# Patient Record
Sex: Female | Born: 1935 | Race: White | Hispanic: No | Smoking: Former smoker
Health system: Southern US, Community
[De-identification: ages and names within clinical notes are randomized; demographics above are authoritative.]

## PROBLEM LIST (undated history)

## (undated) DIAGNOSIS — F32A Depression, unspecified: Secondary | ICD-10-CM

## (undated) DIAGNOSIS — G43909 Migraine, unspecified, not intractable, without status migrainosus: Secondary | ICD-10-CM

## (undated) DIAGNOSIS — K5730 Diverticulosis of large intestine without perforation or abscess without bleeding: Secondary | ICD-10-CM

## (undated) DIAGNOSIS — R110 Nausea: Secondary | ICD-10-CM

## (undated) DIAGNOSIS — B880 Other acariasis: Secondary | ICD-10-CM

## (undated) DIAGNOSIS — F329 Major depressive disorder, single episode, unspecified: Secondary | ICD-10-CM

## (undated) DIAGNOSIS — I493 Ventricular premature depolarization: Secondary | ICD-10-CM

## (undated) DIAGNOSIS — H269 Unspecified cataract: Secondary | ICD-10-CM

## (undated) DIAGNOSIS — G609 Hereditary and idiopathic neuropathy, unspecified: Secondary | ICD-10-CM

## (undated) DIAGNOSIS — IMO0002 Reserved for concepts with insufficient information to code with codable children: Secondary | ICD-10-CM

## (undated) DIAGNOSIS — E669 Obesity, unspecified: Secondary | ICD-10-CM

## (undated) DIAGNOSIS — I4891 Unspecified atrial fibrillation: Secondary | ICD-10-CM

## (undated) DIAGNOSIS — M1710 Unilateral primary osteoarthritis, unspecified knee: Secondary | ICD-10-CM

## (undated) DIAGNOSIS — Z8711 Personal history of peptic ulcer disease: Secondary | ICD-10-CM

## (undated) DIAGNOSIS — R002 Palpitations: Secondary | ICD-10-CM

## (undated) DIAGNOSIS — I10 Essential (primary) hypertension: Secondary | ICD-10-CM

## (undated) DIAGNOSIS — G4700 Insomnia, unspecified: Secondary | ICD-10-CM

## (undated) DIAGNOSIS — R109 Unspecified abdominal pain: Secondary | ICD-10-CM

## (undated) DIAGNOSIS — N390 Urinary tract infection, site not specified: Secondary | ICD-10-CM

## (undated) DIAGNOSIS — K922 Gastrointestinal hemorrhage, unspecified: Secondary | ICD-10-CM

## (undated) DIAGNOSIS — M1990 Unspecified osteoarthritis, unspecified site: Secondary | ICD-10-CM

## (undated) DIAGNOSIS — R269 Unspecified abnormalities of gait and mobility: Secondary | ICD-10-CM

## (undated) DIAGNOSIS — R609 Edema, unspecified: Secondary | ICD-10-CM

## (undated) DIAGNOSIS — Z8719 Personal history of other diseases of the digestive system: Secondary | ICD-10-CM

## (undated) DIAGNOSIS — F1020 Alcohol dependence, uncomplicated: Secondary | ICD-10-CM

## (undated) HISTORY — DX: Insomnia, unspecified: G47.00

## (undated) HISTORY — DX: Edema, unspecified: R60.9

## (undated) HISTORY — DX: Gastrointestinal hemorrhage, unspecified: K92.2

## (undated) HISTORY — PX: OTHER SURGICAL HISTORY: SHX169

## (undated) HISTORY — DX: Unspecified abnormalities of gait and mobility: R26.9

## (undated) HISTORY — DX: Hereditary and idiopathic neuropathy, unspecified: G60.9

## (undated) HISTORY — DX: Palpitations: R00.2

## (undated) HISTORY — DX: Unilateral primary osteoarthritis, unspecified knee: M17.10

## (undated) HISTORY — DX: Obesity, unspecified: E66.9

## (undated) HISTORY — DX: Reserved for concepts with insufficient information to code with codable children: IMO0002

## (undated) HISTORY — DX: Depression, unspecified: F32.A

## (undated) HISTORY — DX: Major depressive disorder, single episode, unspecified: F32.9

## (undated) HISTORY — DX: Nausea: R11.0

## (undated) HISTORY — DX: Diverticulosis of large intestine without perforation or abscess without bleeding: K57.30

## (undated) HISTORY — PX: ARTHROSCOPIC REPAIR ACL: SUR80

## (undated) HISTORY — DX: Alcohol dependence, uncomplicated: F10.20

## (undated) HISTORY — DX: Unspecified abdominal pain: R10.9

---
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
## 1971-09-24 HISTORY — PX: BREAST BIOPSY: SHX20

## 2001-09-09 ENCOUNTER — Encounter: Admission: RE | Admit: 2001-09-09 | Discharge: 2001-09-09 | Payer: Self-pay | Admitting: Internal Medicine

## 2001-09-09 ENCOUNTER — Encounter: Payer: Self-pay | Admitting: Internal Medicine

## 2002-06-23 DIAGNOSIS — K922 Gastrointestinal hemorrhage, unspecified: Secondary | ICD-10-CM

## 2002-06-23 HISTORY — DX: Gastrointestinal hemorrhage, unspecified: K92.2

## 2002-07-17 ENCOUNTER — Inpatient Hospital Stay (HOSPITAL_COMMUNITY): Admission: EM | Admit: 2002-07-17 | Discharge: 2002-07-22 | Payer: Self-pay

## 2002-07-24 ENCOUNTER — Inpatient Hospital Stay (HOSPITAL_COMMUNITY): Admission: EM | Admit: 2002-07-24 | Discharge: 2002-07-27 | Payer: Self-pay | Admitting: Emergency Medicine

## 2002-07-27 ENCOUNTER — Encounter: Payer: Self-pay | Admitting: Cardiology

## 2002-10-14 ENCOUNTER — Encounter: Admission: RE | Admit: 2002-10-14 | Discharge: 2002-10-14 | Payer: Self-pay | Admitting: Internal Medicine

## 2002-10-14 ENCOUNTER — Encounter: Payer: Self-pay | Admitting: Internal Medicine

## 2002-10-21 ENCOUNTER — Ambulatory Visit (HOSPITAL_COMMUNITY): Admission: RE | Admit: 2002-10-21 | Discharge: 2002-10-21 | Payer: Self-pay | Admitting: Gastroenterology

## 2002-12-20 ENCOUNTER — Ambulatory Visit (HOSPITAL_COMMUNITY): Admission: RE | Admit: 2002-12-20 | Discharge: 2002-12-20 | Payer: Self-pay | Admitting: Specialist

## 2003-09-24 HISTORY — PX: CHOLECYSTECTOMY: SHX55

## 2003-11-01 ENCOUNTER — Encounter: Admission: RE | Admit: 2003-11-01 | Discharge: 2003-11-01 | Payer: Self-pay

## 2003-12-23 HISTORY — PX: BREAST REDUCTION SURGERY: SHX8

## 2004-10-11 ENCOUNTER — Observation Stay (HOSPITAL_COMMUNITY): Admission: RE | Admit: 2004-10-11 | Discharge: 2004-10-12 | Payer: Self-pay | Admitting: Surgery

## 2004-10-11 ENCOUNTER — Encounter (INDEPENDENT_AMBULATORY_CARE_PROVIDER_SITE_OTHER): Payer: Self-pay | Admitting: *Deleted

## 2004-11-01 ENCOUNTER — Encounter: Admission: RE | Admit: 2004-11-01 | Discharge: 2004-11-01 | Payer: Self-pay | Admitting: Surgery

## 2004-11-02 ENCOUNTER — Encounter: Admission: RE | Admit: 2004-11-02 | Discharge: 2004-11-02 | Payer: Self-pay | Admitting: Surgery

## 2004-12-21 ENCOUNTER — Encounter: Admission: RE | Admit: 2004-12-21 | Discharge: 2004-12-21 | Payer: Self-pay | Admitting: Internal Medicine

## 2005-11-04 ENCOUNTER — Encounter: Admission: RE | Admit: 2005-11-04 | Discharge: 2005-11-04 | Payer: Self-pay | Admitting: Family Medicine

## 2006-11-16 ENCOUNTER — Emergency Department (HOSPITAL_COMMUNITY): Admission: EM | Admit: 2006-11-16 | Discharge: 2006-11-16 | Payer: Self-pay | Admitting: Emergency Medicine

## 2008-10-07 ENCOUNTER — Encounter: Admission: RE | Admit: 2008-10-07 | Discharge: 2008-10-07 | Payer: Self-pay | Admitting: Family Medicine

## 2009-10-16 ENCOUNTER — Encounter: Admission: RE | Admit: 2009-10-16 | Discharge: 2009-10-16 | Payer: Self-pay | Admitting: Family Medicine

## 2010-11-14 ENCOUNTER — Other Ambulatory Visit: Payer: Self-pay | Admitting: Internal Medicine

## 2010-11-14 DIAGNOSIS — Z1231 Encounter for screening mammogram for malignant neoplasm of breast: Secondary | ICD-10-CM

## 2010-11-29 ENCOUNTER — Ambulatory Visit
Admission: RE | Admit: 2010-11-29 | Discharge: 2010-11-29 | Disposition: A | Discharge status: Home / Self Care | Payer: Medicare Other | Source: Ambulatory Visit | Attending: Internal Medicine | Admitting: Internal Medicine

## 2010-11-29 DIAGNOSIS — Z1231 Encounter for screening mammogram for malignant neoplasm of breast: Secondary | ICD-10-CM

## 2011-02-04 ENCOUNTER — Other Ambulatory Visit: Payer: Self-pay | Admitting: Otolaryngology

## 2011-02-04 ENCOUNTER — Ambulatory Visit
Admission: RE | Admit: 2011-02-04 | Discharge: 2011-02-04 | Disposition: A | Discharge status: Home / Self Care | Payer: Medicare Other | Source: Ambulatory Visit | Attending: Otolaryngology | Admitting: Otolaryngology

## 2011-02-04 DIAGNOSIS — R05 Cough: Secondary | ICD-10-CM

## 2011-02-08 NOTE — Discharge Summary (Signed)
NAMEHUMA, [name]                             ACCOUNT NO.:  [id number]   MEDICAL RECORD NO.:  [id number]                   PATIENT TYPE:  INP   LOCATION:  5739                                 FACILITY:  [facility]   PHYSICIAN:  Danise Edge, M.D.                DATE OF BIRTH:  [birth year]   DATE OF ADMISSION:  07/17/2002  DATE OF DISCHARGE:  07/22/2002                                 DISCHARGE SUMMARY   DISCHARGE DIAGNOSES:  1. Gastric bleeding due to a Dieulafoy lesion in the gastric body.  2. Multiple gastric ulcers (CLOtest negative) secondary to nonsteroidal anti-     inflammatory drugs.  3. Hypertension.   DISCHARGE MEDICATIONS:  1. Hydrochlorothiazide and Cardizem are on hold.  2. Mobic and aspirin were discontinued due to ulcer disease.  3. Protonix 40 mg each morning for 12 weeks.  4. Tylenol 100 mg q.6h. p.r.n. arthralgias.   FOLLOW-UP:  I will ask the patient to see me back in the office in two to  four weeks.   LABORATORY DATA:  Discharge hemoglobin 10.5 g, white blood cell count 7700,  platelet count normal.  Complete metabolic profile on admission normal  except for a serum albumin 3.3.  Helicobacter pylori screen negative.   HISTORY OF PRESENT ILLNESS:  The patient is a 77 year old female born  2032-11-17.  The patient's primary care physician is Thora Lance, M.D.,  with the [facility].   The patient was evaluated by Colen Darling. Aundria Rud, M.D., at the urgent medical  care center with upper gastrointestinal bleeding.  She was referred to the  [facility] emergency room for evaluation.   HOSPITAL COURSE:  The patient required a total of six units of packed red  blood cells during her hospitalization due to recurrent bleeding.  Her  multiple esophagogastroduodenoscopies have revealed two prepyloric antral  ulcers at low risk for rebleeding, a proximal gastric body ulcer at low risk  for rebleeding, and a Dieulafoy lesion in the distal gastric  body, which was  endoclipped to control bleeding.  CLOtest was negative for Helicobacter  pylori antral gastritis.   At discharge the patient is in stable medical condition.  She may require  reintroduction of her Cardizem and hydrochlorothiazide for hypertension, but  at present her blood pressure is normal.  She does perform home blood  pressure monitoring.  The patient will try Tylenol to control her chronic  low back pain but may require narcotics in the future.  I told her that the  risks of nonsteroidal anti-inflammatory drugs outweigh any benefits in her  case.                                               Danise Edge, M.D.  MJ/MEDQ  D:  07/22/2002  T:  07/22/2002  Job:  [id number]   cc:   Everardo All. Madilyn Fireman, M.D.  [address]  [place]  [place] [address]  Fax: [phone]   Adolph Pollack, M.D.  Fax: [phone]   Colen Darling. Charmayne Sheer., M.D.  [address]  [place]  [place] [address]  Fax: [phone]

## 2011-02-08 NOTE — Op Note (Signed)
NAMEANALUCIA, [name]                             ACCOUNT NO.:  [id number]   MEDICAL RECORD NO.:  [id number]                   PATIENT TYPE:  INP   LOCATION:  5739                                 FACILITY:  [facility]   PHYSICIAN:  Danise Edge, M.D.                DATE OF BIRTH:  May 29, 1933   DATE OF PROCEDURE:  07/21/2002  DATE OF DISCHARGE:                                 OPERATIVE REPORT   PROCEDURE PERFORMED:  Esophagogastroduodenoscopy with gastric biopsies.   ENDOSCOPIST:  Charolett Bumpers, M.D.   INDICATIONS FOR PROCEDURE:  The patient is a 77 year old female born 1933-07-31.  [name] has gastric bleeding of undetermined etiology.   PROCEDURE PERFORMED:  Esophagogastroduodenoscopy.   ENDOSCOPIST:  Charolett Bumpers, M.D.   PREMEDICATION:  Versed 12.5 mg and Demerol 100 mg.  The patient received 0.3  mg Romazicon and 0.4 mg Narcan when she developed hypoventilation with a  falling oxygen saturation and hypotension.  Her oxygen saturation fell to  approximately 70% before coming back to 95 to 100% after the administration  of Romazicon, Narcan and placing a 100% rebreathing mask.   DESCRIPTION OF PROCEDURE:  The patient was placed in the left lateral  decubitus position.  I administered intravenous Versed and Demerol to  achieve conscious sedation for the procedure.  Due to oversedation, Ms.  Kimoto required 0.3 mg Romazicon and 0.4 mg Narcan.   The Olympus gastroscope was passed through the posterior hypopharynx and the  proximal esophagus without difficulty.  The hypopharynx, larynx and vocal  cords appeared normal.   Esophagoscopy:  The proximal, mid and lower segments of the esophagus  appeared normal.   Gastroscopy:  Upon entering the stomach there is absolutely no blood in the  stomach.  Retroflex view of the gastric cardia and fundus was completely  normal.  There is a healing, nonbleeding ulcer in the proximal gastric body  on the posterior wall with  exudative base.  There is a probable Dieulafoy  lesion in the distal gastric body on the anterior wall which was endoclipped  a couple of days ago.  The Endoclip remained in place and a second Endoclip  was applied.  There are multiple erosions in the gastric antrum which are  not bleeding.  There are two healing prepyloric antral ulcers with exudative  bases.   Duodenoscopy:  The duodenal bulb, mid duodenum and distal duodenum appear  normal.    ASSESSMENT:  Gastric bleeding due to a probable Dieulafoy lesion in the  distal gastric body on the anterior wall which has been endoclipped twice to  achieve hemostasis.  Danise Edge, M.D.    MJ/MEDQ  D:  07/21/2002  T:  07/21/2002  Job:  [id number]

## 2011-02-08 NOTE — Op Note (Signed)
[name], [name]                             ACCOUNT NO.:  [id number]   MEDICAL RECORD NO.:  [id number]                   PATIENT TYPE:  INP   LOCATION:  2315                                 FACILITY:  [facility]   PHYSICIAN:  Danise Edge, M.D.                DATE OF BIRTH:  October 15, 1932   DATE OF PROCEDURE:  07/19/2002  DATE OF DISCHARGE:                                 OPERATIVE REPORT   PROCEDURE PERFORMED:  Emergency esophagogastroduodenoscopy.   ENDOSCOPIST:  Danise Edge, M.D.   INDICATIONS FOR PROCEDURE:  The patient is a 77 year old  female admitted to  the hospital July 17, 2002 with gastric bleeding, site undetermined.   PREMEDICATION:  Fentanyl 50 mcg and Versed 14 mg.   INSTRUMENT USED:  Olympus therapeutic gastroscope.   DESCRIPTION OF PROCEDURE:  After obtaining informed consent the patient was  placed in the left lateral decubitus position.  I administered intravenous  Versed and intravenous fentanyl to achieve conscious sedation for the  procedure.  The patient's blood pressure, oxygen saturation and cardiac  rhythm were monitored throughout the procedure and documented in the medical  record.   The Olympus gastroscope was passed through the posterior hypopharynx and  into the proximal esophagus without difficulty.  The hypopharynx, larynx and  vocal cords appeared normal.   Esophagoscopy:  The proximal, mid and lower segments of the esophagus  appeared normal.   Gastroscopy:  Upon entering the stomach the gastric body and fundus were  filled with blood and blood clots.  I was able to examine the gastric  antrum, which revealed two nonbleeding prepyloric antral ulcers with  exudative bases.  The pylorus appeared normal.   Duodenoscopy:  The duodenal bulb, mid duodenum and distal duodenum appeared  normal.   Gastric lavage:  Using the Ewald tube the stomach was lavaged with  approximately 5,000 cc of water in an attempt to try to clear the clots from  the stomach.  Following orogastric lavage the therapeutic gastroscope was  passed through the posterior hypopharynx down the esophagus and into the  stomach, again revealing a large amount of blood clots in the gastric fundus  making this area difficult to clear of any active bleeding.  The proximal  gastric body ulcer on the posterior wall was visualized and was not  bleeding.  There appeared to be a pigmented mucosal depression in the distal  gastric body on the greater curvature aspect. This would be compatible with  a possible Dieulafoy type of lesion.  It was injected with 1:10,000  epinephrine.  It was not an area to adequately Endoclip for hemostasis, but  I did Endoclip in the area of the apparent Dieulafoy for localization if she  should require surgery.   ASSESSMENT:  1. Stomach filled with blood and blood clots; unable to adequately visualize     the fundus.  2. Two nonbleeding prepyloric antral  ulcers with exudate and low risk of     bleeding.  3. Nonbleeding, proximal gastric body, posterior wall, endoclipped to mark     location; difficult area to Endoclip for hemostasis.  4. Questionable Dieulafoy type lesion, distal gastric body, greater     curvature aspect injected with epinephrine and Endoclipped to mark     location, but a difficult area to Endoclip for hemostasis.   PLAN:  1. Monitor in intensive care unit.  2. IV Protonix.  3. Transfuse two units of packed red blood cells.  4. Monitor CBC.  5. Surgical consultation.  6. If bleeding continues recommend surgical therapy.                                                 Danise Edge, M.D.    MJ/MEDQ  D:  07/19/2002  T:  07/19/2002  Job:  [id number]

## 2011-02-08 NOTE — Op Note (Signed)
   [name], [name]                             ACCOUNT NO.:  [id number]   MEDICAL RECORD NO.:  [id number]                   PATIENT TYPE:  AMB   LOCATION:  ENDO                                 FACILITY:  [facility]   PHYSICIAN:  Danise Edge, M.D.                DATE OF BIRTH:  13-Oct-1932   DATE OF PROCEDURE:  10/21/2002  DATE OF DISCHARGE:                                 OPERATIVE REPORT   PROCEDURE:  EGD.   INDICATIONS FOR PROCEDURE:  The patient is a 77 year old female.  In late  October and early November the patient required two hospitalizations to  manage bleeding gastric ulcers secondary to nonsteroidal anti-inflammatory  medication.  She has completed a course of proton pump inhibitor therapy and  is scheduled to undergo followup esophagogastroduodenoscopy to confirm  gastric ulcer healing.   ENDOSCOPIST:  Danise Edge, M.D.   PREMEDICATION:  Versed 7.5 mg, Demerol 50 mg.   ENDOSCOPE:  Olympus gastroscope.   PROCEDURE IN DETAIL:  After obtaining informed consent the patient was  placed in the left lateral decubitus position.  I administered intravenous  Demerol and intravenous Versed to achieve conscious sedation for the  procedure.  The patient's blood pressure, oxygen saturation and cardiac  rhythm were monitored throughout the procedure and documented in the medical  record.   The Olympus gastroscope was passed through the posterior hypopharynx and  proximal esophagus without difficulty.  The hypopharynx and larynx appeared  normal.  I did not visualize the vocal cords during this exam.   Esophagoscopy:  The proximal mid and lower segments of the esophagus appear  normal.   Gastroscopy:  Retroflexed view of the gastric cardia and fundus were normal.  The gastric body, antrum and pylorus appear normal.  The gastric ulcers have  healed.  There is an Endoclip in the distal gastric body greater curvature  aspect from her late November esophagogastroduodenoscopy to  manage bleeding.  This Endoclip was applied to a suspected Dieulafoy type lesion.   Duodenoscopy:  The duodenal bulb, mid duodenum and distal duodenum appeared  normal.   ASSESSMENT:  Normal esophagogastroduodenoscopy.  Endoclip remains in the  distal gastric body greater curvature aspect.  Healed gastric ulcers.   RECOMMENDATIONS:  Remain off nonsteroidal anti-inflammatory medication.                                              Danise Edge, M.D.     MJ/MEDQ  D:  10/21/2002  T:  10/21/2002  Job:  [id number]   cc:   Thora Lance, M.D.  [address]  [place]  [place] [address]  Fax: [phone]

## 2011-02-08 NOTE — H&P (Signed)
[name], [name]                             ACCOUNT NO.:  [id number]   MEDICAL RECORD NO.:  [id number]                   PATIENT TYPE:  INP   LOCATION:  4702                                 FACILITY:  [facility]   PHYSICIAN:  Althea Grimmer. Luther Parody, M.D.            DATE OF BIRTH:  [birth year]   DATE OF ADMISSION:  07/24/2002  DATE OF DISCHARGE:                                HISTORY & PHYSICAL   HISTORY OF PRESENT ILLNESS:  The patient is a 77 year old female just  discharged from the hospital two days ago after an episode of upper  gastrointestinal bleeding.  She had three endoscopies while hospitalized for  bleeding of unclear etiology.  Findings on the third endoscopy revealed that  she had a nonbleeding ulcer in the proximal gastric body on the posterior  wall.  There is also a probable Dieulafoy in the distal gastric body on the  anterior wall which had been Endoclipped and there were two prepyloric  antral ulcers, all lesions appeared to be healing.   At the third endoscopy, the Dieulafoy was reclipped, even though it did not  appear to be bleeding.  CLO-test was negative.  The patient had been taking  aspirin and Mobic.   She felt well on discharge with a discharge hemoglobin of 10.5.  This  morning, she had two loose black stools with lower abdominal cramping.  She  has had no vomiting.  She feels extremely lightheaded, like she will pass  out.   In the emergency room, she was not orthostatic but her pulse was tachycardic  and rose from 105 to 124 on standing.  The patient had a prior bleeding  duodenal ulcer in 1963, and prior to her admission on July 17, 2002, she  was on aspirin and Mobic.  She also has a history of colon polyps.  Apparently, baseline hemoglobin was over 17.   CURRENT MEDICATIONS:  Hydrochlorothiazide and Cardizem which have been on  hold.  Aspirin and Mobic have been discontinued.  Thus, she is taking  nothing other than Protonix daily.   ALLERGIES:   None reported.   PAST MEDICAL HISTORY:  Pertinent for arthritis, hypertension, colonic polyp,  and bleeding duodenal ulcer.   PAST SURGICAL HISTORY:  None.   FAMILY HISTORY:  Noncontributory.   SOCIAL HISTORY:  Former smoker. Minimal social drinker.  Attentive family.   REVIEW OF SYMPTOMS:  GENERAL:  No weight loss or night sweats.  ENDOCRINE:  No history of diabetes or thyroid problems.  SKIN:  No rash or pruritus.  EYES:  No icterus or change in vision.  ENT:  No aphthous, ulcers, or  chronic sore throat.  RESPIRATORY:  No shortness of breath, cough, or  wheezing.  CARDIAC:  No chest pain, palpitations, or history of valvular  heart disease.  GASTROINTESTINAL:  As above.  GENITOURINARY:  No dysuria or  hematuria.  Remainder of review of  systems is negative.   PHYSICAL EXAMINATION:  GENERAL:  She is a well-developed, well-nourished  adult female in no acute distress.  VITAL SIGNS:  Afebrile.  Pulse 110.  Blood pressure 105/80.  Afebrile.  SKIN:  Normal.  HEENT:  Eyes are anicteric.  Conjunctivae are somewhat pale.  Oropharynx  unremarkable.  NECK:  Supple without thy.  There is no cervical or inguinal adenopathy.  HEART:  Regular rate and rhythm without murmurs, rubs, or gallops.  Milt  tachycardia.  CHEST:  Clear.  ABDOMEN:  Soft without mass, tenderness, or organomegaly..  There is no  rebound or hernia.  Bowel sounds are normal.  RECTAL:  Not performed.  EXTREMITIES:  Without clubbing, cyanosis, or edema.   IMPRESSION:  Recurrent significant upper gastrointestinal bleeding in a 20-  year-old female whose hemoglobin is 6.7 in the face of melena.  She likely  has a recurrent bleeding from ulcer disease or the Dieulafoy.   PLAN:  Protonix drip and urgent endoscopy for attempted therapy is in order.  If active bleeding is still occurring or difficult to manage, surgical  consultation is in order.  Please see the orders.                                               Althea Grimmer. Luther Parody, M.D.    PJS/MEDQ  D:  07/24/2002  T:  07/25/2002  Job:  [id number]   cc:   Danise Edge, M.D.  [address]  [place]  [place] [address]  Fax: [phone]   Thora Lance, M.D.  [address] [place]  [place] [address]  Fax: [phone]

## 2011-02-08 NOTE — H&P (Signed)
   NAMECHELSEY, [name]                             ACCOUNT NO.:  [id number]   MEDICAL RECORD NO.:  [id number]                   PATIENT TYPE:  INP   LOCATION:  3703                                 FACILITY:  [facility]   PHYSICIAN:  Danise Edge, M.D.                DATE OF BIRTH:  [birth year]   DATE OF ADMISSION:  07/17/2002  DATE OF DISCHARGE:                                HISTORY & PHYSICAL   PROBLEM:  Upper gastrointestinal bleeding.   HISTORY OF PRESENT ILLNESS:  The patient is a 77 year old female born [birth year]. The patient's primary care physician is Dr. Kirby Funk with the  [facility].   The patient was  evaluated by Dr. Zenon Mayo at the [facility] with signs of upper gastrointestinal bleeding manifested by the  passage of melenic stool for approximately 48 hours associated with a  dyspeptic type abdominal discomfort.   In 1963, the patient was  hospitalized at [facility]  with a bleeding duodenal ulcer which required blood transfusions but no  surgery.   The patient takes daily aspirin and Mobic. She denies vomiting, hematemesis  or hematochezia.   Approximately three years ago she underwent a colonoscopy performed in [place], [place], and a colon polyp was removed.  The patient tells me she  had her hemoglobin checked last week and it was 17.5 gm.   MEDICATION ALLERGIES:  NONE.   CHRONIC MEDICATIONS:  1. Hydrochlorothiazide.  2. Aspirin 81 mg.  3. Mobic.  4. Cardizem.   PAST MEDICAL HISTORY:  1. Arthritis.  2. Hypertension.  3. Colonoscopy with polypectomy.  4. Bleeding duodenal ulcer in 1963.   PAST SURGICAL HISTORY:  None.   FAMILY HISTORY:  Noncontributory.   HABITS:  The patient quit smoking cigarettes years ago.   PHYSICAL EXAMINATION:  GENERAL APPEARANCE:  The patient is alert and appears  to be in no distress. VITAL SIGNS:  She does have a 20 mm systolic fall in  her blood  pressure going from supine to sitting position.  HEENT:  Sclerae nonicteric.  Oropharynx normal.  LUNGS:  Clear to auscultation.  CARDIAC:  Regular rhythm without  audible murmurs.  ABDOMEN:  Soft, flat and nontender.  EXTREMITIES:  No edema.   ASSESSMENT:  Upper gastrointestinal bleeding.   PLAN:  Emergency esophagogastroduodenoscopy.                                                Danise Edge, M.D.    MJ/MEDQ  D:  07/17/2002  T:  07/19/2002  Job:  [id number]

## 2011-02-08 NOTE — Discharge Summary (Signed)
   [name], [name]                             ACCOUNT NO.:  [id number]   MEDICAL RECORD NO.:  [id number]                   PATIENT TYPE:  INP   LOCATION:  2009                                 FACILITY:  [facility]   PHYSICIAN:  Danise Edge, M.D.                DATE OF BIRTH:  Dec 19, 1932   DATE OF ADMISSION:  07/24/2002  DATE OF DISCHARGE:  07/27/2002                                 DISCHARGE SUMMARY   DISCHARGE DIAGNOSIS:  Recurrent bleeding from gastric ulcer.   LABORATORY DATA:  Discharge white blood cell count 7,200, discharge  hemoglobin 10.1 grams, platelet count normal.  Prothrombin time normal.  Cardiac enzymes negative for myocardial infarction or myocardial injury.   HISTORY:  Ms. [name] is a 77 year old female born April 01, 1933.  Ms.  [name] was discharged from [facility] 48 hours prior to readmission  to manage gastric bleeding secondary to peptic ulcer disease.  She was  discharged in stable medical condition, only to be readmitted 48 hours post  discharge by Dr. Roosvelt Harps with recurrent upper gastrointestinal  bleeding from a visible vessel in one of her gastric ulcers which was  endoclipped.  Following Dr. Joanette Gula endoscopic therapy, Ms. [name] had  no further gastrointestinal bleeding.   She did have an episode of anterior chest pain during her hospitalization  which was evaluated by Dr. Viann Fish.  Her cardiac enzymes were  negative.  She did have an adenosine Cardiolite test performed prior to  discharge which revealed no ischemia.   Ms. [name] was discharged in stable medical condition, to be followed up by  me as an outpatient.  Her CLO test was negative for Helicobacter pylori  antral gastritis.  The etiology of her bleeding gastric ulcers was felt to  be due to nonsteroidal anti-inflammatory drugs.                                               Danise Edge, M.D.    MJ/MEDQ  D:  09/08/2002  T:  09/09/2002  Job:   [id number]

## 2011-02-08 NOTE — Op Note (Signed)
   [name], [name]                             ACCOUNT NO.:  [id number]   MEDICAL RECORD NO.:  [id number]                   PATIENT TYPE:  INP   LOCATION:  3703                                 FACILITY:  [facility]   PHYSICIAN:  Danise Edge, M.D.                DATE OF BIRTH:  23-Mar-1933   DATE OF PROCEDURE:  DATE OF DISCHARGE:                                 OPERATIVE REPORT   PROCEDURE:  Esophagogastroduodenoscopy.   After obtaining informed consent the patient was placed in the left lateral  decubitus position.  I administered 10 mg of intravenous Versed and 50 mg of  intravenous Demerol to achieve conscious sedation for the procedure.  The  patient's blood pressure, oxygen saturation and cardiac rhythm were  monitored throughout the procedure and documented in the medical record.   The Olympus gastroscope was passed through the posterior hypopharynx and the  proximal esophagus without difficulty.  The hypopharynx, larynx and focal  cords appeared normal.   Esophagoscopy:  The proximal, mid and lower segments of the esophagus  appeared normal.   Gastroscopy:  Upon entering the proximal stomach there is coffee ground  liquid and blood clots filling the gastric fundus.  The coffee ground liquid  could be evacuated, but I could not adequately remove the blood clots and  visualize the gastric fundus.  Retroflex view of the gastric cardia was  normal.  There is a 2 mm x 2 mm ulcer on the greater curvature aspect of the  gastric body, (posterior wall); the ulcer base is exudative and not  bleeding.  The remainder of the gastric body appeared normal.  There are  kissing ulcers in the prepyloric antrum.  Both ulcers measure approximately  2 mm x 2 mm.  Both ulcers are exudative and nonbleeding.  The pyloric  channel is normal.   Duodenoscopy:  The duodenal bulb, mid duodenum and distal duodenum appeared  normal.   Biopsy:  A biopsy was taken from the distal gastric antrum for CLO  test to  rule out Helicobacter pylori antral gastritis.   ASSESSMENT:  1. Upper gastrointestinal bleeding secondary to 2 prepyloric antral ulcers     and an ulcer in the proximal gastric body, posterior wall greater     curvature aspect.  Due to the presence of blood clots and the gastric     fundus I could not adequately visualize this area.                                               Danise Edge, M.D.    MJ/MEDQ  D:  07/17/2002  T:  07/19/2002  Job:  [id number]

## 2011-02-08 NOTE — Op Note (Signed)
NAMESTEFANIA, GOULART                 ACCOUNT NO.:  [id number]   MEDICAL RECORD NO.:  [id number]          PATIENT TYPE:  AMB   LOCATION:  DAY                          FACILITY:  [facility]   PHYSICIAN:  Velora Heckler, MD      DATE OF BIRTH:  [birth year]   DATE OF PROCEDURE:  10/11/2004  DATE OF DISCHARGE:                                 OPERATIVE REPORT   PREOPERATIVE DIAGNOSIS:  Symptomatic cholelithiasis.   POSTOPERATIVE DIAGNOSIS:  Chronic cholecystitis, cholelithiasis.   PROCEDURE:  Laparoscopic cholecystectomy with intraoperative  cholangiography.   SURGEON:  Velora Heckler, M.D.   ASSISTANTS:  Cyndia Bent, M.D.   ANESTHESIA:  General.   ESTIMATED BLOOD LOSS:  Minimal.   PREPARATION:  Betadine.   COMPLICATIONS:  None.   INDICATIONS:  Patient is a 77 year old white female who presents with a  history of intermittent upper abdominal discomfort, gaseous distention, and  nausea.  She had been evaluated at [facility] in [place] in  September, 2005.  She was found to have cholelithiasis.  She now comes to  surgery for cholecystectomy.   Procedure is done in OR #1 at [facility].  The  patient was brought to the operating room, placed in a supine position on  the operating room table.  Following administration of general anesthesia,  the patient was prepped and draped in the usual strict aseptic fashion.  After ascertaining that an adequate level of anesthesia had been obtained,  an infraumbilical incision was made with a #15 blade.  Dissection was  carried down to the fascia.  Fascia was incised in the midline and the  peritoneal cavity is entered cautiously.  A 0 Vicryl pursestring suture is  placed in the fascia.  A Hasson cannula is introduced and secured with a  pursestring suture.  The abdomen was insufflated with carbon dioxide.  Laparoscope was introduced and the abdomen explored.  Operative ports were  placed along the right costal  margin in the midline, midclavicular line, and  anterior axillary line.  Fundus of the gallbladder was grasped and retracted  cephalad.  There are dense adhesions of the omentum and duodenum to the  undersurface of the gallbladder.  These were gently taken down and  hemostasis obtained with the electrocautery.  Neck of the gallbladder is  exposed.  Peritoneum is incised.  Cystic duct is dissected out along its  length and a clip is placed at the neck of the gallbladder.  Cystic duct is  incised and clear yellow bile emanates from the cystic duct.  A Reddick  cholangiography catheter is introduced through an Angiocath into the right  upper quadrant.  It is inserted into the cystic duct and the balloon  inflated.  Using C-arm fluoroscopy, real time cholangiography is performed.  There is rapid flow of contrast distally in the common bile duct through the  duodenum without filling defect or obstruction.  Upon deflating the ballon  there was reflux of contrast into the common hepatic duct.  However, there  is also leakage out a long cystic duct.  The cholangiogram is discontinued  at this point.  Catheter is withdrawn.  Cystic duct is triply clipped and  divided.  Branches of the cystic artery are divided between Ligaclips.  Gallbladder is then excised from the gallbladder bed using the hook  electrocautery for hemostasis.  Gallbladder is completely extracted through  the umbilical port without difficulty.  It contains large gallstones.  Good  hemostasis is noted in the right upper quadrant.  The abdomen is irrigated  with warm saline which is evacuated.  Pneumoperitoneum is released and ports  are removed under direct vision.  Pursestring sutures of 0 Vicryl tied  securely at the umbilicus.  All wounds are anesthetized with local  anesthetic.  All wounds are closed with interrupted 4-0 Vicryl subcuticular  sutures.  Wounds are washed and dried and Benzoin and Steri-Strips are  applied.   Sterile dressings are applied.  The patient is awakened from  anesthesia and brought to the recovery room in stable condition.  Patient  tolerated the procedure well.     Todd   TMG/MEDQ  D:  10/11/2004  T:  10/11/2004  Job:  [id number]   cc:   Velora Heckler, MD  [address] [place]  [place] [address]  Fax: [phone]   Sheran Fava. Arva Chafe, M.D.  [address]  [place], [place] [address]   Thora Lance, M.D.  [address] [place]  [place] [address]  Fax: [phone]   Danise Edge, M.D.  [address]  [place]  [place] [address]  Fax: [phone]

## 2011-02-08 NOTE — Consult Note (Signed)
NAMESHEREDA, GRAW                             ACCOUNT NO.:  [id number]   MEDICAL RECORD NO.:  [id number]                   PATIENT TYPE:  INP   LOCATION:  2009                                 FACILITY:  [facility]   PHYSICIAN:  W. Ashley Royalty., M.D.         DATE OF BIRTH:  1933-04-10   DATE OF CONSULTATION:  07/26/2002  DATE OF DISCHARGE:                                   CONSULTATION   Thank you for asking me to see this very nice 77 year old female for  cardiologist opinion regarding chest discomfort.  The patient has a prior  history of hypertension and has had recent evaluation of gastrointestinal  bleeding.  She developed GI bleeding on July 15, 2002, and was  hospitalized here on October 25.  She has undergone a total of three  endoscopies and has been found to have gastric ulcers thought due to aspirin  and Mobic.  She was discharged home, but was readmitted with severe fatigue,  malaise, and further melena on July 24, 2002.  She required additional  transfusions over the weekend.  Last evening, she was standing up and felt a  feeling of impended doom, lightheadedness, dizziness, and developed anterior  chest pressure as someone sitting on her chest.  She also had palpitations  and felt as if she had PVCs.  She had an EKG showing no acute changes and  was given a total of two nitroglycerin which resulted in relief of her  symptoms, but she developed flushing and weakness associated with it. She  developed a significant headache following this.  Dr. Laural Benes asked me to  see her today and she has not had any recurrence of chest discomfort or  symptoms.   PAST MEDICAL HISTORY:  Remarkable for hypertension for several years. There  is no history of diabetes. She states that her cholesterol is normal.  Previous surgery; arthroscopy of the left knee in 1995, breast biopsy in  1973, and tonsillectomy in 1941.  Colonoscopy in 2001.   ALLERGIES:  No known drug allergies.   MEDICATIONS:  1. Protonix 40 daily.  2. Zoloft 50 daily.  3. Cardizem LA 420 daily.  4. Hydrochlorothiazide 25 daily.  5. Tylenol p.r.n.   Currently is on IV Protonix, she is not receiving an Diltiazem.   FAMILY HISTORY:  Father died of congestive heart failure in his 73s. Mother  died of breast cancer in her 3s. Sister died accidentally at age 56.  There  is no history of premature cardiac disease.   SOCIAL HISTORY:  She is a retired Engineer, civil (consulting). She is divorced once, but has been  married to her current husband since 75.  He is currently on disability  with pure autonomic failure and they currently live at [place].  Formerly used to spend the winter in [place] and the summer in [place].  Currently, does try to make it home to [facility], but  now spends the  winter at [place].  She smoked and quit 17 years ago. She drinks two to  three alcoholic drinks per day when she does not drive. She has two step  children, no children by her current marriage.   REVIEW OF SYSTEMS:  She has arthritis that involves her knees and her back.  There are no genitourinary symptoms, and no edema. She gets little exercise.  There is no exertional angina.  There are no eyes, ears, nose, or throat  problems, no pulmonary problems, and no definite skin problems. She has been  obese for several years. She does have a prior history of gastric ulcers and  bleeding ulcers in 1963.  Other than as noted above, the review of systems  is unremarkable.   PHYSICAL EXAMINATION:  GENERAL:  She is an obese woman who is crying when I  walked into the room to meet her.  VITAL SIGNS:  Blood pressure 140/80, pulse currently 76 and regular.  SKIN:  Warm and dry.  HEENT:  No JVD, thyromegaly, mass, or bruits.  LUNGS:  Clear to A&P.  HEART:  Normal S1 and S2. There is a 1/6 systolic murmur at the aortic area  radiating to the carotids.  ABDOMEN:  Soft and nontender with no mass.  EXTREMITIES:  Femoral pulses  are 2+, peripheral pulses 2 to 3+, no edema is  noted.   12-lead EKG shows sinus rhythm with minor nonspecific ST and T wave  abnormality.   LABORATORY DATA:  Hemoglobin 6.7 on admission, hemoglobin is currently 9.9.   IMPRESSION:  1. Isolated episode of chest discomfort occurring at rest.  With the     instrumentation that she has had of her esophagus, it is possible that     this could have been an isolated episode of esophageal spasm or     conversely could have been ischemic cardiac pain.  2. Bleeding gastric ulcers thought due to nonsteroid anti-inflammatory     agents and aspirin.  3. Hypertension not currently treated.  4. Significant situational stress.   RECOMMENDATIONS:  Withhold aspirin and nonsteroid anti-inflammatory agents  at the present time. Obtain serial cardiac enzymes with CPK and troponin.  Since she has been off of Diltiazem, restart the Diltiazem and check another  EKG.  If she does not have significant cardiac enzymes elevation, I would  suggest that she have an Adenosine Cardiolite scan prior to discharge to  screen for ischemic heart disease.   Thank you for asking me to see this nice woman with you.                                               Darden Palmer., M.D.    WST/MEDQ  D:  07/26/2002  T:  07/26/2002  Job:  [id number]   cc:   Danise Edge, M.D.  [address]  [place]  [place] [address]  Fax: [phone]   Thora Lance, M.D.  [address] [place]  [place] [address]  Fax: [phone]

## 2011-05-31 DIAGNOSIS — M1710 Unilateral primary osteoarthritis, unspecified knee: Secondary | ICD-10-CM

## 2011-05-31 HISTORY — DX: Unilateral primary osteoarthritis, unspecified knee: M17.10

## 2011-10-29 ENCOUNTER — Other Ambulatory Visit: Payer: Self-pay | Admitting: Family Medicine

## 2011-10-29 DIAGNOSIS — Z803 Family history of malignant neoplasm of breast: Secondary | ICD-10-CM

## 2011-10-29 DIAGNOSIS — Z1231 Encounter for screening mammogram for malignant neoplasm of breast: Secondary | ICD-10-CM

## 2011-11-25 DIAGNOSIS — G43809 Other migraine, not intractable, without status migrainosus: Secondary | ICD-10-CM | POA: Diagnosis not present

## 2011-12-02 ENCOUNTER — Telehealth: Payer: Self-pay | Admitting: Internal Medicine

## 2011-12-02 ENCOUNTER — Ambulatory Visit
Admission: RE | Admit: 2011-12-02 | Discharge: 2011-12-02 | Disposition: A | Discharge status: Home / Self Care | Payer: Medicare Other | Source: Ambulatory Visit | Attending: Family Medicine | Admitting: Family Medicine

## 2011-12-02 DIAGNOSIS — Z803 Family history of malignant neoplasm of breast: Secondary | ICD-10-CM

## 2011-12-02 DIAGNOSIS — Z1231 Encounter for screening mammogram for malignant neoplasm of breast: Secondary | ICD-10-CM | POA: Diagnosis not present

## 2011-12-02 NOTE — Telephone Encounter (Signed)
Thank you for considering me as a physician. Unfortunately, [facility] has closed my practice &  I have no option to change this. Other physicians in  at various sites are accepting new patients. Until their practices are full; I am not allowed to add new patients. 

## 2011-12-02 NOTE — Telephone Encounter (Signed)
Dr.Hopper please advise 

## 2011-12-02 NOTE — Telephone Encounter (Signed)
Patient called & stated her doctor Digby to hopper. She states that Dr. Hazle Quant told her he would take her on even though he is not accepting new patients. I advised her he was not take ANY new patients but she wanted him to review her name & be sure.  I can call her back ph# [phone], if you could please check with Dr. Alwyn Ren Thanks

## 2012-01-23 DIAGNOSIS — M1710 Unilateral primary osteoarthritis, unspecified knee: Secondary | ICD-10-CM | POA: Diagnosis not present

## 2012-02-23 DIAGNOSIS — I4891 Unspecified atrial fibrillation: Secondary | ICD-10-CM | POA: Diagnosis not present

## 2012-02-23 DIAGNOSIS — R0602 Shortness of breath: Secondary | ICD-10-CM | POA: Diagnosis not present

## 2012-02-26 DIAGNOSIS — L820 Inflamed seborrheic keratosis: Secondary | ICD-10-CM | POA: Diagnosis not present

## 2012-02-26 DIAGNOSIS — D485 Neoplasm of uncertain behavior of skin: Secondary | ICD-10-CM | POA: Diagnosis not present

## 2012-02-26 DIAGNOSIS — Z85828 Personal history of other malignant neoplasm of skin: Secondary | ICD-10-CM | POA: Diagnosis not present

## 2012-03-09 DIAGNOSIS — I714 Abdominal aortic aneurysm, without rupture: Secondary | ICD-10-CM | POA: Diagnosis not present

## 2012-03-09 DIAGNOSIS — I359 Nonrheumatic aortic valve disorder, unspecified: Secondary | ICD-10-CM | POA: Diagnosis not present

## 2012-03-09 DIAGNOSIS — I4891 Unspecified atrial fibrillation: Secondary | ICD-10-CM | POA: Diagnosis not present

## 2012-03-09 DIAGNOSIS — E785 Hyperlipidemia, unspecified: Secondary | ICD-10-CM | POA: Diagnosis not present

## 2012-03-12 DIAGNOSIS — D649 Anemia, unspecified: Secondary | ICD-10-CM | POA: Diagnosis not present

## 2012-03-12 DIAGNOSIS — L820 Inflamed seborrheic keratosis: Secondary | ICD-10-CM | POA: Diagnosis not present

## 2012-03-12 DIAGNOSIS — D485 Neoplasm of uncertain behavior of skin: Secondary | ICD-10-CM | POA: Diagnosis not present

## 2012-03-12 DIAGNOSIS — Z872 Personal history of diseases of the skin and subcutaneous tissue: Secondary | ICD-10-CM | POA: Diagnosis not present

## 2012-03-12 DIAGNOSIS — Z85828 Personal history of other malignant neoplasm of skin: Secondary | ICD-10-CM | POA: Diagnosis not present

## 2012-03-19 DIAGNOSIS — I10 Essential (primary) hypertension: Secondary | ICD-10-CM | POA: Diagnosis not present

## 2012-03-19 DIAGNOSIS — G4700 Insomnia, unspecified: Secondary | ICD-10-CM | POA: Diagnosis not present

## 2012-03-19 DIAGNOSIS — I4891 Unspecified atrial fibrillation: Secondary | ICD-10-CM | POA: Diagnosis not present

## 2012-03-30 DIAGNOSIS — R5381 Other malaise: Secondary | ICD-10-CM | POA: Diagnosis not present

## 2012-03-30 DIAGNOSIS — R5383 Other fatigue: Secondary | ICD-10-CM | POA: Diagnosis not present

## 2012-03-30 DIAGNOSIS — R110 Nausea: Secondary | ICD-10-CM | POA: Diagnosis not present

## 2012-03-30 DIAGNOSIS — I10 Essential (primary) hypertension: Secondary | ICD-10-CM | POA: Diagnosis not present

## 2012-03-30 DIAGNOSIS — I4891 Unspecified atrial fibrillation: Secondary | ICD-10-CM | POA: Diagnosis not present

## 2012-05-18 DIAGNOSIS — R609 Edema, unspecified: Secondary | ICD-10-CM | POA: Diagnosis not present

## 2012-05-18 DIAGNOSIS — I10 Essential (primary) hypertension: Secondary | ICD-10-CM | POA: Diagnosis not present

## 2012-05-18 DIAGNOSIS — I4891 Unspecified atrial fibrillation: Secondary | ICD-10-CM | POA: Diagnosis not present

## 2012-05-27 DIAGNOSIS — M25569 Pain in unspecified knee: Secondary | ICD-10-CM | POA: Diagnosis not present

## 2012-05-27 DIAGNOSIS — M1710 Unilateral primary osteoarthritis, unspecified knee: Secondary | ICD-10-CM | POA: Diagnosis not present

## 2012-06-03 DIAGNOSIS — I4891 Unspecified atrial fibrillation: Secondary | ICD-10-CM | POA: Diagnosis not present

## 2012-06-03 DIAGNOSIS — I2510 Atherosclerotic heart disease of native coronary artery without angina pectoris: Secondary | ICD-10-CM | POA: Diagnosis not present

## 2012-06-03 DIAGNOSIS — I739 Peripheral vascular disease, unspecified: Secondary | ICD-10-CM | POA: Diagnosis not present

## 2012-06-03 DIAGNOSIS — I10 Essential (primary) hypertension: Secondary | ICD-10-CM | POA: Diagnosis not present

## 2012-06-03 DIAGNOSIS — I7090 Unspecified atherosclerosis: Secondary | ICD-10-CM | POA: Diagnosis not present

## 2012-06-03 DIAGNOSIS — R609 Edema, unspecified: Secondary | ICD-10-CM | POA: Diagnosis not present

## 2012-06-08 DIAGNOSIS — E785 Hyperlipidemia, unspecified: Secondary | ICD-10-CM | POA: Diagnosis not present

## 2012-06-08 DIAGNOSIS — I4891 Unspecified atrial fibrillation: Secondary | ICD-10-CM | POA: Diagnosis not present

## 2012-06-08 DIAGNOSIS — I10 Essential (primary) hypertension: Secondary | ICD-10-CM | POA: Diagnosis not present

## 2012-06-08 DIAGNOSIS — Z79899 Other long term (current) drug therapy: Secondary | ICD-10-CM | POA: Diagnosis not present

## 2012-06-22 DIAGNOSIS — I10 Essential (primary) hypertension: Secondary | ICD-10-CM | POA: Diagnosis not present

## 2012-06-22 DIAGNOSIS — I359 Nonrheumatic aortic valve disorder, unspecified: Secondary | ICD-10-CM | POA: Diagnosis not present

## 2012-06-22 DIAGNOSIS — I4891 Unspecified atrial fibrillation: Secondary | ICD-10-CM | POA: Diagnosis not present

## 2012-06-24 DIAGNOSIS — I4891 Unspecified atrial fibrillation: Secondary | ICD-10-CM | POA: Diagnosis not present

## 2012-06-24 DIAGNOSIS — G4700 Insomnia, unspecified: Secondary | ICD-10-CM | POA: Diagnosis not present

## 2012-06-24 DIAGNOSIS — Z23 Encounter for immunization: Secondary | ICD-10-CM | POA: Diagnosis not present

## 2012-06-24 DIAGNOSIS — I10 Essential (primary) hypertension: Secondary | ICD-10-CM | POA: Diagnosis not present

## 2012-06-24 DIAGNOSIS — Z1211 Encounter for screening for malignant neoplasm of colon: Secondary | ICD-10-CM | POA: Diagnosis not present

## 2012-08-17 ENCOUNTER — Other Ambulatory Visit: Payer: Self-pay | Admitting: Internal Medicine

## 2012-08-17 ENCOUNTER — Ambulatory Visit
Admission: RE | Admit: 2012-08-17 | Discharge: 2012-08-17 | Disposition: A | Discharge status: Home / Self Care | Payer: Medicare Other | Source: Ambulatory Visit | Attending: Internal Medicine | Admitting: Internal Medicine

## 2012-08-17 DIAGNOSIS — R109 Unspecified abdominal pain: Secondary | ICD-10-CM

## 2012-09-09 DIAGNOSIS — M76899 Other specified enthesopathies of unspecified lower limb, excluding foot: Secondary | ICD-10-CM | POA: Diagnosis not present

## 2012-09-09 DIAGNOSIS — M1710 Unilateral primary osteoarthritis, unspecified knee: Secondary | ICD-10-CM | POA: Diagnosis not present

## 2012-10-01 DIAGNOSIS — H04129 Dry eye syndrome of unspecified lacrimal gland: Secondary | ICD-10-CM | POA: Diagnosis not present

## 2012-10-08 ENCOUNTER — Encounter (HOSPITAL_COMMUNITY): Payer: Self-pay | Admitting: Emergency Medicine

## 2012-10-08 ENCOUNTER — Emergency Department (HOSPITAL_COMMUNITY): Payer: Medicare Other

## 2012-10-08 ENCOUNTER — Inpatient Hospital Stay (HOSPITAL_COMMUNITY)
Admission: EM | Admit: 2012-10-08 | Discharge: 2012-10-10 | DRG: 641 | Disposition: A | Discharge status: Home / Self Care | Payer: Medicare Other | Attending: Internal Medicine | Admitting: Internal Medicine

## 2012-10-08 DIAGNOSIS — R0989 Other specified symptoms and signs involving the circulatory and respiratory systems: Secondary | ICD-10-CM | POA: Diagnosis not present

## 2012-10-08 DIAGNOSIS — I4949 Other premature depolarization: Secondary | ICD-10-CM | POA: Diagnosis present

## 2012-10-08 DIAGNOSIS — I499 Cardiac arrhythmia, unspecified: Secondary | ICD-10-CM | POA: Diagnosis not present

## 2012-10-08 DIAGNOSIS — M129 Arthropathy, unspecified: Secondary | ICD-10-CM | POA: Diagnosis present

## 2012-10-08 DIAGNOSIS — Z87891 Personal history of nicotine dependence: Secondary | ICD-10-CM

## 2012-10-08 DIAGNOSIS — E860 Dehydration: Principal | ICD-10-CM | POA: Diagnosis present

## 2012-10-08 DIAGNOSIS — G609 Hereditary and idiopathic neuropathy, unspecified: Secondary | ICD-10-CM | POA: Diagnosis present

## 2012-10-08 DIAGNOSIS — I4891 Unspecified atrial fibrillation: Secondary | ICD-10-CM | POA: Diagnosis not present

## 2012-10-08 DIAGNOSIS — R0602 Shortness of breath: Secondary | ICD-10-CM | POA: Diagnosis not present

## 2012-10-08 DIAGNOSIS — Z7901 Long term (current) use of anticoagulants: Secondary | ICD-10-CM

## 2012-10-08 DIAGNOSIS — H269 Unspecified cataract: Secondary | ICD-10-CM | POA: Diagnosis present

## 2012-10-08 DIAGNOSIS — Z888 Allergy status to other drugs, medicaments and biological substances status: Secondary | ICD-10-CM

## 2012-10-08 DIAGNOSIS — Z79899 Other long term (current) drug therapy: Secondary | ICD-10-CM

## 2012-10-08 DIAGNOSIS — F3289 Other specified depressive episodes: Secondary | ICD-10-CM | POA: Diagnosis present

## 2012-10-08 DIAGNOSIS — Z8711 Personal history of peptic ulcer disease: Secondary | ICD-10-CM

## 2012-10-08 DIAGNOSIS — I10 Essential (primary) hypertension: Secondary | ICD-10-CM | POA: Diagnosis present

## 2012-10-08 DIAGNOSIS — F411 Generalized anxiety disorder: Secondary | ICD-10-CM | POA: Diagnosis present

## 2012-10-08 DIAGNOSIS — F1010 Alcohol abuse, uncomplicated: Secondary | ICD-10-CM

## 2012-10-08 DIAGNOSIS — R0609 Other forms of dyspnea: Secondary | ICD-10-CM | POA: Diagnosis not present

## 2012-10-08 DIAGNOSIS — Z9119 Patient's noncompliance with other medical treatment and regimen: Secondary | ICD-10-CM

## 2012-10-08 DIAGNOSIS — Z91199 Patient's noncompliance with other medical treatment and regimen due to unspecified reason: Secondary | ICD-10-CM

## 2012-10-08 DIAGNOSIS — F329 Major depressive disorder, single episode, unspecified: Secondary | ICD-10-CM | POA: Diagnosis present

## 2012-10-08 HISTORY — DX: Personal history of other diseases of the digestive system: Z87.19

## 2012-10-08 HISTORY — DX: Essential (primary) hypertension: I10

## 2012-10-08 HISTORY — DX: Unspecified osteoarthritis, unspecified site: M19.90

## 2012-10-08 HISTORY — DX: Other acariasis: B88.0

## 2012-10-08 HISTORY — DX: Ventricular premature depolarization: I49.3

## 2012-10-08 HISTORY — DX: Gastrointestinal hemorrhage, unspecified: K92.2

## 2012-10-08 HISTORY — DX: Personal history of peptic ulcer disease: Z87.11

## 2012-10-08 HISTORY — DX: Migraine, unspecified, not intractable, without status migrainosus: G43.909

## 2012-10-08 HISTORY — DX: Unspecified cataract: H26.9

## 2012-10-08 HISTORY — DX: Urinary tract infection, site not specified: N39.0

## 2012-10-08 HISTORY — DX: Unspecified atrial fibrillation: I48.91

## 2012-10-08 LAB — POCT I-STAT TROPONIN I: Troponin i, poc: 0.02 ng/mL (ref 0.00–0.08)

## 2012-10-08 LAB — POCT I-STAT, CHEM 8
BUN: 19 mg/dL (ref 6–23)
Calcium, Ion: 1.07 mmol/L — ABNORMAL LOW (ref 1.13–1.30)
Chloride: 102 mEq/L (ref 96–112)
HCT: 47 % — ABNORMAL HIGH (ref 36.0–46.0)
Potassium: 3.6 mEq/L (ref 3.5–5.1)

## 2012-10-08 LAB — CBC
MCV: 93.4 fL (ref 78.0–100.0)
Platelets: 194 10*3/uL (ref 150–400)
RBC: 4.67 MIL/uL (ref 3.87–5.11)
WBC: 6.8 10*3/uL (ref 4.0–10.5)

## 2012-10-08 MED ORDER — METOPROLOL TARTRATE 25 MG PO TABS
25.0000 mg | ORAL_TABLET | Freq: Two times a day (BID) | ORAL | Status: DC
  Administered 2012-10-08 – 2012-10-10 (×4): 25 mg via ORAL
  Filled 2012-10-08 (×9): qty 1

## 2012-10-08 MED ORDER — POLYETHYLENE GLYCOL 3350 17 G PO PACK
17.0000 g | PACK | Freq: Every day | ORAL | Status: DC | PRN
  Filled 2012-10-08: qty 1

## 2012-10-08 MED ORDER — DILTIAZEM HCL 25 MG/5ML IV SOLN
10.0000 mg | Freq: Once | INTRAVENOUS | Status: AC
  Administered 2012-10-08: 10 mg via INTRAVENOUS
  Filled 2012-10-08: qty 5

## 2012-10-08 MED ORDER — ACETAMINOPHEN 650 MG RE SUPP: 650.0000 mg | Freq: Four times a day (QID) | RECTAL | Status: DC | PRN

## 2012-10-08 MED ORDER — ASPIRIN EC 81 MG PO TBEC
81.0000 mg | DELAYED_RELEASE_TABLET | Freq: Every day | ORAL | Status: DC
  Administered 2012-10-09 – 2012-10-10 (×2): 81 mg via ORAL
  Filled 2012-10-08 (×4): qty 1

## 2012-10-08 MED ORDER — LIP MEDEX EX OINT
TOPICAL_OINTMENT | CUTANEOUS | Status: DC | PRN
  Filled 2012-10-08 (×2): qty 7

## 2012-10-08 MED ORDER — LORAZEPAM 2 MG/ML IJ SOLN: 1.0000 mg | Freq: Four times a day (QID) | INTRAMUSCULAR | Status: DC | PRN

## 2012-10-08 MED ORDER — SODIUM CHLORIDE 0.9 % IV BOLUS (SEPSIS)
500.0000 mL | Freq: Once | INTRAVENOUS | Status: AC
  Administered 2012-10-08: 500 mL via INTRAVENOUS

## 2012-10-08 MED ORDER — FOLIC ACID 1 MG PO TABS
1.0000 mg | ORAL_TABLET | Freq: Every day | ORAL | Status: DC
  Administered 2012-10-08 – 2012-10-10 (×3): 1 mg via ORAL
  Filled 2012-10-08 (×3): qty 1

## 2012-10-08 MED ORDER — DILTIAZEM HCL 25 MG/5ML IV SOLN
10.0000 mg | Freq: Once | INTRAVENOUS | Status: AC
  Administered 2012-10-08: 10 mg via INTRAVENOUS

## 2012-10-08 MED ORDER — DILTIAZEM HCL 100 MG IV SOLR
20.0000 mg/h | Freq: Once | INTRAVENOUS | Status: AC
  Administered 2012-10-08: 10 mg/h via INTRAVENOUS
  Filled 2012-10-08: qty 100

## 2012-10-08 MED ORDER — VITAMIN B-1 100 MG PO TABS
100.0000 mg | ORAL_TABLET | Freq: Every day | ORAL | Status: DC
  Administered 2012-10-08 – 2012-10-10 (×3): 100 mg via ORAL
  Filled 2012-10-08 (×3): qty 1

## 2012-10-08 MED ORDER — PROMETHAZINE HCL 12.5 MG PO TABS: 12.5000 mg | ORAL_TABLET | Freq: Three times a day (TID) | ORAL | Status: DC | PRN

## 2012-10-08 MED ORDER — THIAMINE HCL 100 MG/ML IJ SOLN
100.0000 mg | Freq: Every day | INTRAMUSCULAR | Status: DC
  Filled 2012-10-08 (×3): qty 1

## 2012-10-08 MED ORDER — APIXABAN 5 MG PO TABS
5.0000 mg | ORAL_TABLET | Freq: Two times a day (BID) | ORAL | Status: DC
  Administered 2012-10-08 – 2012-10-10 (×4): 5 mg via ORAL
  Filled 2012-10-08 (×7): qty 1

## 2012-10-08 MED ORDER — SODIUM CHLORIDE 0.9 % IJ SOLN
3.0000 mL | Freq: Two times a day (BID) | INTRAMUSCULAR | Status: DC
  Administered 2012-10-09 – 2012-10-10 (×2): 3 mL via INTRAVENOUS

## 2012-10-08 MED ORDER — LORAZEPAM 1 MG PO TABS
1.0000 mg | ORAL_TABLET | Freq: Four times a day (QID) | ORAL | Status: DC | PRN
  Administered 2012-10-08 – 2012-10-09 (×3): 1 mg via ORAL
  Filled 2012-10-08 (×3): qty 1

## 2012-10-08 MED ORDER — DILTIAZEM HCL ER 90 MG PO CP12
90.0000 mg | ORAL_CAPSULE | Freq: Two times a day (BID) | ORAL | Status: DC
  Administered 2012-10-08 (×2): 90 mg via ORAL
  Filled 2012-10-08 (×5): qty 1

## 2012-10-08 MED ORDER — ACETAMINOPHEN 325 MG PO TABS: 650.0000 mg | ORAL_TABLET | Freq: Four times a day (QID) | ORAL | Status: DC | PRN

## 2012-10-08 MED ORDER — DEXTROSE 5 % IV SOLN
10.0000 mg/h | INTRAVENOUS | Status: DC
  Administered 2012-10-08: 10 mg/h via INTRAVENOUS
  Filled 2012-10-08: qty 100

## 2012-10-08 MED ORDER — ESCITALOPRAM OXALATE 10 MG PO TABS
10.0000 mg | ORAL_TABLET | Freq: Every day | ORAL | Status: DC
  Administered 2012-10-09 – 2012-10-10 (×2): 10 mg via ORAL
  Filled 2012-10-08 (×4): qty 1

## 2012-10-08 MED ORDER — SODIUM CHLORIDE 0.9 % IV SOLN
INTRAVENOUS | Status: DC
  Administered 2012-10-08: 11:00:00 via INTRAVENOUS

## 2012-10-08 MED ORDER — ZOLPIDEM TARTRATE 5 MG PO TABS
5.0000 mg | ORAL_TABLET | Freq: Every evening | ORAL | Status: DC | PRN
  Administered 2012-10-08 – 2012-10-09 (×2): 5 mg via ORAL
  Filled 2012-10-08 (×2): qty 1

## 2012-10-08 NOTE — ED Notes (Signed)
Pt c/o heart racing. Woke up states she felt and a little SOB with weakness. EMS on seen, Afib 130-160

## 2012-10-08 NOTE — ED Notes (Signed)
Pt BP 97/68. Told to Dr. Maryann Conners. Will hold Cardizem.

## 2012-10-08 NOTE — ED Notes (Signed)
Cardiology Dr. w/pt.

## 2012-10-08 NOTE — H&P (Signed)
[facility] History and Physical  [name] QIO:[id number] DOB: January 21, 1933 DOA: 10/08/2012  Referring physician: Ethelda Chick PCP: No primary provider on file.  Specialists: none  Chief Complaint: Rapid heart rate  HPI: [name] is a 79 y.o. female with a past medical history of atrial fibrillation initially diagnosed in 2013 presents with a chief complaint of palpitations dizziness and lightheadedness with sudden onset when she woke up this morning. She lives by herself in [place]. She denies any symptoms prior to this presentation other than a mild runny nose for the past week. She denies any fevers chills, and she does have a chronic cough dry in nature and that hasn't changed. She endorses mild shortness of breath with her heart rate being up. She does not have a history of a heart she denies any history of a CVA in the past. She is currently fully anticoagulated with Eliquis. Few days ago she realized that she missed all her medications. She states that since her atrial fibrillation was diagnosed last year she was initially on diltiazem 360 mg daily which did not control her heart very well. Reports heart rates in the mid 90s on that medication. At that time her cardiologist decrease her diltiazem to 180 mg daily and added metoprolol XL 50 mg daily. Since then her heart rate has been in the mid 60s. She does not recall her cardiologist name she knows he is in [place], [place]. Her primary care doctor is Dr. Valentina Lucks. About 2 days ago for her runny nose she went to [facility] and used a generic medication for the congestion. She reports that on and off chronic diarrhea for years, however she feels like in the past couple of weeks it may be a little bit worse. She has no abdominal pain no nausea no vomiting. She feels like she's been running behind on her fluids and made to be dehydrated. Endorses chronic mild tremors of her hands. She denies any urinary symptoms currently however she  does endorse on and off urinary tract infections. Her most recent urinary tract infections was November of 2013. At that time she was treated with ciprofloxacin with resolution of her symptoms.  Of note she has a history of GI bleed in 1963 and 2003, secondary to peptic ulcer disease. This has been stable for the past 10 years.   Review of Systems: The patient denies anorexia, fever, weight loss, vision loss, decreased hearing, hoarseness, chest pain, syncope, dyspnea on exertion, peripheral edema, balance deficits, hemoptysis, abdominal pain, melena, hematochezia, severe indigestion/heartburn, hematuria, incontinence, genital sores, muscle weakness, suspicious skin lesions, transient blindness, difficulty walking, depression, unusual weight change, abnormal bleeding, enlarged lymph nodes, angioedema, and breast masses.   Past Medical History  Diagnosis Date  . A-fib   . Hypertension   . Arthritis   . Cataracts, bilateral   . Other acariasis     peripherial neuropathy  . History of stomach ulcers   . GI bleed 06/2002  . UTI (lower urinary tract infection)     pt state uti w/ no pain  . PVC (premature ventricular contraction)   . Migraines    Past Surgical History  Procedure Date  . Breast reduction surgery 12/23/2003   Social History:  reports that she has quit smoking. Her smoking use included Cigarettes. She does not have any smokeless tobacco history on file. She reports that she drinks 2-3 alcoholic servings per day. This consists of mostly Scotch.   Allergies  Allergen Reactions  . Nickel  Other (See Comments)    inflammation  . Other     Prednisone causes sleep disturbances   Family history   her father had a heart arrhythmia and a CVA  Prior to Admission medications   Medication Sig Start Date End Date Taking? Authorizing Provider  acetaminophen (TYLENOL) 325 MG tablet Take 650 mg by mouth 2 (two) times daily as needed. For pain   Yes Historical Provider, MD  apixaban  (ELIQUIS) 5 MG TABS tablet Take 5 mg by mouth 2 (two) times daily.   Yes Historical Provider, MD  Cholecalciferol (VITAMIN D) 2000 UNITS CAPS Take 1 capsule by mouth daily.   Yes Historical Provider, MD  diltiazem (CARDIZEM CD) 180 MG 24 hr capsule Take 180 mg by mouth daily.   Yes Historical Provider, MD  escitalopram (LEXAPRO) 10 MG tablet Take 10 mg by mouth daily.   Yes Historical Provider, MD  losartan (COZAAR) 100 MG tablet Take 100 mg by mouth daily.   Yes Historical Provider, MD  metoprolol succinate (TOPROL-XL) 50 MG 24 hr tablet Take 50 mg by mouth daily. Take with or immediately following a meal.   Yes Historical Provider, MD  Omega-3 Fatty Acids (FISH OIL) 1200 MG CAPS Take 1 capsule by mouth daily.   Yes Historical Provider, MD  promethazine (PHENERGAN) 25 MG tablet Take 25 mg by mouth daily as needed. For nausea and vomiting   Yes Historical Provider, MD  zolpidem (AMBIEN) 10 MG tablet Take 10 mg by mouth at bedtime.   Yes Historical Provider, MD   Physical Exam: Filed Vitals:   10/08/12 1100 10/08/12 1130 10/08/12 1200 10/08/12 1207  BP: 113/60 128/91 102/67 94/76  Pulse: 51 81 74 88  Temp:      TempSrc:      Resp: 34 20 16 24   SpO2: 98% 97% 99% 97%     General:   in no acute distress sitting in bed eating lunch  Eyes:  to visit her and reactive to light no scleral icterus  Neck:  no JVD   Cardiovascular:  irregularly irregular, no murmurs rubs gallops  Respiratory: Good air movement lungs clear to auscultation bilaterally no crackles   Abdomen:  soft nondistended nontender to palpation  Skin:  no rashes   Musculoskeletal:  no peripheral edema   Psychiatric: normal mood and affect   Neurologic:  cranial nerves 2-12 grossly intact, no other focal findings   Labs on Admission:  Basic Metabolic Panel:  Lab 10/08/12 1610  NA 137  K 3.6  CL 102  CO2 --  GLUCOSE 102*  BUN 19  CREATININE 0.90  CALCIUM --  MG --  PHOS --   CBC:  Lab 10/08/12 1122  10/08/12 1047  WBC -- 6.8  NEUTROABS -- --  HGB 16.0* 15.7*  HCT 47.0* 43.6  MCV -- 93.4  PLT -- 194   Radiological Exams on Admission: Dg Chest Port 1 View  10/08/2012  *RADIOLOGY REPORT*  Clinical Data: Dyspnea.  Shortness of breath.  PORTABLE CHEST - 1 VIEW  Comparison: Two-view chest 02/04/2011.  Findings: The heart size is normal.  The lungs are clear.  The visualized soft tissues and bony thorax are unremarkable.  Minimal linear atelectasis or scarring is present at the lung bases.  IMPRESSION:  1.  Minimal linear atelectasis or scarring at the lung bases is similar to the prior exam. 2.  Scoliosis.   Original Report Authenticated By: Marin Roberts, M.D.     EKG: Independently reviewed. A  fib with RVR  Assessment/Plan Active Problems:  Atrial fibrillation with rapid ventricular response  Hypertension  Dehydration   1. Atrial fibrillation with RVR  - this is likely caused by a combination of dehydration, medication noncompliance, ongoing alcohol use as well as using over-the-counter medication for nasal congestion. Will admit to telemetry bed. Her blood pressure in the emergency room has been normal in the 120 systolic and we will start a diltiazem drip and restart her oral medications (diltiazem 90 twice a day and metoprolol 25 twice daily). Given her reported diarrhea and tremors we'll check a TSH level as she seems that she never had one. We will continue her anticoagulation while hospitalized.  2. Hypertension- we will hold her antihypertensives for now 3. Dehydration hemoglobin is elevated to 16 on presentation which support this.  4. Alcohol use - she endorses daily alcohol use. We'll monitor closely and place on CIWA if necessary    Code Status:  partial code. She does not want chest compressions or intubation mechanical ventilation however she agrees with acute shocks if needed to break the atrial fibrillation.  Family Communication: none (indicate person spoken with,  if applicable, with phone number if by telephone) Disposition Plan: 1-2 days (indicate anticipated LOS)  Time spent: 60  Pamella Pert [facility] Pager [phone]  If 7PM-7AM, please contact night-coverage [email or web address] Password [facility] 10/08/2012, 12:50 PM

## 2012-10-08 NOTE — ED Provider Notes (Signed)
History     CSN: [id number]  Arrival date & time 10/08/12  1009   First MD Initiated Contact with Patient 10/08/12 1030      Chief Complaint  Patient presents with  . Atrial Fibrillation    (Consider location/radiation/quality/duration/timing/severity/associated sxs/prior treatment) HPI Complains of rapid heartbeat and generalized weakness onset this morning. Symptoms accompanied by shortness of breath. Patient also reports yesterday she had neck pain and generalized weakness. No neck pain today she denies any chest pain. Brought by EMS noted to be in atrial fib with rapid ventricular rate in the field. No treatment prior to coming here. She admits to noncompliance with medications Past Medical History  Diagnosis Date  . A-fib   . Hypertension   . Arthritis    GI bleed  No past surgical history on file.  No family history on file.  History  Substance Use Topics  . Smoking status: Not on file  . Smokeless tobacco: Not on file  . Alcohol Use:    no tobacco use approximately 20 g per week no illicit drug use  OB History    Grav Para Term Preterm Abortions TAB SAB Ect Mult Living                  Review of Systems  HENT: Positive for neck pain.   Respiratory: Positive for shortness of breath.   Cardiovascular: Positive for palpitations.  Gastrointestinal: Negative.   Skin: Negative.   Neurological: Positive for weakness.  Hematological: Negative.   Psychiatric/Behavioral: Negative.   All other systems reviewed and are negative.    Allergies  Other  Home Medications  No current outpatient prescriptions on file.  BP 122/85  Pulse 167  Temp 98 F (36.7 C) (Oral)  Resp 10  SpO2 99%  Physical Exam  Nursing note and vitals reviewed. Constitutional: She appears well-developed and well-nourished.  HENT:  Head: Normocephalic and atraumatic.  Eyes: Conjunctivae normal are normal. Pupils are equal, round, and reactive to light.  Neck: Neck supple. No tracheal  deviation present. No thyromegaly present.  Cardiovascular:  No murmur heard.      Tachycardic irregularly irregular  Pulmonary/Chest: Effort normal and breath sounds normal.  Abdominal: Soft. Bowel sounds are normal. She exhibits no distension. There is no tenderness.  Musculoskeletal: Normal range of motion. She exhibits no edema and no tenderness.  Neurological: She is alert. Coordination normal.  Skin: Skin is warm and dry. No rash noted.  Psychiatric: She has a normal mood and affect.    ED Course  Procedures (including critical care time)  Date: 10/08/2012  Rate: 170  Rhythm: atrial fibrillation  QRS Axis: left  Intervals: normal  ST/T Wave abnormalities: nonspecific T wave changes  Conduction Disutrbances:none  Narrative Interpretation:   Old EKG Reviewed: Tracing from 10/05/2004 a normal sinus rhythm within normal limits interpreted by me   Labs Reviewed  CBC   No results found. Results for orders placed during the hospital encounter of 10/08/12  CBC      Component Value Range   WBC 6.8  4.0 - 10.5 K/uL   RBC 4.67  3.87 - 5.11 MIL/uL   Hemoglobin 15.7 (*) 12.0 - 15.0 g/dL   HCT 14.7  82.9 - 56.2 %   MCV 93.4  78.0 - 100.0 fL   MCH 33.6  26.0 - 34.0 pg   MCHC 36.0  30.0 - 36.0 g/dL   RDW 13.0  86.5 - 78.4 %   Platelets 194  150 -  400 K/uL  POCT I-STAT, CHEM 8      Component Value Range   Sodium 137  135 - 145 mEq/L   Potassium 3.6  3.5 - 5.1 mEq/L   Chloride 102  96 - 112 mEq/L   BUN 19  6 - 23 mg/dL   Creatinine, Ser 4.54  0.50 - 1.10 mg/dL   Glucose, Bld 098 (*) 70 - 99 mg/dL   Calcium, Ion 1.19 (*) 1.13 - 1.30 mmol/L   TCO2 23  0 - 100 mmol/L   Hemoglobin 16.0 (*) 12.0 - 15.0 g/dL   HCT 14.7 (*) 82.9 - 56.2 %  POCT I-STAT TROPONIN I      Component Value Range   Troponin i, poc 0.02  0.00 - 0.08 ng/mL   Comment 3            Dg Chest Port 1 View  10/08/2012  *RADIOLOGY REPORT*  Clinical Data: Dyspnea.  Shortness of breath.  PORTABLE CHEST - 1 VIEW   Comparison: Two-view chest 02/04/2011.  Findings: The heart size is normal.  The lungs are clear.  The visualized soft tissues and bony thorax are unremarkable.  Minimal linear atelectasis or scarring is present at the lung bases.  IMPRESSION:  1.  Minimal linear atelectasis or scarring at the lung bases is similar to the prior exam. 2.  Scoliosis.   Original Report Authenticated By: Marin Roberts, M.D.    Chest xray reviewed by me  No diagnosis found. Patient became transiently hypotensive after second intravenous bolus of Cardizem 10 mg. At 1207pm blood pressure 94/76 pulse 140. A saline 500 mL intravenous bolus was ordered by me., Which brought her blood pressure to 116/86 at 1330 p.m. Cardizem intravenous drip was then initiated.  At 1:55 PM she is resting comfortably, feels improved MDM  Spoke with hospitalist physician who will arrange for admission to telemtry Diagnosis #1 atrial fibrillation with rapid ventricular response Diagnosis #2 medication noncompliance   CRITICAL CARE Performed by: Doug Sou   Total critical care time: 30 minute  Critical care time was exclusive of separately billable procedures and treating other patients.  Critical care was necessary to treat or prevent imminent or life-threatening deterioration.  Critical care was time spent personally by me on the following activities: development of treatment plan with patient and/or surrogate as well as nursing, discussions with consultants, evaluation of patient's response to treatment, examination of patient, obtaining history from patient or surrogate, ordering and performing treatments and interventions, ordering and review of laboratory studies, ordering and review of radiographic studies, pulse oximetry and re-evaluation of patient's condition.     Doug Sou, MD 10/08/12 1359

## 2012-10-09 DIAGNOSIS — F1010 Alcohol abuse, uncomplicated: Secondary | ICD-10-CM

## 2012-10-09 DIAGNOSIS — I4891 Unspecified atrial fibrillation: Secondary | ICD-10-CM

## 2012-10-09 DIAGNOSIS — E860 Dehydration: Principal | ICD-10-CM

## 2012-10-09 DIAGNOSIS — I10 Essential (primary) hypertension: Secondary | ICD-10-CM

## 2012-10-09 LAB — CBC
MCH: 32.2 pg (ref 26.0–34.0)
Platelets: 178 10*3/uL (ref 150–400)
RBC: 4.26 MIL/uL (ref 3.87–5.11)
WBC: 5.3 10*3/uL (ref 4.0–10.5)

## 2012-10-09 LAB — BASIC METABOLIC PANEL
CO2: 24 mEq/L (ref 19–32)
Calcium: 9.2 mg/dL (ref 8.4–10.5)
Chloride: 99 mEq/L (ref 96–112)
GFR calc Af Amer: >90 mL/min (ref >90)
Sodium: 135 mEq/L (ref 135–145)

## 2012-10-09 LAB — GLUCOSE, CAPILLARY: Glucose-Capillary: 93 mg/dL (ref 70–99)

## 2012-10-09 LAB — TSH: TSH: 2.024 u[IU]/mL (ref 0.350–4.500)

## 2012-10-09 MED ORDER — DILTIAZEM HCL ER 90 MG PO CP12
90.0000 mg | ORAL_CAPSULE | Freq: Three times a day (TID) | ORAL | Status: DC
  Administered 2012-10-09 – 2012-10-10 (×3): 90 mg via ORAL
  Filled 2012-10-09 (×7): qty 1

## 2012-10-09 MED ORDER — POTASSIUM CHLORIDE CRYS ER 20 MEQ PO TBCR
40.0000 meq | EXTENDED_RELEASE_TABLET | Freq: Once | ORAL | Status: AC
  Administered 2012-10-09: 40 meq via ORAL
  Filled 2012-10-09: qty 2

## 2012-10-09 NOTE — Progress Notes (Signed)
Utilization review completed.  

## 2012-10-09 NOTE — Progress Notes (Signed)
[facility] PROGRESS NOTE  [name]:[id number] DOB: [birth year] DOA: 10/08/2012 PCP: No primary provider on file.  Assessment/Plan: 1-Atrial fibrillation with RVR: secondary to dehydration, ongoing alcohol use and OTC cold medications. -patient off cardizem drip -CE'z negative -cardizem adjusted for better control -if patient HR remains stable home tomorrow -continue apixaban -continue lopressor  2-Depression and anxiety: continue home regimen  3-HTN: stable and well controlled. Continue metoprolol and cardizem  4-Alcohol abuse: cessation counseling provided. Will continue CIWA   5-DVT: on apixaban  Code Status: PAR Family Communication: no family at bedside Disposition Plan: home when medically stable   Consultants:  none  Procedures:  See below for x-ray reports  Antibiotics:  none  HPI/Subjective: Afebrile, no CP, no SOB. Patient denies palpitaitons  Objective: Filed Vitals:   10/08/12 2100 10/09/12 0230 10/09/12 0500 10/09/12 1430  BP: 166/78 127/71 132/75 135/85  Pulse: 95 68 70 68  Temp: 98.3 F (36.8 C)  97.6 F (36.4 C) 97.3 F (36.3 C)  TempSrc: Oral  Oral   Resp: 20  18 17   Height:      Weight:      SpO2: 97%  96% 95%    Intake/Output Summary (Last 24 hours) at 10/09/12 1858 Last data filed at 10/09/12 1200  Gross per 24 hour  Intake    780 ml  Output      0 ml  Net    780 ml   Filed Weights   10/08/12 1447  Weight: 81.647 kg (180 lb)    Exam:   General:  NAD, feeling better and stronger  Cardiovascular: irregular, positive SEM, no rubs or gallops  Respiratory: CTA  Abdomen: soft, NT, ND, positive BS  Neuro: anxious, but essentially non focal, CN intact  Data Reviewed: Basic Metabolic Panel:  Lab 10/09/12 9562 10/08/12 1122  NA 135 137  K 3.5 3.6  CL 99 102  CO2 24 --  GLUCOSE 109* 102*  BUN 15 19  CREATININE 0.63 0.90  CALCIUM 9.2 --  MG -- --  PHOS -- --   CBC:  Lab 10/09/12 0620 10/08/12 1122  10/08/12 1047  WBC 5.3 -- 6.8  NEUTROABS -- -- --  HGB 13.7 16.0* 15.7*  HCT 39.8 47.0* 43.6  MCV 93.4 -- 93.4  PLT 178 -- 194   CBG:  Lab 10/09/12 0743  GLUCAP 93    Studies: Dg Chest Port 1 View  10/08/2012  *RADIOLOGY REPORT*  Clinical Data: Dyspnea.  Shortness of breath.  PORTABLE CHEST - 1 VIEW  Comparison: Two-view chest 02/04/2011.  Findings: The heart size is normal.  The lungs are clear.  The visualized soft tissues and bony thorax are unremarkable.  Minimal linear atelectasis or scarring is present at the lung bases.  IMPRESSION:  1.  Minimal linear atelectasis or scarring at the lung bases is similar to the prior exam. 2.  Scoliosis.   Original Report Authenticated By: Marin Roberts, M.D.     Scheduled Meds:   . apixaban  5 mg Oral BID  . aspirin EC  81 mg Oral Daily  . diltiazem  90 mg Oral Q8H  . escitalopram  10 mg Oral Daily  . folic acid  1 mg Oral Daily  . metoprolol tartrate  25 mg Oral BID  . sodium chloride  3 mL Intravenous Q12H  . thiamine  100 mg Oral Daily   Or  . thiamine  100 mg Intravenous Daily   Continuous Infusions:   . sodium chloride  10 mL/hr at 10/08/12 1116    Time spent: >30 minutes    Mohd. Derflinger  [facility] Pager [phone]. If 8PM-8AM, please contact night-coverage at [email or web address], password [facility] 10/09/2012, 6:58 PM  LOS: 1 day

## 2012-10-10 LAB — BASIC METABOLIC PANEL
Chloride: 97 mEq/L (ref 96–112)
Creatinine, Ser: 0.68 mg/dL (ref 0.50–1.10)
GFR calc Af Amer: >90 mL/min (ref >90)
GFR calc non Af Amer: 82 mL/min — ABNORMAL LOW (ref >90)

## 2012-10-10 LAB — MAGNESIUM: Magnesium: 1.7 mg/dL (ref 1.5–2.5)

## 2012-10-10 MED ORDER — THIAMINE HCL 100 MG PO TABS: 100.0000 mg | ORAL_TABLET | Freq: Every day | ORAL | Status: DC

## 2012-10-10 MED ORDER — FOLIC ACID 1 MG PO TABS: 1.0000 mg | ORAL_TABLET | Freq: Every day | ORAL | Status: DC

## 2012-10-10 MED ORDER — DILTIAZEM HCL ER COATED BEADS 180 MG PO CP24: 180.0000 mg | ORAL_CAPSULE | Freq: Two times a day (BID) | ORAL | Status: DC

## 2012-10-10 NOTE — Plan of Care (Signed)
Problem: Discharge Progression Outcomes Goal: INR monitor plan established Outcome: Adequate for Discharge Not needed on Eliquis

## 2012-10-10 NOTE — Discharge Summary (Signed)
Physician Discharge Summary  ATARA PATERSON ZOX:[id number] DOB: [birth year] DOA: 10/08/2012  PCP: No primary provider on file.  Admit date: 10/08/2012 Discharge date: 10/10/2012  Time spent: >30 minutes  Recommendations for Outpatient Follow-up:  -Take medications as prescribed -arrange follow up with PCP in 2 weeks -Stop drinking  Discharge Diagnoses:  Active Problems:  Atrial fibrillation with rapid ventricular response  Hypertension  Dehydration Depression  Discharge Condition: stable and improved. Will discharge   Diet recommendation: heart healthy medications  Filed Weights   10/08/12 1447 10/10/12 0615  Weight: 81.647 kg (180 lb) 81.194 kg (179 lb)    History of present illness:  79 y.o. female with a past medical history of atrial fibrillation initially diagnosed in 2013 presents with a chief complaint of palpitations dizziness and lightheadedness with sudden onset when she woke up this morning. She lives by herself in [place]. She denies any symptoms prior to this presentation other than a mild runny nose for the past week. She denies any fevers chills, and she does have a chronic cough dry in nature and that hasn't changed. She endorses mild shortness of breath with her heart rate being up. She does not have a history of a heart she denies any history of a CVA in the past. She is currently fully anticoagulated with Eliquis. Few days ago she realized that she missed all her medications. She states that since her atrial fibrillation was diagnosed last year she was initially on diltiazem 360 mg daily which did not control her heart very well. Reports heart rates in the mid 90s on that medication. At that time her cardiologist decrease her diltiazem to 180 mg daily and added metoprolol XL 50 mg daily. Since then her heart rate has been in the mid 60s. She does not recall her cardiologist name she knows he is in [place], [place]. Her primary care doctor is Dr. Valentina Lucks. About 2  days ago for her runny nose she went to [facility] and used a generic medication for the congestion. She reports that on and off chronic diarrhea for years, however she feels like in the past couple of weeks it may be a little bit worse. She has no abdominal pain no nausea no vomiting. She feels like she's been running behind on her fluids and made to be dehydrated. Endorses chronic mild tremors of her hands. She denies any urinary symptoms currently however she does endorse on and off urinary tract infections. Her most recent urinary tract infections was November of 2013. At that time she was treated with ciprofloxacin with resolution of her symptoms.    Hospital Course:  1-Atrial fibrillation with RVR: secondary to dehydration, ongoing alcohol use and OTC cold medications.  -patient initially required cardizem drip to control rate and rhythm; now after adjusting her cardizem rate is control and patient asymptomatic.  -CE'z negative  -continue apixaban  -continue lopressor at same dose. -patient advised to quit drinking, to take medications as prescribed and to avoid OTC medications w/o contacting PCP or pharmacist for interactions and side effects.  2-Depression and anxiety: continue home regimen   3-HTN: stable and well controlled. Continue metoprolol and cardizem   4-Alcohol abuse: cessation counseling provided. Will discharge on thiamine and folic acid  *Rest of medical problems remains stable; patient will continue current medication regimen and will follow with PCP as an outpatient.  Consultations:  none  Discharge Exam: Filed Vitals:   10/09/12 2052 10/09/12 2130 10/10/12 0615 10/10/12 0947  BP: 125/67 133/80 143/74  148/78  Pulse: 68 70 75 85  Temp: 98 F (36.7 C)  98.8 F (37.1 C)   TempSrc: Oral  Oral   Resp:      Height:      Weight:   81.194 kg (179 lb)   SpO2: 97%  93%     General: AAOX3, no CP, no SOB; feeling back to normal. No DT"s or withdrawal Cardiovascular:  irregular, no rubs or gallops. Positive SEM Respiratory: CTA bilaterally Neuron: non focal  Discharge Instructions  Discharge Orders    Future Orders Please Complete By Expires   Diet - low sodium heart healthy      Discharge instructions      Comments:   -Take medications as prescribed -arrange follow up with PCP in 2 weeks -Stop drinking       Medication List     As of 10/10/2012  1:02 PM    TAKE these medications         acetaminophen 325 MG tablet   Commonly known as: TYLENOL   Take 650 mg by mouth 2 (two) times daily as needed. For pain      diltiazem 180 MG 24 hr capsule   Commonly known as: CARDIZEM CD   Take 1 capsule (180 mg total) by mouth 2 (two) times daily.      ELIQUIS 5 MG Tabs tablet   Generic drug: apixaban   Take 5 mg by mouth 2 (two) times daily.      escitalopram 10 MG tablet   Commonly known as: LEXAPRO   Take 10 mg by mouth daily.      Fish Oil 1200 MG Caps   Take 1 capsule by mouth daily.      folic acid 1 MG tablet   Commonly known as: FOLVITE   Take 1 tablet (1 mg total) by mouth daily.      losartan 100 MG tablet   Commonly known as: COZAAR   Take 100 mg by mouth daily.      metoprolol succinate 50 MG 24 hr tablet   Commonly known as: TOPROL-XL   Take 50 mg by mouth daily. Take with or immediately following a meal.      promethazine 25 MG tablet   Commonly known as: PHENERGAN   Take 25 mg by mouth daily as needed. For nausea and vomiting      thiamine 100 MG tablet   Take 1 tablet (100 mg total) by mouth daily.      Vitamin D 2000 UNITS Caps   Take 1 capsule by mouth daily.      zolpidem 10 MG tablet   Commonly known as: AMBIEN   Take 10 mg by mouth at bedtime.          The results of significant diagnostics from this hospitalization (including imaging, microbiology, ancillary and laboratory) are listed below for reference.    Significant Diagnostic Studies: Dg Chest Port 1 View  10/08/2012  *RADIOLOGY REPORT*   Clinical Data: Dyspnea.  Shortness of breath.  PORTABLE CHEST - 1 VIEW  Comparison: Two-view chest 02/04/2011.  Findings: The heart size is normal.  The lungs are clear.  The visualized soft tissues and bony thorax are unremarkable.  Minimal linear atelectasis or scarring is present at the lung bases.  IMPRESSION:  1.  Minimal linear atelectasis or scarring at the lung bases is similar to the prior exam. 2.  Scoliosis.   Original Report Authenticated By: Marin Roberts, M.D.    Labs:  Basic Metabolic Panel:  Lab 10/10/12 1610 10/09/12 0620 10/08/12 1122  NA 134* 135 137  K 4.0 3.5 3.6  CL 97 99 102  CO2 23 24 --  GLUCOSE 109* 109* 102*  BUN 11 15 19   CREATININE 0.68 0.63 0.90  CALCIUM 9.2 9.2 --  MG 1.7 -- --  PHOS -- -- --   CBC:  Lab 10/09/12 0620 10/08/12 1122 10/08/12 1047  WBC 5.3 -- 6.8  NEUTROABS -- -- --  HGB 13.7 16.0* 15.7*  HCT 39.8 47.0* 43.6  MCV 93.4 -- 93.4  PLT 178 -- 194   CBG:  Lab 10/10/12 0727 10/09/12 0743  GLUCAP 106* 93     Signed:  Dominigue Gellner  [facility] 10/10/2012, 1:02 PM

## 2012-10-14 ENCOUNTER — Encounter: Payer: Self-pay | Admitting: Cardiology

## 2012-10-14 ENCOUNTER — Ambulatory Visit (INDEPENDENT_AMBULATORY_CARE_PROVIDER_SITE_OTHER): Payer: Medicare Other | Admitting: Cardiology

## 2012-10-14 VITALS — BP 142/80 | HR 104 | Ht 67.5 in | Wt 175.0 lb

## 2012-10-14 DIAGNOSIS — I4949 Other premature depolarization: Secondary | ICD-10-CM | POA: Diagnosis not present

## 2012-10-14 DIAGNOSIS — I493 Ventricular premature depolarization: Secondary | ICD-10-CM

## 2012-10-14 DIAGNOSIS — I4891 Unspecified atrial fibrillation: Secondary | ICD-10-CM | POA: Diagnosis not present

## 2012-10-14 DIAGNOSIS — I10 Essential (primary) hypertension: Secondary | ICD-10-CM | POA: Diagnosis not present

## 2012-10-14 DIAGNOSIS — I480 Paroxysmal atrial fibrillation: Secondary | ICD-10-CM | POA: Insufficient documentation

## 2012-10-14 MED ORDER — FLECAINIDE ACETATE 50 MG PO TABS: 50.0000 mg | ORAL_TABLET | Freq: Two times a day (BID) | ORAL | Status: DC

## 2012-10-14 NOTE — Progress Notes (Signed)
[name] Date of Birth: 1933/03/28 Medical Record #[id number]  History of Present Illness: [name] is seen Dr. Valentina Lucks for evaluation of atrial fibrillation. She is a pleasant 79 year old white female who has a history of atrial fibrillation. She thinks that she has had 4 or 5 episodes over the past 2 years. [place] of atrial fibrillation. She converted spontaneously to sinus rhythm. Echocardiogram at that time demonstrated mild aortic insufficiency and left atrial enlargement. LV function was normal. She was anticoagulated. More recently she was admitted to our hospital here on 10/08/2012 with recurrent atrial fibrillation. She was managed with rate control including metoprolol and diltiazem. She was continued on anticoagulation with Eliquis. No further cardiac evaluation was done. On followup today she states she is still in atrial fibrillation. She feels very weak and fatigued. She has increased cardiac awareness and a little bit of shortness of breath. Patient does have a history of hypertension. She has a history of moderate alcohol use. She reports that she is cut down to 2-1/2 ounces of scotch a day. She had remote evaluation with a nuclear stress test in 2003 which was normal. She has no history of heart attacks or ischemic heart disease.  Current Outpatient Prescriptions on File Prior to Visit  Medication Sig Dispense Refill  . acetaminophen (TYLENOL) 325 MG tablet Take 650 mg by mouth 2 (two) times daily as needed. For pain      . apixaban (ELIQUIS) 5 MG TABS tablet Take 5 mg by mouth 2 (two) times daily.      . Cholecalciferol (VITAMIN D) 2000 UNITS CAPS Take 1 capsule by mouth daily.      Marland Kitchen diltiazem (CARDIZEM CD) 180 MG 24 hr capsule Take 1 capsule (180 mg total) by mouth 2 (two) times daily.  60 capsule  1  . escitalopram (LEXAPRO) 10 MG tablet Take 10 mg by mouth daily.      . folic acid (FOLVITE) 1 MG tablet  Take 1 tablet (1 mg total) by mouth daily.  30 tablet  1  . losartan (COZAAR) 100 MG tablet Take 100 mg by mouth daily.      . metoprolol succinate (TOPROL-XL) 50 MG 24 hr tablet Take 50 mg by mouth daily. Take with or immediately following a meal.      . Omega-3 Fatty Acids (FISH OIL) 1200 MG CAPS Take 1 capsule by mouth daily.      . promethazine (PHENERGAN) 25 MG tablet Take 25 mg by mouth daily as needed. For nausea and vomiting      . thiamine 100 MG tablet Take 1 tablet (100 mg total) by mouth daily.  30 tablet  1  . zolpidem (AMBIEN) 10 MG tablet Take 10 mg by mouth at bedtime.      . flecainide (TAMBOCOR) 50 MG tablet Take 1 tablet (50 mg total) by mouth 2 (two) times daily.  180 tablet  3    Allergies  Allergen Reactions  . Nickel Other (See Comments)    inflammation  . Other     Prednisone causes sleep disturbances    Past Medical History  Diagnosis Date  . A-fib   . Hypertension   . Arthritis   . Cataracts, bilateral   . Other acariasis     peripherial neuropathy  . History of stomach ulcers   . GI bleed 06/2002  . UTI (lower urinary tract infection)  pt state uti w/ no pain  . PVC (premature ventricular contraction)   . Migraines     Past Surgical History  Procedure Date  . Breast reduction surgery 12/23/2003  . Cholecystectomy   . Cataracts   . Arthroscopic repair acl     History  Smoking status  . Former Smoker  . Types: Cigarettes  . Quit date: 10/09/1983  Smokeless tobacco  . Never Used    History  Alcohol Use  . Yes    Comment: daily  stoch    History reviewed. No pertinent family history.  Review of Systems: The review of systems is positive for marked fatigue.  All other systems were reviewed and are negative.  Physical Exam: BP 142/80  Pulse 104  Ht 5' 7.5" (1.715 m)  Wt 175 lb (79.379 kg)  BMI 27.00 kg/m2 She is a pleasant, elderly white female in no acute distress. The patient is alert and oriented x 3.  The mood and affect  are normal.  The skin is warm and dry.  Color is normal.  The HEENT exam reveals that the sclera are nonicteric.  The mucous membranes are moist.  The carotids are 2+ without bruits.  There is no thyromegaly.  There is no JVD.  The lungs are clear.    The heart exam reveals an irregular rate with a normal S1 and S2.  There is a grade 1-2/6 diastolic murmur the right upper sternal border.  The PMI is not displaced.   Abdominal exam reveals good bowel sounds.  There is no guarding or rebound.  There is no hepatosplenomegaly or tenderness.  There are no masses.  Exam of the legs reveal no clubbing, cyanosis, or edema.  The legs are without rashes.  The distal pulses are intact.  Cranial nerves II - XII are intact.  Motor and sensory functions are intact.  The gait is normal.  LABORATORY DATA: ECG demonstrates atrial fibrillation with a rapid ventricular response. This was during her hospitalization. Lab Results  Component Value Date   WBC 5.3 10/09/2012   HGB 13.7 10/09/2012   HCT 39.8 10/09/2012   PLT 178 10/09/2012   GLUCOSE 109* 10/10/2012   NA 134* 10/10/2012   K 4.0 10/10/2012   CL 97 10/10/2012   CREATININE 0.68 10/10/2012   BUN 11 10/10/2012   CO2 23 10/10/2012   TSH 2.024 10/09/2012     Assessment / Plan: 1. Atrial fibrillation, previously paroxysmal but now persistent. Patient is symptomatic despite rate control. She is on chronic anticoagulation. I recommended repeating an echocardiogram. I will start her on flecainide 50 mg twice daily. We will repeat an ECG in 48 hours. I'll followup again in 2 weeks. If she remains in atrial fibrillation at that time we will schedule her for an elective cardioversion.  2. Moderate alcohol use. Have counseled her on the need to reduce her alcohol intake.  3. Hypertension, controlled.

## 2012-10-14 NOTE — Patient Instructions (Signed)
Continue your current therapy  Start Flecainide 50 mg twice a day  We will schedule you for an echocardiogram  We will check an Ecg this Friday.  I will see you back in 2 weeks.

## 2012-10-15 ENCOUNTER — Telehealth: Payer: Self-pay | Admitting: Cardiology

## 2012-10-15 ENCOUNTER — Other Ambulatory Visit (HOSPITAL_COMMUNITY): Payer: Medicare Other

## 2012-10-15 NOTE — Telephone Encounter (Signed)
Spoke with patient she stated she feels like her heart is back in rhythm.States B/P this morning 93/51 Pulse 74.States she is concerned her B/P low.States she is feeling ok. Patient was told will let Dr.Jordan and call her back.

## 2012-10-15 NOTE — Telephone Encounter (Signed)
New problem:    Blood pressures is 93/51 back in believe she in  normal sinus rhythm.  Concern regarding over  medicate now.

## 2012-10-15 NOTE — Telephone Encounter (Signed)
Patient called was told spoke to Dr.Jordan. He advised to continue medication as prescribed,monitor pulse and B/P.Advised to keep appointment 10/16/12 for echo and EKG.Advised to call back if needed.

## 2012-10-16 ENCOUNTER — Ambulatory Visit (INDEPENDENT_AMBULATORY_CARE_PROVIDER_SITE_OTHER): Payer: Medicare Other

## 2012-10-16 ENCOUNTER — Ambulatory Visit (HOSPITAL_COMMUNITY): Payer: Medicare Other | Attending: Cardiology | Admitting: Radiology

## 2012-10-16 VITALS — BP 140/69 | HR 60 | Wt 175.0 lb

## 2012-10-16 DIAGNOSIS — I10 Essential (primary) hypertension: Secondary | ICD-10-CM | POA: Diagnosis not present

## 2012-10-16 DIAGNOSIS — I4891 Unspecified atrial fibrillation: Secondary | ICD-10-CM

## 2012-10-16 DIAGNOSIS — I493 Ventricular premature depolarization: Secondary | ICD-10-CM

## 2012-10-16 DIAGNOSIS — I359 Nonrheumatic aortic valve disorder, unspecified: Secondary | ICD-10-CM | POA: Insufficient documentation

## 2012-10-16 NOTE — Progress Notes (Signed)
Echocardiogram performed.  

## 2012-10-16 NOTE — Progress Notes (Signed)
Pt arrives in office for a EKG. EKG obtained and given to Dr. [place] for his review.  Per Dr [place], pt is advised that her EKG is ok. Pt. advised to call office if she has any questions or concerns, she verbalized understanding.

## 2012-10-26 ENCOUNTER — Ambulatory Visit (INDEPENDENT_AMBULATORY_CARE_PROVIDER_SITE_OTHER): Payer: Medicare Other | Admitting: Cardiology

## 2012-10-26 ENCOUNTER — Encounter: Payer: Self-pay | Admitting: Cardiology

## 2012-10-26 VITALS — BP 134/70 | HR 65 | Ht 67.5 in | Wt 176.8 lb

## 2012-10-26 DIAGNOSIS — I4891 Unspecified atrial fibrillation: Secondary | ICD-10-CM

## 2012-10-26 DIAGNOSIS — I493 Ventricular premature depolarization: Secondary | ICD-10-CM

## 2012-10-26 DIAGNOSIS — I10 Essential (primary) hypertension: Secondary | ICD-10-CM

## 2012-10-26 DIAGNOSIS — I4949 Other premature depolarization: Secondary | ICD-10-CM | POA: Diagnosis not present

## 2012-10-26 NOTE — Patient Instructions (Signed)
You may taper off metoprolol- decrease to 25 mg daily for 2 weeks then try and stop.  Monitor your blood pressure closely.  You need to reduce your alcohol intake.

## 2012-10-26 NOTE — Progress Notes (Signed)
[name] Date of Birth: Mar 04, 1933 Medical Record #[id number]  History of Present Illness: [name] is seen for follow up of atrial fibrillation. After her last visit she converted to NSR with one dose of flecanide. She doesn't think she's had any recurrence since then. She thinks she is taking too much medication. Feels very sluggish. When I ask if she has cut down to 2 oz of scotch/day she states "you must be joking".  Current Outpatient Prescriptions on File Prior to Visit  Medication Sig Dispense Refill  . acetaminophen (TYLENOL) 325 MG tablet Take 650 mg by mouth 2 (two) times daily as needed. For pain      . apixaban (ELIQUIS) 5 MG TABS tablet Take 5 mg by mouth 2 (two) times daily.      . Cholecalciferol (VITAMIN D) 2000 UNITS CAPS Take 1 capsule by mouth daily.      Marland Kitchen diltiazem (CARDIZEM CD) 180 MG 24 hr capsule Take 1 capsule (180 mg total) by mouth 2 (two) times daily.  60 capsule  1  . escitalopram (LEXAPRO) 10 MG tablet Take 10 mg by mouth daily.      . flecainide (TAMBOCOR) 50 MG tablet Take 1 tablet (50 mg total) by mouth 2 (two) times daily.  180 tablet  3  . folic acid (FOLVITE) 1 MG tablet Take 1 tablet (1 mg total) by mouth daily.  30 tablet  1  . losartan (COZAAR) 100 MG tablet Take 100 mg by mouth daily.      . metoprolol succinate (TOPROL-XL) 50 MG 24 hr tablet Take 50 mg by mouth daily. Take with or immediately following a meal.      . Omega-3 Fatty Acids (FISH OIL) 1200 MG CAPS Take 1 capsule by mouth daily.      . promethazine (PHENERGAN) 25 MG tablet Take 25 mg by mouth daily as needed. For nausea and vomiting      . thiamine 100 MG tablet Take 1 tablet (100 mg total) by mouth daily.  30 tablet  1  . zolpidem (AMBIEN) 10 MG tablet Take 10 mg by mouth at bedtime.        Allergies  Allergen Reactions  . Nickel Other (See Comments)    inflammation  . Other     Prednisone causes sleep disturbances    Past Medical History  Diagnosis Date  . A-fib   .  Hypertension   . Arthritis   . Cataracts, bilateral   . Other acariasis     peripherial neuropathy  . History of stomach ulcers   . GI bleed 06/2002  . UTI (lower urinary tract infection)     pt state uti w/ no pain  . PVC (premature ventricular contraction)   . Migraines     Past Surgical History  Procedure Date  . Breast reduction surgery 12/23/2003  . Cholecystectomy   . Cataracts   . Arthroscopic repair acl     History  Smoking status  . Former Smoker  . Types: Cigarettes  . Quit date: 10/09/1983  Smokeless tobacco  . Never Used    History  Alcohol Use  . Yes    Comment: daily  stoch    History reviewed. No pertinent family history.  Review of Systems: The review of systems is positive for marked fatigue.  All other systems were reviewed and are negative.  Physical Exam: BP 134/70  Pulse 65  Ht 5' 7.5" (1.715 m)  Wt 176 lb 12.8 oz (80.196  kg)  BMI 27.28 kg/m2  SpO2 96% She is a pleasant, elderly white female in no acute distress. The patient is alert and oriented x 3.    The skin is warm and dry.    The HEENT exam is normal. There is no JVD.  The lungs are clear.    The heart exam reveals an irregular rate with a normal S1 and S2.  There is a grade 1-2/6 diastolic murmur the right upper sternal border.  The PMI is not displaced.   Abdominal exam reveals good bowel sounds.  There is no guarding or rebound.  There is no hepatosplenomegaly or tenderness.  There are no masses.  Exam of the legs reveal no clubbing, cyanosis, or edema.  The legs are without rashes.  The distal pulses are intact.  Cranial nerves II - XII are intact.  Motor and sensory functions are intact.  The gait is normal.  LABORATORY DATA: ECG 10/16/12 shows NSR with first degree AV block. Nonspecific ST abnormality.  Lab Results  Component Value Date   WBC 5.3 10/09/2012   HGB 13.7 10/09/2012   HCT 39.8 10/09/2012   PLT 178 10/09/2012   GLUCOSE 109* 10/10/2012   NA 134* 10/10/2012   K 4.0  10/10/2012   CL 97 10/10/2012   CREATININE 0.68 10/10/2012   BUN 11 10/10/2012   CO2 23 10/10/2012   TSH 2.024 10/09/2012   Transthoracic Echocardiography  Patient: [name], [name] MR #: [id number] Study Date: 10/16/2012 Gender: F Age: 79 Height: 170.2cm Weight: 79.4kg BSA: 1.32m^2 Pt. Status: Room:  ORDERING [place], Lynx Goodrich REFERRING [place], Jalan Fariss ATTENDING Cassell Clement SONOGRAPHER Aida Raider, RDCS PERFORMING Red[facility] Gainer, Site 3 cc:  ------------------------------------------------------------ LV EF: 55% - 60%  ------------------------------------------------------------ Indications: Atrial fibrillation - 427.31.  ------------------------------------------------------------ History: PMH: Acquired from the patient and from the patient's chart. PMH: PVCs. Risk factors: Former tobacco use. Hypertension.  ------------------------------------------------------------ Study Conclusions  - Left ventricle: The cavity size was normal. Wall thickness was increased in a pattern of moderate LVH. Systolic function was normal. The estimated ejection fraction was in the range of 55% to 60%. Wall motion was normal; there were no regional wall motion abnormalities. Doppler parameters are consistent with abnormal left ventricular relaxation (grade 1 diastolic dysfunction). - Aortic valve: Mild regurgitation. - Left atrium: The atrium was mildly to moderately dilated. Transthoracic echocardiography. M-mode, complete 2D, spectral Doppler, and color Doppler. Height: Height: 170.2cm. Height: 67in. Weight: Weight: 79.4kg. Weight: 174.6lb. Body mass index: BMI: 27.4kg/m^2. Body surface area: BSA: 1.7m^2. Blood pressure: 142/80. Patient status: Outpatient. Location:  Site 3  ------------------------------------------------------------  ------------------------------------------------------------ Left ventricle: The cavity size was normal. Wall thickness was increased in  a pattern of moderate LVH. Systolic function was normal. The estimated ejection fraction was in the range of 55% to 60%. Wall motion was normal; there were no regional wall motion abnormalities. Doppler parameters are consistent with abnormal left ventricular relaxation (grade 1 diastolic dysfunction).  ------------------------------------------------------------ Aortic valve: Trileaflet; mildly thickened leaflets. Sclerosis without stenosis. Doppler: Mild regurgitation.  ------------------------------------------------------------ Aorta: Aortic root: The aortic root was normal in size. Ascending aorta: The ascending aorta was normal in size.  ------------------------------------------------------------ Mitral valve: Structurally normal valve. Leaflet separation was normal. Doppler: Transvalvular velocity was within the normal range. There was no evidence for stenosis. No regurgitation.  ------------------------------------------------------------ Left atrium: The atrium was mildly to moderately dilated.  ------------------------------------------------------------ Right ventricle: The cavity size was normal. Wall thickness was normal. Systolic function was normal.  ------------------------------------------------------------ Pulmonic valve: Structurally  normal valve. Cusp separation was normal. Doppler: Transvalvular velocity was within the normal range. No regurgitation.  ------------------------------------------------------------ Tricuspid valve: Mildly thickened leaflets. Leaflet separation was normal. Doppler: Transvalvular velocity was within the normal range. No significant regurgitation.  ------------------------------------------------------------ Right atrium: The atrium was normal in size.  ------------------------------------------------------------ Pericardium: There was no pericardial  effusion.  ------------------------------------------------------------ Systemic veins: Inferior vena cava: The vessel was normal in size; the respirophasic diameter changes were in the normal range (= 50%); findings are consistent with normal central venous pressure.  ------------------------------------------------------------  2D measurements Normal Doppler measurements Normal Left ventricle Left ventricle LVID ED, 29.4 mm 43-52 Ea, lat 9.2 cm/s ------ chord, ann, tiss 1 PLAX DP LVID ES, 21.4 mm 23-38 E/Ea, lat 7.3 ------ chord, ann, tiss 3 PLAX DP FS, chord, 27 % >29 Ea, med 8.6 cm/s ------ PLAX ann, tiss 6 LVPW, ED 13 mm ------ DP IVS/LVPW 1.36 <1.3 E/Ea, med 7.7 ------ ratio, ED ann, tiss 9 Ventricular septum DP IVS, ED 17.7 mm ------ LVOT LVOT Peak vel, S 163 cm/s ------ Diam, S 21 mm ------ VTI, S 30. cm ------ Area 3.46 cm^2 ------ 9 Diam 21 mm ------ Peak 11 mm Hg ------ Aorta gradient, S Root diam, 30 mm ------ Stroke vol 107 ml ------ ED Stroke 56 ml/m^2 ------ Left atrium index AP dim 48 mm ------ Aortic valve AP dim 2.51 cm/m^2 <2.2 Regurg PHT 931 ms ------ index Mitral valve Peak E vel 67. cm/s ------ 5 Peak A vel 82. cm/s ------ 3 Deceleratio 243 ms 150-23 n time 0 Peak E/A 0.8 ------ ratio Right ventricle Sa vel, lat 12. cm/s ------ ann, tiss 8 DP  ------------------------------------------------------------ Prepared and Electronically Authenticated by  Cassell Clement [id number]   Assessment / Plan: 1. Atrial fibrillation- converted to NSR on flecainide. Given multiple recurrences will continue 50 mg bid for maintenance. Will taper off metoprolol to see if this will help with her fatigue. Continue diltiazem.  2. Moderate alcohol use. Have counseled her on the need to reduce her alcohol intake. This may have a direct relationship to her atrial fibrillation and high BP  3. Hypertension, controlled. Will monitor as she comes  off metoprolol.

## 2012-10-27 DIAGNOSIS — K5730 Diverticulosis of large intestine without perforation or abscess without bleeding: Secondary | ICD-10-CM

## 2012-10-27 DIAGNOSIS — E669 Obesity, unspecified: Secondary | ICD-10-CM

## 2012-10-27 DIAGNOSIS — G4700 Insomnia, unspecified: Secondary | ICD-10-CM | POA: Insufficient documentation

## 2012-10-27 DIAGNOSIS — R609 Edema, unspecified: Secondary | ICD-10-CM

## 2012-10-27 HISTORY — DX: Diverticulosis of large intestine without perforation or abscess without bleeding: K57.30

## 2012-10-27 HISTORY — DX: Edema, unspecified: R60.9

## 2012-10-27 HISTORY — DX: Insomnia, unspecified: G47.00

## 2012-10-27 HISTORY — DX: Obesity, unspecified: E66.9

## 2012-10-28 ENCOUNTER — Encounter: Payer: Self-pay | Admitting: Cardiology

## 2012-10-28 DIAGNOSIS — F1020 Alcohol dependence, uncomplicated: Secondary | ICD-10-CM | POA: Insufficient documentation

## 2012-10-28 DIAGNOSIS — I10 Essential (primary) hypertension: Secondary | ICD-10-CM | POA: Diagnosis not present

## 2012-10-28 DIAGNOSIS — F329 Major depressive disorder, single episode, unspecified: Secondary | ICD-10-CM | POA: Diagnosis not present

## 2012-10-28 DIAGNOSIS — R110 Nausea: Secondary | ICD-10-CM

## 2012-10-28 DIAGNOSIS — I4891 Unspecified atrial fibrillation: Secondary | ICD-10-CM | POA: Diagnosis not present

## 2012-10-28 HISTORY — DX: Alcohol dependence, uncomplicated: F10.20

## 2012-10-28 HISTORY — DX: Nausea: R11.0

## 2012-11-09 DIAGNOSIS — R110 Nausea: Secondary | ICD-10-CM | POA: Diagnosis not present

## 2012-11-09 DIAGNOSIS — F329 Major depressive disorder, single episode, unspecified: Secondary | ICD-10-CM | POA: Diagnosis not present

## 2012-11-09 DIAGNOSIS — I4891 Unspecified atrial fibrillation: Secondary | ICD-10-CM | POA: Diagnosis not present

## 2012-11-25 DIAGNOSIS — I4891 Unspecified atrial fibrillation: Secondary | ICD-10-CM | POA: Diagnosis not present

## 2012-12-02 DIAGNOSIS — R110 Nausea: Secondary | ICD-10-CM | POA: Diagnosis not present

## 2012-12-02 DIAGNOSIS — I4891 Unspecified atrial fibrillation: Secondary | ICD-10-CM | POA: Diagnosis not present

## 2012-12-02 DIAGNOSIS — F329 Major depressive disorder, single episode, unspecified: Secondary | ICD-10-CM | POA: Diagnosis not present

## 2012-12-16 DIAGNOSIS — H04129 Dry eye syndrome of unspecified lacrimal gland: Secondary | ICD-10-CM | POA: Diagnosis not present

## 2012-12-18 ENCOUNTER — Telehealth: Payer: Self-pay | Admitting: Geriatric Medicine

## 2012-12-18 NOTE — Telephone Encounter (Signed)
Patient called and stated that she was having problems with the dosage of Mirtazapine. During her last office visit on 12/02/12 Claudette Krell increased the dosage from 15mg  daily to 30mg  daily. Patient says she has been trembling and loosing track of time since the dosage change. I spoke with Claudette and her suggestion was to reduce back down to 15mg  daily. I informed the patient of the change and she informed me that she already decided to do that on her own two nights ago and the symptoms still persist. I then spoke with Wyatt Portela and her suggestion was to reduce to 15mg  every other night and to schedule an appointment for patient to see Claudette again. I informed patient and scheduled an appointment for Wednesday, December 23, 2012 at [facility] with Claudette.

## 2012-12-23 ENCOUNTER — Encounter: Payer: Self-pay | Admitting: Geriatric Medicine

## 2012-12-23 ENCOUNTER — Non-Acute Institutional Stay (INDEPENDENT_AMBULATORY_CARE_PROVIDER_SITE_OTHER): Payer: Medicare Other | Admitting: Geriatric Medicine

## 2012-12-23 VITALS — BP 110/60 | HR 68 | Wt 174.0 lb

## 2012-12-23 DIAGNOSIS — F329 Major depressive disorder, single episode, unspecified: Secondary | ICD-10-CM

## 2012-12-23 DIAGNOSIS — F3289 Other specified depressive episodes: Secondary | ICD-10-CM

## 2012-12-23 MED ORDER — DULOXETINE HCL 30 MG PO CPEP: 30.0000 mg | ORAL_CAPSULE | Freq: Every day | ORAL | Status: DC

## 2012-12-23 NOTE — Progress Notes (Signed)
Patient ID: [name], female   DOB: 1933-07-27, 79 y.o.   MRN: [id number] Chief Complaint  Patient presents with  . Shaking    since starting Mirtazapine   REVIEW OF SYSTEMS  DATA OBTAINED: from patient,  GENERAL: Feels well. No fevers, fatigue, change in activity status. No change in appetite, weight or sleep. RESPIRATORY: No cough, wheezing, SOB CARDIAC: No chest pain, palpitations. No edema. GI: No abdominal pain. No N/V/D or constipation. No heartburn or reflux.  MUSCULOSKELETAL: No joint pain, swelling or stiffness. No back pain. No muscle ache, pain, weakness. Gait is steady. No recent falls.  NEUROLOGIC: TREMOR PRESENT , See HPI No dizziness, fainting, headache, imbalance, numbness. No change in mental status.  PSYCHIATRIC: depression symptoms persist  PHYSICAL EXAM Filed Vitals:   12/23/12 1325  BP: 110/60  Pulse: 68   Filed Weights   12/23/12 1325  Weight: 174 lb (78.926 kg)   GENERAL APPEARANCE: No acute distress, appropriately groomed, normal body habitus. Alert, pleasant, conversant. EYES: Conjunctiva/lids clear.  RESPIRATORY: Breathing is even, unlabored.  MUSCULOSKELETAL.  Gait is steady PSYCHIATRIC: Mood and affect appropriate to situation.   ASSESSMENT/PLAN   Depression The patient has significant hand tremors in  the last few weeks. Have not improved with decreased dose. Of  Mirtazapine.  Patient also reports there's been no change in her overall mood. Stop mirtazapine. SSRI was not effective (Lexapro) Recommend  SNRI next, Cymbalta. Will wait one week before starting to allow mirtazapine to wash out of her system   Follow up as scheduled 01/13/13  Vernica Wachtel T.Lavaris Sexson, NP-C 12/23/2012

## 2012-12-23 NOTE — Assessment & Plan Note (Signed)
The patient has significant hand tremors in  the last few weeks. Have not improved with decreased dose. Of  Mirtazapine.  Patient also reports there's been no change in her overall mood. Stop mirtazapine. SSRI was not effective (Lexapro) Recommend  SNRI next, Cymbalta. Will wait one week before starting to allow mirtazapine to wash out of her system

## 2012-12-28 DIAGNOSIS — M1710 Unilateral primary osteoarthritis, unspecified knee: Secondary | ICD-10-CM | POA: Diagnosis not present

## 2013-01-11 ENCOUNTER — Non-Acute Institutional Stay: Payer: Medicare Other | Admitting: Internal Medicine

## 2013-01-11 ENCOUNTER — Encounter: Payer: Self-pay | Admitting: Internal Medicine

## 2013-01-11 VITALS — BP 122/72 | HR 80 | Wt 168.0 lb

## 2013-01-11 DIAGNOSIS — E860 Dehydration: Secondary | ICD-10-CM | POA: Diagnosis not present

## 2013-01-11 DIAGNOSIS — F329 Major depressive disorder, single episode, unspecified: Secondary | ICD-10-CM | POA: Diagnosis not present

## 2013-01-11 DIAGNOSIS — R259 Unspecified abnormal involuntary movements: Secondary | ICD-10-CM

## 2013-01-11 DIAGNOSIS — R251 Tremor, unspecified: Secondary | ICD-10-CM

## 2013-01-11 DIAGNOSIS — I4891 Unspecified atrial fibrillation: Secondary | ICD-10-CM | POA: Diagnosis not present

## 2013-01-11 DIAGNOSIS — R110 Nausea: Secondary | ICD-10-CM

## 2013-01-11 DIAGNOSIS — I10 Essential (primary) hypertension: Secondary | ICD-10-CM

## 2013-01-11 MED ORDER — LOSARTAN POTASSIUM 100 MG PO TABS: ORAL_TABLET | ORAL | Status: DC

## 2013-01-11 MED ORDER — METOPROLOL SUCCINATE ER 50 MG PO TB24: ORAL_TABLET | ORAL | Status: DC

## 2013-01-11 NOTE — Progress Notes (Signed)
Subjective:    Patient ID: [name], female    DOB: February 23, 1933, 79 y.o.   MRN: [id number]  HPI Continues with nausea and loss of appetite. She thinks some of her medications are making this worse. No vomiting. Denies reflux. Lots of gas. Either has diarrhea or constipation, rarely a normal stool. Diarrhea is more prominent. Dr. Matthias Hughs gave her cholestyramine, but it made her more nauseous. No longer uses. Denies cramps. She thinks she has lost almost 20# in the last 6 mo. Used to weigh 224#. Normal colonoscopy about 2010. CT abd Nov 2013 was normal. EGD: last was by Dr. Mickeal Needy 01/08/13. Knees buckled. Large ecchymoses of the left upper arm. Small bruise of the left cheek. Full range of motion at the shoulder. No injury of the back.  Complains of excessive fatigue. Loss of stamina.  Patient Active Problem List  Diagnosis  . Hypertension      BP now on the low side  . A-fib      Controlled, but she feels worse on the medications used to control the rapid rhythm. Has already cut metoprolol in 1/2.  Marland Kitchen PVC (premature ventricular contraction)      asymptomatic  . Depression      She wants to stop antidepressants. She feels worse on them. She is not convinced she is depressed. "I just feel bad".  . Tremor     No better since stopping mirtazapine and starting Cymbalta.  . Nausea alone      Chronic condition with recent worsening.      Review of Systems  Constitutional: Negative for fever, chills, activity change, appetite change and unexpected weight change.  HENT: Positive for hearing loss, ear pain, congestion, neck pain and neck stiffness.   Respiratory: Positive for shortness of breath. Negative for cough, choking and chest tightness.   Cardiovascular: Negative for chest pain, palpitations and leg swelling.  Gastrointestinal: Positive for nausea. Negative for vomiting, abdominal pain, diarrhea, constipation and abdominal distention.  Endocrine: Negative for cold  intolerance, heat intolerance, polydipsia, polyphagia and polyuria.  Musculoskeletal: Positive for back pain, arthralgias and gait problem.  Skin: Negative.   Neurological: Positive for tremors and weakness. Negative for dizziness, seizures, light-headedness, numbness and headaches.  Hematological: Negative.   Psychiatric/Behavioral: Positive for sleep disturbance.       Objective:   Physical Exam  Constitutional: She is oriented to person, place, and time. She appears well-nourished. No distress.  HENT:  Head: Normocephalic and atraumatic.  Right Ear: External ear normal.  Left Ear: External ear normal.  Nose: Nose normal.  Eyes: Conjunctivae and EOM are normal. Pupils are equal, round, and reactive to light.  Prescription lenses.  Neck: Normal range of motion. Neck supple. No JVD present. No tracheal deviation present. No thyromegaly present.  Cardiovascular: Normal rate, regular rhythm, normal heart sounds and intact distal pulses.  Exam reveals no gallop and no friction rub.   No murmur heard. Pulmonary/Chest: Effort normal and breath sounds normal. No respiratory distress. She has no wheezes. She has no rales.  Abdominal: Soft. Bowel sounds are normal. She exhibits no distension and no mass. There is no tenderness.  Musculoskeletal: Normal range of motion. She exhibits no edema and no tenderness.  Lymphadenopathy:    She has no cervical adenopathy.  Neurological: She is alert and oriented to person, place, and time. A cranial nerve deficit is present.  Skin: Skin is warm and dry. No rash noted. No erythema. No pallor.  Psychiatric: She  has a normal mood and affect. Her behavior is normal. Thought content normal.          Assessment & Plan:   Problem List Items Addressed This Visit     ICD-9-CM     Unprioritized   Hypertension (Chronic)   Relevant Medications      metoprolol succinate (TOPROL-XL) 24 hr tablet      losartan (COZAAR) tablet reduced to 50 mg qd   A-fib  (Chronic)      Controlled   Relevant Medications      metoprolol succinate (TOPROL-XL) 24 hr tablet      losartan (COZAAR) tablet   Depression (Chronic)       She is not convinced she is depressed   Tremor - Primary (Chronic)      Stop mirtazapine and Cymbalta   Nausea alone (Chronic)      Hopefully stopping some of her meds will give her relief. May need GI help again.   RESOLVED: Dehydration     Return in 3 weeks.

## 2013-01-11 NOTE — Patient Instructions (Addendum)
Reduce losartan to 50 mg daily. Stop Cymbalta.

## 2013-01-13 ENCOUNTER — Encounter: Payer: Self-pay | Admitting: Geriatric Medicine

## 2013-02-01 ENCOUNTER — Non-Acute Institutional Stay: Payer: Medicare Other | Admitting: Internal Medicine

## 2013-02-01 VITALS — BP 108/64 | HR 72 | Ht 67.5 in | Wt 167.0 lb

## 2013-02-01 DIAGNOSIS — I4891 Unspecified atrial fibrillation: Secondary | ICD-10-CM

## 2013-02-01 DIAGNOSIS — R251 Tremor, unspecified: Secondary | ICD-10-CM

## 2013-02-01 DIAGNOSIS — I10 Essential (primary) hypertension: Secondary | ICD-10-CM

## 2013-02-01 DIAGNOSIS — F329 Major depressive disorder, single episode, unspecified: Secondary | ICD-10-CM

## 2013-02-01 DIAGNOSIS — R259 Unspecified abnormal involuntary movements: Secondary | ICD-10-CM | POA: Diagnosis not present

## 2013-02-01 DIAGNOSIS — R110 Nausea: Secondary | ICD-10-CM

## 2013-02-01 MED ORDER — CHOLESTYRAMINE 4 GM/DOSE PO POWD: ORAL | Status: DC

## 2013-02-01 MED ORDER — DILTIAZEM HCL ER COATED BEADS 180 MG PO CP24: 180.0000 mg | ORAL_CAPSULE | Freq: Two times a day (BID) | ORAL | Status: DC

## 2013-02-01 NOTE — Patient Instructions (Signed)
Resume as previously recommended by Dr. Matthias Hughs to help nausea.

## 2013-02-01 NOTE — Progress Notes (Signed)
Subjective:    Patient ID: [name], female    DOB: 07-06-1933, 79 y.o.   MRN: [id number]  HPI  Nausea is the same. Consistent throughout the day. Has been present for about 2 years. Does not vomit.  Unable to tolerate the mirtazipine. Says at caused as tremor. Switched to  Cymbalta, but did not feel better. Has been tried on Lexapro in the past, but she does not feel it ever helped. She does not believe she is depressed.  AF is controlled. Remains on flecainide and 12.5 mg metoprolol succinate. Also takes diltiazem ER 180 mg bid.  Has occassions she is aware of a change in the feelings of the substernal area. Not a pain or a palpitation. BP seems OK at those times. Not associated with nausea or light headed feeling.  Current Outpatient Prescriptions on File Prior to Visit  Medication Sig Dispense Refill  . acetaminophen (TYLENOL) 325 MG tablet Take 650 mg by mouth 2 (two) times daily as needed. For pain      . apixaban (ELIQUIS) 5 MG TABS tablet Take 5 mg by mouth 2 (two) times daily.      . flecainide (TAMBOCOR) 50 MG tablet Take 1 tablet (50 mg total) by mouth 2 (two) times daily.  180 tablet  3  . losartan (COZAAR) 100 MG tablet Take 1/2 tablet daily to control BP      . metoprolol succinate (TOPROL-XL) 50 MG 24 hr tablet Take 1/2 of 50 mg tablet to lower BP  90 tablet  3  . promethazine (PHENERGAN) 25 MG tablet Take 25 mg by mouth daily as needed. For nausea and vomiting      . zolpidem (AMBIEN) 10 MG tablet Take 10 mg by mouth at bedtime.       No current facility-administered medications on file prior to visit.      Review of Systems  Constitutional: Negative for fever, chills, activity change, appetite change and unexpected weight change.  HENT: Positive for hearing loss, ear pain, congestion, neck pain and neck stiffness.   Respiratory: Positive for shortness of breath. Negative for cough, choking and chest tightness.   Cardiovascular: Negative for chest pain,  palpitations and leg swelling.  Gastrointestinal: Positive for nausea. Negative for vomiting, abdominal pain, diarrhea, constipation and abdominal distention.  Endocrine: Negative for cold intolerance, heat intolerance, polydipsia, polyphagia and polyuria.  Musculoskeletal: Positive for back pain, arthralgias and gait problem.  Skin: Negative.   Neurological: Positive for tremors and weakness. Negative for dizziness, seizures, light-headedness, numbness and headaches.  Hematological: Negative.   Psychiatric/Behavioral: Positive for sleep disturbance.       Objective:BP 108/64  Pulse 72  Ht 5' 7.5" (1.715 m)  Wt 167 lb (75.751 kg)  BMI 25.75 kg/m2    Physical Exam  Constitutional: She is oriented to person, place, and time. She appears well-nourished. No distress.  HENT:  Head: Normocephalic and atraumatic.  Right Ear: External ear normal.  Left Ear: External ear normal.  Nose: Nose normal.  Eyes: Conjunctivae and EOM are normal. Pupils are equal, round, and reactive to light.  Prescription lenses.  Neck: Normal range of motion. Neck supple. No JVD present. No tracheal deviation present. No thyromegaly present.  Cardiovascular: Normal rate, regular rhythm, normal heart sounds and intact distal pulses.  Exam reveals no gallop and no friction rub.   No murmur heard. Pulmonary/Chest: Effort normal and breath sounds normal. No respiratory distress. She has no wheezes. She has no rales.  Abdominal: Soft.  Bowel sounds are normal. She exhibits no distension and no mass. There is no tenderness.  Musculoskeletal: Normal range of motion. She exhibits no edema and no tenderness.  Lymphadenopathy:    She has no cervical adenopathy.  Neurological: She is alert and oriented to person, place, and time. A cranial nerve deficit is present.  Skin: Skin is warm and dry. No rash noted. No erythema. No pallor.  Psychiatric: She has a normal mood and affect. Her behavior is normal. Thought content  normal.          Assessment & Plan:  1. Tremor  Unchanged. Presenting features are that of benign essential tremor. I am skeptical that her drugs induced this tremor, although this possibility cannot be fully ruled out.  2. Depression  Patient says she does not feel like she is depressed. She does not want to try more medications.  3. A-fib   Controlled  4. Hypertension   Controlled on current medications  5. Nausea alone  Patient agrees to resume cholestyramine as previously recommended by Dr. Matthias Hughs.

## 2013-02-02 ENCOUNTER — Ambulatory Visit (INDEPENDENT_AMBULATORY_CARE_PROVIDER_SITE_OTHER): Payer: Medicare Other | Admitting: Cardiology

## 2013-02-02 ENCOUNTER — Encounter: Payer: Self-pay | Admitting: Cardiology

## 2013-02-02 VITALS — BP 120/70 | HR 76 | Ht 67.0 in | Wt 165.0 lb

## 2013-02-02 DIAGNOSIS — F329 Major depressive disorder, single episode, unspecified: Secondary | ICD-10-CM | POA: Diagnosis not present

## 2013-02-02 DIAGNOSIS — I4891 Unspecified atrial fibrillation: Secondary | ICD-10-CM

## 2013-02-02 DIAGNOSIS — I10 Essential (primary) hypertension: Secondary | ICD-10-CM

## 2013-02-02 MED ORDER — METOPROLOL SUCCINATE ER 50 MG PO TB24: ORAL_TABLET | ORAL | Status: DC

## 2013-02-02 MED ORDER — FLECAINIDE ACETATE 50 MG PO TABS: 50.0000 mg | ORAL_TABLET | Freq: Two times a day (BID) | ORAL | Status: DC

## 2013-02-02 MED ORDER — LOSARTAN POTASSIUM 100 MG PO TABS: ORAL_TABLET | ORAL | Status: DC

## 2013-02-02 MED ORDER — APIXABAN 5 MG PO TABS: 5.0000 mg | ORAL_TABLET | Freq: Two times a day (BID) | ORAL | Status: DC

## 2013-02-02 MED ORDER — DILTIAZEM HCL ER COATED BEADS 180 MG PO CP24: 180.0000 mg | ORAL_CAPSULE | Freq: Two times a day (BID) | ORAL | Status: DC

## 2013-02-02 NOTE — Patient Instructions (Addendum)
Continue your current therapy  I will see you again in 6 months.   

## 2013-02-02 NOTE — Progress Notes (Signed)
[name] Date of Birth: Jul 31, 1933 Medical Record #[id number]  History of Present Illness: [name] is seen for follow up of atrial fibrillation. Since her last visit she has had only one short episode of atrial fibrillation. She was placed back on metoprolol 25 mg per day and has had no further arrhythmia. She did suffer a fall several weeks ago and had fairly extensive bruising on her left arm. She apparently tripped when she became tangled in her walker. She does complain of a dry cough. She is also developed a significant tremor. She states she developed this after being placed on mirtazapine.  Current Outpatient Prescriptions on File Prior to Visit  Medication Sig Dispense Refill  . acetaminophen (TYLENOL) 325 MG tablet Take 650 mg by mouth 2 (two) times daily as needed. For pain      . cholestyramine (QUESTRAN) 4 GM/DOSE powder Take 4 grams 3 times daily to help naussea.  378 g  12  . promethazine (PHENERGAN) 25 MG tablet Take 25 mg by mouth daily as needed. For nausea and vomiting      . zolpidem (AMBIEN) 10 MG tablet Take 10 mg by mouth at bedtime.       No current facility-administered medications on file prior to visit.    Allergies  Allergen Reactions  . Mirtazapine     tremor  . Nickel Other (See Comments)    inflammation  . Other     Prednisone causes sleep disturbances    Past Medical History  Diagnosis Date  . A-fib   . Hypertension   . Arthritis   . Cataracts, bilateral   . Other acariasis     peripherial neuropathy  . History of stomach ulcers   . GI bleed 06/2002  . UTI (lower urinary tract infection)     pt state uti w/ no pain  . PVC (premature ventricular contraction)   . Migraines   . Abdominal pain, unspecified site   . Diverticulosis of colon (without mention of hemorrhage)   . Hemorrhage of gastrointestinal tract, unspecified   . Unspecified hereditary and idiopathic peripheral neuropathy   . Osteoarthrosis, unspecified whether  generalized or localized, lower leg   . Palpitations   . Depression   . Other and unspecified alcohol dependence, continuous drinking behavior 10/28/2012  . Nausea alone 10/28/2012  . Obesity, unspecified 10/27/2012  . Diverticulosis of colon (without mention of hemorrhage) 10/27/2012  . Insomnia, unspecified 10/27/2012  . Edema 10/27/2012  . Localized osteoarthrosis not specified whether primary or secondary, lower leg 05/31/2011    Past Surgical History  Procedure Laterality Date  . Breast reduction surgery  12/23/2003  . Cholecystectomy  2005  . Cataracts    . Arthroscopic repair acl    . Tonsillectomy and adenoidectomy  1941  . Breast biopsy  1973    left    History  Smoking status  . Former Smoker  . Types: Cigarettes  . Quit date: 10/09/1983  Smokeless tobacco  . Never Used    History  Alcohol Use  . Yes    Comment: daily  stoch    History reviewed. No pertinent family history.  Review of Systems: The review of systems is positive for marked fatigue.  All other systems were reviewed and are negative.  Physical Exam: BP 120/70  Pulse 76  Ht 5\' 7"  (1.702 m)  Wt 165 lb (74.844 kg)  BMI 25.84 kg/m2 She is a pleasant, elderly white female in no acute distress. The patient  is alert and oriented x 3.    The skin is warm and dry.    The HEENT exam is normal. There is no JVD.  The lungs are clear.    The heart exam reveals an irregular rate with a normal S1 and S2.  There is a grade 1-2/6 diastolic murmur the right upper sternal border.  The PMI is not displaced.   Abdominal exam reveals good bowel sounds.  There is no guarding or rebound.  There is no hepatosplenomegaly or tenderness.  There are no masses.  Exam of the legs reveal no clubbing, cyanosis, or edema.  The legs are without rashes.  The distal pulses are intact.  Cranial nerves II - XII are intact.  Motor and sensory functions are intact.  The gait is normal.  LABORATORY DATA: ECG 10/16/12 shows NSR with a rate of 67  beats per minute. Otherwise normal.  Lab Results  Component Value Date   WBC 5.3 10/09/2012   HGB 13.7 10/09/2012   HCT 39.8 10/09/2012   PLT 178 10/09/2012   GLUCOSE 109* 10/10/2012   NA 134* 10/10/2012   K 4.0 10/10/2012   CL 97 10/10/2012   CREATININE 0.68 10/10/2012   BUN 11 10/10/2012   CO2 23 10/10/2012   TSH 2.024 10/09/2012   Assessment / Plan: 1. Atrial fibrillation- maintaining NSR on flecainide. Given multiple recurrences will continue 50 mg bid for maintenance. Continue diltiazem and low-dose metoprolol.  2. Moderate alcohol use. She has reduced her intake.  3. Hypertension, controlled.

## 2013-02-22 ENCOUNTER — Other Ambulatory Visit: Payer: Self-pay | Admitting: *Deleted

## 2013-02-22 MED ORDER — ZOLPIDEM TARTRATE 10 MG PO TABS: ORAL_TABLET | ORAL | Status: DC

## 2013-03-02 DIAGNOSIS — R269 Unspecified abnormalities of gait and mobility: Secondary | ICD-10-CM | POA: Diagnosis not present

## 2013-03-02 DIAGNOSIS — R110 Nausea: Secondary | ICD-10-CM | POA: Diagnosis not present

## 2013-03-02 DIAGNOSIS — Z136 Encounter for screening for cardiovascular disorders: Secondary | ICD-10-CM | POA: Diagnosis not present

## 2013-03-02 DIAGNOSIS — I10 Essential (primary) hypertension: Secondary | ICD-10-CM | POA: Diagnosis not present

## 2013-03-02 DIAGNOSIS — G252 Other specified forms of tremor: Secondary | ICD-10-CM | POA: Diagnosis not present

## 2013-03-02 DIAGNOSIS — R7989 Other specified abnormal findings of blood chemistry: Secondary | ICD-10-CM | POA: Diagnosis not present

## 2013-03-02 DIAGNOSIS — Z79899 Other long term (current) drug therapy: Secondary | ICD-10-CM | POA: Diagnosis not present

## 2013-03-02 DIAGNOSIS — R82998 Other abnormal findings in urine: Secondary | ICD-10-CM | POA: Diagnosis not present

## 2013-03-02 DIAGNOSIS — G479 Sleep disorder, unspecified: Secondary | ICD-10-CM | POA: Diagnosis not present

## 2013-03-02 DIAGNOSIS — G250 Essential tremor: Secondary | ICD-10-CM | POA: Diagnosis not present

## 2013-03-02 DIAGNOSIS — W19XXXA Unspecified fall, initial encounter: Secondary | ICD-10-CM | POA: Diagnosis not present

## 2013-03-09 DIAGNOSIS — R279 Unspecified lack of coordination: Secondary | ICD-10-CM | POA: Diagnosis not present

## 2013-03-09 DIAGNOSIS — M159 Polyosteoarthritis, unspecified: Secondary | ICD-10-CM | POA: Diagnosis not present

## 2013-03-09 DIAGNOSIS — M2550 Pain in unspecified joint: Secondary | ICD-10-CM | POA: Diagnosis not present

## 2013-03-09 DIAGNOSIS — R269 Unspecified abnormalities of gait and mobility: Secondary | ICD-10-CM | POA: Diagnosis not present

## 2013-03-09 DIAGNOSIS — Z9181 History of falling: Secondary | ICD-10-CM | POA: Diagnosis not present

## 2013-03-09 DIAGNOSIS — M6281 Muscle weakness (generalized): Secondary | ICD-10-CM | POA: Diagnosis not present

## 2013-03-10 DIAGNOSIS — M2550 Pain in unspecified joint: Secondary | ICD-10-CM | POA: Diagnosis not present

## 2013-03-10 DIAGNOSIS — M6281 Muscle weakness (generalized): Secondary | ICD-10-CM | POA: Diagnosis not present

## 2013-03-10 DIAGNOSIS — M159 Polyosteoarthritis, unspecified: Secondary | ICD-10-CM | POA: Diagnosis not present

## 2013-03-10 DIAGNOSIS — R279 Unspecified lack of coordination: Secondary | ICD-10-CM | POA: Diagnosis not present

## 2013-03-10 DIAGNOSIS — R269 Unspecified abnormalities of gait and mobility: Secondary | ICD-10-CM | POA: Diagnosis not present

## 2013-03-11 DIAGNOSIS — R269 Unspecified abnormalities of gait and mobility: Secondary | ICD-10-CM | POA: Diagnosis not present

## 2013-03-11 DIAGNOSIS — M159 Polyosteoarthritis, unspecified: Secondary | ICD-10-CM | POA: Diagnosis not present

## 2013-03-11 DIAGNOSIS — R279 Unspecified lack of coordination: Secondary | ICD-10-CM | POA: Diagnosis not present

## 2013-03-11 DIAGNOSIS — M6281 Muscle weakness (generalized): Secondary | ICD-10-CM | POA: Diagnosis not present

## 2013-03-11 DIAGNOSIS — M2550 Pain in unspecified joint: Secondary | ICD-10-CM | POA: Diagnosis not present

## 2013-03-12 DIAGNOSIS — R279 Unspecified lack of coordination: Secondary | ICD-10-CM | POA: Diagnosis not present

## 2013-03-12 DIAGNOSIS — M6281 Muscle weakness (generalized): Secondary | ICD-10-CM | POA: Diagnosis not present

## 2013-03-12 DIAGNOSIS — R269 Unspecified abnormalities of gait and mobility: Secondary | ICD-10-CM | POA: Diagnosis not present

## 2013-03-12 DIAGNOSIS — M2550 Pain in unspecified joint: Secondary | ICD-10-CM | POA: Diagnosis not present

## 2013-03-12 DIAGNOSIS — M159 Polyosteoarthritis, unspecified: Secondary | ICD-10-CM | POA: Diagnosis not present

## 2013-03-16 DIAGNOSIS — R279 Unspecified lack of coordination: Secondary | ICD-10-CM | POA: Diagnosis not present

## 2013-03-16 DIAGNOSIS — R269 Unspecified abnormalities of gait and mobility: Secondary | ICD-10-CM | POA: Diagnosis not present

## 2013-03-16 DIAGNOSIS — M6281 Muscle weakness (generalized): Secondary | ICD-10-CM | POA: Diagnosis not present

## 2013-03-16 DIAGNOSIS — M159 Polyosteoarthritis, unspecified: Secondary | ICD-10-CM | POA: Diagnosis not present

## 2013-03-16 DIAGNOSIS — M2550 Pain in unspecified joint: Secondary | ICD-10-CM | POA: Diagnosis not present

## 2013-03-18 DIAGNOSIS — M159 Polyosteoarthritis, unspecified: Secondary | ICD-10-CM | POA: Diagnosis not present

## 2013-03-18 DIAGNOSIS — R269 Unspecified abnormalities of gait and mobility: Secondary | ICD-10-CM | POA: Diagnosis not present

## 2013-03-18 DIAGNOSIS — R279 Unspecified lack of coordination: Secondary | ICD-10-CM | POA: Diagnosis not present

## 2013-03-18 DIAGNOSIS — M2550 Pain in unspecified joint: Secondary | ICD-10-CM | POA: Diagnosis not present

## 2013-03-18 DIAGNOSIS — M6281 Muscle weakness (generalized): Secondary | ICD-10-CM | POA: Diagnosis not present

## 2013-03-19 DIAGNOSIS — M2550 Pain in unspecified joint: Secondary | ICD-10-CM | POA: Diagnosis not present

## 2013-03-19 DIAGNOSIS — M159 Polyosteoarthritis, unspecified: Secondary | ICD-10-CM | POA: Diagnosis not present

## 2013-03-19 DIAGNOSIS — R269 Unspecified abnormalities of gait and mobility: Secondary | ICD-10-CM | POA: Diagnosis not present

## 2013-03-19 DIAGNOSIS — R279 Unspecified lack of coordination: Secondary | ICD-10-CM | POA: Diagnosis not present

## 2013-03-19 DIAGNOSIS — M6281 Muscle weakness (generalized): Secondary | ICD-10-CM | POA: Diagnosis not present

## 2013-03-22 DIAGNOSIS — R279 Unspecified lack of coordination: Secondary | ICD-10-CM | POA: Diagnosis not present

## 2013-03-22 DIAGNOSIS — M159 Polyosteoarthritis, unspecified: Secondary | ICD-10-CM | POA: Diagnosis not present

## 2013-03-22 DIAGNOSIS — M6281 Muscle weakness (generalized): Secondary | ICD-10-CM | POA: Diagnosis not present

## 2013-03-22 DIAGNOSIS — R269 Unspecified abnormalities of gait and mobility: Secondary | ICD-10-CM | POA: Diagnosis not present

## 2013-03-22 DIAGNOSIS — M2550 Pain in unspecified joint: Secondary | ICD-10-CM | POA: Diagnosis not present

## 2013-03-23 DIAGNOSIS — Z9181 History of falling: Secondary | ICD-10-CM | POA: Diagnosis not present

## 2013-03-23 DIAGNOSIS — R269 Unspecified abnormalities of gait and mobility: Secondary | ICD-10-CM | POA: Diagnosis not present

## 2013-03-23 DIAGNOSIS — M2550 Pain in unspecified joint: Secondary | ICD-10-CM | POA: Diagnosis not present

## 2013-03-23 DIAGNOSIS — R279 Unspecified lack of coordination: Secondary | ICD-10-CM | POA: Diagnosis not present

## 2013-03-23 DIAGNOSIS — M159 Polyosteoarthritis, unspecified: Secondary | ICD-10-CM | POA: Diagnosis not present

## 2013-03-23 DIAGNOSIS — M6281 Muscle weakness (generalized): Secondary | ICD-10-CM | POA: Diagnosis not present

## 2013-03-24 DIAGNOSIS — M6281 Muscle weakness (generalized): Secondary | ICD-10-CM | POA: Diagnosis not present

## 2013-03-24 DIAGNOSIS — M2550 Pain in unspecified joint: Secondary | ICD-10-CM | POA: Diagnosis not present

## 2013-03-24 DIAGNOSIS — R279 Unspecified lack of coordination: Secondary | ICD-10-CM | POA: Diagnosis not present

## 2013-03-24 DIAGNOSIS — M159 Polyosteoarthritis, unspecified: Secondary | ICD-10-CM | POA: Diagnosis not present

## 2013-03-24 DIAGNOSIS — R269 Unspecified abnormalities of gait and mobility: Secondary | ICD-10-CM | POA: Diagnosis not present

## 2013-03-29 ENCOUNTER — Non-Acute Institutional Stay: Payer: Medicare Other | Admitting: Internal Medicine

## 2013-03-29 ENCOUNTER — Encounter: Payer: Self-pay | Admitting: Internal Medicine

## 2013-03-29 VITALS — BP 128/68 | HR 72 | Ht 67.0 in | Wt 157.0 lb

## 2013-03-29 DIAGNOSIS — R110 Nausea: Secondary | ICD-10-CM

## 2013-03-29 DIAGNOSIS — R634 Abnormal weight loss: Secondary | ICD-10-CM | POA: Diagnosis not present

## 2013-03-29 DIAGNOSIS — I493 Ventricular premature depolarization: Secondary | ICD-10-CM

## 2013-03-29 DIAGNOSIS — R259 Unspecified abnormal involuntary movements: Secondary | ICD-10-CM

## 2013-03-29 DIAGNOSIS — R05 Cough: Secondary | ICD-10-CM | POA: Diagnosis not present

## 2013-03-29 DIAGNOSIS — R251 Tremor, unspecified: Secondary | ICD-10-CM

## 2013-03-29 DIAGNOSIS — I10 Essential (primary) hypertension: Secondary | ICD-10-CM | POA: Diagnosis not present

## 2013-03-29 DIAGNOSIS — F329 Major depressive disorder, single episode, unspecified: Secondary | ICD-10-CM

## 2013-03-29 DIAGNOSIS — I4949 Other premature depolarization: Secondary | ICD-10-CM

## 2013-03-29 NOTE — Progress Notes (Signed)
Subjective:    Patient ID: [name], female    DOB: 04/11/1933, 79 y.o.   MRN: [id number]  HPI  Cough: Complains of a dry cough. She never produces sputum. It does not seem to be associated with oral intake. There has been no fever. She has had chest x-rays on 2 occasions over the last year. Other than some chronic scarring at the lung bases, no abnormalities are shown. Her cough is relieved by using Hall's mentholyptus cough drops  Tremor: Patient says that the onset of her tremor worse at that time she was started on mirtazapine. It has not improved since toppings medication her tremor has features that appear to be most like a benign essential tremor. Her daughter who is with her today says that the tremor it did seem to have an acute onset. She is requesting a neurology referral.  Loss of weight: Patient has not been eating well. She has been losing weight. Her anorexia does not seem to be associated with any other particular GI symptoms at this time. At one point she had nausea, but it seems to be doing better.  Hypertension: Controlled  Depression: Patient still insists that she is not depressed. She does have certain features that suggest of depression such as she has become more isolated, she quit driving (which is probably a good thing), she has been somewhat confused, her appetite has been poor. She denies tearfulness.  PVC (premature ventricular contraction): Occasional skipped heartbeats. She does not feel that she is having any relapse of her atrial fibrillation.  Nausea alone: Improved. She takes it she should go back on using the cholestyramine. She has not been taking it recently.  Possible change in memory: Daughter says that she seems more confused and does not seem capable of keeping up with things as she did in the past. When pressed for examples, she cannot give any. Patient has not felt memory has been a problem.  Patient states that she "went rogue", and saw another  physician, Dr. Camillo Flaming. She had a "complete panel of lab work done". She was sent a copy of this. All results were reportedly normal. I do not access to this information today.    Review of Systems  Constitutional: Negative for fever, chills, activity change and appetite change.       Continues to lose weight.  HENT: Positive for hearing loss, ear pain, congestion, neck pain and neck stiffness.   Eyes: Negative.   Respiratory: Positive for shortness of breath. Negative for cough, choking and chest tightness.   Cardiovascular: Negative for chest pain, palpitations and leg swelling.  Gastrointestinal: Positive for nausea. Negative for vomiting, abdominal pain, diarrhea, constipation and abdominal distention.  Endocrine: Negative for cold intolerance, heat intolerance, polydipsia, polyphagia and polyuria.  Musculoskeletal: Positive for back pain, arthralgias and gait problem.  Skin: Negative.   Neurological: Positive for tremors and weakness. Negative for dizziness, seizures, light-headedness, numbness and headaches.       Generalized weakness without focal weakness.  Hematological: Negative.   Psychiatric/Behavioral: Positive for sleep disturbance.       Objective:   Physical Exam  Constitutional: She is oriented to person, place, and time. She appears well-nourished. No distress.  HENT:  Head: Normocephalic and atraumatic.  Right Ear: External ear normal.  Left Ear: External ear normal.  Nose: Nose normal.  Eyes: Conjunctivae and EOM are normal. Pupils are equal, round, and reactive to light.  Prescription lenses.  Neck: Normal range of motion. Neck  supple. No JVD present. No tracheal deviation present. No thyromegaly present.  Cardiovascular: Normal rate, regular rhythm, normal heart sounds and intact distal pulses.  Exam reveals no gallop and no friction rub.   No murmur heard. Pulmonary/Chest: Effort normal and breath sounds normal. No respiratory distress. She has no  wheezes. She has no rales.  Abdominal: Soft. Bowel sounds are normal. She exhibits no distension and no mass. There is no tenderness.  Musculoskeletal: Normal range of motion. She exhibits no edema and no tenderness.  Lymphadenopathy:    She has no cervical adenopathy.  Neurological: She is alert and oriented to person, place, and time. A cranial nerve deficit is present.  Significant tremor at rest. Picture seemed most likely to be benign essential tremor, I cannot completely rule out parkinsonism. She does not have rigidity. Glabellar response is normal.  Skin: Skin is warm and dry. No rash noted. No erythema. No pallor.  Psychiatric: She has a normal mood and affect. Her behavior is normal. Thought content normal.          Assessment & Plan:  Cough  Offered to get respiratory consult, but she prefers not to do this yet.  Tremor  Neurology consultation requested  Loss of weight  Etiology not fully determined, but seems to be related to insufficient caloric intake. Previously nauseous, but this may be a little bit better.  Hypertension: Controlled  Depression: I discussed the possibility of starting medications, but she still prefers not to do this.  PVC (premature ventricular contraction): Most likely accounts for skipped heart beats.  Nausea alone: Improved. I suggested she resume the cholestyramine as was previously recommended by Dr. Matthias Hughs.

## 2013-03-29 NOTE — Patient Instructions (Signed)
Continue current medications. 

## 2013-03-30 DIAGNOSIS — R269 Unspecified abnormalities of gait and mobility: Secondary | ICD-10-CM | POA: Diagnosis not present

## 2013-03-30 DIAGNOSIS — M2550 Pain in unspecified joint: Secondary | ICD-10-CM | POA: Diagnosis not present

## 2013-03-30 DIAGNOSIS — M159 Polyosteoarthritis, unspecified: Secondary | ICD-10-CM | POA: Diagnosis not present

## 2013-03-30 DIAGNOSIS — M6281 Muscle weakness (generalized): Secondary | ICD-10-CM | POA: Diagnosis not present

## 2013-03-30 DIAGNOSIS — R279 Unspecified lack of coordination: Secondary | ICD-10-CM | POA: Diagnosis not present

## 2013-04-01 DIAGNOSIS — M159 Polyosteoarthritis, unspecified: Secondary | ICD-10-CM | POA: Diagnosis not present

## 2013-04-01 DIAGNOSIS — R269 Unspecified abnormalities of gait and mobility: Secondary | ICD-10-CM | POA: Diagnosis not present

## 2013-04-01 DIAGNOSIS — M2550 Pain in unspecified joint: Secondary | ICD-10-CM | POA: Diagnosis not present

## 2013-04-01 DIAGNOSIS — R279 Unspecified lack of coordination: Secondary | ICD-10-CM | POA: Diagnosis not present

## 2013-04-01 DIAGNOSIS — M6281 Muscle weakness (generalized): Secondary | ICD-10-CM | POA: Diagnosis not present

## 2013-04-05 ENCOUNTER — Telehealth: Payer: Self-pay

## 2013-04-05 NOTE — Telephone Encounter (Signed)
Faxed office notes, patient info to [facility] for referral tremors. Kaylyn Layer, CMA

## 2013-04-06 DIAGNOSIS — M6281 Muscle weakness (generalized): Secondary | ICD-10-CM | POA: Diagnosis not present

## 2013-04-06 DIAGNOSIS — R279 Unspecified lack of coordination: Secondary | ICD-10-CM | POA: Diagnosis not present

## 2013-04-06 DIAGNOSIS — M159 Polyosteoarthritis, unspecified: Secondary | ICD-10-CM | POA: Diagnosis not present

## 2013-04-06 DIAGNOSIS — M2550 Pain in unspecified joint: Secondary | ICD-10-CM | POA: Diagnosis not present

## 2013-04-06 DIAGNOSIS — R269 Unspecified abnormalities of gait and mobility: Secondary | ICD-10-CM | POA: Diagnosis not present

## 2013-04-14 DIAGNOSIS — R269 Unspecified abnormalities of gait and mobility: Secondary | ICD-10-CM | POA: Diagnosis not present

## 2013-04-14 DIAGNOSIS — M159 Polyosteoarthritis, unspecified: Secondary | ICD-10-CM | POA: Diagnosis not present

## 2013-04-14 DIAGNOSIS — M6281 Muscle weakness (generalized): Secondary | ICD-10-CM | POA: Diagnosis not present

## 2013-04-14 DIAGNOSIS — R279 Unspecified lack of coordination: Secondary | ICD-10-CM | POA: Diagnosis not present

## 2013-04-14 DIAGNOSIS — M2550 Pain in unspecified joint: Secondary | ICD-10-CM | POA: Diagnosis not present

## 2013-04-27 ENCOUNTER — Encounter: Payer: Self-pay | Admitting: Neurology

## 2013-04-27 ENCOUNTER — Ambulatory Visit (INDEPENDENT_AMBULATORY_CARE_PROVIDER_SITE_OTHER): Payer: Medicare Other | Admitting: Neurology

## 2013-04-27 VITALS — BP 145/91 | HR 78 | Temp 98.3°F | Ht 67.5 in | Wt 150.0 lb

## 2013-04-27 DIAGNOSIS — R259 Unspecified abnormal involuntary movements: Secondary | ICD-10-CM

## 2013-04-27 DIAGNOSIS — R270 Ataxia, unspecified: Secondary | ICD-10-CM

## 2013-04-27 DIAGNOSIS — R279 Unspecified lack of coordination: Secondary | ICD-10-CM | POA: Diagnosis not present

## 2013-04-27 DIAGNOSIS — R251 Tremor, unspecified: Secondary | ICD-10-CM

## 2013-04-27 NOTE — Progress Notes (Signed)
Subjective:    Patient ID: [name] is a 79 y.o. female.  HPI  Huston Foley, MD, PhD [facility] [address] [address] [place], [place] [address]  Dear Dr. Chilton Si,  I saw your patient, [name], upon your kind request in my neurologic clinic today for initial consultation of her tremors. The patient is unaccompanied today. As you know [name] is a very pleasant 79 year old right-handed woman with an underlying medical history of hypertension, PVCs, paroxysmal atrial fibrillation, and depression who has noted a upper extremity tremor for the past several months. This apparently became worse after she started Remeron. She's had weight loss of unclear etiology. She reports having a tremor for the past 6 months affecting both UEs and her head and neck and with fluctuation in intensity. She stopped the Remeron after a few weeks and the tremor did not improve. She was then tried on Cymbalta for depression and stopped this as well. In September she was tried on Lexapro, but did not have a tremor at the time. No FHx of PD, no tremor Hx in the Family either. She is a retired [employer] fund[employer] and denies exposure to toxins, or chemicals. She drinks one drink of alcohol, but had been drinking 2-3 or more drinks daily for decades before she started cutting back, but she may still drink 1-2 drinks each night AND takes Ambien. She drinks Scotch. She drives a little. She is currently on Phenergan as needed, Ambien as needed, diltiazem, Flecainide, losartan, metoprolol, multivitamin and Eliquis.  Alcohol has helped her tremors. She was told to cut back on her drinking because she has had several falls. She now mobilizes with a rolling walker for any distance. At home she sometimes holds onto things. She feels that she is addicted to the Ambien. Her stepdaughter adds that she has had a very hard time adjusting to life since her husband died some 10 years ago. She resides at [facility] in the  independent living section but has stopped playing bridge and the with friends. She hardly socializes. She has a cat. She has had suicidal ideations in the past. She has seen a psychiatrist and has tried multiple antidepressants but for some reason or another she stops the medications. She denies depression  Her Past Medical History Is Significant For: Past Medical History  Diagnosis Date  . A-fib   . Hypertension   . Arthritis   . Cataracts, bilateral   . Other acariasis     peripherial neuropathy  . History of stomach ulcers   . GI bleed 06/2002  . UTI (lower urinary tract infection)     pt state uti w/ no pain  . PVC (premature ventricular contraction)   . Migraines   . Abdominal pain, unspecified site   . Diverticulosis of colon (without mention of hemorrhage)   . Hemorrhage of gastrointestinal tract, unspecified   . Unspecified hereditary and idiopathic peripheral neuropathy   . Osteoarthrosis, unspecified whether generalized or localized, lower leg   . Palpitations   . Depression   . Other and unspecified alcohol dependence, continuous drinking behavior 10/28/2012  . Nausea alone 10/28/2012  . Obesity, unspecified 10/27/2012  . Diverticulosis of colon (without mention of hemorrhage) 10/27/2012  . Insomnia, unspecified 10/27/2012  . Edema 10/27/2012  . Localized osteoarthrosis not specified whether primary or secondary, lower leg 05/31/2011    Her Past Surgical History Is Significant For: Past Surgical History  Procedure Laterality Date  . Breast reduction surgery  12/23/2003  . Cholecystectomy  2005  . Cataracts    . Arthroscopic repair acl    . Tonsillectomy and adenoidectomy  1941  . Breast biopsy  1973    left    Her Family History Is Significant For: No family history on file.  Her Social History Is Significant For: History   Social History  . Marital Status: Widowed    Spouse Name: N/A    Number of Children: N/A  . Years of Education: N/A   Social History Main  Topics  . Smoking status: Former Smoker    Types: Cigarettes    Quit date: 10/09/1983  . Smokeless tobacco: Never Used  . Alcohol Use: 0.0 oz/week    1-2 Shots of liquor per week     Comment: daily  scotch   . Drug Use: No  . Sexually Active: No   Other Topics Concern  . None   Social History Narrative  . None    Her Allergies Are:  Allergies  Allergen Reactions  . Mirtazapine     tremor  . Nickel Other (See Comments)    inflammation  . Other     Prednisone causes sleep disturbances  :   Her Current Medications Are:  Outpatient Encounter Prescriptions as of 04/27/2013  Medication Sig Dispense Refill  . acetaminophen (TYLENOL) 325 MG tablet Take 650 mg by mouth 2 (two) times daily as needed. For pain      . apixaban (ELIQUIS) 5 MG TABS tablet Take 1 tablet (5 mg total) by mouth 2 (two) times daily.  180 tablet  3  . cholestyramine (QUESTRAN) 4 GM/DOSE powder Take 4 grams 3 times daily to help naussea.  378 g  12  . diltiazem (CARDIZEM CD) 180 MG 24 hr capsule Take 1 capsule (180 mg total) by mouth 2 (two) times daily.  180 capsule  3  . flecainide (TAMBOCOR) 50 MG tablet Take 1 tablet (50 mg total) by mouth 2 (two) times daily.  180 tablet  3  . losartan (COZAAR) 100 MG tablet Take 1/2 tablet daily to control BP  45 tablet  3  . metoprolol succinate (TOPROL-XL) 50 MG 24 hr tablet Take 1/2 of 50 mg tablet to lower BP  45 tablet  3  . Multiple Vitamin (MULTIVITAMIN) tablet Take 1 tablet by mouth daily.      . promethazine (PHENERGAN) 25 MG tablet Take 25 mg by mouth daily as needed. For nausea and vomiting      . zolpidem (AMBIEN) 10 MG tablet Take one tablet by mouth at bedtime as needed for sleep  30 tablet  0   No facility-administered encounter medications on file as of 04/27/2013.  : Review of Systems  Constitutional: Positive for chills, activity change, appetite change, fatigue and unexpected weight change.  HENT: Positive for tinnitus.   Respiratory: Positive for  shortness of breath.        Cough  Gastrointestinal: Positive for constipation.       Diarrhea  Endocrine: Positive for cold intolerance.  Musculoskeletal: Positive for arthralgias.  Neurological: Positive for tremors (Worsen when cold) and weakness.       Memory loss  Hematological: Bruises/bleeds easily.  Psychiatric/Behavioral: Positive for confusion and dysphoric mood.    Objective:  Neurologic Exam  Physical Exam Physical Examination:   Filed Vitals:   04/27/13 1423  BP: 145/91  Pulse: 78  Temp: 98.3 F (36.8 C)    General Examination: The patient is a very pleasant 79  y.o. female in no acute distress. She appears frail and mildly anxious and is well groomed.   HEENT: Normocephalic, atraumatic, pupils are equal, round and reactive to light and accommodation. Extraocular tracking is good without limitation to gaze excursion or nystagmus noted. Normal smooth pursuit is noted. Hearing is grossly intact. Tympanic membranes are clear bilaterally. Face is symmetric with normal facial animation and normal facial sensation. Speech is clear with no dysarthria noted. There is no hypophonia. There is a mild head and neck tremor. Neck is supple with full range of passive and active motion. There are no carotid bruits on auscultation. Oropharynx exam reveals: mild mouth dryness, adequate dental hygiene and no significant airway crowding. Mallampati is class II. Tongue protrudes centrally and palate elevates symmetrically.   Chest: Clear to auscultation without wheezing, rhonchi or crackles noted.  Heart: S1+S2+0, regular and normal without murmurs, rubs or gallops noted.   Abdomen: Soft, non-tender and non-distended with normal bowel sounds appreciated on auscultation.  Extremities: There is no pitting edema in the distal lower extremities bilaterally. Pedal pulses are intact.  Skin: Warm and dry without trophic changes noted. There are no varicose veins.  Musculoskeletal: exam  reveals no obvious joint deformities, tenderness or joint swelling or erythema.   Neurologically:  Mental status: The patient is awake, alert and oriented in all 4 spheres. Her memory, attention, language and knowledge are mildly impaired. There is no aphasia, agnosia, apraxia or anomia. Speech is clear with normal prosody and enunciation. Thought process is linear. Mood is congruent and affect is blunted.  Cranial nerves are as described above under HEENT exam. In addition, shoulder shrug is normal with equal shoulder height noted. Motor exam: Normal bulk, strength and tone is noted. There is no drift, or rebound. She has a mild to moderate resting, action and postural tremor in both UEs. Romberg is negative. Reflexes are 1+ throughout. Fine motor skills are mildly impaired in the UE and LEs bilaterally. On Archimedes spiral drawing she has mild tremulousness and her handwriting is tremulous and not micrographic and legible.   Cerebellar testing shows very mild dysmetria and intention tremor.  Sensory exam is intact to light touch.  Gait, station and balance: She stands up with mild difficulty and has a wide-based stance. She is able to take a few steps without her walker but is unsteady and walks ataxic with wide-based gait. Tandem walk is not possible. She has trouble turning. Her balance is clearly impaired.               Assessment and Plan:   Assessment and Plan:  In summary, Paizleigh S Mccaig is a very pleasant 79 y.o.-year old female with a history of tremors affecting her hands and her head. She appears to have findings consistent with essential tremor. I believe that she probably has had a longer standing history of tremor but given her history of long-standing alcohol use it may have suppressed at 2 point work was not very apparent. It may not have been until she started cutting back on her alcohol use at this tremor became apparent to her. She relates this to being on Remeron but I doubt that  Remeron actually started this tremor. Antidepressants sometimes do exacerbate tremors occurring tremors out. She has no signs of parkinsonism at this time. I had a very long chat with her and her stepdaughter today regarding her alcohol consumption and am noticing that she has gait ataxia which is probably from chronic alcohol consumption as well.  I offered her brain MRI which she declined at this time. Symptomatic treatment-wise I am reluctant to place her on any medication for fear of medication interaction and unsafe interaction with alcohol. For example we sometimes utilize primidone for tremor control but it does not and should not be mixed with alcohol and she is not ready to quit drinking and it will be a gradual process since she has been drinking for a long time. She is nevertheless advised to gradually continue to decrease her alcohol consumption. She clearly states that she will not be able to come off of Ambien. She is however advised that Ambien alcohol should not be mixed either. This was a difficult conversation for [place] but I am sure she understands. I do think depression is a big player in her life, probably more than she realizes now. I would highly recommend reestablishing care with a psychiatrist. At this juncture I have asked her to meet back with you and discuss what we discussed today and see if she can gradually discontinue drinking alcohol as it is not safe to combine with Ambien. She is advised to followup with a psychiatrist and try to get her depression treated. I have advised her to think about the MRI and we can always order a dark you can order it. She can follow up with me on an as-needed basis. She was in agreement. She had blood work through Dr. Juluis Rainier. This was negative per patient's report.  Thank you very much for allowing me to participate in the care of this nice patient. If I can be of any further assistance to you please do not hesitate to call me at  [phone].  Sincerely,   Huston Foley, MD, PhD

## 2013-04-27 NOTE — Patient Instructions (Signed)
  Please remember, that any kind of tremor may be exacerbated by anxiety, nervousness, excitement, dehydration, sleep deprivation, and low blood sugar values.   Please continue to cut back on your alcohol use.

## 2013-05-17 ENCOUNTER — Encounter: Payer: Self-pay | Admitting: Internal Medicine

## 2013-05-17 ENCOUNTER — Non-Acute Institutional Stay: Payer: Medicare Other | Admitting: Internal Medicine

## 2013-05-17 VITALS — BP 118/62 | HR 60 | Ht 67.5 in | Wt 149.0 lb

## 2013-05-17 DIAGNOSIS — R251 Tremor, unspecified: Secondary | ICD-10-CM

## 2013-05-17 DIAGNOSIS — I4891 Unspecified atrial fibrillation: Secondary | ICD-10-CM | POA: Diagnosis not present

## 2013-05-17 DIAGNOSIS — F329 Major depressive disorder, single episode, unspecified: Secondary | ICD-10-CM | POA: Diagnosis not present

## 2013-05-17 DIAGNOSIS — I10 Essential (primary) hypertension: Secondary | ICD-10-CM

## 2013-05-17 DIAGNOSIS — R259 Unspecified abnormal involuntary movements: Secondary | ICD-10-CM

## 2013-05-17 DIAGNOSIS — R110 Nausea: Secondary | ICD-10-CM

## 2013-05-17 DIAGNOSIS — R634 Abnormal weight loss: Secondary | ICD-10-CM

## 2013-05-17 NOTE — Progress Notes (Signed)
Subjective:    Patient ID: [name], female    DOB: [birth year], 79 y.o.   MRN: [id number]  HPI  Patient is not feeling much different as compared to prior visits.  Depression: Patient does not feel like she is depressed. Current activities sure to be socially isolated. She doesn't go out. She used to play breath but doesn't any more. She is somewhat morose about her present condition. She continues to lose weight. She is not following directions reliably in regards to trying feet more or take the cholestyramine. She has a caretaker at night to make sure that she doesn't fall, but she refuses to call her for assistance when she gets up to urinate. She has fallen, as a result, at the bedside on her feet got kind of a neuro.  A-fib: Chronic. She says that the drugs that control her atrial fibrillation made her more nauseous. He currently is on flecainide and metoprolol.  Hypertension: Controlled.  Tremor: Saw neurologist, Dr. Huston Foley, who believes that she has a benign essential tremor. The low dose of her metoprolol has not helped. She has not been tried on primidone. She notes the alcohol does improve the tremor temporarily. She claims that Dr. Huston Foley told her to never drink alcohol again. She says she intends to continue to drink modest amounts of alcohol in the evening.  Nausea alone: Nausea persists. It is not aggravated by her alcohol. She has not used the cholestyramine as has been requested and Dr. Matthias Hughs and by me on her last 2 visits with me. She says that she is finding it difficult to time taking this medication because of other medicines that she takes. She was told that she should not take the cholestyramine along with the medications for her heart.  Loss of weight: Patient gets up late and eats very little.. She skips lunch because she "has no appetite".    Current Outpatient Prescriptions on File Prior to Visit  Medication Sig Dispense Refill  . acetaminophen  (TYLENOL) 325 MG tablet Take 650 mg by mouth 2 (two) times daily as needed. For pain      . apixaban (ELIQUIS) 5 MG TABS tablet Take 1 tablet (5 mg total) by mouth 2 (two) times daily.  180 tablet  3  . cholestyramine (QUESTRAN) 4 GM/DOSE powder Take 4 grams 3 times daily to help naussea.  378 g  12  . diltiazem (CARDIZEM CD) 180 MG 24 hr capsule Take 1 capsule (180 mg total) by mouth 2 (two) times daily.  180 capsule  3  . flecainide (TAMBOCOR) 50 MG tablet Take 1 tablet (50 mg total) by mouth 2 (two) times daily.  180 tablet  3  . losartan (COZAAR) 100 MG tablet Take 1/2 tablet daily to control BP  45 tablet  3  . metoprolol succinate (TOPROL-XL) 50 MG 24 hr tablet Take 1/2 of 50 mg tablet to lower BP  45 tablet  3  . Multiple Vitamin (MULTIVITAMIN) tablet Take 1 tablet by mouth daily.      . promethazine (PHENERGAN) 25 MG tablet Take 25 mg by mouth daily as needed. For nausea and vomiting      . zolpidem (AMBIEN) 10 MG tablet Take one tablet by mouth at bedtime as needed for sleep  30 tablet  0   No current facility-administered medications on file prior to visit.     Review of Systems  Constitutional: Negative for fever, chills, activity change and appetite change.  Continues to lose weight.  HENT: Positive for hearing loss, ear pain, congestion, neck pain and neck stiffness.   Eyes: Negative.   Respiratory: Positive for shortness of breath. Negative for cough, choking and chest tightness.   Cardiovascular: Negative for chest pain, palpitations and leg swelling.  Gastrointestinal: Positive for nausea. Negative for vomiting, abdominal pain, diarrhea, constipation and abdominal distention.  Endocrine: Negative for cold intolerance, heat intolerance, polydipsia, polyphagia and polyuria.  Musculoskeletal: Positive for back pain, arthralgias and gait problem.  Skin: Negative.   Neurological: Positive for tremors and weakness. Negative for dizziness, seizures, light-headedness, numbness  and headaches.       Generalized weakness without focal weakness.  Hematological: Negative.   Psychiatric/Behavioral: Positive for sleep disturbance.       Objective:BP 118/62  Pulse 60  Ht 5' 7.5" (1.715 m)  Wt 149 lb (67.586 kg)  BMI 22.98 kg/m2    Physical Exam  Constitutional: She is oriented to person, place, and time. She appears well-nourished. No distress.  HENT:  Head: Normocephalic and atraumatic.  Right Ear: External ear normal.  Left Ear: External ear normal.  Nose: Nose normal.  Eyes: Conjunctivae and EOM are normal. Pupils are equal, round, and reactive to light.  Prescription lenses.  Neck: Normal range of motion. Neck supple. No JVD present. No tracheal deviation present. No thyromegaly present.  Cardiovascular: Normal rate, regular rhythm, normal heart sounds and intact distal pulses.  Exam reveals no gallop and no friction rub.   No murmur heard. Pulmonary/Chest: Effort normal and breath sounds normal. No respiratory distress. She has no wheezes. She has no rales.  Abdominal: Soft. Bowel sounds are normal. She exhibits no distension and no mass. There is no tenderness.  Musculoskeletal: Normal range of motion. She exhibits no edema and no tenderness.  Lymphadenopathy:    She has no cervical adenopathy.  Neurological: She is alert and oriented to person, place, and time. A cranial nerve deficit is present.  Significant tremor at rest. Benign essential tremor is the most likely diagnosis. She does not have rigidity. Glabellar response is normal.  Skin: Skin is warm and dry. No rash noted. No erythema. No pallor.  Psychiatric: She has a normal mood and affect. Her behavior is normal. Thought content normal.  Patient's affect is bright and conversant. She is not withdrawn during today's visit.          Assessment & Plan:  Depression: Patient has been tried on various medications in the past including Lexapro and mirtazapine. She has not believe that that helped  her in any way. She does not want to consider taking a different medication at this time.  A-fib: Seems to be in a normal rhythm at present time. Currently taking diltiazem, flecainide, and metoprolol to control rate and rhythm. She also uses losartan for blood pressure.  Hypertension: See drugs listed above. Currently controlled.  Tremor: Lengthy discussion about the nature of her tremor. I mentioned primidone, but elected not to try that at this time. This patient is very skeptical about any new drugs. We will discuss it again at the next visit.  Nausea alone: Etiology undetermined. She has not tried the cholestyramine that was recommended. She says that she will try to do better and start taking it at least once daily before lunch or before supper.  Loss of weight: Weight loss is related to caloric insufficiency. Lengthy discussion was held about trying to eat something for lunch.

## 2013-05-17 NOTE — Patient Instructions (Signed)
Try using cholestyramine as was recommended. Eat something for lunch. We will discuss depression and your tremor again at the next visit.

## 2013-05-19 DIAGNOSIS — R109 Unspecified abdominal pain: Secondary | ICD-10-CM | POA: Diagnosis not present

## 2013-05-19 DIAGNOSIS — N390 Urinary tract infection, site not specified: Secondary | ICD-10-CM | POA: Diagnosis not present

## 2013-05-20 ENCOUNTER — Other Ambulatory Visit: Payer: Self-pay

## 2013-05-20 ENCOUNTER — Telehealth: Payer: Self-pay

## 2013-05-20 DIAGNOSIS — N390 Urinary tract infection, site not specified: Secondary | ICD-10-CM

## 2013-05-20 MED ORDER — CIPROFLOXACIN HCL 500 MG PO TABS: 500.0000 mg | ORAL_TABLET | Freq: Two times a day (BID) | ORAL | Status: DC

## 2013-05-20 MED ORDER — SACCHAROMYCES BOULARDII 250 MG PO CAPS: ORAL_CAPSULE | ORAL | Status: DC

## 2013-05-20 NOTE — Telephone Encounter (Signed)
Patient called [facility] 05/19/13, wanted urine test for UTI, having urine frequency, urgency, some low abdominal pain, mild burning with urination. Receive Urine results, postive Nitrite, Wbc >50, blood large, rare bacteria. Culture sensitivity still pending. Show urine test to Dr. Renato Gails. Faxed Rx Cipro 500mg  bi x 3 days; Florastor 250mg  bid x7 days to [facility]. Called Mrs. Borelli and informed her. When culture comes in 05/21/13 if it shows that Cipro not correct Rx I will call her. Kaylyn Layer, CMA

## 2013-05-21 DIAGNOSIS — N390 Urinary tract infection, site not specified: Secondary | ICD-10-CM | POA: Insufficient documentation

## 2013-05-26 ENCOUNTER — Non-Acute Institutional Stay: Payer: Medicare Other | Admitting: Geriatric Medicine

## 2013-05-26 ENCOUNTER — Encounter: Payer: Self-pay | Admitting: Geriatric Medicine

## 2013-05-26 VITALS — BP 122/60 | HR 64 | Wt 148.0 lb

## 2013-05-26 DIAGNOSIS — N390 Urinary tract infection, site not specified: Secondary | ICD-10-CM | POA: Diagnosis not present

## 2013-05-26 MED ORDER — CIPROFLOXACIN HCL 250 MG PO TABS
250.0000 mg | ORAL_TABLET | Freq: Two times a day (BID) | ORAL | Status: AC
Start: 2013-05-26 — End: 2013-05-30

## 2013-05-26 NOTE — Progress Notes (Signed)
Patient ID: [name], female   DOB: 11/14/1932, 79 y.o.   MRN: [id number] [facility] [phone])  Code Status: Living Will  Contact Information   Name Relation Home Work Huckabay Relative [phone]  [phone]   Orland Jarred   [phone]       Chief Complaint  Patient presents with  . Medical Managment of Chronic Issues    Follow up UTI--Thinks she needs more than just 2 days worth. Still having urgency and leakage    HPI: This is a 79 y.o. female resident of [facility]  [facility].  Evaluation is requested today due to persistent urinary tract symptoms. Last week, 8/27, patient requested urinalysis due to urine frequency, urgency, some low abdominal pain, mild burning with urination. U/A with evidence of infection, Dr. Renato Gails. Rx Cipro 500mg  bi x 3 days; Florastor 250mg  bid x7 days Patient reports she only received 4 pills of Cipro- took them as directed. Still has urinary frequency and incontinence.    Allergies  Allergen Reactions  . Mirtazapine     tremor  . Nickel Other (See Comments)    inflammation  . Other     Prednisone causes sleep disturbances   Medications    Medication List       This list is accurate as of: 05/26/13 12:17 PM.  Always use your most recent med list.               acetaminophen 325 MG tablet  Commonly known as:  TYLENOL  Take 650 mg by mouth 2 (two) times daily as needed. For pain     apixaban 5 MG Tabs tablet  Commonly known as:  ELIQUIS  Take 1 tablet (5 mg total) by mouth 2 (two) times daily.     cholestyramine 4 GM/DOSE powder  Commonly known as:  QUESTRAN  Take 4 grams 3 times daily to help naussea.     ciprofloxacin 250 MG tablet  Commonly known as:  CIPRO  Take 1 tablet (250 mg total) by mouth 2 (two) times daily.     diltiazem 180 MG 24 hr capsule  Commonly known as:  CARDIZEM CD  Take 1 capsule (180 mg total) by mouth 2 (two) times  daily.     flecainide 50 MG tablet  Commonly known as:  TAMBOCOR  Take 1 tablet (50 mg total) by mouth 2 (two) times daily.     losartan 100 MG tablet  Commonly known as:  COZAAR  Take 1/2 tablet daily to control BP     metoprolol succinate 50 MG 24 hr tablet  Commonly known as:  TOPROL-XL  Take 1/2 of 50 mg tablet to lower BP     multivitamin tablet  Take 1 tablet by mouth daily.     promethazine 25 MG tablet  Commonly known as:  PHENERGAN  Take 25 mg by mouth daily as needed. For nausea and vomiting     saccharomyces boulardii 250 MG capsule  Commonly known as:  FLORASTOR  Take one tablet twice daily to promote intestinal health     zolpidem 10 MG tablet  Commonly known as:  AMBIEN  Take one tablet by mouth at bedtime as needed for sleep        DATA REVIEWED  Radiologic Exams:   Cardiovascular Exams:   Laboratory Studies:    [facility] 05/22/13 urine culture: COLONY COUNT:  >=100,000 COLONIES/ML   FINAL REPORT      ESCHERICHIA  COLI   Review of Systems  DATA OBTAINED: from patient, GENERAL: Feels "OK"ell   No fever. - no improvement in appetite, still losing weight MOUTH/THROAT: No mouth or tooth pain  No sore throat No difficulty chewing or swallowing RESPIRATORY: No cough, wheezing, SOB CARDIAC: No chest pain, palpitations  No edema. GI: No abdominal pain   No heartburn or reflux   Nausea unchanged, bowels fluctuate between diarrhea/ constipaiton GU:  Urinary urgency, incontinence NEUROLOGIC: No dizziness, fainting, headache,  No change in mental status.  PSYCHIATRIC: No feelings of anxiety, depression Sleeps well.    Physical Exam Filed Vitals:   05/26/13 1153  BP: 122/60  Pulse: 64  Weight: 148 lb (67.132 kg)   Body mass index is 22.82 kg/(m^2).  GENERAL APPEARANCE: No acute distress, appropriately groomed, normal body habitus. Alert, pleasant, conversant. EYES: Conjunctiva/lids clear.  EARS: Hearing grossly normal. NOSE: No deformity or  discharge. RESPIRATORY: Breathing is even, unlabored. Lung sounds are clear and full.  CARDIOVASCULAR: Heart RRR. No murmur or extra heart sounds GASTROINTESTINAL: Abdomen is soft, non-tender, not distended w/ normal bowel sounds. GENITOURINARY: Bladder non tender, not distended. MUSCULOSKELETAL:  Gait is unsteady, slow to start, better with walker NEUROLOGIC: Oriented to time, place, person.  PSYCHIATRIC: Mood and affect appropriate to situation  ASSESSMENT/PLAN  UTI (urinary tract infection) Pt. With persistent urinary tract symptoms after short treatment with Cipro- unsure why only 4 tablets were dispensed. Will treat for additional 5 days - if symptoms persist- repeat urine culture    Follow up: Keep scheduled appt w/ Dr. Annamary Carolin T.Cambrie Sonnenfeld, NP-C 05/26/2013

## 2013-05-26 NOTE — Assessment & Plan Note (Signed)
Pt. With persistent urinary tract symptoms after short treatment with Cipro- unsure why only 4 tablets were dispensed. Will treat for additional 5 days - if symptoms persist- repeat urine culture

## 2013-06-02 ENCOUNTER — Other Ambulatory Visit: Payer: Self-pay | Admitting: Internal Medicine

## 2013-06-02 ENCOUNTER — Non-Acute Institutional Stay: Payer: Medicare Other | Admitting: Geriatric Medicine

## 2013-06-02 ENCOUNTER — Encounter: Payer: Self-pay | Admitting: Geriatric Medicine

## 2013-06-02 ENCOUNTER — Other Ambulatory Visit: Payer: Self-pay | Admitting: Geriatric Medicine

## 2013-06-02 VITALS — BP 100/62 | HR 68 | Temp 95.6°F | Ht 67.5 in | Wt 147.0 lb

## 2013-06-02 DIAGNOSIS — G4700 Insomnia, unspecified: Secondary | ICD-10-CM

## 2013-06-02 DIAGNOSIS — F1020 Alcohol dependence, uncomplicated: Secondary | ICD-10-CM

## 2013-06-02 DIAGNOSIS — N390 Urinary tract infection, site not specified: Secondary | ICD-10-CM

## 2013-06-02 DIAGNOSIS — R269 Unspecified abnormalities of gait and mobility: Secondary | ICD-10-CM

## 2013-06-02 DIAGNOSIS — R259 Unspecified abnormal involuntary movements: Secondary | ICD-10-CM

## 2013-06-02 DIAGNOSIS — R251 Tremor, unspecified: Secondary | ICD-10-CM

## 2013-06-02 HISTORY — DX: Unspecified abnormalities of gait and mobility: R26.9

## 2013-06-02 MED ORDER — ZOLPIDEM TARTRATE 10 MG PO TABS: 10.0000 mg | ORAL_TABLET | Freq: Every evening | ORAL | Status: DC | PRN

## 2013-06-02 NOTE — Progress Notes (Signed)
Patient ID: [name], female   DOB: 1933-02-18, 79 y.o.   MRN: [id number] [name] [phone])  Code Status: Living Will      Contact Information   Name Relation Home Work [place] Relative [phone]  [phone]   [name]   [phone]       Chief Complaint  Patient presents with  . Medical Managment of Chronic Issues    still has urgency, incontinence, since Friday, finished medication the following Monday    HPI: This  79 y.o. female resident of [facility] returns to clinic today due to persistent urinary urgency and incontinence despite treatment with a total of 7 days of Cipro. Patient denies fever, abdominal pain, nausea is unchanged. Appetite is unchanged.   Allergies  Allergen Reactions  . Mirtazapine     tremor  . Nickel Other (See Comments)    inflammation  . Other     Prednisone causes sleep disturbances   Medications    Medication List       This list is accurate as of: 06/02/13 12:18 PM.  Always use your most recent med list.               acetaminophen 325 MG tablet  Commonly known as:  TYLENOL  Take 650 mg by mouth 2 (two) times daily as needed. For pain     apixaban 5 MG Tabs tablet  Commonly known as:  ELIQUIS  Take 1 tablet (5 mg total) by mouth 2 (two) times daily.     cholestyramine 4 GM/DOSE powder  Commonly known as:  QUESTRAN  Take 4 grams 3 times daily to help naussea.     diltiazem 180 MG 24 hr capsule  Commonly known as:  CARDIZEM CD  Take 1 capsule (180 mg total) by mouth 2 (two) times daily.     flecainide 50 MG tablet  Commonly known as:  TAMBOCOR  Take 1 tablet (50 mg total) by mouth 2 (two) times daily.     losartan 100 MG tablet  Commonly known as:  COZAAR  Take 1/2 tablet daily to control BP     metoprolol succinate 50 MG 24 hr tablet  Commonly known as:  TOPROL-XL  Take 1/2 of 50 mg tablet to lower BP      multivitamin tablet  Take 1 tablet by mouth daily.     promethazine 25 MG tablet  Commonly known as:  PHENERGAN  Take 25 mg by mouth daily as needed. For nausea and vomiting     zolpidem 10 MG tablet  Commonly known as:  AMBIEN  Take 1 tablet (10 mg total) by mouth at bedtime as needed for sleep.        DATA REVIEWED  Radiologic Exams:   Cardiovascular Exams:   Laboratory Studies:    [facility]  HOSP LAB: 10/10/12: WBC 5.3,  hematocrit 39.8, hemoglobin 13.7, platelets 170    glucose 109, UA 11, creatinine 0.68, sodium 134, potassium 4.0.  GFR 82 magnesium 1.7    TSH 2.0 to  05/22/13 urine culture: COLONY COUNT:  >=100,000 COLONIES/ML   FINAL REPORT      ESCHERICHIA COLI   Review of Systems  DATA OBTAINED: from patient, GENERAL: Feels "OK"ell   No fever. - no improvement in appetite, still losing weight MOUTH/THROAT: No mouth or tooth pain  No sore throat No difficulty chewing or swallowing RESPIRATORY: No cough, wheezing, SOB CARDIAC: No chest pain,  palpitations  No edema. GI: No abdominal pain   No heartburn or reflux   Nausea unchanged, bowels fluctuate between diarrhea/ constipaiton GU:  Urinary urgency, incontinence, has no sensation of urinating NEUROLOGIC: No dizziness, fainting, headache,  No change in mental status.  PSYCHIATRIC: No feelings of anxiety, depression Sleeps well.    Physical Exam Filed Vitals:   06/02/13 1149  BP: 100/62  Pulse: 68  Temp: 95.6 F (35.3 C)  TempSrc: Oral  Height: 5' 7.5" (1.715 m)  Weight: 147 lb (66.679 kg)  SpO2: 99%   Body mass index is 22.67 kg/(m^2).  GENERAL APPEARANCE: No acute distress, appropriately groomed, normal body habitus. Alert, pleasant, conversant. EYES: Conjunctiva/lids clear.  EARS: Hearing grossly normal. NOSE: No deformity or discharge. RESPIRATORY: Breathing is even, unlabored. Lung sounds are clear and full.  CARDIOVASCULAR: Heart RRR. No murmur or extra heart sounds GASTROINTESTINAL:  Abdomen is soft, non-tender, not distended w/ normal bowel sounds. GENITOURINARY: Bladder non tender, not distended.  Vaginal atrophy noted, no cystocele. MUSCULOSKELETAL:  Pt requires assistance to stand,leans backwards. Gait is very unsteady, slow to start, better with walker. Gait is ataxic, flat-footed. Bilateral knee extension is limited; NEUROLOGIC: Oriented to time, place, person.  PSYCHIATRIC: Mood and affect appropriate to situation  ASSESSMENT/PLAN  UTI (urinary tract infection) Continued urinary symptoms despite treatment with Cipro. Repeat urine culture.  Abnormality of gait Worsening gait. Patient is very unsteady with tendency to fall backwards. Her gait is ataxic and flat-footed. Does have obviously atrophied muscles of her legs. Reviewed neurology note from August 2014. MRI was suggested in regards to patient's tremor and gait abnormality. Patient declined at that time but is now willing to proceed with this exam. Will arrange MRI of the brain with and without contrast. Patient will need updated BMP.  Tremor Bilateral hand tremor is markedly reduced. Only change for this patient has been reduction in amount of alcohol she drinks. Patient is now drinking just one drink per day, prepared by her caregiver.  Insomnia, unspecified This patient is taking Ambien to sleep for many years. Patient feels she is definitely addicted to this medication, is not interested in any attempt to reduce dosage or stop this medication. She is sleeping adequately with the current dose. Has requested renewal of prescription today  Other and unspecified alcohol dependence, continuous drinking behavior Patient has significantly reduced daily alcohol consumption. She is now drinking one diluted drink per day, this is prepared by her caregiver. The patient very much looks forward to this one drink a day, ports that it makes her tremor even better. Caregiver reports patient's tremor was very significant  several weeks ago, since reducing amount of alcohol tremor has reduced significantly.    Follow up: Keep scheduled appt w/ Dr. Annamary Carolin T.Laith Antonelli, NP-C 06/02/2013

## 2013-06-02 NOTE — Assessment & Plan Note (Signed)
Bilateral hand tremor is markedly reduced. Only change for this patient has been reduction in amount of alcohol she drinks. Patient is now drinking just one drink per day, prepared by her caregiver.

## 2013-06-02 NOTE — Assessment & Plan Note (Signed)
This patient is taking Ambien to sleep for many years. Patient feels she is definitely addicted to this medication, is not interested in any attempt to reduce dosage or stop this medication. She is sleeping adequately with the current dose. Has requested renewal of prescription today

## 2013-06-02 NOTE — Assessment & Plan Note (Signed)
Continued urinary symptoms despite treatment with Cipro. Repeat urine culture.

## 2013-06-02 NOTE — Assessment & Plan Note (Signed)
Worsening gait. Patient is very unsteady with tendency to fall backwards. Her gait is ataxic and flat-footed. Does have obviously atrophied muscles of her legs. Reviewed neurology note from August 2014. MRI was suggested in regards to patient's tremor and gait abnormality. Patient declined at that time but is now willing to proceed with this exam. Will arrange MRI of the brain with and without contrast. Patient will need updated BMP.

## 2013-06-02 NOTE — Assessment & Plan Note (Signed)
Patient has significantly reduced daily alcohol consumption. She is now drinking one diluted drink per day, this is prepared by her caregiver. The patient very much looks forward to this one drink a day, ports that it makes her tremor even better. Caregiver reports patient's tremor was very significant several weeks ago, since reducing amount of alcohol tremor has reduced significantly.

## 2013-06-04 DIAGNOSIS — I10 Essential (primary) hypertension: Secondary | ICD-10-CM | POA: Diagnosis not present

## 2013-06-04 DIAGNOSIS — N390 Urinary tract infection, site not specified: Secondary | ICD-10-CM | POA: Diagnosis not present

## 2013-06-05 ENCOUNTER — Emergency Department (HOSPITAL_COMMUNITY): Payer: Medicare Other

## 2013-06-05 ENCOUNTER — Inpatient Hospital Stay (HOSPITAL_COMMUNITY): Payer: Medicare Other

## 2013-06-05 ENCOUNTER — Encounter (HOSPITAL_COMMUNITY): Payer: Self-pay

## 2013-06-05 ENCOUNTER — Inpatient Hospital Stay (HOSPITAL_COMMUNITY)
Admission: EM | Admit: 2013-06-05 | Discharge: 2013-06-08 | DRG: 097 | Disposition: A | Discharge status: Skilled Nursing Facility | Payer: Medicare Other | Attending: Internal Medicine | Admitting: Internal Medicine

## 2013-06-05 DIAGNOSIS — Z8711 Personal history of peptic ulcer disease: Secondary | ICD-10-CM

## 2013-06-05 DIAGNOSIS — G9340 Encephalopathy, unspecified: Secondary | ICD-10-CM

## 2013-06-05 DIAGNOSIS — R7309 Other abnormal glucose: Secondary | ICD-10-CM | POA: Diagnosis not present

## 2013-06-05 DIAGNOSIS — R4182 Altered mental status, unspecified: Secondary | ICD-10-CM

## 2013-06-05 DIAGNOSIS — F29 Unspecified psychosis not due to a substance or known physiological condition: Secondary | ICD-10-CM | POA: Diagnosis not present

## 2013-06-05 DIAGNOSIS — R259 Unspecified abnormal involuntary movements: Secondary | ICD-10-CM

## 2013-06-05 DIAGNOSIS — Z7901 Long term (current) use of anticoagulants: Secondary | ICD-10-CM

## 2013-06-05 DIAGNOSIS — I480 Paroxysmal atrial fibrillation: Secondary | ICD-10-CM | POA: Diagnosis present

## 2013-06-05 DIAGNOSIS — I4891 Unspecified atrial fibrillation: Secondary | ICD-10-CM | POA: Diagnosis not present

## 2013-06-05 DIAGNOSIS — Z79899 Other long term (current) drug therapy: Secondary | ICD-10-CM

## 2013-06-05 DIAGNOSIS — R634 Abnormal weight loss: Secondary | ICD-10-CM | POA: Diagnosis present

## 2013-06-05 DIAGNOSIS — R509 Fever, unspecified: Secondary | ICD-10-CM

## 2013-06-05 DIAGNOSIS — Z9181 History of falling: Secondary | ICD-10-CM

## 2013-06-05 DIAGNOSIS — I10 Essential (primary) hypertension: Secondary | ICD-10-CM

## 2013-06-05 DIAGNOSIS — F329 Major depressive disorder, single episode, unspecified: Secondary | ICD-10-CM | POA: Diagnosis present

## 2013-06-05 DIAGNOSIS — G039 Meningitis, unspecified: Principal | ICD-10-CM | POA: Diagnosis present

## 2013-06-05 DIAGNOSIS — F0390 Unspecified dementia without behavioral disturbance: Secondary | ICD-10-CM | POA: Diagnosis present

## 2013-06-05 DIAGNOSIS — R269 Unspecified abnormalities of gait and mobility: Secondary | ICD-10-CM

## 2013-06-05 DIAGNOSIS — R251 Tremor, unspecified: Secondary | ICD-10-CM

## 2013-06-05 DIAGNOSIS — R6889 Other general symptoms and signs: Secondary | ICD-10-CM | POA: Diagnosis not present

## 2013-06-05 DIAGNOSIS — M1990 Unspecified osteoarthritis, unspecified site: Secondary | ICD-10-CM | POA: Diagnosis present

## 2013-06-05 DIAGNOSIS — F3289 Other specified depressive episodes: Secondary | ICD-10-CM | POA: Diagnosis present

## 2013-06-05 DIAGNOSIS — F1920 Other psychoactive substance dependence, uncomplicated: Secondary | ICD-10-CM | POA: Diagnosis not present

## 2013-06-05 DIAGNOSIS — Z66 Do not resuscitate: Secondary | ICD-10-CM | POA: Diagnosis present

## 2013-06-05 DIAGNOSIS — G4700 Insomnia, unspecified: Secondary | ICD-10-CM | POA: Diagnosis present

## 2013-06-05 DIAGNOSIS — T380X5A Adverse effect of glucocorticoids and synthetic analogues, initial encounter: Secondary | ICD-10-CM | POA: Diagnosis not present

## 2013-06-05 DIAGNOSIS — E876 Hypokalemia: Secondary | ICD-10-CM

## 2013-06-05 DIAGNOSIS — F32A Depression, unspecified: Secondary | ICD-10-CM | POA: Diagnosis present

## 2013-06-05 DIAGNOSIS — F1999 Other psychoactive substance use, unspecified with unspecified psychoactive substance-induced disorder: Secondary | ICD-10-CM | POA: Diagnosis not present

## 2013-06-05 DIAGNOSIS — F19939 Other psychoactive substance use, unspecified with withdrawal, unspecified: Secondary | ICD-10-CM | POA: Diagnosis not present

## 2013-06-05 DIAGNOSIS — G608 Other hereditary and idiopathic neuropathies: Secondary | ICD-10-CM | POA: Diagnosis present

## 2013-06-05 DIAGNOSIS — R739 Hyperglycemia, unspecified: Secondary | ICD-10-CM | POA: Diagnosis present

## 2013-06-05 DIAGNOSIS — F1020 Alcohol dependence, uncomplicated: Secondary | ICD-10-CM | POA: Diagnosis present

## 2013-06-05 LAB — COMPREHENSIVE METABOLIC PANEL
ALT: 13 U/L (ref 0–35)
Alkaline Phosphatase: 66 U/L (ref 39–117)
BUN: 10 mg/dL (ref 6–23)
CO2: 23 mEq/L (ref 19–32)
Chloride: 95 mEq/L — ABNORMAL LOW (ref 96–112)
GFR calc Af Amer: >90 mL/min (ref >90)
GFR calc non Af Amer: 85 mL/min — ABNORMAL LOW (ref >90)
Glucose, Bld: 203 mg/dL — ABNORMAL HIGH (ref 70–99)
Potassium: 3.8 mEq/L (ref 3.5–5.1)
Sodium: 132 mEq/L — ABNORMAL LOW (ref 135–145)
Total Bilirubin: 0.6 mg/dL (ref 0.3–1.2)

## 2013-06-05 LAB — URINALYSIS, ROUTINE W REFLEX MICROSCOPIC
Bilirubin Urine: NEGATIVE
Hgb urine dipstick: NEGATIVE
Ketones, ur: 40 mg/dL — AB
Nitrite: NEGATIVE
Protein, ur: NEGATIVE mg/dL
Specific Gravity, Urine: 1.016 (ref 1.005–1.030)
Urobilinogen, UA: 0.2 mg/dL (ref 0.0–1.0)

## 2013-06-05 LAB — CBC WITH DIFFERENTIAL/PLATELET
Eosinophils Absolute: 0 10*3/uL (ref 0.0–0.7)
Hemoglobin: 14 g/dL (ref 12.0–15.0)
Lymphocytes Relative: 11 % — ABNORMAL LOW (ref 12–46)
Lymphs Abs: 1.1 10*3/uL (ref 0.7–4.0)
MCH: 30.4 pg (ref 26.0–34.0)
Monocytes Relative: 6 % (ref 3–12)
Neutrophils Relative %: 83 % — ABNORMAL HIGH (ref 43–77)
Platelets: 391 10*3/uL (ref 150–400)
RBC: 4.6 MIL/uL (ref 3.87–5.11)
WBC: 10.2 10*3/uL (ref 4.0–10.5)

## 2013-06-05 LAB — GLUCOSE, CAPILLARY

## 2013-06-05 LAB — VITAMIN B12: Vitamin B-12: 443 pg/mL (ref 211–911)

## 2013-06-05 LAB — MAGNESIUM: Magnesium: 1.8 mg/dL (ref 1.5–2.5)

## 2013-06-05 LAB — PROTIME-INR: Prothrombin Time: 15.9 seconds — ABNORMAL HIGH (ref 11.6–15.2)

## 2013-06-05 LAB — RPR: RPR Ser Ql: NONREACTIVE

## 2013-06-05 LAB — TROPONIN I: Troponin I: <0.3 ng/mL (ref <0.30)

## 2013-06-05 MED ORDER — THIAMINE HCL 100 MG/ML IJ SOLN
100.0000 mg | Freq: Every day | INTRAMUSCULAR | Status: DC
  Administered 2013-06-06 – 2013-06-07 (×2): 100 mg via INTRAVENOUS
  Filled 2013-06-05 (×2): qty 1

## 2013-06-05 MED ORDER — ACETAMINOPHEN 325 MG PO TABS
650.0000 mg | ORAL_TABLET | Freq: Once | ORAL | Status: DC
  Filled 2013-06-05: qty 2

## 2013-06-05 MED ORDER — VANCOMYCIN HCL 500 MG IV SOLR
500.0000 mg | Freq: Two times a day (BID) | INTRAVENOUS | Status: DC
  Administered 2013-06-06 – 2013-06-08 (×5): 500 mg via INTRAVENOUS
  Filled 2013-06-05 (×6): qty 500

## 2013-06-05 MED ORDER — ONDANSETRON HCL 4 MG/2ML IJ SOLN: 4.0000 mg | Freq: Three times a day (TID) | INTRAMUSCULAR | Status: AC | PRN

## 2013-06-05 MED ORDER — DEXTROSE 5 % IV SOLN: 1.0000 g | Freq: Once | INTRAVENOUS | Status: DC

## 2013-06-05 MED ORDER — ACETAMINOPHEN 325 MG PO TABS
650.0000 mg | ORAL_TABLET | Freq: Four times a day (QID) | ORAL | Status: DC | PRN
  Administered 2013-06-06: 650 mg via ORAL
  Filled 2013-06-05: qty 2

## 2013-06-05 MED ORDER — DILTIAZEM HCL ER COATED BEADS 180 MG PO CP24
180.0000 mg | ORAL_CAPSULE | Freq: Every day | ORAL | Status: DC
  Administered 2013-06-06 – 2013-06-08 (×3): 180 mg via ORAL
  Filled 2013-06-05 (×4): qty 1

## 2013-06-05 MED ORDER — THIAMINE HCL 100 MG/ML IJ SOLN
Freq: Once | INTRAVENOUS | Status: AC
  Administered 2013-06-05: 15:00:00 via INTRAVENOUS
  Filled 2013-06-05: qty 1000

## 2013-06-05 MED ORDER — SODIUM CHLORIDE 0.9 % IV SOLN
2.0000 g | INTRAVENOUS | Status: DC
  Administered 2013-06-05 – 2013-06-07 (×13): 2 g via INTRAVENOUS
  Filled 2013-06-05 (×15): qty 2000

## 2013-06-05 MED ORDER — ACETAMINOPHEN 650 MG RE SUPP
650.0000 mg | Freq: Four times a day (QID) | RECTAL | Status: DC | PRN
  Administered 2013-06-05: 650 mg via RECTAL
  Filled 2013-06-05: qty 1

## 2013-06-05 MED ORDER — LORAZEPAM 2 MG/ML IJ SOLN
0.5000 mg | Freq: Once | INTRAMUSCULAR | Status: AC
  Administered 2013-06-05: 18:00:00 0.5 mg via INTRAVENOUS

## 2013-06-05 MED ORDER — DOCUSATE SODIUM 100 MG PO CAPS
100.0000 mg | ORAL_CAPSULE | Freq: Two times a day (BID) | ORAL | Status: DC
  Administered 2013-06-06 – 2013-06-08 (×6): 100 mg via ORAL
  Filled 2013-06-05 (×7): qty 1

## 2013-06-05 MED ORDER — APIXABAN 5 MG PO TABS
5.0000 mg | ORAL_TABLET | Freq: Two times a day (BID) | ORAL | Status: DC
  Administered 2013-06-06 – 2013-06-08 (×6): 5 mg via ORAL
  Filled 2013-06-05 (×8): qty 1

## 2013-06-05 MED ORDER — DEXTROSE 5 % IV SOLN: 500.0000 mg | Freq: Once | INTRAVENOUS | Status: DC

## 2013-06-05 MED ORDER — HYDROCODONE-ACETAMINOPHEN 5-325 MG PO TABS: 1.0000 | ORAL_TABLET | ORAL | Status: DC | PRN

## 2013-06-05 MED ORDER — SODIUM CHLORIDE 0.9 % IV SOLN: INTRAVENOUS | Status: AC

## 2013-06-05 MED ORDER — MAGNESIUM CITRATE PO SOLN: 1.0000 | Freq: Once | ORAL | Status: AC | PRN

## 2013-06-05 MED ORDER — ONDANSETRON HCL 4 MG/2ML IJ SOLN: 4.0000 mg | Freq: Four times a day (QID) | INTRAMUSCULAR | Status: DC | PRN

## 2013-06-05 MED ORDER — VANCOMYCIN HCL IN DEXTROSE 1-5 GM/200ML-% IV SOLN
1000.0000 mg | Freq: Once | INTRAVENOUS | Status: AC
  Administered 2013-06-05: 1000 mg via INTRAVENOUS
  Filled 2013-06-05: qty 200

## 2013-06-05 MED ORDER — ACETAMINOPHEN 325 MG PO TABS: 650.0000 mg | ORAL_TABLET | Freq: Once | ORAL | Status: AC

## 2013-06-05 MED ORDER — LOSARTAN POTASSIUM 50 MG PO TABS
100.0000 mg | ORAL_TABLET | Freq: Every day | ORAL | Status: DC
  Administered 2013-06-06 – 2013-06-08 (×3): 100 mg via ORAL
  Filled 2013-06-05 (×4): qty 2

## 2013-06-05 MED ORDER — SODIUM CHLORIDE 0.9 % IV BOLUS (SEPSIS)
500.0000 mL | Freq: Once | INTRAVENOUS | Status: AC
  Administered 2013-06-05: 500 mL via INTRAVENOUS

## 2013-06-05 MED ORDER — LOSARTAN POTASSIUM 50 MG PO TABS: 100.0000 mg | ORAL_TABLET | Freq: Every day | ORAL | Status: DC

## 2013-06-05 MED ORDER — SODIUM CHLORIDE 0.9 % IJ SOLN
3.0000 mL | Freq: Two times a day (BID) | INTRAMUSCULAR | Status: DC
  Administered 2013-06-05 – 2013-06-08 (×5): 3 mL via INTRAVENOUS

## 2013-06-05 MED ORDER — ONDANSETRON HCL 4 MG PO TABS: 4.0000 mg | ORAL_TABLET | Freq: Four times a day (QID) | ORAL | Status: DC | PRN

## 2013-06-05 MED ORDER — SORBITOL 70 % SOLN
30.0000 mL | Freq: Every day | Status: DC | PRN
  Administered 2013-06-06: 10:00:00 30 mL via ORAL
  Filled 2013-06-05: qty 30

## 2013-06-05 MED ORDER — DEXTROSE 5 % IV SOLN
2.0000 g | Freq: Once | INTRAVENOUS | Status: AC
  Administered 2013-06-05: 2 g via INTRAVENOUS
  Filled 2013-06-05: qty 2

## 2013-06-05 MED ORDER — LORAZEPAM 2 MG/ML IJ SOLN
INTRAMUSCULAR | Status: AC
  Administered 2013-06-05: 19:00:00
  Filled 2013-06-05: qty 1

## 2013-06-05 MED ORDER — PANTOPRAZOLE SODIUM 40 MG IV SOLR
40.0000 mg | INTRAVENOUS | Status: DC
  Administered 2013-06-05 – 2013-06-06 (×2): 40 mg via INTRAVENOUS
  Filled 2013-06-05 (×3): qty 40

## 2013-06-05 MED ORDER — ALUM & MAG HYDROXIDE-SIMETH 200-200-20 MG/5ML PO SUSP: 30.0000 mL | Freq: Four times a day (QID) | ORAL | Status: DC | PRN

## 2013-06-05 MED ORDER — SODIUM CHLORIDE 0.9 % IV BOLUS (SEPSIS)
1000.0000 mL | Freq: Once | INTRAVENOUS | Status: AC
  Administered 2013-06-05: 1000 mL via INTRAVENOUS

## 2013-06-05 MED ORDER — METOPROLOL SUCCINATE ER 50 MG PO TB24
50.0000 mg | ORAL_TABLET | Freq: Every day | ORAL | Status: DC
  Administered 2013-06-06 – 2013-06-08 (×3): 50 mg via ORAL
  Filled 2013-06-05 (×4): qty 1

## 2013-06-05 MED ORDER — DEXTROSE 5 % IV SOLN
2.0000 g | Freq: Two times a day (BID) | INTRAVENOUS | Status: DC
  Administered 2013-06-05 – 2013-06-08 (×6): 2 g via INTRAVENOUS
  Filled 2013-06-05 (×7): qty 2

## 2013-06-05 MED ORDER — OMEGA-3-ACID ETHYL ESTERS 1 G PO CAPS
1.0000 g | ORAL_CAPSULE | Freq: Every day | ORAL | Status: DC
  Administered 2013-06-06 – 2013-06-08 (×3): 1 g via ORAL
  Filled 2013-06-05 (×4): qty 1

## 2013-06-05 MED ORDER — ACETAMINOPHEN 650 MG RE SUPP
650.0000 mg | Freq: Once | RECTAL | Status: AC
  Administered 2013-06-05: 650 mg via RECTAL
  Filled 2013-06-05: qty 1

## 2013-06-05 MED ORDER — FOLIC ACID 5 MG/ML IJ SOLN
1.0000 mg | Freq: Every day | INTRAMUSCULAR | Status: DC
  Administered 2013-06-06 – 2013-06-08 (×3): 1 mg via INTRAVENOUS
  Filled 2013-06-05 (×5): qty 0.2

## 2013-06-05 MED ORDER — POLYETHYLENE GLYCOL 3350 17 G PO PACK
17.0000 g | PACK | Freq: Every day | ORAL | Status: DC | PRN
  Filled 2013-06-05: qty 1

## 2013-06-05 MED ORDER — ESCITALOPRAM OXALATE 10 MG PO TABS
10.0000 mg | ORAL_TABLET | Freq: Every day | ORAL | Status: DC
  Administered 2013-06-06 – 2013-06-07 (×3): 10 mg via ORAL
  Filled 2013-06-05 (×4): qty 1

## 2013-06-05 MED ORDER — DEXAMETHASONE SODIUM PHOSPHATE 10 MG/ML IJ SOLN
10.0000 mg | Freq: Four times a day (QID) | INTRAMUSCULAR | Status: DC
  Administered 2013-06-05 – 2013-06-07 (×7): 10 mg via INTRAVENOUS
  Filled 2013-06-05 (×12): qty 1

## 2013-06-05 MED ORDER — LORAZEPAM 2 MG/ML IJ SOLN
INTRAMUSCULAR | Status: AC
  Filled 2013-06-05: qty 1

## 2013-06-05 NOTE — Progress Notes (Signed)
Pt agitated easily, startles easy with loud noise, so order gotten from MD for ativan to tolerate laying still for MRI.  Pt is calmer since getting it. Transported to MRI at this time.

## 2013-06-05 NOTE — ED Notes (Signed)
I have just phoned report to [place], RN on [address]; and will transport with con't ecg monitoring.

## 2013-06-05 NOTE — ED Notes (Signed)
Staff at [facility] report pt. Felt "sick" yesterday evening; this morning she was found to be confused with fever.  She is in no distress; and repeats things like "help me".  She seeems unable to give answers about specifics.  Her speech is clear.

## 2013-06-05 NOTE — ED Notes (Signed)
She is now back from x-ray, and is in the presence of her step-daughter.  IV fluids given; coags drawn.  She is more calm and comfortable in appearance and remains in no distress.

## 2013-06-05 NOTE — H&P (Signed)
[facility] History and Physical  [name] NGE:[id number] DOB: [birth year] DOA: 06/05/2013  Referring physician: Dr Manus Gunning PCP: Kimber Relic, MD  Specialists:   Chief Complaint: Fever/AMS  HPI: [name] is a 79 y.o. female  With history of afib on anticoagulation, HTN, tremors since 09/2012, gait abnormality, falls, hx of ETOH dependence who live as [facility] facility who presents to ED with 1 day hx of fever, chills, worsening confusion from baseline, non productive cough. Patient is confused speech is incoherent and a such information was obtained from patient's daughter Eunice Blase. The patient's daughter the patient has not had any chest pain, no shortness of breath, no nausea, no vomiting, no abdominal pain, no diarrhea, no constipation no dysuria. Patient's daughter does endorse the patient is at some generalized weakness has been incontinent and was being treated for urinary tract infection and received about 8 day course of ciprofloxacin which was finished over the past 2-3 days. Patient is also had decreased oral intake. Patient's daughter also states the patient has had some tremors since general this year was seen by a neurologist in the past was a dose nothing else that can be done. Per patient's daughter patient has had some gait abnormalities as well at the PCP had ordered the MRI of the brain to be done next week Friday. Patient seen in the ED urinalysis which was done was nitrite negative leukocytes negative. Chest x-ray which was done was unremarkable. Blood cultures were drawn. Comprehensive metabolic profile of 10 and a sodium of 132 chloride of 95 glucose of 203 otherwise was within normal limits. EKG showed age of fibrillation. Lactic acid level is within normal limits at 1.89. Troponin was negative. CBC was within normal limits however had a left shift. Due to patient being on chronic anticoagulation and lumbar puncture could not be done. Patient was given a dose of  IV vancomycin and Rocephin to cover empirically for probable meningitis. We were called to admit the patient for further evaluation and management.  Review of Systems: The patient denies anorexia, fever, weight loss,, vision loss, decreased hearing, hoarseness, chest pain, syncope, dyspnea on exertion, peripheral edema, balance deficits, hemoptysis, abdominal pain, melena, hematochezia, severe indigestion/heartburn, hematuria, incontinence, genital sores, muscle weakness, suspicious skin lesions, transient blindness, difficulty walking, depression, unusual weight change, abnormal bleeding, enlarged lymph nodes, angioedema, and breast masses.   Past Medical History  Diagnosis Date  . A-fib   . Hypertension   . Arthritis   . Cataracts, bilateral   . Other acariasis     peripherial neuropathy  . History of stomach ulcers   . GI bleed 06/2002  . UTI (lower urinary tract infection)     pt state uti w/ no pain  . PVC (premature ventricular contraction)   . Migraines   . Abdominal pain, unspecified site   . Diverticulosis of colon (without mention of hemorrhage)   . Hemorrhage of gastrointestinal tract, unspecified   . Unspecified hereditary and idiopathic peripheral neuropathy   . Osteoarthrosis, unspecified whether generalized or localized, lower leg   . Palpitations   . Depression   . Other and unspecified alcohol dependence, continuous drinking behavior 10/28/2012  . Nausea alone 10/28/2012  . Obesity, unspecified 10/27/2012  . Diverticulosis of colon (without mention of hemorrhage) 10/27/2012  . Insomnia, unspecified 10/27/2012  . Edema 10/27/2012  . Localized osteoarthrosis not specified whether primary or secondary, lower leg 05/31/2011  . Abnormality of gait 06/02/2013   Past Surgical History  Procedure Laterality Date  .  Breast reduction surgery  12/23/2003  . Cholecystectomy  2005  . Cataracts    . Arthroscopic repair acl    . Tonsillectomy and adenoidectomy  1941  . Breast biopsy  1973     left   Social History:  reports that she quit smoking about 29 years ago. Her smoking use included Cigarettes. She smoked 0.00 packs per day. She has never used smokeless tobacco. She reports that  drinks alcohol. She reports that she does not use illicit drugs.  Allergies  Allergen Reactions  . Mirtazapine     tremor  . Nickel Other (See Comments)    inflammation  . Other     Prednisone causes sleep disturbances    History reviewed. No pertinent family history.   Prior to Admission medications   Medication Sig Start Date End Date Taking? Authorizing Provider  acetaminophen (TYLENOL) 325 MG tablet Take 650 mg by mouth 2 (two) times daily as needed. For pain   Yes Historical Provider, MD  apixaban (ELIQUIS) 5 MG TABS tablet Take 1 tablet (5 mg total) by mouth 2 (two) times daily. 02/02/13  Yes Peter M [place], MD  Cholecalciferol (VITAMIN D) 2000 UNITS tablet Take 2,000 Units by mouth daily.   Yes Historical Provider, MD  cholestyramine Lanetta Inch) 4 GM/DOSE powder Take 4 grams 3 times daily to help naussea. 02/01/13  Yes Kimber Relic, MD  diltiazem (CARDIZEM CD) 180 MG 24 hr capsule Take 180 mg by mouth daily.   Yes Historical Provider, MD  escitalopram (LEXAPRO) 10 MG tablet Take 10 mg by mouth at bedtime.   Yes Historical Provider, MD  losartan (COZAAR) 100 MG tablet Take 100 mg by mouth daily.   Yes Historical Provider, MD  metoprolol succinate (TOPROL-XL) 50 MG 24 hr tablet Take 50 mg by mouth daily. Take with or immediately following a meal.   Yes Historical Provider, MD  Omega-3 Fatty Acids (FISH OIL) 1200 MG CAPS Take 1,200 mg by mouth daily.   Yes Historical Provider, MD  promethazine (PHENERGAN) 25 MG tablet Take 25 mg by mouth daily as needed. For nausea and vomiting   Yes Historical Provider, MD  zolpidem (AMBIEN) 10 MG tablet Take 1 tablet (10 mg total) by mouth at bedtime as needed for sleep. 06/02/13  Yes Claudette Royston Sinner, NP   Physical Exam: Filed Vitals:   06/05/13  1208  BP: 136/64  Pulse: 97  Temp: 101.3 F (38.5 C)  Resp: 23     General:  Elderly female confused in no acute cardiopulmonary distress.  Eyes: Pupils equal round and reactive to light and accommodation. Extraocular movements intact.  ENT: Patient refuses to open her mouth. Dry mucous membranes.  Neck: Supple with no lymphadenopathy.  Cardiovascular: Irregularly irregular  Respiratory: Clear to auscultation bilaterally no wheezes no crackles no rhonchi.  Abdomen: Soft, nontender, nondistended, positive bowel sounds.  Skin: No rashes or lesions.  Musculoskeletal: Moving extremities spontaneously  Psychiatric: Confused. Unable to assess.  Neurologic: Confused. Not following commands. Unable to assess. Moving extremities spontaneously. No meningismus noted.  Labs on Admission:  Basic Metabolic Panel:  Recent Labs Lab 06/05/13 0915  NA 132*  K 3.8  CL 95*  CO2 23  GLUCOSE 203*  BUN 10  CREATININE 0.61  CALCIUM 9.5   Liver Function Tests:  Recent Labs Lab 06/05/13 0915  AST 17  ALT 13  ALKPHOS 66  BILITOT 0.6  PROT 7.1  ALBUMIN 4.1   No results found for this basename: LIPASE, AMYLASE,  in the last 168 hours No results found for this basename: AMMONIA,  in the last 168 hours CBC:  Recent Labs Lab 06/05/13 0915  WBC 10.2  NEUTROABS 8.5*  HGB 14.0  HCT 40.0  MCV 87.0  PLT 391   Cardiac Enzymes:  Recent Labs Lab 06/05/13 0915  TROPONINI <0.30    BNP (last 3 results) No results found for this basename: PROBNP,  in the last 8760 hours CBG:  Recent Labs Lab 06/05/13 1009  GLUCAP 192*    Radiological Exams on Admission: Dg Chest 2 View  06/05/2013   CLINICAL DATA:  79 year old female with fever. Atrial fibrillation.  EXAM: CHEST  2 VIEW  COMPARISON:  10/08/2012 and earlier.  FINDINGS: AP and lateral views. Stable lung volumes. Stable cardiomegaly and mediastinal contours. Visualized tracheal air column is within normal limits. No  pneumothorax or pulmonary edema. No pleural effusion or acute pulmonary opacity. Mild linear scarring in the lingula. Osteopenia. No acute osseous abnormality identified.  IMPRESSION: No acute cardiopulmonary abnormality.   Electronically Signed   By: Augusto Gamble M.D.   On: 06/05/2013 09:33   Ct Head Wo Contrast  06/05/2013   *RADIOLOGY REPORT*  Clinical Data: confused, feverish, altered mental status  CT HEAD WITHOUT CONTRAST  Technique:  Contiguous axial images were obtained from the base of the skull through the vertex without contrast.  Comparison: None.  Findings: Calvarium intact.  No significant sinus inflammatory change except for fluid/debris dependently right maxillary sinus. No evidence of vascular territory infarct, hemorrhage, or extra- axial fluid.  Mild low attenuation in the deep white matter.  Mild age related atrophy.  IMPRESSION: Age appropriate involutional change.  Inflammatory change right maxillary sinus age uncertain.   Original Report Authenticated By: Esperanza Heir, M.D.    EKG: Independently reviewed. Atrial fibrillation/atrial flutter  Assessment/Plan Principal Problem:   Encephalopathy acute Active Problems:   Hypertension   A-fib   Depression   Tremor   Abnormality of gait   Insomnia, unspecified   Other and unspecified alcohol dependence, continuous drinking behavior   Fever  #1 acute encephalopathy/fever Patient presented to the ED with an acute encephalopathy, fever, chills. Concern is for probable meningitis. Urinalysis is negative. Chest x-ray is negative. Blood cultures are pending ordered and are pending. CBC has a normal white count however does have a left shift. Due to patient's chronic anticoagulation and lumbar puncture could not be done. CT of the head was negative for any acute abnormalities. Will admit the patient to telemetry. Repeat chest x-ray in the morning after patient has been hydrated. Blood cultures are pending. Check RPR, B12, RBC folate,  TSH. Check MRI of the head. Will treat empirically for presumed meningitis with vancomycin, Rocephin, ampicillin. Also place on IV Decadron. Neurological consultation. Follow.  #2 atrial fibrillation Stable. Continue Cardizem and Lopressor for rate control. Continue eliquis anticoagulation.  #3 hypertension Continue Lopressor and Cardizem.  #4 tremor/gait abnormality Ongoing for a while. CT of the head is negative. Check RPR, B12 levels, RBC folate, TSH. Check MRI of the head. Follow. Consult with neurology.  #5 insomnia Will hold patient's Ambien secondary to her altered mental status.  #6 history of gastric ulcers PPI.  #7 prophylaxis Protonix for GI prophylaxis. On eliquis for DVT prophylaxis.  Code Status: DNR Family Communication: Updated daughter Eunice Blase at bedside Disposition Plan: Admit to telemetry  Time spent: 46 MINS  [facility] Pager [phone]  If 7PM-7AM, please contact night-coverage [email or web address] Password [facility] 06/05/2013, 12:24 PM

## 2013-06-05 NOTE — Progress Notes (Signed)
Attempted to get report, put on hold.

## 2013-06-05 NOTE — ED Notes (Signed)
Lab called, needs PT

## 2013-06-05 NOTE — ED Provider Notes (Signed)
CSN: [id number]     Arrival date & time 06/05/13  0846 History   First MD Initiated Contact with Patient 06/05/13 [phone]     Chief Complaint  Patient presents with  . Fever   (Consider location/radiation/quality/duration/timing/severity/associated sxs/prior Treatment) HPI Comments: Patient arrived from nursing home with chief complaint of fever. She is unable to give any history and complaining of being cold but not able answer questions and speaking nonsense. No evidence of trauma.  The history is provided by the patient, the EMS personnel and the nursing home. The history is limited by the condition of the patient.    Past Medical History  Diagnosis Date  . A-fib   . Hypertension   . Arthritis   . Cataracts, bilateral   . Other acariasis     peripherial neuropathy  . History of stomach ulcers   . GI bleed 06/2002  . UTI (lower urinary tract infection)     pt state uti w/ no pain  . PVC (premature ventricular contraction)   . Migraines   . Abdominal pain, unspecified site   . Diverticulosis of colon (without mention of hemorrhage)   . Hemorrhage of gastrointestinal tract, unspecified   . Unspecified hereditary and idiopathic peripheral neuropathy   . Osteoarthrosis, unspecified whether generalized or localized, lower leg   . Palpitations   . Depression   . Other and unspecified alcohol dependence, continuous drinking behavior 10/28/2012  . Nausea alone 10/28/2012  . Obesity, unspecified 10/27/2012  . Diverticulosis of colon (without mention of hemorrhage) 10/27/2012  . Insomnia, unspecified 10/27/2012  . Edema 10/27/2012  . Localized osteoarthrosis not specified whether primary or secondary, lower leg 05/31/2011  . Abnormality of gait 06/02/2013   Past Surgical History  Procedure Laterality Date  . Breast reduction surgery  12/23/2003  . Cholecystectomy  2005  . Cataracts    . Arthroscopic repair acl    . Tonsillectomy and adenoidectomy  1941  . Breast biopsy  1973    left    History reviewed. No pertinent family history. History  Substance Use Topics  . Smoking status: Former Smoker    Types: Cigarettes    Quit date: 10/09/1983  . Smokeless tobacco: Never Used  . Alcohol Use: 0.0 oz/week    1-2 Shots of liquor per week     Comment: daily  scotch    OB History   Grav Para Term Preterm Abortions TAB SAB Ect Mult Living                 Review of Systems  Unable to perform ROS: Mental status change    Allergies  Mirtazapine; Nickel; and Other  Home Medications   Current Outpatient Rx  Name  Route  Sig  Dispense  Refill  . acetaminophen (TYLENOL) 325 MG tablet   Oral   Take 650 mg by mouth 2 (two) times daily as needed. For pain         . apixaban (ELIQUIS) 5 MG TABS tablet   Oral   Take 1 tablet (5 mg total) by mouth 2 (two) times daily.   180 tablet   3   . Cholecalciferol (VITAMIN D) 2000 UNITS tablet   Oral   Take 2,000 Units by mouth daily.         . cholestyramine (QUESTRAN) 4 GM/DOSE powder      Take 4 grams 3 times daily to help naussea.   378 g   12   . diltiazem (CARDIZEM CD) 180 MG  24 hr capsule   Oral   Take 180 mg by mouth daily.         Marland Kitchen escitalopram (LEXAPRO) 10 MG tablet   Oral   Take 10 mg by mouth at bedtime.         Marland Kitchen losartan (COZAAR) 100 MG tablet   Oral   Take 100 mg by mouth daily.         . metoprolol succinate (TOPROL-XL) 50 MG 24 hr tablet   Oral   Take 50 mg by mouth daily. Take with or immediately following a meal.         . Omega-3 Fatty Acids (FISH OIL) 1200 MG CAPS   Oral   Take 1,200 mg by mouth daily.         . promethazine (PHENERGAN) 25 MG tablet   Oral   Take 25 mg by mouth daily as needed. For nausea and vomiting         . zolpidem (AMBIEN) 10 MG tablet   Oral   Take 1 tablet (10 mg total) by mouth at bedtime as needed for sleep.   30 tablet   5    BP 158/73  Pulse 91  Temp(Src) 101.6 F (38.7 C) (Rectal)  Resp 21  SpO2 96% Physical Exam   Constitutional: She appears well-developed and well-nourished. No distress.  Encephalopathic, does not answer questions  HENT:  Head: Normocephalic and atraumatic.  Mouth/Throat: Oropharynx is clear and moist. No oropharyngeal exudate.  Eyes: Conjunctivae and EOM are normal. Pupils are equal, round, and reactive to light.  Neck: Normal range of motion. Neck supple.  No meningismus  Cardiovascular: Normal rate, regular rhythm and normal heart sounds.   Pulmonary/Chest: Breath sounds normal. No respiratory distress.  Abdominal: Soft. There is no tenderness. There is no rebound and no guarding.  Musculoskeletal: Normal range of motion. She exhibits no edema and no tenderness.  Neurological: She is alert.  Does not follow commands, moving all extremities  Skin: Skin is warm.    ED Course  Procedures (including critical care time) Labs Review Labs Reviewed  CBC WITH DIFFERENTIAL - Abnormal; Notable for the following:    Neutrophils Relative % 83 (*)    Neutro Abs 8.5 (*)    Lymphocytes Relative 11 (*)    All other components within normal limits  COMPREHENSIVE METABOLIC PANEL - Abnormal; Notable for the following:    Sodium 132 (*)    Chloride 95 (*)    Glucose, Bld 203 (*)    GFR calc non Af Amer 85 (*)    All other components within normal limits  URINALYSIS, ROUTINE W REFLEX MICROSCOPIC - Abnormal; Notable for the following:    Glucose, UA 100 (*)    Ketones, ur 40 (*)    All other components within normal limits  PROTIME-INR - Abnormal; Notable for the following:    Prothrombin Time 15.9 (*)    All other components within normal limits  GLUCOSE, CAPILLARY - Abnormal; Notable for the following:    Glucose-Capillary 192 (*)    All other components within normal limits  CULTURE, BLOOD (ROUTINE X 2)  CULTURE, BLOOD (ROUTINE X 2)  URINE CULTURE  TROPONIN I  CG4 I-STAT (LACTIC ACID)   Imaging Review Dg Chest 2 View  06/05/2013   CLINICAL DATA:  79 year old female with  fever. Atrial fibrillation.  EXAM: CHEST  2 VIEW  COMPARISON:  10/08/2012 and earlier.  FINDINGS: AP and lateral views. Stable lung volumes. Stable cardiomegaly  and mediastinal contours. Visualized tracheal air column is within normal limits. No pneumothorax or pulmonary edema. No pleural effusion or acute pulmonary opacity. Mild linear scarring in the lingula. Osteopenia. No acute osseous abnormality identified.  IMPRESSION: No acute cardiopulmonary abnormality.   Electronically Signed   By: Augusto Gamble M.D.   On: 06/05/2013 09:33   Ct Head Wo Contrast  06/05/2013   *RADIOLOGY REPORT*  Clinical Data: confused, feverish, altered mental status  CT HEAD WITHOUT CONTRAST  Technique:  Contiguous axial images were obtained from the base of the skull through the vertex without contrast.  Comparison: None.  Findings: Calvarium intact.  No significant sinus inflammatory change except for fluid/debris dependently right maxillary sinus. No evidence of vascular territory infarct, hemorrhage, or extra- axial fluid.  Mild low attenuation in the deep white matter.  Mild age related atrophy.  IMPRESSION: Age appropriate involutional change.  Inflammatory change right maxillary sinus age uncertain.   Original Report Authenticated By: Esperanza Heir, M.D.    MDM   1. Altered mental status   2. Fever    From nursing home with altered mental status and fever.  Daughter confirms patient is significantly worse than baseline.   CT head negative for acute abnormality. Urinalysis appears negative for infection however patient has recently been treated x2 with Cipro for UTI. We'll sent culture. Chest x-ray shows no infiltrate.  With fever and altered mental status there is concern for possible meningitis. We'll treat empirically with vancomycin and Rocephin meningeal dose. Patient is on apixaban which precludes lumbar puncture at this time. Discussed the patient's stepdaughter.  Admission d/w Dr. Janee Morn.  Will continue  droplet precautions.    Date: 06/05/2013  Rate: 90  Rhythm: atrial fibrillation  QRS Axis: normal  Intervals: normal  ST/T Wave abnormalities: nonspecific ST/T changes  Conduction Disutrbances:none  Narrative Interpretation: significant artifact  Old EKG Reviewed: changes noted    Glynn Octave, MD 06/05/13 1347

## 2013-06-05 NOTE — Consult Note (Signed)
NEURO HOSPITALIST CONSULT NOTE    Reason for Consult: altered mental status, fever, progressive gait imbalance and tremors.  HPI:                                                                                                                                          [name] is an 79 y.o. female with a past medical history significant for HTN, atrial fibrillation on xarelto, GI bleed, arthritis, depression, cognitive impairment, admitted to the hospital with fever and altered mental status. She also has a history of chronic hands tremors and progressive gait impairment with recurrent falls. She has been recently treated for UTI, but her daughter decided to bring her to the hospital because in the past 24 hours she has become very confused, febrile, with incoherent speech. She had an unremarkable CT brain but couldn't be tap in the ED because she is on xarelto. On the other hand, her daughter tells me that [name] started having bilateral hand tremors several months ago and subsequently started having impaired gait, shuffling, and recurrent falls. She also reports some mild changes in short term memory, but tells me that her mother requires assistance to perform many of her activities of daily living. Some bladder dysfunction but no changes in personality, behavior, or mood. She was seen by a local neurologist but unfortunately those records are not available for further review today. At this moment, she is confused, restless, and agitated.   Past Medical History  Diagnosis Date  . A-fib   . Hypertension   . Arthritis   . Cataracts, bilateral   . Other acariasis     peripherial neuropathy  . History of stomach ulcers   . GI bleed 06/2002  . UTI (lower urinary tract infection)     pt state uti w/ no pain  . PVC (premature ventricular contraction)   . Migraines   . Abdominal pain, unspecified site   . Diverticulosis of colon (without mention of hemorrhage)    . Hemorrhage of gastrointestinal tract, unspecified   . Unspecified hereditary and idiopathic peripheral neuropathy   . Osteoarthrosis, unspecified whether generalized or localized, lower leg   . Palpitations   . Depression   . Other and unspecified alcohol dependence, continuous drinking behavior 10/28/2012  . Nausea alone 10/28/2012  . Obesity, unspecified 10/27/2012  . Diverticulosis of colon (without mention of hemorrhage) 10/27/2012  . Insomnia, unspecified 10/27/2012  . Edema 10/27/2012  . Localized osteoarthrosis not specified whether primary or secondary, lower leg 05/31/2011  . Abnormality of gait 06/02/2013    Past Surgical History  Procedure Laterality Date  . Breast reduction surgery  12/23/2003  . Cholecystectomy  2005  . Cataracts    . Arthroscopic repair acl    . Tonsillectomy and  adenoidectomy  1941  . Breast biopsy  1973    left    History reviewed. No pertinent family history.  Family History:   Social History:  reports that she quit smoking about 29 years ago. Her smoking use included Cigarettes. She smoked 0.00 packs per day. She has never used smokeless tobacco. She reports that  drinks alcohol. She reports that she does not use illicit drugs.  Allergies  Allergen Reactions  . Mirtazapine     tremor  . Nickel Other (See Comments)    inflammation  . Other     Prednisone causes sleep disturbances    MEDICATIONS:                                                                                                                     I have reviewed the patient's current medications.   ROS:  Unable to obtain due to mental status.                                                                                                                                     History obtained from chart review and daughter.    Physical exam: restless, gets agitated and doesn't want to be examined. Blood pressure 146/74, pulse 100, temperature 101.8 F (38.8 C), temperature source  Rectal, resp. rate 20, height 5' 7.5" (1.715 m), weight 64.6 kg (142 lb 6.7 oz), SpO2 98.00%. Head: normocephalic. Neck: supple, no bruits, no JVD. Cardiac: no murmurs. Lungs: clear. Abdomen: soft, no tender, no mass. Extremities: no edema.  Neurologic Examination:                                                                                                      Limited exam as she is restless and doesn't allow to be examined. Mental status:  Alert and awake, but restless and agitated. CN 2-12: pupils 2-3 mm bilaterally, reactive. No gaze preference. EOM full, no nystagmus. Face seems to be symmetric. Tongue is midline. Motor: moves all  limbs symmetrically. Sensory: no tested. Coordination and gait: unable to test. Can not reliably test meningeal signs. No results found for this basename: cbc, bmp, coags, chol, tri, ldl, hga1c    Results for orders placed during the hospital encounter of 06/05/13 (from the past 48 hour(s))  CBC WITH DIFFERENTIAL     Status: Abnormal   Collection Time    06/05/13  9:15 AM      Result Value Range   WBC 10.2  4.0 - 10.5 K/uL   RBC 4.60  3.87 - 5.11 MIL/uL   Hemoglobin 14.0  12.0 - 15.0 g/dL   HCT 40.9  81.1 - 91.4 %   MCV 87.0  78.0 - 100.0 fL   MCH 30.4  26.0 - 34.0 pg   MCHC 35.0  30.0 - 36.0 g/dL   RDW 78.2  95.6 - 21.3 %   Platelets 391  150 - 400 K/uL   Neutrophils Relative % 83 (*) 43 - 77 %   Neutro Abs 8.5 (*) 1.7 - 7.7 K/uL   Lymphocytes Relative 11 (*) 12 - 46 %   Lymphs Abs 1.1  0.7 - 4.0 K/uL   Monocytes Relative 6  3 - 12 %   Monocytes Absolute 0.6  0.1 - 1.0 K/uL   Eosinophils Relative 0  0 - 5 %   Eosinophils Absolute 0.0  0.0 - 0.7 K/uL   Basophils Relative 0  0 - 1 %   Basophils Absolute 0.0  0.0 - 0.1 K/uL  COMPREHENSIVE METABOLIC PANEL     Status: Abnormal   Collection Time    06/05/13  9:15 AM      Result Value Range   Sodium 132 (*) 135 - 145 mEq/L   Potassium 3.8  3.5 - 5.1 mEq/L   Chloride 95 (*) 96 - 112 mEq/L    CO2 23  19 - 32 mEq/L   Glucose, Bld 203 (*) 70 - 99 mg/dL   BUN 10  6 - 23 mg/dL   Creatinine, Ser 0.86  0.50 - 1.10 mg/dL   Calcium 9.5  8.4 - 57.8 mg/dL   Total Protein 7.1  6.0 - 8.3 g/dL   Albumin 4.1  3.5 - 5.2 g/dL   AST 17  0 - 37 U/L   ALT 13  0 - 35 U/L   Alkaline Phosphatase 66  39 - 117 U/L   Total Bilirubin 0.6  0.3 - 1.2 mg/dL   GFR calc non Af Amer 85 (*) >90 mL/min   GFR calc Af Amer >90  >90 mL/min   Comment: (NOTE)     The eGFR has been calculated using the CKD EPI equation.     This calculation has not been validated in all clinical situations.     eGFR's persistently <90 mL/min signify possible Chronic Kidney     Disease.  TROPONIN I     Status: None   Collection Time    06/05/13  9:15 AM      Result Value Range   Troponin I <0.30  <0.30 ng/mL   Comment:            Due to the release kinetics of cTnI,     a negative result within the first hours     of the onset of symptoms does not rule out     myocardial infarction with certainty.     If myocardial infarction is still suspected,     repeat the test at appropriate intervals.  MAGNESIUM     Status: None   Collection Time    06/05/13  9:15 AM      Result Value Range   Magnesium 1.8  1.5 - 2.5 mg/dL  CG4 I-STAT (LACTIC ACID)     Status: None   Collection Time    06/05/13  9:20 AM      Result Value Range   Lactic Acid, Venous 1.89  0.5 - 2.2 mmol/L  URINALYSIS, ROUTINE W REFLEX MICROSCOPIC     Status: Abnormal   Collection Time    06/05/13 10:00 AM      Result Value Range   Color, Urine YELLOW  YELLOW   APPearance CLEAR  CLEAR   Specific Gravity, Urine 1.016  1.005 - 1.030   pH 7.5  5.0 - 8.0   Glucose, UA 100 (*) NEGATIVE mg/dL   Hgb urine dipstick NEGATIVE  NEGATIVE   Bilirubin Urine NEGATIVE  NEGATIVE   Ketones, ur 40 (*) NEGATIVE mg/dL   Protein, ur NEGATIVE  NEGATIVE mg/dL   Urobilinogen, UA 0.2  0.0 - 1.0 mg/dL   Nitrite NEGATIVE  NEGATIVE   Leukocytes, UA NEGATIVE  NEGATIVE   Comment:  MICROSCOPIC NOT DONE ON URINES WITH NEGATIVE PROTEIN, BLOOD, LEUKOCYTES, NITRITE, OR GLUCOSE <1000 mg/dL.  PROTIME-INR     Status: Abnormal   Collection Time    06/05/13 10:00 AM      Result Value Range   Prothrombin Time 15.9 (*) 11.6 - 15.2 seconds   INR 1.30  0.00 - 1.49  GLUCOSE, CAPILLARY     Status: Abnormal   Collection Time    06/05/13 10:09 AM      Result Value Range   Glucose-Capillary 192 (*) 70 - 99 mg/dL   Comment 1 Notify RN    RPR     Status: None   Collection Time    06/05/13  1:30 PM      Result Value Range   RPR NON REACTIVE  NON REACTIVE   Comment: Performed at [facility]  MRSA PCR SCREENING     Status: None   Collection Time    06/05/13  3:21 PM      Result Value Range   MRSA by PCR NEGATIVE  NEGATIVE   Comment:            The GeneXpert MRSA Assay (FDA     approved for NASAL specimens     only), is one component of a     comprehensive MRSA colonization     surveillance program. It is not     intended to diagnose MRSA     infection nor to guide or     monitor treatment for     MRSA infections.    Dg Chest 2 View  06/05/2013   CLINICAL DATA:  80 year old female with fever. Atrial fibrillation.  EXAM: CHEST  2 VIEW  COMPARISON:  10/08/2012 and earlier.  FINDINGS: AP and lateral views. Stable lung volumes. Stable cardiomegaly and mediastinal contours. Visualized tracheal air column is within normal limits. No pneumothorax or pulmonary edema. No pleural effusion or acute pulmonary opacity. Mild linear scarring in the lingula. Osteopenia. No acute osseous abnormality identified.  IMPRESSION: No acute cardiopulmonary abnormality.   Electronically Signed   By: Augusto Gamble M.D.   On: 06/05/2013 09:33   Ct Head Wo Contrast  06/05/2013   *RADIOLOGY REPORT*  Clinical Data: confused, feverish, altered mental status  CT HEAD WITHOUT CONTRAST  Technique:  Contiguous axial images  were obtained from the base of the skull through the vertex without contrast.   Comparison: None.  Findings: Calvarium intact.  No significant sinus inflammatory change except for fluid/debris dependently right maxillary sinus. No evidence of vascular territory infarct, hemorrhage, or extra- axial fluid.  Mild low attenuation in the deep white matter.  Mild age related atrophy.  IMPRESSION: Age appropriate involutional change.  Inflammatory change right maxillary sinus age uncertain.   Original Report Authenticated By: Esperanza Heir, M.D.     Assessment/Plan: 79 y/o with febrile illness, altered mental status, and apparently non foal neuro-exam. CT brain unremarkable. Unable to perform LP as she is on xarelto.-Agree with aggressive antibiotic treatment, as can not ruled out CNS infection. Chronic bilateral hand tremors with progressive gait impairment, recurrent falls, and reported mild short term memory changes make me wonder about the possibility of a primary degenerative CNS disorder like dementia. Doubt a primary extrapyramidal disorder. Will wait for MRI brain. Will follow up.    Wyatt Portela, MD [facility] [phone]  06/05/2013, 6:54 PM

## 2013-06-05 NOTE — Progress Notes (Signed)
Pt still very confused, unable to follow simple commands like using a straw, seems to recognize her dghter at times and other times not.  Has been able to rest some since adm to floor, but startles easily if IV pump beeps or phone rings, etc

## 2013-06-05 NOTE — ED Notes (Signed)
Bed: XB28 Expected date: 06/05/13 Expected time: 8:44 AM Means of arrival: Ambulance Comments: fever

## 2013-06-05 NOTE — Progress Notes (Signed)
ANTIBIOTIC CONSULT NOTE - INITIAL  Pharmacy Consult for vancomycin and ampicillin Indication: suspected meningitis  Allergies  Allergen Reactions  . Mirtazapine     tremor  . Nickel Other (See Comments)    inflammation  . Other     Prednisone causes sleep disturbances    Patient Measurements: Height: 5' 7.32" (171 cm) Weight: 147 lb 0.8 oz (66.7 kg) IBW/kg (Calculated) : 62.34   Vital Signs: Temp: 101.3 F (38.5 C) (09/13 1208) Temp src: Rectal (09/13 1208) BP: 136/64 mmHg (09/13 1208) Pulse Rate: 97 (09/13 1208)    Labs:  Recent Labs  06/05/13 0915  WBC 10.2  HGB 14.0  PLT 391  CREATININE 0.61   Estimated Creatinine Clearance: 57 ml/min (by C-G formula based on Cr of 0.61).   Microbiology: No results found for this or any previous visit (from the past 720 hour(s)).   Assessment: 70 yoF admitted 9/13 from NH with AMS and fever, and meningitis suspected by MD who started high dose ceftriaxone and has consulted Rx to dose vancomycin and ampicillin. Pts daughter states that pt has been treated for UTI with 8 day course of Cipro ending 2-3 days ago. Pt on apixaban PTA for Hx of atrial fibrillation so LP will not be performed.  Tm 101.6  WBC currently WNL at 10.2  SCr at baseline at 0.61, CrCl ~55 mL/min  Blood and urine cultures have been drawn, however blood cultures drawn after the administration of ceftriaxone  High-dose ceftriaxone is appropriate for indication  Ampicillin has been added due to age>50 and increased risk of Listeria in this patient population  Patient received ceftriaxone 2g IV x 1 and vancomycin 1g x1 in the ED prior to transfer to the floor  CT head unrevealing   Goal of Therapy:  Vancomycin trough level 15-20 mcg/ml eradication of infection  Plan:  - continue ceftriaxone 2g IV q12h - start ampicillin 2g IV q4h - start vancomycin 500mg  IV q12h at midnight tonight - follow-up culture results, fever curve, and clinical course -  follow-up vancomycin trough at steady state if indicated - follow-up antibiotic de-escalation and length of therapy without CSF cultures  Thank you for the consult.  Tomi Bamberger, PharmD Clinical Pharmacist Pager: [phone] Pharmacy: [phone] 06/05/2013 12:56 PM

## 2013-06-06 ENCOUNTER — Inpatient Hospital Stay (HOSPITAL_COMMUNITY): Payer: Medicare Other

## 2013-06-06 DIAGNOSIS — E876 Hypokalemia: Secondary | ICD-10-CM | POA: Diagnosis not present

## 2013-06-06 DIAGNOSIS — G9340 Encephalopathy, unspecified: Secondary | ICD-10-CM | POA: Diagnosis not present

## 2013-06-06 DIAGNOSIS — R269 Unspecified abnormalities of gait and mobility: Secondary | ICD-10-CM | POA: Diagnosis not present

## 2013-06-06 DIAGNOSIS — R509 Fever, unspecified: Secondary | ICD-10-CM | POA: Diagnosis not present

## 2013-06-06 DIAGNOSIS — I4891 Unspecified atrial fibrillation: Secondary | ICD-10-CM | POA: Diagnosis not present

## 2013-06-06 LAB — CBC
HCT: 36.7 % (ref 36.0–46.0)
Hemoglobin: 13 g/dL (ref 12.0–15.0)
MCHC: 35.4 g/dL (ref 30.0–36.0)
MCV: 85.7 fL (ref 78.0–100.0)
RDW: 13.4 % (ref 11.5–15.5)
WBC: 5.2 10*3/uL (ref 4.0–10.5)

## 2013-06-06 LAB — BASIC METABOLIC PANEL
BUN: 11 mg/dL (ref 6–23)
CO2: 22 mEq/L (ref 19–32)
Chloride: 97 mEq/L (ref 96–112)
Creatinine, Ser: 0.55 mg/dL (ref 0.50–1.10)
GFR calc Af Amer: >90 mL/min (ref >90)
Potassium: 3.1 mEq/L — ABNORMAL LOW (ref 3.5–5.1)

## 2013-06-06 LAB — URINE CULTURE
Colony Count: NO GROWTH
Culture: NO GROWTH

## 2013-06-06 MED ORDER — POTASSIUM CHLORIDE CRYS ER 20 MEQ PO TBCR
40.0000 meq | EXTENDED_RELEASE_TABLET | ORAL | Status: AC
  Administered 2013-06-06 (×2): 40 meq via ORAL
  Filled 2013-06-06 (×2): qty 2

## 2013-06-06 MED ORDER — ENSURE COMPLETE PO LIQD
237.0000 mL | Freq: Two times a day (BID) | ORAL | Status: DC
  Administered 2013-06-06 – 2013-06-07 (×2): 237 mL via ORAL

## 2013-06-06 NOTE — Progress Notes (Signed)
I disimpacted pt along with soap suds enema, very large amount of stool gotten

## 2013-06-06 NOTE — Progress Notes (Signed)
INITIAL NUTRITION ASSESSMENT  DOCUMENTATION CODES Per approved criteria  -Not Applicable   INTERVENTION: Ensure Complete po BID, each supplement provides 350 kcal and 13 grams of protein. - Continue to encourage po intake  NUTRITION DIAGNOSIS: Inadequate oral intake related to chronic illness as evidenced by reported weight loss.   Goal: Pt to meet >/= 90% of their estimated nutrition needs   Monitor:  Weight, po intake, acceptance of nutritional supplements, labs  Reason for Assessment: Consult  79 y.o. female  Admitting Dx: Encephalopathy acute  ASSESSMENT: With history of afib on anticoagulation, HTN, tremors since 09/2012, gait abnormality, falls, hx of ETOH dependence who live as [facility] facility who presents to ED on 9/13 with 1 day hx of fever, chills, worsening confusion from baseline, non productive cough.  Pt reports that 4 years ago she weighed 195 lbs.  Pt reports that "her stomach is hungry but her mouth wouldn't eat." She says that foods don't taste right to her. Per RN, pt eats when she is encouraged.  Pt agreed to try Ensure Complete.   Height: Ht Readings from Last 1 Encounters:  06/05/13 5' 7.5" (1.715 m)    Weight: Wt Readings from Last 1 Encounters:  06/06/13 139 lb 8.8 oz (63.3 kg)    Ideal Body Weight: 62.8 kg  % Ideal Body Weight: 101%  Wt Readings from Last 10 Encounters:  06/06/13 139 lb 8.8 oz (63.3 kg)  06/02/13 147 lb (66.679 kg)  05/26/13 148 lb (67.132 kg)  05/17/13 149 lb (67.586 kg)  04/27/13 150 lb (68.04 kg)  03/29/13 157 lb (71.215 kg)  02/02/13 165 lb (74.844 kg)  02/01/13 167 lb (75.751 kg)  01/11/13 168 lb (76.204 kg)  12/23/12 174 lb (78.926 kg)    Usual Body Weight: 195 lbs 4 years ago  % Usual Body Weight: 71%  BMI:  Body mass index is 21.52 kg/(m^2).  Estimated Nutritional Needs: Kcal: 1600-1800 Protein: 75-85 g Fluid: 1.6-1.8 L  Skin: WNL  Diet Order: General  EDUCATION NEEDS: -No education needs  identified at this time   Intake/Output Summary (Last 24 hours) at 06/06/13 1353 Last data filed at 06/06/13 1020  Gross per 24 hour  Intake   2960 ml  Output     50 ml  Net   2910 ml    Last BM: 9/14   Labs:   Recent Labs Lab 06/05/13 0915 06/06/13 0518  NA 132* 131*  K 3.8 3.1*  CL 95* 97  CO2 23 22  BUN 10 11  CREATININE 0.61 0.55  CALCIUM 9.5 8.7  MG 1.8 1.9  GLUCOSE 203* 194*    CBG (last 3)   Recent Labs  06/05/13 1009  GLUCAP 192*    Scheduled Meds: . acetaminophen  650 mg Oral Once  . ampicillin (OMNIPEN) IV  2 g Intravenous Q4H  . apixaban  5 mg Oral BID  . cefTRIAXone (ROCEPHIN)  IV  2 g Intravenous Q12H  . dexamethasone  10 mg Intravenous Q6H  . diltiazem  180 mg Oral Daily  . docusate sodium  100 mg Oral BID  . escitalopram  10 mg Oral QHS  . folic acid  1 mg Intravenous Daily  . losartan  100 mg Oral Daily  . metoprolol succinate  50 mg Oral Daily  . omega-3 acid ethyl esters  1 g Oral Daily  . pantoprazole (PROTONIX) IV  40 mg Intravenous Q24H  . sodium chloride  3 mL Intravenous Q12H  . thiamine  100  mg Intravenous Daily  . vancomycin  500 mg Intravenous Q12H    Continuous Infusions:   Past Medical History  Diagnosis Date  . A-fib   . Hypertension   . Arthritis   . Cataracts, bilateral   . Other acariasis     peripherial neuropathy  . History of stomach ulcers   . GI bleed 06/2002  . UTI (lower urinary tract infection)     pt state uti w/ no pain  . PVC (premature ventricular contraction)   . Migraines   . Abdominal pain, unspecified site   . Diverticulosis of colon (without mention of hemorrhage)   . Hemorrhage of gastrointestinal tract, unspecified   . Unspecified hereditary and idiopathic peripheral neuropathy   . Osteoarthrosis, unspecified whether generalized or localized, lower leg   . Palpitations   . Depression   . Other and unspecified alcohol dependence, continuous drinking behavior 10/28/2012  . Nausea alone  10/28/2012  . Obesity, unspecified 10/27/2012  . Diverticulosis of colon (without mention of hemorrhage) 10/27/2012  . Insomnia, unspecified 10/27/2012  . Edema 10/27/2012  . Localized osteoarthrosis not specified whether primary or secondary, lower leg 05/31/2011  . Abnormality of gait 06/02/2013    Past Surgical History  Procedure Laterality Date  . Breast reduction surgery  12/23/2003  . Cholecystectomy  2005  . Cataracts    . Arthroscopic repair acl    . Tonsillectomy and adenoidectomy  1941  . Breast biopsy  1973    left    Ebbie Latus RD, LDN

## 2013-06-06 NOTE — Progress Notes (Signed)
Pt noted to have rectal bleeding w/bowel movement.  Pt is constipated, Sorbitol given, MD aware and order to give enema if no results w/med

## 2013-06-06 NOTE — Progress Notes (Signed)
[facility] PROGRESS NOTE  [name] NWG:[id number] DOB: [birth year] DOA: 06/05/2013 PCP: Kimber Relic, MD  Assessment/Plan: #1 acute encephalopathy Questionable etiology. Significant clinical improvement. Patient had presented with a febrile illness and being treated empirically for CNS infection. CT of the head was negative. MRI of the head with no acute abnormalities. Repeat chest x-ray is negative. Urinalysis negative. Blood cultures are pending. Continue empiric IV vancomycin, Rocephin, ampicillin, IV Decadron. Neurology is following and appreciate input and recommendations.  #2 hypokalemia Replete.  #3 atrial fibrillation Continue Lopressor and Cardizem for rate control. Eliquis for anticoagulation.   #4 hypertension  Cardizem and Lopressor.  #5 tremor/gait abnormality Questionable etiology. Questionable dementia. B12 level is within normal limits. TSH within normal limits. RBC folate within normal limits. RPR was nonreactive. MRI of the head with no acute abnormalities. Neurology following. PT/OT.  #6 insomnia Patient's Ambien was on hold secondary to altered mental status. Follow.  #7 history of gastric ulcers PPI.  #8 prophylaxis PPI for GI prophylaxis. On eliquis for DVT prophylaxis  Code Status: DNR Family Communication: updated patient. No family at bedside Disposition Plan: Facility when medically stable   Consultants:  Neurology: Dr Leroy Kennedy 06/05/13  Procedures:  MRI head 06/05/13  CT head 06/05/13  CXR 06/05/13, 06/05/13  Antibiotics:  IV Vancomycin 06/05/13  IV Rocephin 06/05/13  IV Ampicillin 06/05/13  HPI/Subjective: Patient alert ff commands. No complaints.  Objective: Filed Vitals:   06/06/13 0554  BP:   Pulse:   Temp: 98.9 F (37.2 C)  Resp:     Intake/Output Summary (Last 24 hours) at 06/06/13 1031 Last data filed at 06/06/13 0832  Gross per 24 hour  Intake   2620 ml  Output     50 ml  Net   2570 ml   Filed Weights    06/05/13 1208 06/05/13 1238 06/06/13 0608  Weight: 66.7 kg (147 lb 0.8 oz) 64.6 kg (142 lb 6.7 oz) 63.3 kg (139 lb 8.8 oz)    Exam:   General:  Alert. FF commands  Cardiovascular: RRR  Respiratory: CTAB  Abdomen: Soft/NT/ND/+BS  Musculoskeletal: 5/5 bue strength, 5/5 ble strength  Data Reviewed: Basic Metabolic Panel:  Recent Labs Lab 06/05/13 0915 06/06/13 0518  NA 132* 131*  K 3.8 3.1*  CL 95* 97  CO2 23 22  GLUCOSE 203* 194*  BUN 10 11  CREATININE 0.61 0.55  CALCIUM 9.5 8.7  MG 1.8 1.9   Liver Function Tests:  Recent Labs Lab 06/05/13 0915  AST 17  ALT 13  ALKPHOS 66  BILITOT 0.6  PROT 7.1  ALBUMIN 4.1   No results found for this basename: LIPASE, AMYLASE,  in the last 168 hours No results found for this basename: AMMONIA,  in the last 168 hours CBC:  Recent Labs Lab 06/05/13 0915 06/06/13 0518  WBC 10.2 5.2  NEUTROABS 8.5*  --   HGB 14.0 13.0  HCT 40.0 36.7  MCV 87.0 85.7  PLT 391 324   Cardiac Enzymes:  Recent Labs Lab 06/05/13 0915  TROPONINI <0.30   BNP (last 3 results) No results found for this basename: PROBNP,  in the last 8760 hours CBG:  Recent Labs Lab 06/05/13 1009  GLUCAP 192*    Recent Results (from the past 240 hour(s))  MRSA PCR SCREENING     Status: None   Collection Time    06/05/13  3:21 PM      Result Value Range Status   MRSA by PCR NEGATIVE  NEGATIVE  Final   Comment:            The GeneXpert MRSA Assay (FDA     approved for NASAL specimens     only), is one component of a     comprehensive MRSA colonization     surveillance program. It is not     intended to diagnose MRSA     infection nor to guide or     monitor treatment for     MRSA infections.     Studies: Dg Chest 2 View  06/05/2013   CLINICAL DATA:  79 year old female with fever. Atrial fibrillation.  EXAM: CHEST  2 VIEW  COMPARISON:  10/08/2012 and earlier.  FINDINGS: AP and lateral views. Stable lung volumes. Stable cardiomegaly and  mediastinal contours. Visualized tracheal air column is within normal limits. No pneumothorax or pulmonary edema. No pleural effusion or acute pulmonary opacity. Mild linear scarring in the lingula. Osteopenia. No acute osseous abnormality identified.  IMPRESSION: No acute cardiopulmonary abnormality.   Electronically Signed   By: Augusto Gamble M.D.   On: 06/05/2013 09:33   Ct Head Wo Contrast  06/05/2013   *RADIOLOGY REPORT*  Clinical Data: confused, feverish, altered mental status  CT HEAD WITHOUT CONTRAST  Technique:  Contiguous axial images were obtained from the base of the skull through the vertex without contrast.  Comparison: None.  Findings: Calvarium intact.  No significant sinus inflammatory change except for fluid/debris dependently right maxillary sinus. No evidence of vascular territory infarct, hemorrhage, or extra- axial fluid.  Mild low attenuation in the deep white matter.  Mild age related atrophy.  IMPRESSION: Age appropriate involutional change.  Inflammatory change right maxillary sinus age uncertain.   Original Report Authenticated By: Esperanza Heir, M.D.   Mr Brain Wo Contrast  06/05/2013   CLINICAL DATA:  Altered mental status with fever and ataxia.  EXAM: MRI HEAD WITHOUT CONTRAST  TECHNIQUE: Multiplanar, multisequence MR imaging was performed. No intravenous contrast was administered.  COMPARISON:  Head CT from same day.  FINDINGS: Calvarium and upper cervical spine: No marrow signal abnormality. Hyperostosis of the frontal calvarium.  Orbits: Bilateral cataract resection.  Sinuses: Atelectatic right maxillary sinus with mild mucosal thickening Mastoid and middle ears are clear.  Brain: No acute abnormality such as acute infarct, hemorrhage, hydrocephalus, or mass lesion. No evidence of large vessel occlusion. Patchy bilateral cerebral white matter FLAIR hyperintensity consistent with chronic small vessel ischemia. Remote lacunar infarct in the right caudate body.  IMPRESSION: 1. No  evidence of acute intracranial disease. 2. Chronic small vessel ischemic injury.   Electronically Signed   By: Tiburcio Pea   On: 06/05/2013 22:17   Dg Chest Port 1 View  06/06/2013   *RADIOLOGY REPORT*  Clinical Data: Fever  PORTABLE CHEST - 1 VIEW  Comparison: 06/05/2013  Findings: Stable cardiac silhouette.  Vascular pattern normal. Lungs clear except for mild scarring or discoid atelectasis left lung base.  IMPRESSION: No acute findings   Original Report Authenticated By: Esperanza Heir, M.D.    Scheduled Meds: . sodium chloride   Intravenous STAT  . acetaminophen  650 mg Oral Once  . ampicillin (OMNIPEN) IV  2 g Intravenous Q4H  . apixaban  5 mg Oral BID  . cefTRIAXone (ROCEPHIN)  IV  2 g Intravenous Q12H  . dexamethasone  10 mg Intravenous Q6H  . diltiazem  180 mg Oral Daily  . docusate sodium  100 mg Oral BID  . escitalopram  10 mg Oral QHS  .  folic acid  1 mg Intravenous Daily  . losartan  100 mg Oral Daily  . metoprolol succinate  50 mg Oral Daily  . omega-3 acid ethyl esters  1 g Oral Daily  . pantoprazole (PROTONIX) IV  40 mg Intravenous Q24H  . potassium chloride  40 mEq Oral Q4H  . sodium chloride  3 mL Intravenous Q12H  . thiamine  100 mg Intravenous Daily  . vancomycin  500 mg Intravenous Q12H   Continuous Infusions:   Principal Problem:   Encephalopathy acute Active Problems:   Hypertension   A-fib   Depression   Tremor   Abnormality of gait   Insomnia, unspecified   Other and unspecified alcohol dependence, continuous drinking behavior   Fever   Hypokalemia    Time spent: > 30 mins    [facility]  [facility] Pager [phone]. If 7PM-7AM, please contact night-coverage at [email or web address], password [facility] 06/06/2013, 10:31 AM  LOS: 1 day

## 2013-06-06 NOTE — Evaluation (Signed)
Physical Therapy Evaluation Patient Details Name: [name] MRN: [id number] DOB: 1933/08/22 Today's Date: 06/06/2013 Time: 7829-5621 PT Time Calculation (min): 16 min  PT Assessment / Plan / Recommendation History of Present Illness   acute encephalopathy; Questionable etiology. Significant clinical improvement. Patient had presented with a febrile illness and being treated empirically for CNS infection. CT of the head was negative. MRI of the head with no acute abnormalities. Repeat chest x-ray is negative. Urinalysis negative. Blood cultures are pending. Continue empiric IV vancomycin, Rocephin, ampicillin, IV Decadron. Neurology is following and appreciate input and recommendations  Clinical Impression  Pt will benefit from PT to maximize independence for next venue of care; recommend SNF post acute; Will follow    PT Assessment  Patient needs continued PT services    Follow Up Recommendations  SNF    Does the patient have the potential to tolerate intense rehabilitation      Barriers to Discharge        Equipment Recommendations  None recommended by PT    Recommendations for Other Services     Frequency Min 3X/week    Precautions / Restrictions Precautions Precautions: Fall   Pertinent Vitals/Pain No c/o pain      Mobility  Bed Mobility Bed Mobility: Supine to Sit Supine to Sit: 4: Min guard Details for Bed Mobility Assistance: for safety, IV line management Transfers Transfers: Sit to Stand;Stand to Sit Sit to Stand: 4: Min assist;From bed Stand to Sit: 4: Min assist;To bed Details for Transfer Assistance: cues for safety and attention to task Ambulation/Gait Ambulation/Gait Assistance: 4: Min assist Ambulation Distance (Feet): 80 Feet Assistive device: Rolling walker Ambulation/Gait Assistance Details: multi-modal cues for attention to task, RW safety Gait Pattern: Step-through pattern;Decreased stride length;Scissoring;Right foot flat;Left foot  flat Gait velocity: decr General Gait Details: unsteady    Exercises     PT Diagnosis: Difficulty walking  PT Problem List: Decreased strength;Decreased range of motion;Decreased activity tolerance;Decreased balance;Decreased mobility;Decreased knowledge of use of DME;Decreased safety awareness;Decreased knowledge of precautions PT Treatment Interventions: DME instruction;Gait training;Stair training;Functional mobility training;Therapeutic activities;Therapeutic exercise;Patient/family education;Balance training     PT Goals(Current goals can be found in the care plan section)    Visit Information  Last PT Received On: 06/06/13 Assistance Needed: +1 History of Present Illness:  acute encephalopathy; Questionable etiology. Significant clinical improvement. Patient had presented with a febrile illness and being treated empirically for CNS infection. CT of the head was negative. MRI of the head with no acute abnormalities. Repeat chest x-ray is negative. Urinalysis negative. Blood cultures are pending. Continue empiric IV vancomycin, Rocephin, ampicillin, IV Decadron. Neurology is following and appreciate input and recommendations       Prior Functioning  Home Living Family/patient expects to be discharged to:: Skilled nursing facility Additional Comments: pt was in I living prior to adm Prior Function Level of Independence: Independent with assistive device(s) Comments: meals delivered Communication Communication: No difficulties    Cognition  Cognition Arousal/Alertness: Awake/alert Behavior During Therapy: Restless Overall Cognitive Status: Impaired/Different from baseline Area of Impairment: Attention;Memory;Following commands;Safety/judgement;Awareness;Problem solving Current Attention Level: Focused Memory: Decreased recall of precautions;Decreased short-term memory Following Commands: Follows one step commands inconsistently Problem Solving: Slow processing;Decreased  initiation;Difficulty sequencing;Requires verbal cues;Requires tactile cues    Extremity/Trunk Assessment Upper Extremity Assessment Upper Extremity Assessment: Defer to OT evaluation Lower Extremity Assessment Lower Extremity Assessment: Generalized weakness   Balance Static Standing Balance Static Standing - Balance Support: Bilateral upper extremity supported Static Standing - Level of Assistance: 4:  Min assist  End of Session PT - End of Session Equipment Utilized During Treatment: Gait belt Activity Tolerance: Patient limited by fatigue Patient left: in chair;with call bell/phone within reach Nurse Communication: Mobility status  GP     [facility] 06/06/2013, 5:36 PM

## 2013-06-07 DIAGNOSIS — R739 Hyperglycemia, unspecified: Secondary | ICD-10-CM | POA: Diagnosis present

## 2013-06-07 DIAGNOSIS — I4891 Unspecified atrial fibrillation: Secondary | ICD-10-CM | POA: Diagnosis not present

## 2013-06-07 DIAGNOSIS — R509 Fever, unspecified: Secondary | ICD-10-CM | POA: Diagnosis not present

## 2013-06-07 DIAGNOSIS — M1990 Unspecified osteoarthritis, unspecified site: Secondary | ICD-10-CM | POA: Diagnosis present

## 2013-06-07 DIAGNOSIS — I10 Essential (primary) hypertension: Secondary | ICD-10-CM

## 2013-06-07 DIAGNOSIS — E876 Hypokalemia: Secondary | ICD-10-CM | POA: Diagnosis not present

## 2013-06-07 DIAGNOSIS — G9340 Encephalopathy, unspecified: Secondary | ICD-10-CM | POA: Diagnosis not present

## 2013-06-07 DIAGNOSIS — R4182 Altered mental status, unspecified: Secondary | ICD-10-CM | POA: Diagnosis not present

## 2013-06-07 LAB — BASIC METABOLIC PANEL
BUN: 19 mg/dL (ref 6–23)
CO2: 21 mEq/L (ref 19–32)
GFR calc non Af Amer: 84 mL/min — ABNORMAL LOW (ref >90)
Glucose, Bld: 187 mg/dL — ABNORMAL HIGH (ref 70–99)
Potassium: 4.2 mEq/L (ref 3.5–5.1)
Sodium: 132 mEq/L — ABNORMAL LOW (ref 135–145)

## 2013-06-07 LAB — CBC
Hemoglobin: 13 g/dL (ref 12.0–15.0)
MCH: 30.5 pg (ref 26.0–34.0)
MCHC: 35.1 g/dL (ref 30.0–36.0)
MCV: 86.9 fL (ref 78.0–100.0)
RBC: 4.26 MIL/uL (ref 3.87–5.11)

## 2013-06-07 LAB — FOLATE RBC: RBC Folate: 680 ng/mL — ABNORMAL HIGH (ref >=366)

## 2013-06-07 MED ORDER — ZOLPIDEM TARTRATE 5 MG PO TABS
5.0000 mg | ORAL_TABLET | Freq: Every evening | ORAL | Status: DC | PRN
  Administered 2013-06-07: 22:00:00 5 mg via ORAL
  Filled 2013-06-07: qty 1

## 2013-06-07 MED ORDER — LORAZEPAM 2 MG/ML IJ SOLN
1.0000 mg | Freq: Once | INTRAMUSCULAR | Status: AC
  Administered 2013-06-07: 1 mg via INTRAVENOUS
  Filled 2013-06-07: qty 1

## 2013-06-07 MED ORDER — VITAMIN B-1 100 MG PO TABS
100.0000 mg | ORAL_TABLET | Freq: Every day | ORAL | Status: DC
  Administered 2013-06-08: 10:00:00 100 mg via ORAL
  Filled 2013-06-07: qty 1

## 2013-06-07 MED ORDER — ZOLPIDEM TARTRATE 10 MG PO TABS: 10.0000 mg | ORAL_TABLET | Freq: Every evening | ORAL | Status: DC | PRN

## 2013-06-07 MED ORDER — LORAZEPAM 2 MG/ML IJ SOLN: 0.5000 mg | Freq: Three times a day (TID) | INTRAMUSCULAR | Status: DC | PRN

## 2013-06-07 MED ORDER — PANTOPRAZOLE SODIUM 40 MG PO TBEC
40.0000 mg | DELAYED_RELEASE_TABLET | Freq: Every day | ORAL | Status: DC
  Administered 2013-06-08: 14:00:00 40 mg via ORAL
  Filled 2013-06-07 (×2): qty 1

## 2013-06-07 NOTE — Progress Notes (Signed)
NEURO HOSPITALIST PROGRESS NOTE   SUBJECTIVE:                                                                                                                        No complaints today.  She is alert to place, date and month.  She will easily get side tracked and conversation is slightly tangential.   OBJECTIVE:                                                                                                                           Vital signs in last 24 hours: Temp:  [97.5 F (36.4 C)-98.7 F (37.1 C)] 98.7 F (37.1 C) (09/15 0518) Pulse Rate:  [63-81] 80 (09/15 0518) Resp:  [18] 18 (09/15 0518) BP: (129-142)/(57-78) 142/78 mmHg (09/15 0518) SpO2:  [95 %-99 %] 98 % (09/15 0518)  Intake/Output from previous day: 09/14 0701 - 09/15 0700 In: 2710 [P.O.:2160; IV Piggyback:550] Out: 400 [Urine:400] Intake/Output this shift: Total I/O In: 120 [P.O.:120] Out: -  Nutritional status: General  Past Medical History  Diagnosis Date  . A-fib   . Hypertension   . Arthritis   . Cataracts, bilateral   . Other acariasis     peripherial neuropathy  . History of stomach ulcers   . GI bleed 06/2002  . UTI (lower urinary tract infection)     pt state uti w/ no pain  . PVC (premature ventricular contraction)   . Migraines   . Abdominal pain, unspecified site   . Diverticulosis of colon (without mention of hemorrhage)   . Hemorrhage of gastrointestinal tract, unspecified   . Unspecified hereditary and idiopathic peripheral neuropathy   . Osteoarthrosis, unspecified whether generalized or localized, lower leg   . Palpitations   . Depression   . Other and unspecified alcohol dependence, continuous drinking behavior 10/28/2012  . Nausea alone 10/28/2012  . Obesity, unspecified 10/27/2012  . Diverticulosis of colon (without mention of hemorrhage) 10/27/2012  . Insomnia, unspecified 10/27/2012  . Edema 10/27/2012  . Localized osteoarthrosis not specified whether  primary or secondary, lower leg 05/31/2011  . Abnormality of gait 06/02/2013     Neurologic Exam:   Mental Status: Alert, oriented, thought content is tangential at times. Marland Kitchen  Speech fluent without evidence of aphasia.  Able to follow 3 step commands without difficulty. Cranial Nerves: II:  Visual fields grossly normal, pupils equal, round, reactive to light and accommodation III,IV, VI: ptosis not present, extra-ocular motions intact bilaterally V,VII: smile symmetric, facial light touch sensation normal bilaterally VIII: hearing normal bilaterally IX,X: gag reflex present XI: bilateral shoulder shrug XII: midline tongue extension Motor: Right : Upper extremity   5/5    Left:     Upper extremity   5/5  Lower extremity   5/5     Lower extremity   5/5 --fine postural and resting tremor Tone and bulk:normal tone throughout; no atrophy noted Sensory: Pinprick and light touch intact throughout, bilaterally Deep Tendon Reflexes:  Depressed 1+ throughout with no AJ Plantars: Right: downgoing   Left: downgoing   Lab Results: No results found for this basename: cbc, bmp, coags, chol, tri, ldl, hga1c   Lipid Panel No results found for this basename: CHOL, TRIG, HDL, CHOLHDL, VLDL, LDLCALC,  in the last 72 hours  Studies/Results: Mr Brain Wo Contrast  06/05/2013   CLINICAL DATA:  Altered mental status with fever and ataxia.  EXAM: MRI HEAD WITHOUT CONTRAST  TECHNIQUE: Multiplanar, multisequence MR imaging was performed. No intravenous contrast was administered.  COMPARISON:  Head CT from same day.  FINDINGS: Calvarium and upper cervical spine: No marrow signal abnormality. Hyperostosis of the frontal calvarium.  Orbits: Bilateral cataract resection.  Sinuses: Atelectatic right maxillary sinus with mild mucosal thickening Mastoid and middle ears are clear.  Brain: No acute abnormality such as acute infarct, hemorrhage, hydrocephalus, or mass lesion. No evidence of large vessel occlusion. Patchy  bilateral cerebral white matter FLAIR hyperintensity consistent with chronic small vessel ischemia. Remote lacunar infarct in the right caudate body.  IMPRESSION: 1. No evidence of acute intracranial disease. 2. Chronic small vessel ischemic injury.   Electronically Signed   By: Tiburcio Pea   On: 06/05/2013 22:17   Dg Chest Port 1 View  06/06/2013   *RADIOLOGY REPORT*  Clinical Data: Fever  PORTABLE CHEST - 1 VIEW  Comparison: 06/05/2013  Findings: Stable cardiac silhouette.  Vascular pattern normal. Lungs clear except for mild scarring or discoid atelectasis left lung base.  IMPRESSION: No acute findings   Original Report Authenticated By: Esperanza Heir, M.D.    MEDICATIONS                                                                                                                        Scheduled: . acetaminophen  650 mg Oral Once  . ampicillin (OMNIPEN) IV  2 g Intravenous Q4H  . apixaban  5 mg Oral BID  . cefTRIAXone (ROCEPHIN)  IV  2 g Intravenous Q12H  . diltiazem  180 mg Oral Daily  . docusate sodium  100 mg Oral BID  . escitalopram  10 mg Oral QHS  . feeding supplement  237 mL Oral BID BM  . folic acid  1 mg Intravenous Daily  .  losartan  100 mg Oral Daily  . metoprolol succinate  50 mg Oral Daily  . omega-3 acid ethyl esters  1 g Oral Daily  . pantoprazole (PROTONIX) IV  40 mg Intravenous Q24H  . sodium chloride  3 mL Intravenous Q12H  . thiamine  100 mg Intravenous Daily  . vancomycin  500 mg Intravenous Q12H    ASSESSMENT/PLAN:                                                                                                            79 y/o with febrile illness, altered mental status, and apparently non foal neuro-exam. CT brain unremarkable.  Unable to perform LP as she is on xarelto. MRI is negative. Continue to recommend aggressive antibiotic treatment, as can not ruled out CNS infection--this may be administered at nursing home if capable. Bilateral tremor likely  secondary to primary degenerative CNS disorder (dementia) and worsened in setting of infection. Last night patient was very confused and likely due to steroids.  I have spoke with Dr. Leroy Kennedy, and it would be fine to D/C the steroids at this time.   Recommend: 1) Continue ABX 2) Follow up out patient with her primary neurologist at time of discharge. No further recommendations. Neurology S/O   Discussed with both Dr. Leroy Kennedy and Dr. Janee Morn.     Felicie Morn PA-C [facility] [phone]  06/07/2013, 10:55 AM

## 2013-06-07 NOTE — Progress Notes (Signed)
[facility] PROGRESS NOTE  [name]:[id number] DOB: March 22, 1933 DOA: 06/05/2013 PCP: Kimber Relic, MD  Assessment/Plan: #1 acute encephalopathy/ Prob CNS infection Questionable etiology. Significant clinical improvement. Patient with bout of confusion last night likely secondary to steriods vs sundowning. Improved. Patient had presented with a febrile illness and being treated empirically for CNS infection. CT of the head was negative. MRI of the head with no acute abnormalities. Repeat chest x-ray is negative. Urinalysis negative. Blood cultures are pending. Continue empiric IV vancomycin, Rocephin, ampicillin. D/C IV Decadron. Neurology is following and appreciate input and recommendations. May need to treat empirically for 1 week of IV antibiotics for prob CNS infection as patient improved clinically and no other source of infection. Will consult with ID for further evaluation and rxcs.  #2 hypokalemia Repleted.  #3 atrial fibrillation Continue Lopressor and Cardizem for rate control. Eliquis for anticoagulation.   #4 hypertension  Cardizem and Lopressor.  #5 tremor/gait abnormality Questionable etiology. Likely secondary to dementia. B12 level is within normal limits. TSH within normal limits. RBC folate within normal limits. RPR was nonreactive. MRI of the head with no acute abnormalities. Neurology following. PT/OT.  #6 insomnia Patient's Ambien was on hold secondary to altered mental status. Follow.  #7 history of gastric ulcers PPI.  #8 prophylaxis PPI for GI prophylaxis. On eliquis for DVT prophylaxis  Code Status: DNR Family Communication: updated patient. No family at bedside Disposition Plan: Facility when medically stable   Consultants:  Neurology: Dr Leroy Kennedy 06/05/13  ID pending  Procedures:  MRI head 06/05/13  CT head 06/05/13  CXR 06/05/13, 06/05/13  Antibiotics:  IV Vancomycin 06/05/13  IV Rocephin 06/05/13  IV Ampicillin  06/05/13  HPI/Subjective: Patient alert ff commands. No complaints. Events of confusion noted overnight.  Objective: Filed Vitals:   06/07/13 0518  BP: 142/78  Pulse: 80  Temp: 98.7 F (37.1 C)  Resp: 18    Intake/Output Summary (Last 24 hours) at 06/07/13 1255 Last data filed at 06/07/13 1238  Gross per 24 hour  Intake   2030 ml  Output    400 ml  Net   1630 ml   Filed Weights   06/05/13 1208 06/05/13 1238 06/06/13 0608  Weight: 66.7 kg (147 lb 0.8 oz) 64.6 kg (142 lb 6.7 oz) 63.3 kg (139 lb 8.8 oz)    Exam:   General:  Alert. FF commands  Cardiovascular: RRR  Respiratory: CTAB  Abdomen: Soft/NT/ND/+BS  Musculoskeletal: 5/5 bue strength, 5/5 ble strength  Data Reviewed: Basic Metabolic Panel:  Recent Labs Lab 06/05/13 0915 06/06/13 0518 06/07/13 0355  NA 132* 131* 132*  K 3.8 3.1* 4.2  CL 95* 97 100  CO2 23 22 21   GLUCOSE 203* 194* 187*  BUN 10 11 19   CREATININE 0.61 0.55 0.63  CALCIUM 9.5 8.7 9.3  MG 1.8 1.9  --    Liver Function Tests:  Recent Labs Lab 06/05/13 0915  AST 17  ALT 13  ALKPHOS 66  BILITOT 0.6  PROT 7.1  ALBUMIN 4.1   No results found for this basename: LIPASE, AMYLASE,  in the last 168 hours No results found for this basename: AMMONIA,  in the last 168 hours CBC:  Recent Labs Lab 06/05/13 0915 06/06/13 0518 06/07/13 0355  WBC 10.2 5.2 9.2  NEUTROABS 8.5*  --   --   HGB 14.0 13.0 13.0  HCT 40.0 36.7 37.0  MCV 87.0 85.7 86.9  PLT 391 324 321   Cardiac Enzymes:  Recent  Labs Lab 06/05/13 0915  TROPONINI <0.30   BNP (last 3 results) No results found for this basename: PROBNP,  in the last 8760 hours CBG:  Recent Labs Lab 06/05/13 1009  GLUCAP 192*    Recent Results (from the past 240 hour(s))  URINE CULTURE     Status: None   Collection Time    06/05/13 10:33 AM      Result Value Range Status   Specimen Description URINE, CATHETERIZED   Final   Special Requests NONE   Final   Culture  Setup Time      Final   Value: 06/05/2013 20:02     Performed at [facility] Count     Final   Value: NO GROWTH     Performed at [facility]   Culture     Final   Value: NO GROWTH     Performed at [facility]   Report Status 06/06/2013 FINAL   Final  CULTURE, BLOOD (ROUTINE X 2)     Status: None   Collection Time    06/05/13 11:25 AM      Result Value Range Status   Specimen Description BLOOD LEFT HAND   Final   Special Requests BOTTLES DRAWN AEROBIC AND ANAEROBIC 2 CC EA   Final   Culture  Setup Time     Final   Value: 06/05/2013 19:01     Performed at [facility]   Culture     Final   Value:        BLOOD CULTURE RECEIVED NO GROWTH TO DATE CULTURE WILL BE HELD FOR 5 DAYS BEFORE ISSUING A FINAL NEGATIVE REPORT     Performed at [facility]   Report Status PENDING   Incomplete  CULTURE, BLOOD (ROUTINE X 2)     Status: None   Collection Time    06/05/13 11:35 AM      Result Value Range Status   Specimen Description BLOOD RIGHT HAND   Final   Special Requests BOTTLES DRAWN AEROBIC AND ANAEROBIC 2 CC EA   Final   Culture  Setup Time     Final   Value: 06/05/2013 19:01     Performed at [facility]   Culture     Final   Value:        BLOOD CULTURE RECEIVED NO GROWTH TO DATE CULTURE WILL BE HELD FOR 5 DAYS BEFORE ISSUING A FINAL NEGATIVE REPORT     Performed at [facility]   Report Status PENDING   Incomplete  MRSA PCR SCREENING     Status: None   Collection Time    06/05/13  3:21 PM      Result Value Range Status   MRSA by PCR NEGATIVE  NEGATIVE Final   Comment:            The GeneXpert MRSA Assay (FDA     approved for NASAL specimens     only), is one component of a     comprehensive MRSA colonization     surveillance program. It is not     intended to diagnose MRSA     infection nor to guide or     monitor treatment for     MRSA infections.     Studies: Mr Brain Wo Contrast  06/05/2013   CLINICAL DATA:   Altered mental status with fever and ataxia.  EXAM: MRI HEAD WITHOUT CONTRAST  TECHNIQUE: Multiplanar, multisequence MR  imaging was performed. No intravenous contrast was administered.  COMPARISON:  Head CT from same day.  FINDINGS: Calvarium and upper cervical spine: No marrow signal abnormality. Hyperostosis of the frontal calvarium.  Orbits: Bilateral cataract resection.  Sinuses: Atelectatic right maxillary sinus with mild mucosal thickening Mastoid and middle ears are clear.  Brain: No acute abnormality such as acute infarct, hemorrhage, hydrocephalus, or mass lesion. No evidence of large vessel occlusion. Patchy bilateral cerebral white matter FLAIR hyperintensity consistent with chronic small vessel ischemia. Remote lacunar infarct in the right caudate body.  IMPRESSION: 1. No evidence of acute intracranial disease. 2. Chronic small vessel ischemic injury.   Electronically Signed   By: Tiburcio Pea   On: 06/05/2013 22:17   Dg Chest Port 1 View  06/06/2013   *RADIOLOGY REPORT*  Clinical Data: Fever  PORTABLE CHEST - 1 VIEW  Comparison: 06/05/2013  Findings: Stable cardiac silhouette.  Vascular pattern normal. Lungs clear except for mild scarring or discoid atelectasis left lung base.  IMPRESSION: No acute findings   Original Report Authenticated By: Esperanza Heir, M.D.    Scheduled Meds: . acetaminophen  650 mg Oral Once  . ampicillin (OMNIPEN) IV  2 g Intravenous Q4H  . apixaban  5 mg Oral BID  . cefTRIAXone (ROCEPHIN)  IV  2 g Intravenous Q12H  . diltiazem  180 mg Oral Daily  . docusate sodium  100 mg Oral BID  . escitalopram  10 mg Oral QHS  . feeding supplement  237 mL Oral BID BM  . folic acid  1 mg Intravenous Daily  . losartan  100 mg Oral Daily  . metoprolol succinate  50 mg Oral Daily  . omega-3 acid ethyl esters  1 g Oral Daily  . pantoprazole (PROTONIX) IV  40 mg Intravenous Q24H  . sodium chloride  3 mL Intravenous Q12H  . thiamine  100 mg Intravenous Daily  . vancomycin   500 mg Intravenous Q12H   Continuous Infusions:   Principal Problem:   Encephalopathy acute Active Problems:   Hypertension   A-fib   Depression   Tremor   Abnormality of gait   Insomnia, unspecified   Other and unspecified alcohol dependence, continuous drinking behavior   Fever   Hypokalemia    Time spent: > 30 mins    [facility]  [facility] Pager [phone]. If 7PM-7AM, please contact night-coverage at [email or web address], password [email or web address] 06/07/2013, 12:55 PM  LOS: 2 days

## 2013-06-07 NOTE — Progress Notes (Signed)
Inpatient Diabetes Program Recommendations  AACE/ADA: New Consensus Statement on Inpatient Glycemic Control (2013)  Target Ranges:  Prepandial:   less than 140 mg/dL      Peak postprandial:   less than 180 mg/dL (1-2 hours)      Critically ill patients:  140 - 180 mg/dL   Reason for Visit: Hyperglycemia  Results for JANDA, CARGO (MRN [id number]) as of 06/07/2013 13:07  Ref. Range 06/05/2013 09:15 06/06/2013 05:18 06/07/2013 03:55  Glucose Latest Range: 70-99 mg/dL 782 (H) 956 (H) 213 (H)  Results for CELLIE, DARDIS (MRN [id number]) as of 06/07/2013 13:07  Ref. Range 06/05/2013 10:00  Ketones, ur Latest Range: NEGATIVE mg/dL 40 (A)    Inpatient Diabetes Program Recommendations HgbA1C: Check HgbA1C to assess glycemic control prior to hospitalization  Note: Will follow.  Thanks. Ailene Ards, RD, LDN, CDE Inpatient Diabetes Coordinator [phone]

## 2013-06-07 NOTE — Progress Notes (Signed)
Pt had been alert, oriented x 3 at the beginning of this RN's shift.  Around 0030 am, the pt became easily startled, confused and easily agitated. Pt refusing for me to touch her arm in order to administer her IV Decadron.  Pt very confused, stating that we were plotting to kill her.  This RN reassured the pt we were only trying to help her get better and explained to her which medications I was giving her through her IV (Decadron and IV abx).  Pt pulled IV out of left forearm a few minutes later.  New PIV access obtained in R forearm. Pt trying to get up OOB to the BR on multiple occasions by herself.  Will continue to monitor pt.

## 2013-06-07 NOTE — Consult Note (Signed)
[facility]    Date of Admission:  06/05/2013           Day 3 vancomycin        Day 3 ceftriaxone        Day 3 ampicillin       Reason for Consult: Fever and confusion    Referring Physician: Dr. Ramiro Harvest  Patient Active Problem List   Diagnosis Date Noted  . Fever 06/05/2013    Priority: High  . Encephalopathy acute 06/05/2013    Priority: Medium  . Hyperglycemia 06/07/2013  . DJD (degenerative joint disease) 06/07/2013  . Hypokalemia 06/06/2013  . Abnormality of gait 06/02/2013  . UTI (urinary tract infection) 05/21/2013  . Unintentional weight loss 03/29/2013  . Cough 03/29/2013  . Tremor 01/11/2013  . Nausea alone 01/11/2013  . Depression   . Other and unspecified alcohol dependence, continuous drinking behavior 10/28/2012  . Insomnia, unspecified 10/27/2012  . A-fib   . PVC (premature ventricular contraction)   . Hypertension 10/08/2012    . acetaminophen  650 mg Oral Once  . ampicillin (OMNIPEN) IV  2 g Intravenous Q4H  . apixaban  5 mg Oral BID  . cefTRIAXone (ROCEPHIN)  IV  2 g Intravenous Q12H  . diltiazem  180 mg Oral Daily  . docusate sodium  100 mg Oral BID  . escitalopram  10 mg Oral QHS  . feeding supplement  237 mL Oral BID BM  . folic acid  1 mg Intravenous Daily  . losartan  100 mg Oral Daily  . metoprolol succinate  50 mg Oral Daily  . omega-3 acid ethyl esters  1 g Oral Daily  . [START ON 06/08/2013] pantoprazole  40 mg Oral Q0600  . sodium chloride  3 mL Intravenous Q12H  . [START ON 06/08/2013] thiamine  100 mg Oral Daily  . vancomycin  500 mg Intravenous Q12H    Recommendations: 1. Continue vancomycin and ceftriaxone 2. Discontinue ampicillin 3. Restart Ambien at bedtime   Assessment: At this point, I cannot determine what caused her recent fever and confusion. She seems to be improving rapidly with broad empiric antibiotic therapy. Although I think acute bacterial meningitis occurring right  as she was completing ciprofloxacin is unlikely, I cannot rule out that possibility completely. Acute listeria infection is extremely rare so I favor continuing empiric vancomycin and ceftriaxone for 4 more days and stopping ampicillin. Her acute, transient confusion last night could be related to sundowning or Ambien withdrawal. Her notes from [facility] assisted-living facility state that she takes Ambien every night and has for the past 20 years. I will restart Ambien now.    HPI: [name] is a 79 y.o. female who has been in declining health over the past 9 months with anorexia, unintentional weight loss and tremors. At the end of August she began to develop some urinary urgency and frequency. She was started on oral ciprofloxacin was given only a two-day supply. Urine culture grew Escherichia coli susceptible to all antibiotics tested. She had persistent symptoms and was seen again on September 3. The ciprofloxacin was restarted and she states that she thought she was getting better. She did, however, have some persistent urinary incontinence and recalls having some mild intermittent headache up to 6/10. On September 13 she was noted to be acutely confused and febrile and was brought here where her temperature was recorded to be 102.4. Lumbar puncture was not obtained  because she is on xarelto for chronic atrial fibrillation. Admission urinalysis was unremarkable. Her chest x-ray showed no acute abnormalities. Her brain CT and MRI showed no acute abnormalities. Some debris was noted in the right maxillary sinus. Urine cultures and blood cultures are negative. I located a urine culture done on September 12 that grew coagulase-negative staph. She has defervesced and is improving but became acutely confused last night again.   Review of Systems: Constitutional: positive for anorexia, chills, fevers and weight loss, negative for sweats Eyes: negative Ears, nose, mouth, throat, and face:  negative Respiratory: negative Cardiovascular: negative Gastrointestinal: positive for constipation, diarrhea and nausea, negative for vomiting Genitourinary:positive for frequency, urinary incontinence and urgency, negative for hematuria Integument/breast: positive for easy brusing, negative for rash  Past Medical History  Diagnosis Date  . A-fib   . Hypertension   . Arthritis   . Cataracts, bilateral   . Other acariasis     peripherial neuropathy  . History of stomach ulcers   . GI bleed 06/2002  . UTI (lower urinary tract infection)     pt state uti w/ no pain  . PVC (premature ventricular contraction)   . Migraines   . Abdominal pain, unspecified site   . Diverticulosis of colon (without mention of hemorrhage)   . Hemorrhage of gastrointestinal tract, unspecified   . Unspecified hereditary and idiopathic peripheral neuropathy   . Osteoarthrosis, unspecified whether generalized or localized, lower leg   . Palpitations   . Depression   . Other and unspecified alcohol dependence, continuous drinking behavior 10/28/2012  . Nausea alone 10/28/2012  . Obesity, unspecified 10/27/2012  . Diverticulosis of colon (without mention of hemorrhage) 10/27/2012  . Insomnia, unspecified 10/27/2012  . Edema 10/27/2012  . Localized osteoarthrosis not specified whether primary or secondary, lower leg 05/31/2011  . Abnormality of gait 06/02/2013    History  Substance Use Topics  . Smoking status: Former Smoker    Types: Cigarettes    Quit date: 10/09/1983  . Smokeless tobacco: Never Used  . Alcohol Use: 0.0 oz/week    1-2 Shots of liquor per week     Comment: daily  scotch     History reviewed. No pertinent family history. Allergies  Allergen Reactions  . Mirtazapine     tremor  . Nickel Other (See Comments)    inflammation  . Other     Prednisone causes sleep disturbances    OBJECTIVE: Blood pressure 133/59, pulse 66, temperature 98.7 F (37.1 C), temperature source Oral, resp. rate  20, height 5' 7.5" (1.715 m), weight 63.3 kg (139 lb 8.8 oz), SpO2 98.00%. General: She is currently walking in the hallway with the physical therapist. She is using a rolling walker Skin: Bruising over both arms. No acute rash. Neck: Supple Eyes: Normal external exam except status post cataract extractions bilaterally Lymph nodes: No palpable adenopathy Oral: No oropharyngeal lesions Lungs: Clear Cor: Regular S1 and S2 no murmurs Abdomen: Soft and nontender with no palpable masses. No CVA tenderness Neuro: Alert with normal speech and conversation although she says that she feels like she has to search for her words. No significant tremor noted. Joints and extremities: No acute abnormality Mood and affect: Normal  Microbiology: Recent Results (from the past 240 hour(s))  URINE CULTURE     Status: None   Collection Time    06/05/13 10:33 AM      Result Value Range Status   Specimen Description URINE, CATHETERIZED   Final   Special Requests  NONE   Final   Culture  Setup Time     Final   Value: 06/05/2013 20:02     Performed at [facility] Count     Final   Value: NO GROWTH     Performed at [facility]   Culture     Final   Value: NO GROWTH     Performed at [facility]   Report Status 06/06/2013 FINAL   Final  CULTURE, BLOOD (ROUTINE X 2)     Status: None   Collection Time    06/05/13 11:25 AM      Result Value Range Status   Specimen Description BLOOD LEFT HAND   Final   Special Requests BOTTLES DRAWN AEROBIC AND ANAEROBIC 2 CC EA   Final   Culture  Setup Time     Final   Value: 06/05/2013 19:01     Performed at [facility]   Culture     Final   Value:        BLOOD CULTURE RECEIVED NO GROWTH TO DATE CULTURE WILL BE HELD FOR 5 DAYS BEFORE ISSUING A FINAL NEGATIVE REPORT     Performed at [facility]   Report Status PENDING   Incomplete  CULTURE, BLOOD (ROUTINE X 2)     Status: None   Collection Time    06/05/13 11:35 AM       Result Value Range Status   Specimen Description BLOOD RIGHT HAND   Final   Special Requests BOTTLES DRAWN AEROBIC AND ANAEROBIC 2 CC EA   Final   Culture  Setup Time     Final   Value: 06/05/2013 19:01     Performed at [facility]   Culture     Final   Value:        BLOOD CULTURE RECEIVED NO GROWTH TO DATE CULTURE WILL BE HELD FOR 5 DAYS BEFORE ISSUING A FINAL NEGATIVE REPORT     Performed at [facility]   Report Status PENDING   Incomplete  MRSA PCR SCREENING     Status: None   Collection Time    06/05/13  3:21 PM      Result Value Range Status   MRSA by PCR NEGATIVE  NEGATIVE Final   Comment:            The GeneXpert MRSA Assay (FDA     approved for NASAL specimens     only), is one component of a     comprehensive MRSA colonization     surveillance program. It is not     intended to diagnose MRSA     infection nor to guide or     monitor treatment for     MRSA infections.    Cliffton Asters, MD [facility] [phone] pager   [phone] cell 06/07/2013, 4:14 PM

## 2013-06-07 NOTE — Progress Notes (Signed)
Physical Therapy Treatment Patient Details Name: [name] MRN: [id number] DOB: September 26, 1932 Today's Date: 06/07/2013 Time: 5621-3086 PT Time Calculation (min): 25 min  PT Assessment / Plan / Recommendation  History of Present Illness  acute encephalopathy; Questionable etiology. Significant clinical improvement. Patient had presented with a febrile illness and being treated empirically for CNS infection. CT of the head was negative. MRI of the head with no acute abnormalities. Repeat chest x-ray is negative. Urinalysis negative. Blood cultures are pending. Continue empiric IV vancomycin, Rocephin, ampicillin, IV Decadron. Neurology is following and appreciate input and recommendations   PT Comments   Pt OOB in recliner with visitor in room.  Amb in hallway twice with one sitting rest break.  Tremors noted throughout.  Unsteady/shaky gait.   Follow Up Recommendations  SNF     Does the patient have the potential to tolerate intense rehabilitation     Barriers to Discharge        Equipment Recommendations  None recommended by PT    Recommendations for Other Services    Frequency Min 3X/week   Progress towards PT Goals Progress towards PT goals: Progressing toward goals  Plan      Precautions / Restrictions Precautions Precautions: Fall Precaution Comments: mild tremors Restrictions Weight Bearing Restrictions: No    Pertinent Vitals/Pain No c/o pain "I just don't feel right"    Mobility  Bed Mobility Bed Mobility: Not assessed Details for Bed Mobility Assistance: Pt OOB in recliner Transfers Transfers: Sit to Stand;Stand to Sit Sit to Stand: 4: Min assist;From chair/3-in-1;With upper extremity assist Stand to Sit: 4: Min assist;To bed;With upper extremity assist Details for Transfer Assistance: cues for safety and attention to task Ambulation/Gait Ambulation/Gait Assistance: 4: Min assist Ambulation Distance (Feet): 150 Feet (75 feet x2 one sitting rest  break) Assistive device: Rolling walker Ambulation/Gait Assistance Details: 25% VC's on safety with turns and directing RW around obsticles safely Gait Pattern: Step-through pattern;Decreased stride length;Scissoring;Right foot flat;Left foot flat Gait velocity: decreased     PT Goals (current goals can now be found in the care plan section)    Visit Information  Last PT Received On: 06/07/13 Assistance Needed: +1 History of Present Illness:  acute encephalopathy; Questionable etiology. Significant clinical improvement. Patient had presented with a febrile illness and being treated empirically for CNS infection. CT of the head was negative. MRI of the head with no acute abnormalities. Repeat chest x-ray is negative. Urinalysis negative. Blood cultures are pending. Continue empiric IV vancomycin, Rocephin, ampicillin, IV Decadron. Neurology is following and appreciate input and recommendations    Subjective Data      Cognition       Balance     End of Session PT - End of Session Equipment Utilized During Treatment: Gait belt Activity Tolerance: Patient limited by fatigue Patient left: in chair;with call bell/phone within reach;with family/visitor present   Felecia Shelling  PTA [facility]  Acute  Rehab Pager      [phone]

## 2013-06-07 NOTE — Clinical Social Work Psychosocial (Signed)
     Clinical Social Work Department BRIEF PSYCHOSOCIAL ASSESSMENT 06/07/2013  Patient:  [name], [name]     Account Number:  [id number]     Admit date:  06/05/2013  Clinical Social Worker:  Hattie Perch  Date/Time:  06/07/2013 12:00 M  Referred by:  Physician  Date Referred:  06/07/2013 Referred for  SNF Placement   Other Referral:   Interview type:  Patient Other interview type:    PSYCHOSOCIAL DATA Living Status:  FACILITY Admitted from facility:  [facility] Level of care:  Independent Living Primary support name:  Stark Jock Primary support relationship to patient:  NONE Degree of support available:   good    CURRENT CONCERNS Current Concerns  Post-Acute Placement   Other Concerns:    SOCIAL WORK ASSESSMENT / PLAN CSW met with patient. patient is alert and oriented X3. CSW recieved consult that patient was admitted from a snf. patient states that she lives in the independent living section of [facility] and intends to return there upon discharge. she is very happy living there and does not anticipate any needs.   Assessment/plan status:   Other assessment/ plan:   Information/referral to community resources:    PATIENTS/FAMILYS RESPONSE TO PLAN OF CARE: patient is very happy as a resident of [facility] independent living and does not anticipate needing a higher level of care upon discharge. no further CSW needs noted. CSW signing off.

## 2013-06-07 NOTE — Evaluation (Signed)
Occupational Therapy Evaluation Patient Details Name: [name] MRN: [id number] DOB: 1933-03-10 Today's Date: 06/07/2013 Time: 4098-1191 OT Time Calculation (min): 21 min  OT Assessment / Plan / Recommendation History of present illness  acute encephalopathy; Questionable etiology. Significant clinical improvement. Patient had presented with a febrile illness and being treated empirically for CNS infection. CT of the head was negative. MRI of the head with no acute abnormalities. Repeat chest x-ray is negative. Urinalysis negative. Blood cultures are pending. Continue empiric IV vancomycin, Rocephin, ampicillin, IV Decadron. Neurology is following and appreciate input and recommendations   Clinical Impression   Pt presents to OT with decreased I with ADL activity and will benefit from skilled OT to increase I in order for pt to eventually return to ILF apartment    OT Assessment  Patient needs continued OT Services    Follow Up Recommendations  SNF;Home health OT;Supervision/Assistance - 24 hour;Other (comment) (rehab at [facility])       Equipment Recommendations  None recommended by OT       Frequency  Min 2X/week    Precautions / Restrictions Precautions Precautions: Fall       ADL  Grooming: Performed;Wash/dry face;Brushing hair Where Assessed - Grooming: Unsupported sitting Upper Body Dressing: Simulated;Minimal assistance Where Assessed - Upper Body Dressing: Unsupported sitting Lower Body Dressing: Moderate assistance Where Assessed - Lower Body Dressing: Unsupported sit to stand Toilet Transfer: Performed;Minimal assistance Toilet Transfer Method: Sit to stand Toilet Transfer Equipment: Comfort height toilet Toileting - Clothing Manipulation and Hygiene: Simulated;Moderate assistance Where Assessed - Toileting Clothing Manipulation and Hygiene: Standing Transfers/Ambulation Related to ADLs: Pt will need A with ADL activity at home post DC.  Pt will benefit from  post acute therapy in order to regain I with ADL activity     OT Diagnosis: Generalized weakness  OT Problem List: Decreased strength;Decreased activity tolerance;Impaired balance (sitting and/or standing) OT Treatment Interventions: Self-care/ADL training;Patient/family education;Therapeutic activities   OT Goals(Current goals can be found in the care plan section) ADL Goals Pt Will Perform Grooming: with supervision;standing Pt Will Perform Upper Body Dressing: with supervision;sitting Pt Will Perform Lower Body Dressing: with supervision;sit to/from stand Pt Will Transfer to Toilet: with supervision;regular height toilet Pt Will Perform Toileting - Clothing Manipulation and hygiene: with supervision;sit to/from stand  Visit Information  Last OT Received On: 06/07/13 Assistance Needed: +1 History of Present Illness:  acute encephalopathy; Questionable etiology. Significant clinical improvement. Patient had presented with a febrile illness and being treated empirically for CNS infection. CT of the head was negative. MRI of the head with no acute abnormalities. Repeat chest x-ray is negative. Urinalysis negative. Blood cultures are pending. Continue empiric IV vancomycin, Rocephin, ampicillin, IV Decadron. Neurology is following and appreciate input and recommendations       Prior Functioning     Home Living Additional Comments: Pt lives in ILF at [facility] and has help 20 hours a day. Pt would benefit from rehab area at [facility] prior to DC back to IL apartment Prior Function Level of Independence: Independent with assistive device(s) Communication Communication: No difficulties         Vision/Perception Vision - History Baseline Vision: Wears glasses all the time   Cognition  Cognition Arousal/Alertness: Awake/alert Behavior During Therapy: WFL for tasks assessed/performed Overall Cognitive Status: Within Functional Limits for tasks assessed    Extremity/Trunk  Assessment Upper Extremity Assessment Upper Extremity Assessment:  (noted mild resting tremors. Pts reports worse yesterday)     Mobility Bed Mobility Bed Mobility:  Supine to Sit Supine to Sit: 4: Min guard;HOB flat Transfers Transfers: Sit to Stand;Stand to Sit Sit to Stand: 4: Min assist;From chair/3-in-1;With upper extremity assist Stand to Sit: 4: Min assist;To bed;With upper extremity assist        Balance Static Standing Balance Static Standing - Balance Support: Bilateral upper extremity supported Static Standing - Level of Assistance: 4: Min assist   End of Session OT - End of Session Activity Tolerance: Patient tolerated treatment well Patient left: in chair;with call bell/phone within reach;with family/visitor present  GO     Loriene Taunton, Metro Kung 06/07/2013, 10:59 AM

## 2013-06-07 NOTE — Progress Notes (Signed)
Utilization review completed.  

## 2013-06-08 ENCOUNTER — Inpatient Hospital Stay (HOSPITAL_COMMUNITY): Payer: Medicare Other

## 2013-06-08 DIAGNOSIS — R509 Fever, unspecified: Secondary | ICD-10-CM | POA: Diagnosis not present

## 2013-06-08 DIAGNOSIS — I4891 Unspecified atrial fibrillation: Secondary | ICD-10-CM | POA: Diagnosis not present

## 2013-06-08 DIAGNOSIS — I10 Essential (primary) hypertension: Secondary | ICD-10-CM | POA: Diagnosis not present

## 2013-06-08 DIAGNOSIS — Z452 Encounter for adjustment and management of vascular access device: Secondary | ICD-10-CM | POA: Diagnosis not present

## 2013-06-08 DIAGNOSIS — G9340 Encephalopathy, unspecified: Secondary | ICD-10-CM | POA: Diagnosis not present

## 2013-06-08 LAB — BASIC METABOLIC PANEL
BUN: 17 mg/dL (ref 6–23)
Calcium: 8.9 mg/dL (ref 8.4–10.5)
Creatinine, Ser: 0.68 mg/dL (ref 0.50–1.10)
GFR calc Af Amer: >90 mL/min (ref >90)
GFR calc non Af Amer: 82 mL/min — ABNORMAL LOW (ref >90)

## 2013-06-08 LAB — CBC
HCT: 34.1 % — ABNORMAL LOW (ref 36.0–46.0)
MCH: 30.2 pg (ref 26.0–34.0)
MCHC: 34.3 g/dL (ref 30.0–36.0)
MCV: 87.9 fL (ref 78.0–100.0)
Platelets: 271 10*3/uL (ref 150–400)
RDW: 13.9 % (ref 11.5–15.5)

## 2013-06-08 LAB — HEMOGLOBIN A1C: Hgb A1c MFr Bld: 5.2 % (ref <5.7)

## 2013-06-08 MED ORDER — DEXTROSE 5 % IV SOLN: 2.0000 g | Freq: Two times a day (BID) | INTRAVENOUS | Status: AC

## 2013-06-08 MED ORDER — ZOLPIDEM TARTRATE 5 MG PO TABS: 5.0000 mg | ORAL_TABLET | Freq: Every evening | ORAL | Status: DC | PRN

## 2013-06-08 MED ORDER — VANCOMYCIN HCL IN DEXTROSE 750-5 MG/150ML-% IV SOLN
750.0000 mg | Freq: Two times a day (BID) | INTRAVENOUS | Status: DC
  Administered 2013-06-08: 12:00:00 750 mg via INTRAVENOUS
  Filled 2013-06-08 (×2): qty 150

## 2013-06-08 MED ORDER — ENSURE COMPLETE PO LIQD: 237.0000 mL | Freq: Two times a day (BID) | ORAL | Status: DC

## 2013-06-08 MED ORDER — VANCOMYCIN HCL IN DEXTROSE 750-5 MG/150ML-% IV SOLN: 750.0000 mg | Freq: Two times a day (BID) | INTRAVENOUS | Status: AC

## 2013-06-08 MED ORDER — THIAMINE HCL 100 MG PO TABS: 100.0000 mg | ORAL_TABLET | Freq: Every day | ORAL | Status: DC

## 2013-06-08 MED ORDER — PANTOPRAZOLE SODIUM 40 MG PO TBEC: 40.0000 mg | DELAYED_RELEASE_TABLET | Freq: Every day | ORAL | Status: DC

## 2013-06-08 MED ORDER — DEXTROSE 5 % IV SOLN: 2.0000 g | Freq: Two times a day (BID) | INTRAVENOUS | Status: DC

## 2013-06-08 NOTE — Procedures (Signed)
PICC placement No complication No blood loss. See complete dictation in [facility].

## 2013-06-08 NOTE — Progress Notes (Signed)
Report called to [facility].  Pt in IR now getting PICC line. Son in law to transport home after.

## 2013-06-08 NOTE — Clinical Social Work Placement (Signed)
     Clinical Social Work Department CLINICAL SOCIAL WORK PLACEMENT NOTE 06/08/2013  Patient:  [name], [name]  Account Number:  [id number] Admit date:  06/05/2013  Clinical Social Worker:  Becky Sax, LCSW  Date/time:  06/08/2013 12:00 M  Clinical Social Work is seeking post-discharge placement for this patient at the following level of care:   SKILLED NURSING   (*CSW will update this form in Epic as items are completed)   06/08/2013  Patient/family provided with [facility] Department of Clinical Social Works list of facilities offering this level of care within the geographic area requested by the patient (or if unable, by the patients family).  06/08/2013  Patient/family informed of their freedom to choose among providers that offer the needed level of care, that participate in Medicare, Medicaid or managed care program needed by the patient, have an available bed and are willing to accept the patient.  06/08/2013  Patient/family informed of [facility] ownership interest in [facility], as well as of the fact that they are under no obligation to receive care at this facility.  PASARR submitted to EDS on 06/08/2013 PASARR number received from EDS on 06/08/2013  FL2 transmitted to all facilities in geographic area requested by pt/family on  06/08/2013 FL2 transmitted to all facilities within larger geographic area on   Patient informed that his/her managed care company has contracts with or will negotiate with  certain facilities, including the following:     Patient/family informed of bed offers received:  06/08/2013 Patient chooses bed at [facility] Physician recommends and patient chooses bed at    Patient to be transferred to [facility] on  06/08/2013 Patient to be transferred to facility by family  The following physician request were entered in Epic:   Additional Comments:

## 2013-06-08 NOTE — Discharge Summary (Signed)
Physician Discharge Summary  [name] ZOX:[id number] DOB: September 14, 1933 DOA: 06/05/2013  PCP: Martha Relic, MD  Admit date: 06/05/2013 Discharge date: 06/08/2013  Time spent: 70 minutes  Recommendations for Outpatient Follow-up:  1. Followup with [name], Martha Curt, MD in 1 week.  Discharge Diagnoses:  Principal Problem:   Fever Active Problems:   Hypertension   A-fib   Depression   Tremor   Unintentional weight loss   Abnormality of gait   Insomnia, unspecified   Other and unspecified alcohol dependence, continuous drinking behavior   Encephalopathy acute   Hypokalemia   Hyperglycemia   DJD (degenerative joint disease)   Discharge Condition: Stable and improved  Diet recommendation: Regular  Filed Weights   06/05/13 1238 06/06/13 0608 06/08/13 0213  Weight: 64.6 kg (142 lb 6.7 oz) 63.3 kg (139 lb 8.8 oz) 67.314 kg (148 lb 6.4 oz)    History of present illness:  [name] is a 79 y.o. female  With history of afib on anticoagulation, HTN, tremors since 09/2012, gait abnormality, falls, hx of ETOH dependence who live as [facility] facility who presents to ED with 1 day hx of fever, chills, worsening confusion from baseline, non productive cough.  Patient is confused speech is incoherent and a such information was obtained from patient's daughter [name]. The patient's daughter the patient has not had any chest pain, no shortness of breath, no nausea, no vomiting, no abdominal pain, no diarrhea, no constipation no dysuria. Patient's daughter does endorse the patient is at some generalized weakness has been incontinent and was being treated for urinary tract infection and received about 8 day course of ciprofloxacin which was finished over the past 2-3 days. Patient is also had decreased oral intake. Patient's daughter also states the patient has had some tremors since general this year was seen by a neurologist in the past was a dose nothing else that can be done. Per  patient's daughter patient has had some gait abnormalities as well at the PCP had ordered the MRI of the brain to be done next week Friday.  Patient seen in the ED urinalysis which was done was nitrite negative leukocytes negative. Chest x-ray which was done was unremarkable. Blood cultures were drawn. Comprehensive metabolic profile of 10 and a sodium of 132 chloride of 95 glucose of 203 otherwise was within normal limits. EKG showed age of fibrillation. Lactic acid level is within normal limits at 1.89. Troponin was negative. CBC was within normal limits however had a left shift. Due to patient being on chronic anticoagulation and lumbar puncture could not be done. Patient was given a dose of IV vancomycin and Rocephin to cover empirically for probable meningitis.  We were called to admit the patient for further evaluation and management.   Hospital Course:  #1 acute encephalopathy/ Prob CNS infection/Fever  Patient was admitted with lethargy, chills, fever. Patient was pancultured. CXR neg. CT head neg. Due to patient being  Chronically on anticoagulation an LP could not be attempted. Patient was admitted and placed empirically on IV Vancomycin, IV Rocephin, IV ampicillin and IV Decadron empirically for presumed CNS infection. Neurology consultation was also obtained and patient seen by Martha. Cyril [name] of neurology. MRI of the head was recommended which was negative. Patient improved clinically significantly on this regimen and was recommended that patient be treated empirically for about a week. ID consultation was obtained for antibiotic regimen and was felt per ID to continue IV vancomycin and Rocephin for 4 more days to  complete a one-week course of antibiotic therapy. It was recommended to discontinue ampicillin. Patient's IV Decadron was also discontinued. Patient continued to improve clinically. Urine cultures which were obtained were negative. Blood cultures had no growth to date. Chest x-ray which was  done was negative. During the hospitalization patient did have a bout of confusion which was thought to be secondary to sundowning versus withdrawal from Ambien versus steroid psychosis. Steroids were discontinued. Patient's Ambien was resumed at 5 mg daily with resolution of her confusion. Patient will be discharged to a skilled nursing facility on IV vancomycin and Rocephin for 4 more days to complete a one-week course of antibiotic therapy. Patient will followup with PCP as outpatient. Patient be discharged in stable and improved condition. #2 hypokalemia  During the hospitalization patient was noted to be hypokalemic. Patient's potassium was repleted. #3 atrial fibrillation  Remained stable throughout the hospitalization. Patient was maintained on Lopressor and Cardizem for rate control. Eliquis for anticoagulation.  #4 hypertension  Remained stable. Patient was maintained on cardizem and Lopressor.  #5 tremor/gait abnormality  Questionable etiology. Likely secondary to dementia. B12 level is within normal limits. TSH within normal limits. RBC folate within normal limits. RPR was nonreactive. MRI of the head with no acute abnormalities. Patient was seen by neurology it was felt that patient's bilateral tremor was likely secondary to a primary degenerative CNS disorder such as dementia and had worsened in the setting of infection. Was recommended that patient followup with her primary neurologist as outpatient. #6 insomnia  Patient's Ambien was held on admission secondary to altered mental status. Once patient had improved significantly Ambien was resumed at half home dose of 5 mg daily. Patient be discharged on Ambien 5 mg daily as needed.  #7 history of gastric ulcers  Patient was maintained on Protonix throughout the hospitalization and was discharged on Protonix 40 mg daily.   Procedures: MRI head 06/05/13  CT head 06/05/13  CXR 06/05/13, 06/05/13 PICC/ Midline  06/08/13     Consultations: Neurology: [name] 06/05/13  ID Martha [name] 06/07/13   Discharge Exam: Filed Vitals:   06/08/13 0410  BP: 142/58  Pulse: 83  Temp: 98.4 F (36.9 C)  Resp: 24    General: nad Cardiovascular: rrr Respiratory: ctab  Discharge Instructions      Discharge Orders   Future Appointments Provider Department Dept Phone   06/11/2013 11:00 AM [id number] AT [address] [phone]   Patient to arrive 15 minutes prior to appointment time.   07/12/2013 1:45 PM Martha Relic, MD [facility] [phone]   Future Orders Complete By Expires   Diet general  As directed    Discharge instructions  As directed    Comments:     Follow up with [name], Martha Curt, MD in 1 week.   Increase activity slowly  As directed        Medication List         acetaminophen 325 MG tablet  Commonly known as:  TYLENOL  Take 650 mg by mouth 2 (two) times daily as needed. For pain     apixaban 5 MG Tabs tablet  Commonly known as:  ELIQUIS  Take 1 tablet (5 mg total) by mouth 2 (two) times daily.     cholestyramine 4 GM/DOSE powder  Commonly known as:  QUESTRAN  Take 4 grams 3 times daily to help naussea.     dextrose 5 % SOLN 50 mL with cefTRIAXone 2  G SOLR 2 g  Inject 2 g into the vein every 12 (twelve) hours. Take for 4 days.     diltiazem 180 MG 24 hr capsule  Commonly known as:  CARDIZEM CD  Take 180 mg by mouth daily.     escitalopram 10 MG tablet  Commonly known as:  LEXAPRO  Take 10 mg by mouth at bedtime.     feeding supplement Liqd  Take 237 mLs by mouth 2 (two) times daily between meals.     Fish Oil 1200 MG Caps  Take 1,200 mg by mouth daily.     losartan 100 MG tablet  Commonly known as:  COZAAR  Take 100 mg by mouth daily.     metoprolol succinate 50 MG 24 hr tablet  Commonly known as:  TOPROL-XL  Take 50 mg by mouth daily. Take with or immediately following a meal.     pantoprazole 40 MG tablet   Commonly known as:  PROTONIX  Take 1 tablet (40 mg total) by mouth daily at 6 (six) AM.     promethazine 25 MG tablet  Commonly known as:  PHENERGAN  Take 25 mg by mouth daily as needed. For nausea and vomiting     thiamine 100 MG tablet  Take 1 tablet (100 mg total) by mouth daily.     Vancomycin 750 MG/150ML Soln  Commonly known as:  VANCOCIN  Inject 150 mLs (750 mg total) into the vein every 12 (twelve) hours. Take for 4 days.     Vitamin D 2000 UNITS tablet  Take 2,000 Units by mouth daily.     zolpidem 5 MG tablet  Commonly known as:  AMBIEN  Take 1 tablet (5 mg total) by mouth at bedtime as needed for sleep.       Allergies  Allergen Reactions  . Mirtazapine     tremor  . Nickel Other (See Comments)    inflammation  . Other     Prednisone causes sleep disturbances   Follow-up Information   Follow up with [name], Martha Curt, MD. Schedule an appointment as soon as possible for a visit in 1 week.   Specialty:  Internal Medicine   Contact information:   [address] [place] [address] [phone]        The results of significant diagnostics from this hospitalization (including imaging, microbiology, ancillary and laboratory) are listed below for reference.    Significant Diagnostic Studies: Dg Chest 2 View  06/05/2013   CLINICAL DATA:  79 year old female with fever. Atrial fibrillation.  EXAM: CHEST  2 VIEW  COMPARISON:  10/08/2012 and earlier.  FINDINGS: AP and lateral views. Stable lung volumes. Stable cardiomegaly and mediastinal contours. Visualized tracheal air column is within normal limits. No pneumothorax or pulmonary edema. No pleural effusion or acute pulmonary opacity. Mild linear scarring in the lingula. Osteopenia. No acute osseous abnormality identified.  IMPRESSION: No acute cardiopulmonary abnormality.   Electronically Signed   By: Augusto Gamble M.D.   On: 06/05/2013 09:33   Ct Head Wo Contrast  06/05/2013   *RADIOLOGY REPORT*  Clinical Data:  confused, feverish, altered mental status  CT HEAD WITHOUT CONTRAST  Technique:  Contiguous axial images were obtained from the base of the skull through the vertex without contrast.  Comparison: None.  Findings: Calvarium intact.  No significant sinus inflammatory change except for fluid/debris dependently right maxillary sinus. No evidence of vascular territory infarct, hemorrhage, or extra- axial fluid.  Mild low attenuation in the deep  white matter.  Mild age related atrophy.  IMPRESSION: Age appropriate involutional change.  Inflammatory change right maxillary sinus age uncertain.   Original Report Authenticated By: Esperanza Heir, M.D.   Mr Brain Wo Contrast  06/05/2013   CLINICAL DATA:  Altered mental status with fever and ataxia.  EXAM: MRI HEAD WITHOUT CONTRAST  TECHNIQUE: Multiplanar, multisequence MR imaging was performed. No intravenous contrast was administered.  COMPARISON:  Head CT from same day.  FINDINGS: Calvarium and upper cervical spine: No marrow signal abnormality. Hyperostosis of the frontal calvarium.  Orbits: Bilateral cataract resection.  Sinuses: Atelectatic right maxillary sinus with mild mucosal thickening Mastoid and middle ears are clear.  Brain: No acute abnormality such as acute infarct, hemorrhage, hydrocephalus, or mass lesion. No evidence of large vessel occlusion. Patchy bilateral cerebral white matter FLAIR hyperintensity consistent with chronic small vessel ischemia. Remote lacunar infarct in the right caudate body.  IMPRESSION: 1. No evidence of acute intracranial disease. 2. Chronic small vessel ischemic injury.   Electronically Signed   By: Tiburcio Pea   On: 06/05/2013 22:17   Dg Chest Port 1 View  06/06/2013   *RADIOLOGY REPORT*  Clinical Data: Fever  PORTABLE CHEST - 1 VIEW  Comparison: 06/05/2013  Findings: Stable cardiac silhouette.  Vascular pattern normal. Lungs clear except for mild scarring or discoid atelectasis left lung base.  IMPRESSION: No acute  findings   Original Report Authenticated By: Esperanza Heir, M.D.    Microbiology: Recent Results (from the past 240 hour(s))  URINE CULTURE     Status: None   Collection Time    06/05/13 10:33 AM      Result Value Range Status   Specimen Description URINE, CATHETERIZED   Final   Special Requests NONE   Final   Culture  Setup Time     Final   Value: 06/05/2013 20:02     Performed at [facility] Count     Final   Value: NO GROWTH     Performed at [facility]   Culture     Final   Value: NO GROWTH     Performed at [facility]   Report Status 06/06/2013 FINAL   Final  CULTURE, BLOOD (ROUTINE X 2)     Status: None   Collection Time    06/05/13 11:25 AM      Result Value Range Status   Specimen Description BLOOD LEFT HAND   Final   Special Requests BOTTLES DRAWN AEROBIC AND ANAEROBIC 2 CC EA   Final   Culture  Setup Time     Final   Value: 06/05/2013 19:01     Performed at [facility]   Culture     Final   Value:        BLOOD CULTURE RECEIVED NO GROWTH TO DATE CULTURE WILL BE HELD FOR 5 DAYS BEFORE ISSUING A FINAL NEGATIVE REPORT     Performed at [facility]   Report Status PENDING   Incomplete  CULTURE, BLOOD (ROUTINE X 2)     Status: None   Collection Time    06/05/13 11:35 AM      Result Value Range Status   Specimen Description BLOOD RIGHT HAND   Final   Special Requests BOTTLES DRAWN AEROBIC AND ANAEROBIC 2 CC EA   Final   Culture  Setup Time     Final   Value: 06/05/2013 19:01     Performed at [facility]  Culture     Final   Value:        BLOOD CULTURE RECEIVED NO GROWTH TO DATE CULTURE WILL BE HELD FOR 5 DAYS BEFORE ISSUING A FINAL NEGATIVE REPORT     Performed at [facility]   Report Status PENDING   Incomplete  MRSA PCR SCREENING     Status: None   Collection Time    06/05/13  3:21 PM      Result Value Range Status   MRSA by PCR NEGATIVE  NEGATIVE Final   Comment:            The  GeneXpert MRSA Assay (FDA     approved for NASAL specimens     only), is one component of a     comprehensive MRSA colonization     surveillance program. It is not     intended to diagnose MRSA     infection nor to guide or     monitor treatment for     MRSA infections.     Labs: Basic Metabolic Panel:  Recent Labs Lab 06/05/13 0915 06/06/13 0518 06/07/13 0355 06/08/13 0530  NA 132* 131* 132* 136  K 3.8 3.1* 4.2 4.2  CL 95* 97 100 102  CO2 23 22 21 25   GLUCOSE 203* 194* 187* 132*  BUN 10 11 19 17   CREATININE 0.61 0.55 0.63 0.68  CALCIUM 9.5 8.7 9.3 8.9  MG 1.8 1.9  --   --    Liver Function Tests:  Recent Labs Lab 06/05/13 0915  AST 17  ALT 13  ALKPHOS 66  BILITOT 0.6  PROT 7.1  ALBUMIN 4.1   No results found for this basename: LIPASE, AMYLASE,  in the last 168 hours No results found for this basename: AMMONIA,  in the last 168 hours CBC:  Recent Labs Lab 06/05/13 0915 06/06/13 0518 06/07/13 0355 06/08/13 0530  WBC 10.2 5.2 9.2 8.1  NEUTROABS 8.5*  --   --   --   HGB 14.0 13.0 13.0 11.7*  HCT 40.0 36.7 37.0 34.1*  MCV 87.0 85.7 86.9 87.9  PLT 391 324 321 271   Cardiac Enzymes:  Recent Labs Lab 06/05/13 0915  TROPONINI <0.30   BNP: BNP (last 3 results) No results found for this basename: PROBNP,  in the last 8760 hours CBG:  Recent Labs Lab 06/05/13 1009  GLUCAP 192*       Signed:  Audra Kagel  [facility] 06/08/2013, 12:51 PM

## 2013-06-08 NOTE — Progress Notes (Signed)
Patient cleared for discharge. Packet copied and placed in [place]. Patient's son in law called and he will pick patient up at three.  Liylah Najarro C. Zyana Amaro MSW, LCSW [phone]

## 2013-06-08 NOTE — Care Management Note (Signed)
    Page 1 of 1   06/08/2013     1:14:23 PM   CARE MANAGEMENT NOTE 06/08/2013  Patient:  TIERA, MENSINGER   Account Number:  [id number]  Date Initiated:  06/08/2013  Documentation initiated by:  Lanier Clam  Subjective/Objective Assessment:   79Y/O F ADMITTED W/FEVER.     Action/Plan:   FROM INDEP [facility]IV[facility].   Anticipated DC Date:  06/08/2013   Anticipated DC Plan:  SKILLED NURSING FACILITY      DC Planning Services  CM consult      Choice offered to / List presented to:             Status of service:  Completed, signed off Medicare Important Message given?   (If response is "NO", the following Medicare IM given date fields will be blank) Date Medicare IM given:   Date Additional Medicare IM given:    Discharge Disposition:  SKILLED NURSING FACILITY  Per UR Regulation:  Reviewed for med. necessity/level of care/duration of stay  If discussed at Long Length of Stay Meetings, dates discussed:    Comments:  06/08/13 Ranie Chinchilla RN,BSN NCM [phone] FOR PICC-IR SINCE UNABLE TO DO @ BEDSIDE.FOR LONG TERM IV ABX @ SNF.SW FOLLOWING.D/C SNF.

## 2013-06-08 NOTE — Progress Notes (Signed)
Peripherally Inserted Central Catheter/Midline Placement  The IV Nurse has discussed with the patient and/or persons authorized to consent for the patient, the purpose of this procedure and the potential benefits and risks involved with this procedure.  The benefits include less needle sticks, lab draws from the catheter and patient may be discharged home with the catheter.  Risks include, but not limited to, infection, bleeding, blood clot (thrombus formation), and puncture of an artery; nerve damage and irregular heat beat.  Alternatives to this procedure were also discussed.  PICC/Midline Placement Documentation        Lisabeth Devoid 06/08/2013, 12:42 PM

## 2013-06-08 NOTE — Progress Notes (Signed)
ANTIBIOTIC CONSULT NOTE - FOLLOW UP  Pharmacy Consult for Vancomycin Indication: rule out CNS infection  Allergies  Allergen Reactions  . Mirtazapine     tremor  . Nickel Other (See Comments)    inflammation  . Other     Prednisone causes sleep disturbances    Patient Measurements: Height: 5' 7.5" (171.5 cm) Weight: 148 lb 6.4 oz (67.314 kg) IBW/kg (Calculated) : 62.75  Vital Signs: Temp: 98.4 F (36.9 C) (09/16 0410) Temp src: Oral (09/16 0410) BP: 142/58 mmHg (09/16 0410) Pulse Rate: 83 (09/16 0410) Intake/Output from previous day: 09/15 0701 - 09/16 0700 In: 751 [P.O.:601; IV Piggyback:150] Out: -  Intake/Output from this shift: Total I/O In: 480 [P.O.:480] Out: -   Labs:  Recent Labs  06/06/13 0518 06/07/13 0355 06/08/13 0530  WBC 5.2 9.2 8.1  HGB 13.0 13.0 11.7*  PLT 324 321 271  CREATININE 0.55 0.63 0.68   Estimated Creatinine Clearance: 57.5 ml/min (by C-G formula based on Cr of 0.68).  Recent Labs  06/08/13 1100  VANCOTROUGH 12.4     Assessment: 13 yoF admitted 9/13 with AMS and fever. Meningitis suspected by MD, but could not obtain LP due to Xarelto. Today is D#3 of Vancomycin, Rocephin (Ampicillin discontinued 9/15). ID was consulted and recommended 4 more days of vanc and ceftriaxone to rule out CNS infection.  Afebrile  WBC wnl  Pt still experiencing AMS  SCr 0.68 (stable), CrCl 57 mL/min  Urine culture and blood cultures with no growth  9/16 @ 1100 VT 12.4   Goal of Therapy:  Vancomycin trough level 15-20 mcg/ml  Plan:  Increase vancomycin to 750mg  IV Q12h x 4 more days No other monitoring necessary unless suspect changes in renal function, clinical status, or length of therapy.  Radigan,Megan 06/08/2013,11:45 AM   Geoffry Paradise, PharmD, BCPS Pager: [phone] 12:00 PM Pharmacy #: [id number]

## 2013-06-08 NOTE — Progress Notes (Signed)
Accessed basilic vein without difficulty however unable to thread PICC beyond the mid upper arm.   Guidewire threaded without difficulty and had good blood return.  I informed her I could assess another vein or we could send her to radiology.   She was becoming uncomfortable so opted for radiology insertion.   I called radiology and spoke with Inetta Fermo.   Gave her the information.  Pt updated also.

## 2013-06-08 NOTE — Progress Notes (Signed)
CSW was reconsulted as patient now needs IV antibiotics for a week and will need snf. CSW met with patient. She is agreeable to go to [facility] skilled section. CSW called [facility], they have a bed for patient today. CSW called patients daughter, left message.  Praveen Coia C. Verner Kopischke MSW, LCSW [phone]

## 2013-06-09 DIAGNOSIS — R279 Unspecified lack of coordination: Secondary | ICD-10-CM | POA: Diagnosis not present

## 2013-06-09 DIAGNOSIS — R4182 Altered mental status, unspecified: Secondary | ICD-10-CM | POA: Diagnosis not present

## 2013-06-09 DIAGNOSIS — N390 Urinary tract infection, site not specified: Secondary | ICD-10-CM | POA: Diagnosis not present

## 2013-06-09 DIAGNOSIS — M6281 Muscle weakness (generalized): Secondary | ICD-10-CM | POA: Diagnosis not present

## 2013-06-09 DIAGNOSIS — R269 Unspecified abnormalities of gait and mobility: Secondary | ICD-10-CM | POA: Diagnosis not present

## 2013-06-09 DIAGNOSIS — R259 Unspecified abnormal involuntary movements: Secondary | ICD-10-CM | POA: Diagnosis not present

## 2013-06-10 ENCOUNTER — Telehealth: Payer: Self-pay | Admitting: Cardiology

## 2013-06-10 ENCOUNTER — Non-Acute Institutional Stay (SKILLED_NURSING_FACILITY): Payer: Medicare Other | Admitting: Nurse Practitioner

## 2013-06-10 DIAGNOSIS — R4182 Altered mental status, unspecified: Secondary | ICD-10-CM | POA: Diagnosis not present

## 2013-06-10 DIAGNOSIS — F329 Major depressive disorder, single episode, unspecified: Secondary | ICD-10-CM

## 2013-06-10 DIAGNOSIS — M6281 Muscle weakness (generalized): Secondary | ICD-10-CM | POA: Diagnosis not present

## 2013-06-10 DIAGNOSIS — R259 Unspecified abnormal involuntary movements: Secondary | ICD-10-CM

## 2013-06-10 DIAGNOSIS — I4891 Unspecified atrial fibrillation: Secondary | ICD-10-CM | POA: Diagnosis not present

## 2013-06-10 DIAGNOSIS — R279 Unspecified lack of coordination: Secondary | ICD-10-CM | POA: Diagnosis not present

## 2013-06-10 DIAGNOSIS — N390 Urinary tract infection, site not specified: Secondary | ICD-10-CM | POA: Diagnosis not present

## 2013-06-10 DIAGNOSIS — E876 Hypokalemia: Secondary | ICD-10-CM

## 2013-06-10 DIAGNOSIS — R251 Tremor, unspecified: Secondary | ICD-10-CM

## 2013-06-10 DIAGNOSIS — G9340 Encephalopathy, unspecified: Secondary | ICD-10-CM

## 2013-06-10 DIAGNOSIS — I10 Essential (primary) hypertension: Secondary | ICD-10-CM | POA: Diagnosis not present

## 2013-06-10 DIAGNOSIS — R269 Unspecified abnormalities of gait and mobility: Secondary | ICD-10-CM | POA: Diagnosis not present

## 2013-06-10 DIAGNOSIS — G4700 Insomnia, unspecified: Secondary | ICD-10-CM | POA: Diagnosis not present

## 2013-06-10 DIAGNOSIS — Z8711 Personal history of peptic ulcer disease: Secondary | ICD-10-CM

## 2013-06-10 NOTE — Progress Notes (Signed)
Patient ID: [name], female   DOB: May 15, 1933, 79 y.o.   MRN: [id number] Code Status: DNR  Allergies  Allergen Reactions  . Mirtazapine     tremor  . Nickel Other (See Comments)    inflammation  . Other     Prednisone causes sleep disturbances    Chief Complaint  Patient presents with  . Medical Managment of Chronic Issues    insomnia, Afib, Fever, confusion.   Marland Kitchen Hospitalization Follow-up    HPI: Patient is a 79 y.o. female seen in the SNF at [facility] today for hospital f/u for fever, chills,   confusion,  and other chronic medical conditions. She has History of afib on anticoagulation, HTN, tremors since 09/2012, gait abnormality, falls, hx of ETOH dependence. The patient presented to ED with  confused speech which was incoherent. The patient's daughter stated the patient has not had any chest pain, no shortness of breath, no nausea, no vomiting, no abdominal pain, no diarrhea, no constipation no dysuria. The patient was 2-3 days after her completion of 8 day course of Cipro for UTI when she presented to ED.  Patient had decreased oral intake. Patient's daughter also states the patient has had some tremors since general this year was seen by a neurologist in the past was a dose nothing else that can be done. In hospital  CXR, CT head MRI brain were unremarkable. Sodium 132, EKG showed age of fibrillation. CBC was within normal limits however had a left shift. Due to patient being on chronic anticoagulation and lumbar puncture could not be done. Acute encephalopathy/ Prob CNS infection/Fever  Neurology consultation was also obtained and patient seen by Dr. Cyril Mourning of neurology. ID consultation was obtained for antibiotic regimen to continue IV vancomycin and Rocephin for 4 more days to complete a one-week course of antibiotic therapy. Patient continued to improve clinically.  During the hospitalization patient did have a bout of confusion which was thought to be secondary to sundowning  versus withdrawal from Ambien versus steroid psychosis. Steroids were discontinued. Patient's Ambien was resumed at 5 mg daily with resolution of her confusion. The patient requested to resume her Ambien 10mg  since she has been taking 10mg  for over 20 years.     Problem List Items Addressed This Visit   A-fib - Primary (Chronic)     Remained stable throughout the hospitalization. Patient was maintained on Lopressor and Cardizem for rate control. Eliquis for anticoagulation. The patient stated takes her  Flecainide on her own since her hospital discharge. HR 76 today. Will obtain EKG to evaluate. F/u  Cardiology for recommendation. Observe the patient.     Depression (Chronic)     Seems pleasant upon today's visit. Presently she is taking Lexapro 10mg .     Encephalopathy acute     Acute encephalopathy/ Prob CNS infection/Fever  Neurology consultation: Dr Leroy Kennedy 06/05/13 and  ID consultation  Dr Orvan Falconer 06/07/13  continue IV vancomycin and Rocephin for 4 more days to complete a one-week course of antibiotic therapy. PICC/ Midline 06/08/13. Update CBC, Cath UA C/S, TSH    Hx of peptic ulcer     Patient was maintained on Protonix throughout the hospitalization and was discharged on Protonix 40 mg daily.    Hypertension (Chronic)     Patient was maintained on cardizem and Lopressor and Losartan      Hypokalemia     Replete while in hospital--update CMP    Insomnia, unspecified     Patient's Ambien was held on  admission secondary to altered mental status. Once patient had improved significantly Ambien was resumed at half home dose of 5 mg daily. Patient be discharged on Ambien 5 mg daily as needed. The patient requested to resume her home dose of 10mg  today due to her poor night sleep while on 5mg .       Tremor (Chronic)     Questionable etiology. Likely secondary to dementia. B12 level is within normal limits. TSH within normal limits. RBC folate within normal limits. RPR was nonreactive. MRI  of the head with no acute abnormalities. Patient was seen by neurology it was felt that patient's bilateral tremor was likely secondary to a primary degenerative CNS disorder such as dementia and had worsened in the setting of infection. Was recommended that patient followup with her primary neurologist as outpatient.         Review of Systems:  Review of Systems  Constitutional: Positive for malaise/fatigue. Negative for fever, chills, weight loss and diaphoresis.       Generalized weakness has improved. She is dressed and out of bed.   HENT: Positive for hearing loss. Negative for ear pain, nosebleeds, congestion, sore throat, neck pain, tinnitus and ear discharge.   Eyes: Negative for blurred vision, double vision, photophobia, pain, discharge and redness.  Respiratory: Negative for cough, hemoptysis, shortness of breath and wheezing.   Cardiovascular: Positive for PND. Negative for chest pain, palpitations, orthopnea, claudication and leg swelling.  Gastrointestinal: Negative for heartburn, nausea, vomiting, abdominal pain, diarrhea, constipation and blood in stool.  Genitourinary: Negative for dysuria, urgency, frequency, hematuria and flank pain.  Musculoskeletal: Negative for myalgias, back pain, joint pain and falls.  Skin: Negative for itching and rash.       Right upper arm PICC  Neurological: Positive for weakness. Negative for dizziness, tingling, tremors, sensory change, speech change, focal weakness, seizures, loss of consciousness and headaches.  Endo/Heme/Allergies: Negative for environmental allergies and polydipsia. Bruises/bleeds easily.  Psychiatric/Behavioral: Positive for substance abuse. Negative for depression and hallucinations. The patient has insomnia. The patient is not nervous/anxious.        Alcohol dependence. She has a reasonable recollection of the recent events.      Past Medical History  Diagnosis Date  . A-fib   . Hypertension   . Arthritis   .  Cataracts, bilateral   . Other acariasis     peripherial neuropathy  . History of stomach ulcers   . GI bleed 06/2002  . UTI (lower urinary tract infection)     pt state uti w/ no pain  . PVC (premature ventricular contraction)   . Migraines   . Abdominal pain, unspecified site   . Diverticulosis of colon (without mention of hemorrhage)   . Hemorrhage of gastrointestinal tract, unspecified   . Unspecified hereditary and idiopathic peripheral neuropathy   . Osteoarthrosis, unspecified whether generalized or localized, lower leg   . Palpitations   . Depression   . Other and unspecified alcohol dependence, continuous drinking behavior 10/28/2012  . Nausea alone 10/28/2012  . Obesity, unspecified 10/27/2012  . Diverticulosis of colon (without mention of hemorrhage) 10/27/2012  . Insomnia, unspecified 10/27/2012  . Edema 10/27/2012  . Localized osteoarthrosis not specified whether primary or secondary, lower leg 05/31/2011  . Abnormality of gait 06/02/2013   Past Surgical History  Procedure Laterality Date  . Breast reduction surgery  12/23/2003  . Cholecystectomy  2005  . Cataracts    . Arthroscopic repair acl    . Tonsillectomy and adenoidectomy  12  . Breast biopsy  1973    left   Social History:   reports that she quit smoking about 29 years ago. Her smoking use included Cigarettes. She smoked 0.00 packs per day. She has never used smokeless tobacco. She reports that  drinks alcohol. She reports that she does not use illicit drugs.  Medications: Patient's Medications  New Prescriptions   No medications on file  Previous Medications   ACETAMINOPHEN (TYLENOL) 325 MG TABLET    Take 650 mg by mouth 2 (two) times daily as needed. For pain   APIXABAN (ELIQUIS) 5 MG TABS TABLET    Take 1 tablet (5 mg total) by mouth 2 (two) times daily.   CHOLECALCIFEROL (VITAMIN D) 2000 UNITS TABLET    Take 2,000 Units by mouth daily.   CHOLESTYRAMINE (QUESTRAN) 4 GM/DOSE POWDER    Take 4 grams 3 times daily  to help naussea.   DILTIAZEM (CARDIZEM CD) 180 MG 24 HR CAPSULE    Take 180 mg by mouth daily.   ESCITALOPRAM (LEXAPRO) 10 MG TABLET    Take 10 mg by mouth at bedtime.   FEEDING SUPPLEMENT (ENSURE COMPLETE) LIQD    Take 237 mLs by mouth 2 (two) times daily between meals.   LOSARTAN (COZAAR) 100 MG TABLET    Take 100 mg by mouth daily.   METOPROLOL SUCCINATE (TOPROL-XL) 50 MG 24 HR TABLET    Take 50 mg by mouth daily. Take with or immediately following a meal.   OMEGA-3 FATTY ACIDS (FISH OIL) 1200 MG CAPS    Take 1,200 mg by mouth daily.   PANTOPRAZOLE (PROTONIX) 40 MG TABLET    Take 1 tablet (40 mg total) by mouth daily at 6 (six) AM.   PROMETHAZINE (PHENERGAN) 25 MG TABLET    Take 25 mg by mouth daily as needed. For nausea and vomiting   THIAMINE 100 MG TABLET    Take 1 tablet (100 mg total) by mouth daily.   ZOLPIDEM (AMBIEN) 5 MG TABLET    Take 1 tablet (5 mg total) by mouth at bedtime as needed for sleep.  Modified Medications   No medications on file  Discontinued Medications   No medications on file     Physical Exam: Physical Exam  Constitutional: She is oriented to person, place, and time. She appears well-nourished. No distress.  HENT:  Head: Normocephalic and atraumatic.  Right Ear: External ear normal.  Left Ear: External ear normal.  Nose: Nose normal.  Eyes: Conjunctivae and EOM are normal. Pupils are equal, round, and reactive to light.  Prescription lenses.  Neck: Normal range of motion. Neck supple. No JVD present. No tracheal deviation present. No thyromegaly present.  Cardiovascular: Normal rate, regular rhythm, normal heart sounds and intact distal pulses.  Exam reveals no gallop and no friction rub.   No murmur heard. Pulmonary/Chest: Effort normal and breath sounds normal. No respiratory distress. She has no wheezes. She has no rales.  Abdominal: Soft. Bowel sounds are normal. She exhibits no distension and no mass. There is no tenderness.  Musculoskeletal:  Normal range of motion. She exhibits no edema and no tenderness.  Lymphadenopathy:    She has no cervical adenopathy.  Neurological: She is alert and oriented to person, place, and time. A cranial nerve deficit is present.  Significant tremor at rest. Benign essential tremor is the most likely diagnosis. She does not have rigidity. Glabellar response is normal.  Skin: Skin is warm and dry. No rash noted. No  erythema. No pallor.  PICC @ the right upper arm.   Psychiatric: She has a normal mood and affect. Her behavior is normal. Thought content normal.  Patient's affect is bright and conversant.     Filed Vitals:   06/13/13 0845  BP: 105/67  Pulse: 76  Temp: 97.2 F (36.2 C)  TempSrc: Tympanic  Resp: 16  SpO2: 96%      Labs reviewed: Basic Metabolic Panel:  Recent Labs  16/10/96 1122  10/09/12 0620 10/10/12 0640 06/05/13 0915 06/05/13 1330 06/06/13 0518 06/07/13 0355 06/08/13 0530  NA 137  --  135 134* 132*  --  131* 132* 136  K 3.6  --  3.5 4.0 3.8  --  3.1* 4.2 4.2  CL 102  --  99 97 95*  --  97 100 102  CO2  --   < > 24 23 23   --  22 21 25   GLUCOSE 102*  --  109* 109* 203*  --  194* 187* 132*  BUN 19  --  15 11 10   --  11 19 17   CREATININE 0.90  --  0.63 0.68 0.61  --  0.55 0.63 0.68  CALCIUM  --   < > 9.2 9.2 9.5  --  8.7 9.3 8.9  MG  --   --   --  1.7 1.8  --  1.9  --   --   TSH  --   --  2.024  --   --  1.215  --   --   --   < > = values in this interval not displayed. Liver Function Tests:  Recent Labs  06/05/13 0915  AST 17  ALT 13  ALKPHOS 66  BILITOT 0.6  PROT 7.1  ALBUMIN 4.1   CBC:  Recent Labs  10/09/12 0620 06/05/13 0915 06/06/13 0518 06/07/13 0355 06/08/13 0530  WBC 5.3 10.2 5.2 9.2 8.1  NEUTROABS  --  8.5*  --   --   --   HGB 13.7 14.0 13.0 13.0 11.7*  HCT 39.8 40.0 36.7 37.0 34.1*  MCV 93.4 87.0 85.7 86.9 87.9  PLT 178 391 324 321 271    Recent Labs  06/05/13 1330  VITAMINB12 443    Past Procedures:  06/05/2013 CHEST  2 VIEW COMPARISON: 10/08/2012 and earlier. IMPRESSION: No acute cardiopulmonary abnormality.   06/05/2013 CT HEAD WITHOUT CONTRAST IMPRESSION: Age appropriate involutional change. Inflammatory change right maxillary sinus age uncertain.   06/05/2013 MRI HEAD WITHOUT CONTRAST  IMPRESSION: 1. No evidence of acute intracranial disease. 2. Chronic small vessel ischemic injury.   06/06/2013 PORTABLE CHEST IMPRESSION: No acute findings   Assessment/Plan A-fib Remained stable throughout the hospitalization. Patient was maintained on Lopressor and Cardizem for rate control. Eliquis for anticoagulation. The patient stated takes her  Flecainide on her own since her hospital discharge. HR 76 today. Will obtain EKG to evaluate. F/u  Cardiology for recommendation. Observe the patient.   Hypertension Patient was maintained on cardizem and Lopressor and Losartan    Tremor Questionable etiology. Likely secondary to dementia. B12 level is within normal limits. TSH within normal limits. RBC folate within normal limits. RPR was nonreactive. MRI of the head with no acute abnormalities. Patient was seen by neurology it was felt that patient's bilateral tremor was likely secondary to a primary degenerative CNS disorder such as dementia and had worsened in the setting of infection. Was recommended that patient followup with her primary neurologist as outpatient.    Insomnia,  unspecified Patient's Ambien was held on admission secondary to altered mental status. Once patient had improved significantly Ambien was resumed at half home dose of 5 mg daily. Patient be discharged on Ambien 5 mg daily as needed. The patient requested to resume her home dose of 10mg  today due to her poor night sleep while on 5mg .     Hx of peptic ulcer Patient was maintained on Protonix throughout the hospitalization and was discharged on Protonix 40 mg daily.  Encephalopathy acute Acute encephalopathy/ Prob CNS infection/Fever  Neurology  consultation: Dr Leroy Kennedy 06/05/13 and  ID consultation  Dr Orvan Falconer 06/07/13  continue IV vancomycin and Rocephin for 4 more days to complete a one-week course of antibiotic therapy. PICC/ Midline 06/08/13. Update CBC, Cath UA C/S, TSH  Depression Seems pleasant upon today's visit. Presently she is taking Lexapro 10mg .   Hypokalemia Replete while in hospital--update CMP    Family/ Staff Communication: observe the patient.   Goals of Care: IL  Labs/tests ordered: CBC, CMP, TSH, EKG, Cath UA and C/S, CXR(the patient declined)

## 2013-06-10 NOTE — Telephone Encounter (Signed)
Returned call to [facility], patient recently in hospital and needs a sooner appointment.Appointment scheduled with Dr.Jordan 06/23/13.

## 2013-06-10 NOTE — Telephone Encounter (Signed)
New problem  Cheryl,This patient needs an appointment for November,call [facility] at [phone] and ask for her nurse.  Thanks

## 2013-06-11 ENCOUNTER — Inpatient Hospital Stay: Admission: RE | Admit: 2013-06-11 | Payer: Medicare Other | Source: Ambulatory Visit

## 2013-06-11 DIAGNOSIS — N390 Urinary tract infection, site not specified: Secondary | ICD-10-CM | POA: Diagnosis not present

## 2013-06-11 DIAGNOSIS — R259 Unspecified abnormal involuntary movements: Secondary | ICD-10-CM | POA: Diagnosis not present

## 2013-06-11 DIAGNOSIS — R4182 Altered mental status, unspecified: Secondary | ICD-10-CM | POA: Diagnosis not present

## 2013-06-11 DIAGNOSIS — R269 Unspecified abnormalities of gait and mobility: Secondary | ICD-10-CM | POA: Diagnosis not present

## 2013-06-11 DIAGNOSIS — D649 Anemia, unspecified: Secondary | ICD-10-CM | POA: Diagnosis not present

## 2013-06-11 DIAGNOSIS — R509 Fever, unspecified: Secondary | ICD-10-CM | POA: Diagnosis not present

## 2013-06-11 DIAGNOSIS — R279 Unspecified lack of coordination: Secondary | ICD-10-CM | POA: Diagnosis not present

## 2013-06-11 DIAGNOSIS — E039 Hypothyroidism, unspecified: Secondary | ICD-10-CM | POA: Diagnosis not present

## 2013-06-11 DIAGNOSIS — M6281 Muscle weakness (generalized): Secondary | ICD-10-CM | POA: Diagnosis not present

## 2013-06-11 DIAGNOSIS — I10 Essential (primary) hypertension: Secondary | ICD-10-CM | POA: Diagnosis not present

## 2013-06-11 LAB — CULTURE, BLOOD (ROUTINE X 2): Culture: NO GROWTH

## 2013-06-13 DIAGNOSIS — N390 Urinary tract infection, site not specified: Secondary | ICD-10-CM | POA: Diagnosis not present

## 2013-06-13 DIAGNOSIS — M6281 Muscle weakness (generalized): Secondary | ICD-10-CM | POA: Diagnosis not present

## 2013-06-13 DIAGNOSIS — R269 Unspecified abnormalities of gait and mobility: Secondary | ICD-10-CM | POA: Diagnosis not present

## 2013-06-13 DIAGNOSIS — R259 Unspecified abnormal involuntary movements: Secondary | ICD-10-CM | POA: Diagnosis not present

## 2013-06-13 DIAGNOSIS — Z8711 Personal history of peptic ulcer disease: Secondary | ICD-10-CM | POA: Insufficient documentation

## 2013-06-13 DIAGNOSIS — R4182 Altered mental status, unspecified: Secondary | ICD-10-CM | POA: Diagnosis not present

## 2013-06-13 DIAGNOSIS — R279 Unspecified lack of coordination: Secondary | ICD-10-CM | POA: Diagnosis not present

## 2013-06-13 NOTE — Assessment & Plan Note (Signed)
Patient's Ambien was held on admission secondary to altered mental status. Once patient had improved significantly Ambien was resumed at half home dose of 5 mg daily. Patient be discharged on Ambien 5 mg daily as needed. The patient requested to resume her home dose of 10mg  today due to her poor night sleep while on 5mg .

## 2013-06-13 NOTE — Assessment & Plan Note (Signed)
Remained stable throughout the hospitalization. Patient was maintained on Lopressor and Cardizem for rate control. Eliquis for anticoagulation. The patient stated takes her  Flecainide on her own since her hospital discharge. HR 76 today. Will obtain EKG to evaluate. F/u  Cardiology for recommendation. Observe the patient.

## 2013-06-13 NOTE — Assessment & Plan Note (Addendum)
Acute encephalopathy/ Prob CNS infection/Fever  Neurology consultation: Dr Leroy Kennedy 06/05/13 and  ID consultation  Dr Orvan Falconer 06/07/13  continue IV vancomycin and Rocephin for 4 more days to complete a one-week course of antibiotic therapy. PICC/ Midline 06/08/13. Update CBC, Cath UA C/S, TSH

## 2013-06-13 NOTE — Assessment & Plan Note (Addendum)
Patient was maintained on cardizem and Lopressor and Losartan

## 2013-06-13 NOTE — Assessment & Plan Note (Signed)
Patient was maintained on Protonix throughout the hospitalization and was discharged on Protonix 40 mg daily.

## 2013-06-13 NOTE — Assessment & Plan Note (Signed)
Replete while in hospital--update CMP

## 2013-06-13 NOTE — Assessment & Plan Note (Signed)
Questionable etiology. Likely secondary to dementia. B12 level is within normal limits. TSH within normal limits. RBC folate within normal limits. RPR was nonreactive. MRI of the head with no acute abnormalities. Patient was seen by neurology it was felt that patient's bilateral tremor was likely secondary to a primary degenerative CNS disorder such as dementia and had worsened in the setting of infection. Was recommended that patient followup with her primary neurologist as outpatient.

## 2013-06-13 NOTE — Assessment & Plan Note (Signed)
Seems pleasant upon today's visit. Presently she is taking Lexapro 10mg .

## 2013-06-14 ENCOUNTER — Non-Acute Institutional Stay (SKILLED_NURSING_FACILITY): Payer: Medicare Other | Admitting: Geriatric Medicine

## 2013-06-14 DIAGNOSIS — R259 Unspecified abnormal involuntary movements: Secondary | ICD-10-CM | POA: Diagnosis not present

## 2013-06-14 DIAGNOSIS — N390 Urinary tract infection, site not specified: Secondary | ICD-10-CM | POA: Diagnosis not present

## 2013-06-14 DIAGNOSIS — G9340 Encephalopathy, unspecified: Secondary | ICD-10-CM

## 2013-06-14 DIAGNOSIS — I10 Essential (primary) hypertension: Secondary | ICD-10-CM

## 2013-06-14 DIAGNOSIS — G4700 Insomnia, unspecified: Secondary | ICD-10-CM

## 2013-06-14 DIAGNOSIS — R269 Unspecified abnormalities of gait and mobility: Secondary | ICD-10-CM

## 2013-06-14 DIAGNOSIS — R279 Unspecified lack of coordination: Secondary | ICD-10-CM | POA: Diagnosis not present

## 2013-06-14 DIAGNOSIS — I4891 Unspecified atrial fibrillation: Secondary | ICD-10-CM

## 2013-06-14 DIAGNOSIS — M6281 Muscle weakness (generalized): Secondary | ICD-10-CM | POA: Diagnosis not present

## 2013-06-14 DIAGNOSIS — R4182 Altered mental status, unspecified: Secondary | ICD-10-CM | POA: Diagnosis not present

## 2013-06-14 DIAGNOSIS — R251 Tremor, unspecified: Secondary | ICD-10-CM

## 2013-06-14 MED ORDER — ZOLPIDEM TARTRATE 5 MG PO TABS: 5.0000 mg | ORAL_TABLET | Freq: Every evening | ORAL | Status: DC | PRN

## 2013-06-14 NOTE — Progress Notes (Signed)
Patient ID: [name], female   DOB: May 20, 1933, 80 y.o.   MRN: [id number] [facility] [phone])  Code Status: DNR Contact Information   Name Relation Home Work [place] Relative [phone]  [phone]   Orland Jarred   [phone]      Chief Complaint  Patient presents with  . Discharge Note    Skilled rehab dischage    HPI: This 80 y.o. female resident of [facility] was admitted to hospital 06/05/2013. The patient presented to ED with  confused, incoherent speech, eventually diagnosed with acute encephalopathy, probabale CNS infection/Fever.  Neurology consultation was obtained with Dr. Cyril Mourning of neurology who felt patient's tremor and gait disturbance are due to progressive degenerative neurologic process. ID consultation was obtained for antibiotic regimen, recommended continue IV vancomycin and Rocephin  to complete a one-week course of antibiotic therapy. Patient continued to improve clinically.  During the hospitalization patient did have a bout of confusion which was thought to be secondary to sundowning versus withdrawal from Ambien versus steroid psychosis. Steroids were discontinued. Patient's Ambien was resumed at 5 mg daily with resolution of her confusion.  Patient's general condition has improved dramatically; she is anxious to go home.  Repeat lab 9/19 was satisfactory. IV antibiotic course completed 2 days ago, No recurrent fever or confusion. Appetite is remarkably improved, gait is also improved though balance remains a problem. Heart rate/ BP have remained well controlled. Lexapro was started during hospitalization, unclear reason. This medication was stopped earlier this year due to concern re: tremor.  Patient has continued Ambien at reduced dose 95mg ), is sleeping "OK".     Allergies  Allergen Reactions  . Mirtazapine     tremor  . Nickel Other (See Comments)     inflammation  . Other     Prednisone causes sleep disturbances   Medications    Medication List       This list is accurate as of: 06/14/13  5:35 PM.  Always use your most recent med list.               acetaminophen 325 MG tablet  Commonly known as:  TYLENOL  Take 650 mg by mouth 2 (two) times daily as needed. For pain     apixaban 5 MG Tabs tablet  Commonly known as:  ELIQUIS  Take 1 tablet (5 mg total) by mouth 2 (two) times daily.     cholestyramine 4 GM/DOSE powder  Commonly known as:  QUESTRAN  Take 4 grams 3 times daily to help naussea.     diltiazem 180 MG 24 hr capsule  Commonly known as:  CARDIZEM CD  Take 180 mg by mouth daily.     escitalopram 10 MG tablet  Commonly known as:  LEXAPRO  Take 10 mg by mouth at bedtime. Take  Tablet every other evening for 2 weeks then stop     feeding supplement Liqd  Take 237 mLs by mouth 2 (two) times daily between meals.     Fish Oil 1200 MG Caps  Take 1,200 mg by mouth daily.     flecainide 50 MG tablet  Commonly known as:  TAMBOCOR  Take 50 mg by mouth 2 (two) times daily.     losartan 100 MG tablet  Commonly known as:  COZAAR  Take 100 mg by mouth daily.     metoprolol succinate 50 MG 24 hr tablet  Commonly known as:  TOPROL-XL  Take  50 mg by mouth daily. Take with or immediately following a meal.     pantoprazole 40 MG tablet  Commonly known as:  PROTONIX  Take 40 mg by mouth daily at 6 (six) AM. Take 1 every other day for 1 week then stop     promethazine 25 MG tablet  Commonly known as:  PHENERGAN  Take 25 mg by mouth daily as needed. For nausea and vomiting     thiamine 100 MG tablet  Take 1 tablet (100 mg total) by mouth daily.     Vitamin D 2000 UNITS tablet  Take 2,000 Units by mouth daily.     zolpidem 5 MG tablet  Commonly known as:  AMBIEN  Take 1 tablet (5 mg total) by mouth at bedtime as needed for sleep.        DATA REVIEWED  Radiologic Exams:    06/05/2013: CT head, MRI brain,  CXR results reviewed  Cardiovascular Exams:   Laboratory Studies:  Hospital lab Lab Results  Component Value Date   WBC 8.1 06/08/2013   HGB 11.7* 06/08/2013   HCT 34.1* 06/08/2013   PLT 271 06/08/2013   GLUCOSE 132* 06/08/2013   ALT 13 06/05/2013   AST 17 06/05/2013   NA 136 06/08/2013   K 4.2 06/08/2013   CL 102 06/08/2013   CREATININE 0.68 06/08/2013   BUN 17 06/08/2013   CO2 25 06/08/2013   TSH 1.215 06/05/2013   INR 1.30 06/05/2013   HGBA1C 5.2 06/08/2013    [facility] 06/11/2013 WBC 7.6, Hgb 11.7, Hct 33.9. PLt 260  Glu 100, BUN9, cr. .65, Na 135, K+ 3.7. Alb 3.3  TSH 2.19      Review of Systems  DATA OBTAINED: from patient, nurse, medical record,  GENERAL: Feels well "...best I have felt in over 1 year!"  No fevers, fatigue. Improved appetite  SKIN: No itch, rash or open wounds EYES: No eye pain, dryness or itching  No change in vision EARS: No earache, tinnitus, change in hearing NOSE: No congestion, drainage or bleeding MOUTH/THROAT: No mouth or tooth pain  No sore throat No difficulty chewing or swallowing RESPIRATORY: No cough, wheezing, SOB CARDIAC: No chest pain, palpitations  No edema. GI: No abdominal pain  No N/V/D or constipation  No heartburn or reflux  GU: No dysuria.  Incontinence persists No change in urine volume or character    MUSCULOSKELETAL: No joint pain, swelling or stiffness  No back pain  No muscle ache, pain, weakness  Gait is unsteady      NEUROLOGIC: No dizziness, fainting, headache. Both feet cold/tingly "peripheral neuropathy". No recent confusion .  PSYCHIATRIC: No feelings of anxiety, depression Sleeps well.       Physical Exam Filed Vitals:   06/14/13 1356  BP: 139/74  Pulse: 63  Resp: 11  SpO2: 97%   GENERAL APPEARANCE: No acute distress, appropriately groomed, normal body habitus. Alert, pleasant, conversant. SKIN: No diaphoresis, rash, unusual lesions. Multiple ecchymotic areas on both arms. PICC line site satisfactory. Sutures cut,  PICC line removed easily, intact.  HEAD: Normocephalic, atraumatic EYES: Conjunctiva/lids clear. Pupils round, reactive.  EARS: External exam WNL. Hearing grossly normal. NOSE: No deformity or discharge. MOUTH/THROAT: Lips w/o lesions. Oral mucosa, tongue moist, w/o lesion. Oropharynx w/o redness or lesions.  NECK: Supple, full ROM. No thyroid tenderness, enlargement or nodule LYMPHATICS: No head, neck or supraclavicular adenopathy RESPIRATORY: Breathing is even, unlabored. Lung sounds are clear and full.  CARDIOVASCULAR: Heart RRR. No murmur or  extra heart sounds  ARTERIAL: No carotid bruit. Carotid DP,pulse 2+.  VENOUS: No varicosities. No venous stasis skin changes  EDEMA: No peripheral or periorbital edema GASTROINTESTINAL: Abdomen is soft, non-tender, not distended w/ normal bowel sounds. No hepatic or splenic enlargement. No mass, ventral or inguinal hernia. GENITOURINARY: Bladder non tender, not distended. MUSCULOSKELETAL: Moves all extremities with full ROM, strength and tone. Back is without kyphosis, scoliosis or spinal process tenderness. Pt. transfers self easily from bed to walker, Gait is steady w/ walker, tends to fall backward w/o it NEUROLOGIC: Oriented to time, place, person. Cranial nerves 2-12 grossly intact, speech clear. Mild resting tremor left hand, no intention tremor PSYCHIATRIC: Mood and affect appropriate to situation  ASSESSMENT/PLAN  Patient reports feeling better than she has in the past year. Will discharge to her independent living home at [facility] later today once 24-hour caregivers have been arranged. Patient will keep currently scheduled appointments with Dr. [place] on October 1 for cardiology followup, and October 20 with Dr. Chilton Si for general primary care followup.  Encephalopathy acute Exact etiology remains unclear, patient has completed a course of IV antibiotics. No sign of encephalopathy present today. PICC line removed without  difficulty.  A-fib Heart remains in regular rhythm, rate is well controlled. Continue current medications and anticoagulation.  Hypertension Blood pressure remains well controlled on current medications. Recent lab satisfactory  Tremor Bilateral hand tremor is nearly resolved all that, although remains is a mild resting tremor of her left hand. Changes for this patient include no alcohol, and reduction in Ambien dose.  Abnormality of gait Patient's overall posture and gait is dramatically improved since last visit. She does continue to have problems with balance, specifically she tends to fall backwards. She is ambulating safely today with a rolling walker. Strongly recommend home physical therapy for further improvement with lower extremity strength gait and balance.  Insomnia, unspecified Ambien dose was stopped briefly during hospitalization, restarted at half her usual dose. The patient has been able to sleep well on 5 mg dose of Ambien.     Follow up: As scheduled  Natia Fahmy T.Clester Chlebowski, NP-C 06/14/2013

## 2013-06-15 DIAGNOSIS — N390 Urinary tract infection, site not specified: Secondary | ICD-10-CM | POA: Diagnosis not present

## 2013-06-15 DIAGNOSIS — R259 Unspecified abnormal involuntary movements: Secondary | ICD-10-CM | POA: Diagnosis not present

## 2013-06-15 DIAGNOSIS — R4182 Altered mental status, unspecified: Secondary | ICD-10-CM | POA: Diagnosis not present

## 2013-06-15 DIAGNOSIS — M6281 Muscle weakness (generalized): Secondary | ICD-10-CM | POA: Diagnosis not present

## 2013-06-15 DIAGNOSIS — R269 Unspecified abnormalities of gait and mobility: Secondary | ICD-10-CM | POA: Diagnosis not present

## 2013-06-15 DIAGNOSIS — R279 Unspecified lack of coordination: Secondary | ICD-10-CM | POA: Diagnosis not present

## 2013-06-16 DIAGNOSIS — R4182 Altered mental status, unspecified: Secondary | ICD-10-CM | POA: Diagnosis not present

## 2013-06-16 DIAGNOSIS — R259 Unspecified abnormal involuntary movements: Secondary | ICD-10-CM | POA: Diagnosis not present

## 2013-06-16 DIAGNOSIS — R269 Unspecified abnormalities of gait and mobility: Secondary | ICD-10-CM | POA: Diagnosis not present

## 2013-06-16 DIAGNOSIS — N390 Urinary tract infection, site not specified: Secondary | ICD-10-CM | POA: Diagnosis not present

## 2013-06-16 DIAGNOSIS — R279 Unspecified lack of coordination: Secondary | ICD-10-CM | POA: Diagnosis not present

## 2013-06-16 DIAGNOSIS — M6281 Muscle weakness (generalized): Secondary | ICD-10-CM | POA: Diagnosis not present

## 2013-06-17 ENCOUNTER — Encounter: Payer: Self-pay | Admitting: Geriatric Medicine

## 2013-06-17 DIAGNOSIS — R4182 Altered mental status, unspecified: Secondary | ICD-10-CM | POA: Diagnosis not present

## 2013-06-17 DIAGNOSIS — M6281 Muscle weakness (generalized): Secondary | ICD-10-CM | POA: Diagnosis not present

## 2013-06-17 DIAGNOSIS — N390 Urinary tract infection, site not specified: Secondary | ICD-10-CM | POA: Diagnosis not present

## 2013-06-17 DIAGNOSIS — R259 Unspecified abnormal involuntary movements: Secondary | ICD-10-CM | POA: Diagnosis not present

## 2013-06-17 DIAGNOSIS — R269 Unspecified abnormalities of gait and mobility: Secondary | ICD-10-CM | POA: Diagnosis not present

## 2013-06-17 DIAGNOSIS — R279 Unspecified lack of coordination: Secondary | ICD-10-CM | POA: Diagnosis not present

## 2013-06-17 NOTE — Assessment & Plan Note (Signed)
Ambien dose was stopped briefly during hospitalization, restarted at half her usual dose. The patient has been able to sleep well on 5 mg dose of Ambien.

## 2013-06-17 NOTE — Assessment & Plan Note (Signed)
Patient's overall posture and gait is dramatically improved since last visit. She does continue to have problems with balance, specifically she tends to fall backwards. She is ambulating safely today with a rolling walker. Strongly recommend home physical therapy for further improvement with lower extremity strength gait and balance.

## 2013-06-17 NOTE — Assessment & Plan Note (Signed)
Bilateral hand tremor is nearly resolved all that, although remains is a mild resting tremor of her left hand. Changes for this patient include no alcohol, and reduction in Ambien dose.

## 2013-06-17 NOTE — Assessment & Plan Note (Signed)
Heart remains in regular rhythm, rate is well controlled. Continue current medications and anticoagulation.

## 2013-06-17 NOTE — Assessment & Plan Note (Signed)
Blood pressure remains well controlled on current medications. Recent lab satisfactory

## 2013-06-17 NOTE — Assessment & Plan Note (Addendum)
Exact etiology remains unclear, patient has completed a course of IV antibiotics. No sign of encephalopathy present today. PICC line removed without difficulty.

## 2013-06-18 DIAGNOSIS — N390 Urinary tract infection, site not specified: Secondary | ICD-10-CM | POA: Diagnosis not present

## 2013-06-18 DIAGNOSIS — M6281 Muscle weakness (generalized): Secondary | ICD-10-CM | POA: Diagnosis not present

## 2013-06-18 DIAGNOSIS — R4182 Altered mental status, unspecified: Secondary | ICD-10-CM | POA: Diagnosis not present

## 2013-06-18 DIAGNOSIS — R279 Unspecified lack of coordination: Secondary | ICD-10-CM | POA: Diagnosis not present

## 2013-06-18 DIAGNOSIS — R269 Unspecified abnormalities of gait and mobility: Secondary | ICD-10-CM | POA: Diagnosis not present

## 2013-06-18 DIAGNOSIS — R259 Unspecified abnormal involuntary movements: Secondary | ICD-10-CM | POA: Diagnosis not present

## 2013-06-21 DIAGNOSIS — R279 Unspecified lack of coordination: Secondary | ICD-10-CM | POA: Diagnosis not present

## 2013-06-21 DIAGNOSIS — R269 Unspecified abnormalities of gait and mobility: Secondary | ICD-10-CM | POA: Diagnosis not present

## 2013-06-21 DIAGNOSIS — M6281 Muscle weakness (generalized): Secondary | ICD-10-CM | POA: Diagnosis not present

## 2013-06-21 DIAGNOSIS — N390 Urinary tract infection, site not specified: Secondary | ICD-10-CM | POA: Diagnosis not present

## 2013-06-21 DIAGNOSIS — R4182 Altered mental status, unspecified: Secondary | ICD-10-CM | POA: Diagnosis not present

## 2013-06-21 DIAGNOSIS — R259 Unspecified abnormal involuntary movements: Secondary | ICD-10-CM | POA: Diagnosis not present

## 2013-06-22 DIAGNOSIS — R279 Unspecified lack of coordination: Secondary | ICD-10-CM | POA: Diagnosis not present

## 2013-06-22 DIAGNOSIS — R269 Unspecified abnormalities of gait and mobility: Secondary | ICD-10-CM | POA: Diagnosis not present

## 2013-06-22 DIAGNOSIS — M6281 Muscle weakness (generalized): Secondary | ICD-10-CM | POA: Diagnosis not present

## 2013-06-22 DIAGNOSIS — N390 Urinary tract infection, site not specified: Secondary | ICD-10-CM | POA: Diagnosis not present

## 2013-06-22 DIAGNOSIS — R259 Unspecified abnormal involuntary movements: Secondary | ICD-10-CM | POA: Diagnosis not present

## 2013-06-22 DIAGNOSIS — R4182 Altered mental status, unspecified: Secondary | ICD-10-CM | POA: Diagnosis not present

## 2013-06-23 ENCOUNTER — Ambulatory Visit (INDEPENDENT_AMBULATORY_CARE_PROVIDER_SITE_OTHER): Payer: Medicare Other | Admitting: Cardiology

## 2013-06-23 ENCOUNTER — Encounter: Payer: Self-pay | Admitting: Cardiology

## 2013-06-23 ENCOUNTER — Encounter: Payer: Self-pay | Admitting: Internal Medicine

## 2013-06-23 VITALS — BP 121/70 | HR 69 | Ht 67.5 in

## 2013-06-23 DIAGNOSIS — I10 Essential (primary) hypertension: Secondary | ICD-10-CM

## 2013-06-23 DIAGNOSIS — I4891 Unspecified atrial fibrillation: Secondary | ICD-10-CM | POA: Diagnosis not present

## 2013-06-23 NOTE — Patient Instructions (Signed)
Continue your current therapy  I will see you again in 6 months.   

## 2013-06-23 NOTE — Progress Notes (Signed)
[name] Date of Birth: 12-Jun-1933 Medical Record #[id number]  History of Present Illness: [name] is seen for follow up of atrial fibrillation. She has been managed with flecainide, metoprolol, and diltiazem. She is also anticoagulated with Eliquis. She has done very well from a cardiac standpoint without any recurrent symptoms of atrial fibrillation. She was admitted in early September with acute encephalopathy and fever. She apparently responded very well to antibiotics. She states she is back to her baseline now.  Current Outpatient Prescriptions on File Prior to Visit  Medication Sig Dispense Refill  . acetaminophen (TYLENOL) 325 MG tablet Take 650 mg by mouth 2 (two) times daily as needed. For pain      . apixaban (ELIQUIS) 5 MG TABS tablet Take 1 tablet (5 mg total) by mouth 2 (two) times daily.  180 tablet  3  . cholestyramine (QUESTRAN) 4 GM/DOSE powder Take 4 grams 3 times daily to help naussea.  378 g  12  . diltiazem (CARDIZEM CD) 180 MG 24 hr capsule Take 180 mg by mouth daily.      . feeding supplement (ENSURE COMPLETE) LIQD Take 237 mLs by mouth 2 (two) times daily between meals.      . flecainide (TAMBOCOR) 50 MG tablet Take 50 mg by mouth 2 (two) times daily.       Marland Kitchen losartan (COZAAR) 100 MG tablet Take 100 mg by mouth daily.      . metoprolol succinate (TOPROL-XL) 50 MG 24 hr tablet Take 50 mg by mouth daily. Take with or immediately following a meal.      . Omega-3 Fatty Acids (FISH OIL) 1200 MG CAPS Take 1,200 mg by mouth daily.      . promethazine (PHENERGAN) 25 MG tablet Take 25 mg by mouth daily as needed. For nausea and vomiting      . zolpidem (AMBIEN) 5 MG tablet Take 1 tablet (5 mg total) by mouth at bedtime as needed for sleep.  30 tablet  5   No current facility-administered medications on file prior to visit.    Allergies  Allergen Reactions  . Mirtazapine     tremor  . Nickel Other (See Comments)    inflammation  . Other     Prednisone causes  sleep disturbances    Past Medical History  Diagnosis Date  . A-fib   . Hypertension   . Arthritis   . Cataracts, bilateral   . Other acariasis     peripherial neuropathy  . History of stomach ulcers   . GI bleed 06/2002  . UTI (lower urinary tract infection)     pt state uti w/ no pain  . PVC (premature ventricular contraction)   . Migraines   . Abdominal pain, unspecified site   . Diverticulosis of colon (without mention of hemorrhage)   . Hemorrhage of gastrointestinal tract, unspecified   . Unspecified hereditary and idiopathic peripheral neuropathy   . Osteoarthrosis, unspecified whether generalized or localized, lower leg   . Palpitations   . Depression   . Other and unspecified alcohol dependence, continuous drinking behavior 10/28/2012  . Nausea alone 10/28/2012  . Obesity, unspecified 10/27/2012  . Diverticulosis of colon (without mention of hemorrhage) 10/27/2012  . Insomnia, unspecified 10/27/2012  . Edema 10/27/2012  . Localized osteoarthrosis not specified whether primary or secondary, lower leg 05/31/2011  . Abnormality of gait 06/02/2013    Past Surgical History  Procedure Laterality Date  . Breast reduction surgery  12/23/2003  . Cholecystectomy  2005  . Cataracts    . Arthroscopic repair acl    . Tonsillectomy and adenoidectomy  1941  . Breast biopsy  1973    left    History  Smoking status  . Former Smoker  . Types: Cigarettes  . Quit date: 10/09/1983  Smokeless tobacco  . Never Used    History  Alcohol Use  . 0.0 oz/week  . 1-2 Shots of liquor per week    Comment: daily  scotch     History reviewed. No pertinent family history.  Review of Systems: As noted in history of present illness All other systems were reviewed and are negative.  Physical Exam: BP 121/70  Pulse 69  Ht 5' 7.5" (1.715 m) She is a pleasant, elderly white female in no acute distress. The patient is alert and oriented x 3.    The skin is warm and dry.    The HEENT exam is  normal. There is no JVD.  The lungs are clear.    The heart exam reveals a regular rate with a normal S1 and S2.  There is a grade 1-2/6 diastolic murmur the right upper sternal border.  The PMI is not displaced.   Abdominal exam reveals good bowel sounds.  There is no guarding or rebound.  There is no hepatosplenomegaly or tenderness.  There are no masses.  Exam of the legs reveal no clubbing, cyanosis, or edema.  The legs are without rashes.  The distal pulses are intact.  Cranial nerves II - XII are intact.  Motor and sensory functions are intact.  The gait is normal.  LABORATORY DATA: ECG was reviewed on 06/05/2013. This was a difficult study to interpret due to to significant baseline artifact. Rate was 90 beats per minute there were no acute ST or T wave changes. I think this is probably sinus rhythm.  Lab Results  Component Value Date   WBC 8.1 06/08/2013   HGB 11.7* 06/08/2013   HCT 34.1* 06/08/2013   PLT 271 06/08/2013   GLUCOSE 132* 06/08/2013   ALT 13 06/05/2013   AST 17 06/05/2013   NA 136 06/08/2013   K 4.2 06/08/2013   CL 102 06/08/2013   CREATININE 0.68 06/08/2013   BUN 17 06/08/2013   CO2 25 06/08/2013   TSH 1.215 06/05/2013   INR 1.30 06/05/2013   HGBA1C 5.2 06/08/2013   Assessment / Plan: 1. Atrial fibrillation- maintaining NSR on flecainide. Given multiple recurrences will continue 50 mg bid for maintenance. Continue diltiazem and low-dose metoprolol.  2. Hypertension, controlled.   3. Acute encephalopathy with fever. Etiology is unclear. Patient has responded well to antibiotic therapy.

## 2013-06-25 DIAGNOSIS — R269 Unspecified abnormalities of gait and mobility: Secondary | ICD-10-CM | POA: Diagnosis not present

## 2013-06-25 DIAGNOSIS — R279 Unspecified lack of coordination: Secondary | ICD-10-CM | POA: Diagnosis not present

## 2013-06-25 DIAGNOSIS — M6281 Muscle weakness (generalized): Secondary | ICD-10-CM | POA: Diagnosis not present

## 2013-06-25 DIAGNOSIS — R4182 Altered mental status, unspecified: Secondary | ICD-10-CM | POA: Diagnosis not present

## 2013-06-28 ENCOUNTER — Encounter: Payer: Self-pay | Admitting: Internal Medicine

## 2013-06-28 DIAGNOSIS — R4182 Altered mental status, unspecified: Secondary | ICD-10-CM | POA: Diagnosis not present

## 2013-06-28 DIAGNOSIS — R269 Unspecified abnormalities of gait and mobility: Secondary | ICD-10-CM | POA: Diagnosis not present

## 2013-06-28 DIAGNOSIS — M6281 Muscle weakness (generalized): Secondary | ICD-10-CM | POA: Diagnosis not present

## 2013-06-28 DIAGNOSIS — R279 Unspecified lack of coordination: Secondary | ICD-10-CM | POA: Diagnosis not present

## 2013-06-30 ENCOUNTER — Ambulatory Visit: Payer: Medicare Other | Admitting: Cardiology

## 2013-06-30 DIAGNOSIS — R4182 Altered mental status, unspecified: Secondary | ICD-10-CM | POA: Diagnosis not present

## 2013-06-30 DIAGNOSIS — M6281 Muscle weakness (generalized): Secondary | ICD-10-CM | POA: Diagnosis not present

## 2013-06-30 DIAGNOSIS — R279 Unspecified lack of coordination: Secondary | ICD-10-CM | POA: Diagnosis not present

## 2013-06-30 DIAGNOSIS — R269 Unspecified abnormalities of gait and mobility: Secondary | ICD-10-CM | POA: Diagnosis not present

## 2013-07-02 DIAGNOSIS — M6281 Muscle weakness (generalized): Secondary | ICD-10-CM | POA: Diagnosis not present

## 2013-07-02 DIAGNOSIS — R269 Unspecified abnormalities of gait and mobility: Secondary | ICD-10-CM | POA: Diagnosis not present

## 2013-07-02 DIAGNOSIS — R279 Unspecified lack of coordination: Secondary | ICD-10-CM | POA: Diagnosis not present

## 2013-07-02 DIAGNOSIS — R4182 Altered mental status, unspecified: Secondary | ICD-10-CM | POA: Diagnosis not present

## 2013-07-06 DIAGNOSIS — R269 Unspecified abnormalities of gait and mobility: Secondary | ICD-10-CM | POA: Diagnosis not present

## 2013-07-06 DIAGNOSIS — R279 Unspecified lack of coordination: Secondary | ICD-10-CM | POA: Diagnosis not present

## 2013-07-06 DIAGNOSIS — M6281 Muscle weakness (generalized): Secondary | ICD-10-CM | POA: Diagnosis not present

## 2013-07-06 DIAGNOSIS — R4182 Altered mental status, unspecified: Secondary | ICD-10-CM | POA: Diagnosis not present

## 2013-07-08 DIAGNOSIS — R279 Unspecified lack of coordination: Secondary | ICD-10-CM | POA: Diagnosis not present

## 2013-07-08 DIAGNOSIS — M6281 Muscle weakness (generalized): Secondary | ICD-10-CM | POA: Diagnosis not present

## 2013-07-08 DIAGNOSIS — R4182 Altered mental status, unspecified: Secondary | ICD-10-CM | POA: Diagnosis not present

## 2013-07-08 DIAGNOSIS — R269 Unspecified abnormalities of gait and mobility: Secondary | ICD-10-CM | POA: Diagnosis not present

## 2013-07-12 ENCOUNTER — Encounter: Payer: Self-pay | Admitting: Internal Medicine

## 2013-07-12 ENCOUNTER — Non-Acute Institutional Stay: Payer: Medicare Other | Admitting: Internal Medicine

## 2013-07-12 VITALS — BP 132/78 | HR 76 | Ht 67.0 in | Wt 145.6 lb

## 2013-07-12 DIAGNOSIS — I4891 Unspecified atrial fibrillation: Secondary | ICD-10-CM

## 2013-07-12 DIAGNOSIS — R05 Cough: Secondary | ICD-10-CM

## 2013-07-12 DIAGNOSIS — R634 Abnormal weight loss: Secondary | ICD-10-CM

## 2013-07-12 DIAGNOSIS — R259 Unspecified abnormal involuntary movements: Secondary | ICD-10-CM

## 2013-07-12 DIAGNOSIS — R7309 Other abnormal glucose: Secondary | ICD-10-CM

## 2013-07-12 DIAGNOSIS — F1020 Alcohol dependence, uncomplicated: Secondary | ICD-10-CM

## 2013-07-12 DIAGNOSIS — R739 Hyperglycemia, unspecified: Secondary | ICD-10-CM

## 2013-07-12 DIAGNOSIS — R251 Tremor, unspecified: Secondary | ICD-10-CM

## 2013-07-12 DIAGNOSIS — R110 Nausea: Secondary | ICD-10-CM

## 2013-07-12 DIAGNOSIS — I10 Essential (primary) hypertension: Secondary | ICD-10-CM | POA: Diagnosis not present

## 2013-07-12 NOTE — Patient Instructions (Signed)
Continue current medication.

## 2013-07-12 NOTE — Progress Notes (Signed)
Subjective:    Patient ID: [name], female    DOB: [birth year], 79 y.o.   MRN: [id number]  Chief Complaint  Patient presents with  . Medical Managment of Chronic Issues    Blood pressure, A-Fib, Depression, and weight loss     HPI Hypertension: Improved  Unintentional weight loss: Continues to have some weight loss despite an improving appetite. She is down another 3 pounds since mid September 2014.  Tremor: Resolved  A-fib: Right controlled  Nausea alone: Resolved  Cough: Resolved  Hyperglycemia: Improved  Other and unspecified alcohol dependence, continuous drinking behavior: Patient has almost completely stopped drinking alcohol    Current Outpatient Prescriptions on File Prior to Visit  Medication Sig Dispense Refill  . acetaminophen (TYLENOL) 325 MG tablet Take 650 mg by mouth 2 (two) times daily as needed. For pain      . apixaban (ELIQUIS) 5 MG TABS tablet Take 1 tablet (5 mg total) by mouth 2 (two) times daily.  180 tablet  3  . cholestyramine (QUESTRAN) 4 GM/DOSE powder Take 4 grams 3 times daily to help naussea.  378 g  12  . diltiazem (CARDIZEM CD) 180 MG 24 hr capsule Take 180 mg by mouth daily.      . feeding supplement (ENSURE COMPLETE) LIQD Take 237 mLs by mouth 2 (two) times daily between meals.      . flecainide (TAMBOCOR) 50 MG tablet Take 50 mg by mouth 2 (two) times daily.       Marland Kitchen losartan (COZAAR) 100 MG tablet Take 100 mg by mouth daily.      . metoprolol succinate (TOPROL-XL) 50 MG 24 hr tablet Take 50 mg by mouth daily. Take with or immediately following a meal.      . promethazine (PHENERGAN) 25 MG tablet Take 25 mg by mouth daily as needed. For nausea and vomiting      . zolpidem (AMBIEN) 5 MG tablet Take 1 tablet (5 mg total) by mouth at bedtime as needed for sleep.  30 tablet  5   No current facility-administered medications on file prior to visit.    Review of Systems  Constitutional: Negative for fever, chills, activity change and  appetite change.       Continues to lose weight.  HENT: Positive for hearing loss. Negative for congestion and ear pain.   Eyes: Negative.   Respiratory: Negative for cough, choking, chest tightness and shortness of breath.   Cardiovascular: Negative for chest pain, palpitations and leg swelling.  Gastrointestinal: Negative for nausea, vomiting, abdominal pain, diarrhea, constipation and abdominal distention.  Endocrine: Negative for cold intolerance, heat intolerance, polydipsia, polyphagia and polyuria.  Musculoskeletal: Positive for arthralgias, back pain and gait problem.  Skin: Negative.   Neurological: Negative for dizziness, tremors, seizures, weakness, light-headedness, numbness and headaches.  Hematological: Negative.   Psychiatric/Behavioral: Negative for sleep disturbance.       Objective:BP 132/78  Pulse 76  Ht 5\' 7"  (1.702 m)  Wt 145 lb 9.6 oz (66.044 kg)  BMI 22.8 kg/m2    Physical Exam  Constitutional: She is oriented to person, place, and time. She appears well-nourished. No distress.  HENT:  Head: Normocephalic and atraumatic.  Right Ear: External ear normal.  Left Ear: External ear normal.  Nose: Nose normal.  Eyes: Conjunctivae and EOM are normal. Pupils are equal, round, and reactive to light.  Prescription lenses.  Neck: Normal range of motion. Neck supple. No JVD present. No tracheal deviation present. No thyromegaly present.  Cardiovascular: Normal rate, regular rhythm, normal heart sounds and intact distal pulses.  Exam reveals no gallop and no friction rub.   No murmur heard. Pulmonary/Chest: Effort normal and breath sounds normal. No respiratory distress. She has no wheezes. She has no rales.  Abdominal: Soft. Bowel sounds are normal. She exhibits no distension and no mass. There is no tenderness.  Musculoskeletal: Normal range of motion. She exhibits no edema and no tenderness.  Lymphadenopathy:    She has no cervical adenopathy.  Neurological: She is  alert and oriented to person, place, and time. No cranial nerve deficit.  Skin: Skin is warm and dry. No rash noted. No erythema. No pallor.  Psychiatric: She has a normal mood and affect. Her behavior is normal. Thought content normal.  Patient's affect is bright and conversant. She is not withdrawn during today's visit.          Assessment & Plan:  Hypertension: Controlled  Unintentional weight loss: Reports improvement in appetite  Tremor: Resolved  A-fib: Rate control  Nausea alone: Resolved  Cough: Resolved  Hyperglycemia: The  Other and unspecified alcohol dependence, continuous drinking behavior: Patient reports that she has nearly stopped drinking alcohol.

## 2013-07-13 DIAGNOSIS — R279 Unspecified lack of coordination: Secondary | ICD-10-CM | POA: Diagnosis not present

## 2013-07-13 DIAGNOSIS — R269 Unspecified abnormalities of gait and mobility: Secondary | ICD-10-CM | POA: Diagnosis not present

## 2013-07-13 DIAGNOSIS — M6281 Muscle weakness (generalized): Secondary | ICD-10-CM | POA: Diagnosis not present

## 2013-07-13 DIAGNOSIS — R4182 Altered mental status, unspecified: Secondary | ICD-10-CM | POA: Diagnosis not present

## 2013-07-14 DIAGNOSIS — M6281 Muscle weakness (generalized): Secondary | ICD-10-CM | POA: Diagnosis not present

## 2013-07-14 DIAGNOSIS — R269 Unspecified abnormalities of gait and mobility: Secondary | ICD-10-CM | POA: Diagnosis not present

## 2013-07-14 DIAGNOSIS — R4182 Altered mental status, unspecified: Secondary | ICD-10-CM | POA: Diagnosis not present

## 2013-07-14 DIAGNOSIS — R279 Unspecified lack of coordination: Secondary | ICD-10-CM | POA: Diagnosis not present

## 2013-07-15 DIAGNOSIS — M1710 Unilateral primary osteoarthritis, unspecified knee: Secondary | ICD-10-CM | POA: Diagnosis not present

## 2013-07-16 DIAGNOSIS — M6281 Muscle weakness (generalized): Secondary | ICD-10-CM | POA: Diagnosis not present

## 2013-07-16 DIAGNOSIS — R4182 Altered mental status, unspecified: Secondary | ICD-10-CM | POA: Diagnosis not present

## 2013-07-16 DIAGNOSIS — R279 Unspecified lack of coordination: Secondary | ICD-10-CM | POA: Diagnosis not present

## 2013-07-16 DIAGNOSIS — R269 Unspecified abnormalities of gait and mobility: Secondary | ICD-10-CM | POA: Diagnosis not present

## 2013-07-20 DIAGNOSIS — M6281 Muscle weakness (generalized): Secondary | ICD-10-CM | POA: Diagnosis not present

## 2013-07-20 DIAGNOSIS — R269 Unspecified abnormalities of gait and mobility: Secondary | ICD-10-CM | POA: Diagnosis not present

## 2013-07-20 DIAGNOSIS — R279 Unspecified lack of coordination: Secondary | ICD-10-CM | POA: Diagnosis not present

## 2013-07-20 DIAGNOSIS — R4182 Altered mental status, unspecified: Secondary | ICD-10-CM | POA: Diagnosis not present

## 2013-07-21 DIAGNOSIS — R279 Unspecified lack of coordination: Secondary | ICD-10-CM | POA: Diagnosis not present

## 2013-07-21 DIAGNOSIS — M6281 Muscle weakness (generalized): Secondary | ICD-10-CM | POA: Diagnosis not present

## 2013-07-21 DIAGNOSIS — R269 Unspecified abnormalities of gait and mobility: Secondary | ICD-10-CM | POA: Diagnosis not present

## 2013-07-21 DIAGNOSIS — R4182 Altered mental status, unspecified: Secondary | ICD-10-CM | POA: Diagnosis not present

## 2013-07-23 DIAGNOSIS — R269 Unspecified abnormalities of gait and mobility: Secondary | ICD-10-CM | POA: Diagnosis not present

## 2013-07-23 DIAGNOSIS — M6281 Muscle weakness (generalized): Secondary | ICD-10-CM | POA: Diagnosis not present

## 2013-07-23 DIAGNOSIS — R4182 Altered mental status, unspecified: Secondary | ICD-10-CM | POA: Diagnosis not present

## 2013-07-23 DIAGNOSIS — R279 Unspecified lack of coordination: Secondary | ICD-10-CM | POA: Diagnosis not present

## 2013-07-26 DIAGNOSIS — R279 Unspecified lack of coordination: Secondary | ICD-10-CM | POA: Diagnosis not present

## 2013-07-26 DIAGNOSIS — R269 Unspecified abnormalities of gait and mobility: Secondary | ICD-10-CM | POA: Diagnosis not present

## 2013-07-26 DIAGNOSIS — M6281 Muscle weakness (generalized): Secondary | ICD-10-CM | POA: Diagnosis not present

## 2013-07-26 DIAGNOSIS — R4182 Altered mental status, unspecified: Secondary | ICD-10-CM | POA: Diagnosis not present

## 2013-07-28 DIAGNOSIS — R4182 Altered mental status, unspecified: Secondary | ICD-10-CM | POA: Diagnosis not present

## 2013-07-28 DIAGNOSIS — M6281 Muscle weakness (generalized): Secondary | ICD-10-CM | POA: Diagnosis not present

## 2013-07-28 DIAGNOSIS — R269 Unspecified abnormalities of gait and mobility: Secondary | ICD-10-CM | POA: Diagnosis not present

## 2013-07-28 DIAGNOSIS — R279 Unspecified lack of coordination: Secondary | ICD-10-CM | POA: Diagnosis not present

## 2013-08-05 ENCOUNTER — Other Ambulatory Visit: Payer: Self-pay | Admitting: Internal Medicine

## 2013-08-06 DIAGNOSIS — R279 Unspecified lack of coordination: Secondary | ICD-10-CM | POA: Diagnosis not present

## 2013-08-06 DIAGNOSIS — R269 Unspecified abnormalities of gait and mobility: Secondary | ICD-10-CM | POA: Diagnosis not present

## 2013-08-06 DIAGNOSIS — R4182 Altered mental status, unspecified: Secondary | ICD-10-CM | POA: Diagnosis not present

## 2013-08-06 DIAGNOSIS — M6281 Muscle weakness (generalized): Secondary | ICD-10-CM | POA: Diagnosis not present

## 2013-10-08 ENCOUNTER — Other Ambulatory Visit: Payer: Self-pay | Admitting: Internal Medicine

## 2013-11-08 ENCOUNTER — Non-Acute Institutional Stay: Payer: Medicare Other | Admitting: Internal Medicine

## 2013-11-08 ENCOUNTER — Encounter: Payer: Self-pay | Admitting: Internal Medicine

## 2013-11-08 VITALS — BP 144/66 | HR 64 | Ht 67.0 in | Wt 166.0 lb

## 2013-11-08 DIAGNOSIS — R05 Cough: Secondary | ICD-10-CM

## 2013-11-08 DIAGNOSIS — I10 Essential (primary) hypertension: Secondary | ICD-10-CM | POA: Diagnosis not present

## 2013-11-08 DIAGNOSIS — R110 Nausea: Secondary | ICD-10-CM

## 2013-11-08 DIAGNOSIS — F329 Major depressive disorder, single episode, unspecified: Secondary | ICD-10-CM

## 2013-11-08 DIAGNOSIS — G4700 Insomnia, unspecified: Secondary | ICD-10-CM

## 2013-11-08 DIAGNOSIS — F3289 Other specified depressive episodes: Secondary | ICD-10-CM | POA: Diagnosis not present

## 2013-11-08 DIAGNOSIS — L299 Pruritus, unspecified: Secondary | ICD-10-CM | POA: Insufficient documentation

## 2013-11-08 DIAGNOSIS — I4891 Unspecified atrial fibrillation: Secondary | ICD-10-CM | POA: Diagnosis not present

## 2013-11-08 DIAGNOSIS — R251 Tremor, unspecified: Secondary | ICD-10-CM

## 2013-11-08 DIAGNOSIS — F32A Depression, unspecified: Secondary | ICD-10-CM

## 2013-11-08 DIAGNOSIS — R634 Abnormal weight loss: Secondary | ICD-10-CM

## 2013-11-08 DIAGNOSIS — R259 Unspecified abnormal involuntary movements: Secondary | ICD-10-CM

## 2013-11-08 DIAGNOSIS — R059 Cough, unspecified: Secondary | ICD-10-CM

## 2013-11-08 MED ORDER — HYDROXYZINE HCL 10 MG PO TABS: ORAL_TABLET | ORAL | Status: DC

## 2013-11-08 MED ORDER — FLECAINIDE ACETATE 50 MG PO TABS: ORAL_TABLET | ORAL | Status: DC

## 2013-11-08 NOTE — Progress Notes (Signed)
Patient ID: [name], female   DOB: 22-Jun-1933, 80 y.o.   MRN: [id number]    Location:  [facility] (12)    Allergies  Allergen Reactions  . Mirtazapine     tremor  . Nickel Other (See Comments)    inflammation  . Other     Prednisone causes sleep disturbances    Chief Complaint  Patient presents with  . Medical Managment of Chronic Issues    blood pressure, A-Fib, depression. Patient has increased appetitie since here weight was 145lbs on 07/12/13 up 21 lbs since then    HPI:  Hypertension: controlled  A-fib: controlled  Depression:improved  Nausea alone: occurs about once every 6 weeks  Cough: intermittent. Non-productive.  Unintentional weight loss: regained 21#  Tremor: resolved  Insomnia, : benefits from zolpidem    Medications: Patient's Medications  New Prescriptions   No medications on file  Previous Medications   ACETAMINOPHEN (TYLENOL) 325 MG TABLET    Take 650 mg by mouth 2 (two) times daily as needed. For pain   APIXABAN (ELIQUIS) 5 MG TABS TABLET    Take 1 tablet (5 mg total) by mouth 2 (two) times daily.   CHOLESTYRAMINE (QUESTRAN) 4 GM/DOSE POWDER    Take 4 grams 3 times daily to help naussea.   DILTIAZEM (CARDIZEM CD) 180 MG 24 HR CAPSULE    Take 180 mg by mouth daily.   FEEDING SUPPLEMENT (ENSURE COMPLETE) LIQD    Take 237 mLs by mouth 2 (two) times daily between meals.   FLECAINIDE (TAMBOCOR) 50 MG TABLET    Take 50 mg by mouth 2 (two) times daily.    LOSARTAN (COZAAR) 100 MG TABLET    TAKE ONE TABLET BY MOUTH DAILY   METOPROLOL SUCCINATE (TOPROL-XL) 50 MG 24 HR TABLET    TAKE 1 TABLET BY MOUTH DAILY   PROMETHAZINE (PHENERGAN) 25 MG TABLET    Take 25 mg by mouth daily as needed. For nausea and vomiting   ZOLPIDEM (AMBIEN) 5 MG TABLET    Take 1 tablet (5 mg total) by mouth at bedtime as needed for sleep.  Modified Medications   No medications on file  Discontinued Medications   No  medications on file     Review of Systems  Constitutional: Negative for fever, chills, activity change and appetite change.       Continues to lose weight.  HENT: Positive for hearing loss. Negative for congestion and ear pain.   Eyes: Negative.   Respiratory: Negative for cough, choking, chest tightness and shortness of breath.   Cardiovascular: Negative for chest pain, palpitations and leg swelling.  Gastrointestinal: Negative for nausea, vomiting, abdominal pain, diarrhea, constipation and abdominal distention.  Endocrine: Negative for cold intolerance, heat intolerance, polydipsia, polyphagia and polyuria.  Musculoskeletal: Positive for arthralgias, back pain and gait problem.  Skin: Negative.   Neurological: Negative for dizziness, tremors, seizures, weakness, light-headedness, numbness and headaches.  Hematological: Negative.   Psychiatric/Behavioral: Positive for sleep disturbance.    Filed Vitals:   11/08/13 1401  BP: 144/66  Pulse: 64  Height: 5' 7" (1.702 m)  Weight: 166 lb (75.297 kg)   Physical Exam  Constitutional: She is oriented to person, place, and time. She appears well-nourished. No distress.  HENT:  Head: Normocephalic and atraumatic.  Right Ear: External ear normal.  Left Ear: External ear normal.  Nose: Nose normal.  Eyes: Conjunctivae and EOM are normal. Pupils are equal, round, and reactive  to light.  Prescription lenses.  Neck: Normal range of motion. Neck supple. No JVD present. No tracheal deviation present. No thyromegaly present.  Cardiovascular: Normal rate, regular rhythm and intact distal pulses.  Exam reveals no gallop and no friction rub.   Murmur (3/6/aortic ejection murmur) heard. Pulmonary/Chest: Effort normal and breath sounds normal. No respiratory distress. She has no wheezes. She has no rales.  Abdominal: Soft. Bowel sounds are normal. She exhibits no distension and no mass. There is no tenderness.  Musculoskeletal: Normal range of  motion. She exhibits no edema and no tenderness.  Lymphadenopathy:    She has no cervical adenopathy.  Neurological: She is alert and oriented to person, place, and time. No cranial nerve deficit.  Skin: Skin is warm and dry. No rash noted. No erythema. No pallor.  Psychiatric: She has a normal mood and affect. Her behavior is normal. Thought content normal.  Patient's affect is bright and conversant. She is not withdrawn during today's visit.     Labs reviewed: No visits with results within 3 Month(s) from this visit. Latest known visit with results is:  Admission on 06/05/2013, Discharged on 06/08/2013  Component Date Value Ref Range Status  . WBC 06/05/2013 10.2  4.0 - 10.5 K/uL Final  . RBC 06/05/2013 4.60  3.87 - 5.11 MIL/uL Final  . Hemoglobin 06/05/2013 14.0  12.0 - 15.0 g/dL Final  . HCT 06/05/2013 40.0  36.0 - 46.0 % Final  . MCV 06/05/2013 87.0  78.0 - 100.0 fL Final  . MCH 06/05/2013 30.4  26.0 - 34.0 pg Final  . MCHC 06/05/2013 35.0  30.0 - 36.0 g/dL Final  . RDW 06/05/2013 13.4  11.5 - 15.5 % Final  . Platelets 06/05/2013 391  150 - 400 K/uL Final  . Neutrophils Relative % 06/05/2013 83* 43 - 77 % Final  . Neutro Abs 06/05/2013 8.5* 1.7 - 7.7 K/uL Final  . Lymphocytes Relative 06/05/2013 11* 12 - 46 % Final  . Lymphs Abs 06/05/2013 1.1  0.7 - 4.0 K/uL Final  . Monocytes Relative 06/05/2013 6  3 - 12 % Final  . Monocytes Absolute 06/05/2013 0.6  0.1 - 1.0 K/uL Final  . Eosinophils Relative 06/05/2013 0  0 - 5 % Final  . Eosinophils Absolute 06/05/2013 0.0  0.0 - 0.7 K/uL Final  . Basophils Relative 06/05/2013 0  0 - 1 % Final  . Basophils Absolute 06/05/2013 0.0  0.0 - 0.1 K/uL Final  . Sodium 06/05/2013 132* 135 - 145 mEq/L Final  . Potassium 06/05/2013 3.8  3.5 - 5.1 mEq/L Final  . Chloride 06/05/2013 95* 96 - 112 mEq/L Final  . CO2 06/05/2013 23  19 - 32 mEq/L Final  . Glucose, Bld 06/05/2013 203* 70 - 99 mg/dL Final  . BUN 06/05/2013 10  6 - 23 mg/dL Final  .  Creatinine, Ser 06/05/2013 0.61  0.50 - 1.10 mg/dL Final  . Calcium 06/05/2013 9.5  8.4 - 10.5 mg/dL Final  . Total Protein 06/05/2013 7.1  6.0 - 8.3 g/dL Final  . Albumin 06/05/2013 4.1  3.5 - 5.2 g/dL Final  . AST 06/05/2013 17  0 - 37 U/L Final  . ALT 06/05/2013 13  0 - 35 U/L Final  . Alkaline Phosphatase 06/05/2013 66  39 - 117 U/L Final  . Total Bilirubin 06/05/2013 0.6  0.3 - 1.2 mg/dL Final  . GFR calc non Af Amer 06/05/2013 85* >90 mL/min Final  . GFR calc Af Amer 06/05/2013 >90  >  90 mL/min Final   Comment: (NOTE)                          The eGFR has been calculated using the CKD EPI equation.                          This calculation has not been validated in all clinical situations.                          eGFR's persistently <90 mL/min signify possible Chronic Kidney                          Disease.  . Troponin I 06/05/2013 <0.30  <0.30 ng/mL Final   Comment:                                 Due to the release kinetics of cTnI,                          a negative result within the first hours                          of the onset of symptoms does not rule out                          myocardial infarction with certainty.                          If myocardial infarction is still suspected,                          repeat the test at appropriate intervals.  . Color, Urine 06/05/2013 YELLOW  YELLOW Final  . APPearance 06/05/2013 CLEAR  CLEAR Final  . Specific Gravity, Urine 06/05/2013 1.016  1.005 - 1.030 Final  . pH 06/05/2013 7.5  5.0 - 8.0 Final  . Glucose, UA 06/05/2013 100* NEGATIVE mg/dL Final  . Hgb urine dipstick 06/05/2013 NEGATIVE  NEGATIVE Final  . Bilirubin Urine 06/05/2013 NEGATIVE  NEGATIVE Final  . Ketones, ur 06/05/2013 40* NEGATIVE mg/dL Final  . Protein, ur 06/05/2013 NEGATIVE  NEGATIVE mg/dL Final  . Urobilinogen, UA 06/05/2013 0.2  0.0 - 1.0 mg/dL Final  . Nitrite 06/05/2013 NEGATIVE  NEGATIVE Final  . Leukocytes, UA 06/05/2013 NEGATIVE  NEGATIVE  Final   MICROSCOPIC NOT DONE ON URINES WITH NEGATIVE PROTEIN, BLOOD, LEUKOCYTES, NITRITE, OR GLUCOSE <1000 mg/dL.  . Lactic Acid, Venous 06/05/2013 1.89  0.5 - 2.2 mmol/L Final  . Prothrombin Time 06/05/2013 15.9* 11.6 - 15.2 seconds Final  . INR 06/05/2013 1.30  0.00 - 1.49 Final  . Glucose-Capillary 06/05/2013 192* 70 - 99 mg/dL Final  . Comment 1 06/05/2013 Notify RN   Final  . Specimen Description 06/05/2013 BLOOD RIGHT HAND   Final  . Special Requests 06/05/2013 BOTTLES DRAWN AEROBIC AND ANAEROBIC 2 CC EA   Final  . Culture  Setup Time 06/05/2013    Final                   Value:06/05/2013 19:01  Performed at [facility]  . Culture 06/05/2013    Final                   Value:NO GROWTH 5 DAYS                         Performed at [facility]  . Report Status 06/05/2013 06/11/2013 FINAL   Final  . Specimen Description 06/05/2013 BLOOD LEFT HAND   Final  . Special Requests 06/05/2013 BOTTLES DRAWN AEROBIC AND ANAEROBIC 2 CC EA   Final  . Culture  Setup Time 06/05/2013    Final                   Value:06/05/2013 19:01                         Performed at [facility]  . Culture 06/05/2013    Final                   Value:NO GROWTH 5 DAYS                         Performed at [facility]  . Report Status 06/05/2013 06/11/2013 FINAL   Final  . Specimen Description 06/05/2013 URINE, CATHETERIZED   Final  . Special Requests 06/05/2013 NONE   Final  . Culture  Setup Time 06/05/2013    Final                   Value:06/05/2013 20:02                         Performed at [facility]  . Colony Count 06/05/2013    Final                   Value:NO GROWTH                         Performed at [facility]  . Culture 06/05/2013    Final                   Value:NO GROWTH                         Performed at [facility]  . Report Status 06/05/2013 06/06/2013 FINAL   Final  . Magnesium 06/05/2013 1.8  1.5 - 2.5  mg/dL Final  . TSH 06/05/2013 1.215  0.350 - 4.500 uIU/mL Final   Performed at [facility]  . Vitamin B-12 06/05/2013 443  211 - 911 pg/mL Final   Performed at [facility]  . RBC Folate 06/05/2013 680* >=366 ng/mL Final   Comment: Reference range not established for pediatric patients.                          Performed at [facility]  . RPR 06/05/2013 NON REACTIVE  NON REACTIVE Final   Performed at [facility]  . MRSA by PCR 06/05/2013 NEGATIVE  NEGATIVE Final   Comment:                                 The GeneXpert MRSA Assay (FDA  approved for NASAL specimens                          only), is one component of a                          comprehensive MRSA colonization                          surveillance program. It is not                          intended to diagnose MRSA                          infection nor to guide or                          monitor treatment for                          MRSA infections.  . Sodium 06/06/2013 131* 135 - 145 mEq/L Final  . Potassium 06/06/2013 3.1* 3.5 - 5.1 mEq/L Final   DELTA CHECK NOTED  . Chloride 06/06/2013 97  96 - 112 mEq/L Final  . CO2 06/06/2013 22  19 - 32 mEq/L Final  . Glucose, Bld 06/06/2013 194* 70 - 99 mg/dL Final  . BUN 06/06/2013 11  6 - 23 mg/dL Final  . Creatinine, Ser 06/06/2013 0.55  0.50 - 1.10 mg/dL Final  . Calcium 06/06/2013 8.7  8.4 - 10.5 mg/dL Final  . GFR calc non Af Amer 06/06/2013 88* >90 mL/min Final  . GFR calc Af Amer 06/06/2013 >90  >90 mL/min Final   Comment: (NOTE)                          The eGFR has been calculated using the CKD EPI equation.                          This calculation has not been validated in all clinical situations.                          eGFR's persistently <90 mL/min signify possible Chronic Kidney                          Disease.  . WBC 06/06/2013 5.2  4.0 - 10.5 K/uL Final  . RBC 06/06/2013 4.28  3.87 - 5.11 MIL/uL  Final  . Hemoglobin 06/06/2013 13.0  12.0 - 15.0 g/dL Final  . HCT 06/06/2013 36.7  36.0 - 46.0 % Final  . MCV 06/06/2013 85.7  78.0 - 100.0 fL Final  . MCH 06/06/2013 30.4  26.0 - 34.0 pg Final  . MCHC 06/06/2013 35.4  30.0 - 36.0 g/dL Final  . RDW 06/06/2013 13.4  11.5 - 15.5 % Final  . Platelets 06/06/2013 324  150 - 400 K/uL Final  . Magnesium 06/06/2013 1.9  1.5 - 2.5 mg/dL Final  . Sodium 06/07/2013 132* 135 - 145 mEq/L Final  . Potassium 06/07/2013 4.2  3.5 - 5.1 mEq/L Final   Comment: DELTA CHECK NOTED  REPEATED TO VERIFY                          NO VISIBLE HEMOLYSIS  . Chloride 06/07/2013 100  96 - 112 mEq/L Final  . CO2 06/07/2013 21  19 - 32 mEq/L Final  . Glucose, Bld 06/07/2013 187* 70 - 99 mg/dL Final  . BUN 06/07/2013 19  6 - 23 mg/dL Final  . Creatinine, Ser 06/07/2013 0.63  0.50 - 1.10 mg/dL Final  . Calcium 06/07/2013 9.3  8.4 - 10.5 mg/dL Final  . GFR calc non Af Amer 06/07/2013 84* >90 mL/min Final  . GFR calc Af Amer 06/07/2013 >90  >90 mL/min Final   Comment: (NOTE)                          The eGFR has been calculated using the CKD EPI equation.                          This calculation has not been validated in all clinical situations.                          eGFR's persistently <90 mL/min signify possible Chronic Kidney                          Disease.  . WBC 06/07/2013 9.2  4.0 - 10.5 K/uL Final  . RBC 06/07/2013 4.26  3.87 - 5.11 MIL/uL Final  . Hemoglobin 06/07/2013 13.0  12.0 - 15.0 g/dL Final  . HCT 06/07/2013 37.0  36.0 - 46.0 % Final  . MCV 06/07/2013 86.9  78.0 - 100.0 fL Final  . MCH 06/07/2013 30.5  26.0 - 34.0 pg Final  . MCHC 06/07/2013 35.1  30.0 - 36.0 g/dL Final  . RDW 06/07/2013 13.8  11.5 - 15.5 % Final  . Platelets 06/07/2013 321  150 - 400 K/uL Final  . WBC 06/08/2013 8.1  4.0 - 10.5 K/uL Final  . RBC 06/08/2013 3.88  3.87 - 5.11 MIL/uL Final  . Hemoglobin 06/08/2013 11.7* 12.0 - 15.0 g/dL Final  . HCT  06/08/2013 34.1* 36.0 - 46.0 % Final  . MCV 06/08/2013 87.9  78.0 - 100.0 fL Final  . MCH 06/08/2013 30.2  26.0 - 34.0 pg Final  . MCHC 06/08/2013 34.3  30.0 - 36.0 g/dL Final  . RDW 06/08/2013 13.9  11.5 - 15.5 % Final  . Platelets 06/08/2013 271  150 - 400 K/uL Final  . Sodium 06/08/2013 136  135 - 145 mEq/L Final  . Potassium 06/08/2013 4.2  3.5 - 5.1 mEq/L Final  . Chloride 06/08/2013 102  96 - 112 mEq/L Final  . CO2 06/08/2013 25  19 - 32 mEq/L Final  . Glucose, Bld 06/08/2013 132* 70 - 99 mg/dL Final  . BUN 06/08/2013 17  6 - 23 mg/dL Final  . Creatinine, Ser 06/08/2013 0.68  0.50 - 1.10 mg/dL Final  . Calcium 06/08/2013 8.9  8.4 - 10.5 mg/dL Final  . GFR calc non Af Amer 06/08/2013 82* >90 mL/min Final  . GFR calc Af Amer 06/08/2013 >90  >90 mL/min Final   Comment: (NOTE)                          The eGFR has been calculated using  the CKD EPI equation.                          This calculation has not been validated in all clinical situations.                          eGFR's persistently <90 mL/min signify possible Chronic Kidney                          Disease.  Marland Kitchen Hemoglobin A1C 06/08/2013 5.2  <5.7 % Final   Comment: (NOTE)                                                                                                                         According to the ADA Clinical Practice Recommendations for 2011, when                          HbA1c is used as a screening test:                           >=6.5%   Diagnostic of Diabetes Mellitus                                    (if abnormal result is confirmed)                          5.7-6.4%   Increased risk of developing Diabetes Mellitus                          References:Diagnosis and Classification of Diabetes Mellitus,Diabetes                          ZGYF,7494,49(QPRFF 1):S62-S69 and Standards of Medical Care in                                  Diabetes - 2011,Diabetes MBWG,6659,93 (Suppl 1):S11-S61.  . Mean Plasma Glucose  06/08/2013 103  <117 mg/dL Final   Performed at [facility]  . Vancomycin Tr 06/08/2013 12.4  10.0 - 20.0 ug/mL Final      Assessment/Plan  1. Hypertension controlled  2. A-fib controlled - flecainide (TAMBOCOR) 50 MG tablet; One tablet twice daily to control atrial fibrillation  Dispense: 180 tablet; Refill: 3  3. Depression improved  4. Nausea alone rare  5. Cough improved  6. Unintentional weight loss resolved  7. Tremor resolved  8. Insomnia, unspecified Better with zolpidem  9. Pruritus New problem - hydrOXYzine (ATARAX/VISTARIL) 10 MG tablet; One up to 4 times in 24 hours if needed  to control itching  Dispense: 120 tablet; Refill: 0

## 2013-11-15 ENCOUNTER — Other Ambulatory Visit: Payer: Self-pay | Admitting: Internal Medicine

## 2013-12-06 ENCOUNTER — Encounter: Payer: Self-pay | Admitting: Internal Medicine

## 2013-12-06 ENCOUNTER — Non-Acute Institutional Stay: Payer: Medicare Other | Admitting: Internal Medicine

## 2013-12-06 VITALS — BP 130/66 | HR 72 | Temp 97.7°F | Wt 171.0 lb

## 2013-12-06 DIAGNOSIS — J069 Acute upper respiratory infection, unspecified: Secondary | ICD-10-CM | POA: Diagnosis not present

## 2013-12-06 DIAGNOSIS — M1990 Unspecified osteoarthritis, unspecified site: Secondary | ICD-10-CM | POA: Diagnosis not present

## 2013-12-06 NOTE — Progress Notes (Signed)
Patient ID: [name], female   DOB: Aug 01, 1933, 80 y.o.   MRN: [id number]    Location:  [facility] (12)    Allergies  Allergen Reactions  . Mirtazapine     tremor  . Nickel Other (See Comments)    inflammation  . Other     Prednisone causes sleep disturbances    Chief Complaint  Patient presents with  . Wheezing    and chest congestion, since yesterday     HPI:  2 day history of sore throat, cough, and wheeze. May have gotten a little better today.  No fever.  Complains of knee pains.  Medications: Patient's Medications  New Prescriptions   No medications on file  Previous Medications   ACETAMINOPHEN (TYLENOL) 325 MG TABLET    Take 650 mg by mouth 2 (two) times daily as needed. For pain   APIXABAN (ELIQUIS) 5 MG TABS TABLET    Take 1 tablet (5 mg total) by mouth 2 (two) times daily.   CHOLESTYRAMINE (QUESTRAN) 4 GM/DOSE POWDER    Take 4 grams 3 times daily to help naussea.   DILTIAZEM (CARDIZEM CD) 180 MG 24 HR CAPSULE    Take 180 mg by mouth daily.   FEEDING SUPPLEMENT (ENSURE COMPLETE) LIQD    Take 237 mLs by mouth 2 (two) times daily between meals.   FLECAINIDE (TAMBOCOR) 50 MG TABLET    One tablet twice daily to control atrial fibrillation   HYDROXYZINE (ATARAX/VISTARIL) 10 MG TABLET    One up to 4 times in 24 hours if needed to control itching   LOSARTAN (COZAAR) 100 MG TABLET    TAKE 1 TABLET BY MOUTH DAILY   METOPROLOL SUCCINATE (TOPROL-XL) 50 MG 24 HR TABLET    TAKE 1 TABLET BY MOUTH DAILY   PROMETHAZINE (PHENERGAN) 25 MG TABLET    Take 25 mg by mouth daily as needed. For nausea and vomiting   ZOLPIDEM (AMBIEN) 5 MG TABLET    Take 1 tablet (5 mg total) by mouth at bedtime as needed for sleep.  Modified Medications   No medications on file  Discontinued Medications   No medications on file     Review of Systems  Constitutional: Negative for fever, chills, activity change and appetite change.   Continues to lose weight.  HENT: Positive for hearing loss and sinus pressure. Negative for congestion and ear pain.   Eyes: Negative.   Respiratory: Positive for cough, shortness of breath and wheezing. Negative for choking and chest tightness.   Cardiovascular: Negative for chest pain, palpitations and leg swelling.  Gastrointestinal: Negative for nausea, vomiting, abdominal pain, diarrhea, constipation and abdominal distention.  Endocrine: Negative for cold intolerance, heat intolerance, polydipsia, polyphagia and polyuria.  Musculoskeletal: Positive for arthralgias (both knees), back pain and gait problem.  Skin: Negative.   Neurological: Negative for dizziness, tremors, seizures, weakness, light-headedness, numbness and headaches.  Hematological: Negative.   Psychiatric/Behavioral: Positive for sleep disturbance.    Filed Vitals:   12/06/13 1600  BP: 130/66  Pulse: 72  Temp: 97.7 F (36.5 C)  TempSrc: Oral  Weight: 171 lb (77.565 kg)   Physical Exam  Constitutional: She is oriented to person, place, and time. She appears well-nourished. No distress.  HENT:  Head: Normocephalic and atraumatic.  Right Ear: External ear normal.  Left Ear: External ear normal.  Nose: Nose normal.  Eyes: Conjunctivae and EOM are normal. Pupils are equal, round, and reactive to light.  Prescription lenses.  Neck: Normal range of motion. Neck supple. No JVD present. No tracheal deviation present. No thyromegaly present.  Cardiovascular: Normal rate, regular rhythm and intact distal pulses.  Exam reveals no gallop and no friction rub.   Murmur (3/6/aortic ejection murmur) heard. Pulmonary/Chest: Effort normal and breath sounds normal. No respiratory distress. She has no wheezes. She has no rales. She exhibits no tenderness.  Abdominal: Soft. Bowel sounds are normal. She exhibits no distension and no mass. There is no tenderness.  Musculoskeletal: Normal range of motion. She exhibits tenderness (both  knees). She exhibits no edema.  Lymphadenopathy:    She has no cervical adenopathy.  Neurological: She is alert and oriented to person, place, and time. No cranial nerve deficit.  Skin: Skin is warm and dry. No rash noted. No erythema. No pallor.  Psychiatric: She has a normal mood and affect. Her behavior is normal. Thought content normal.  Patient's affect is bright and conversant. She is not withdrawn during today's visit.     Labs reviewed: No visits with results within 3 Month(s) from this visit. Latest known visit with results is:  Admission on 06/05/2013, Discharged on 06/08/2013  Component Date Value Ref Range Status  . WBC 06/05/2013 10.2  4.0 - 10.5 K/uL Final  . RBC 06/05/2013 4.60  3.87 - 5.11 MIL/uL Final  . Hemoglobin 06/05/2013 14.0  12.0 - 15.0 g/dL Final  . HCT 06/05/2013 40.0  36.0 - 46.0 % Final  . MCV 06/05/2013 87.0  78.0 - 100.0 fL Final  . MCH 06/05/2013 30.4  26.0 - 34.0 pg Final  . MCHC 06/05/2013 35.0  30.0 - 36.0 g/dL Final  . RDW 06/05/2013 13.4  11.5 - 15.5 % Final  . Platelets 06/05/2013 391  150 - 400 K/uL Final  . Neutrophils Relative % 06/05/2013 83* 43 - 77 % Final  . Neutro Abs 06/05/2013 8.5* 1.7 - 7.7 K/uL Final  . Lymphocytes Relative 06/05/2013 11* 12 - 46 % Final  . Lymphs Abs 06/05/2013 1.1  0.7 - 4.0 K/uL Final  . Monocytes Relative 06/05/2013 6  3 - 12 % Final  . Monocytes Absolute 06/05/2013 0.6  0.1 - 1.0 K/uL Final  . Eosinophils Relative 06/05/2013 0  0 - 5 % Final  . Eosinophils Absolute 06/05/2013 0.0  0.0 - 0.7 K/uL Final  . Basophils Relative 06/05/2013 0  0 - 1 % Final  . Basophils Absolute 06/05/2013 0.0  0.0 - 0.1 K/uL Final  . Sodium 06/05/2013 132* 135 - 145 mEq/L Final  . Potassium 06/05/2013 3.8  3.5 - 5.1 mEq/L Final  . Chloride 06/05/2013 95* 96 - 112 mEq/L Final  . CO2 06/05/2013 23  19 - 32 mEq/L Final  . Glucose, Bld 06/05/2013 203* 70 - 99 mg/dL Final  . BUN 06/05/2013 10  6 - 23 mg/dL Final  . Creatinine, Ser  06/05/2013 0.61  0.50 - 1.10 mg/dL Final  . Calcium 06/05/2013 9.5  8.4 - 10.5 mg/dL Final  . Total Protein 06/05/2013 7.1  6.0 - 8.3 g/dL Final  . Albumin 06/05/2013 4.1  3.5 - 5.2 g/dL Final  . AST 06/05/2013 17  0 - 37 U/L Final  . ALT 06/05/2013 13  0 - 35 U/L Final  . Alkaline Phosphatase 06/05/2013 66  39 - 117 U/L Final  . Total Bilirubin 06/05/2013 0.6  0.3 - 1.2 mg/dL Final  . GFR calc non Af Amer 06/05/2013 85* >90 mL/min Final  . GFR calc Af Wyvonnia Lora  06/05/2013 >90  >90 mL/min Final   Comment: (NOTE)                          The eGFR has been calculated using the CKD EPI equation.                          This calculation has not been validated in all clinical situations.                          eGFR's persistently <90 mL/min signify possible Chronic Kidney                          Disease.  . Troponin I 06/05/2013 <0.30  <0.30 ng/mL Final   Comment:                                 Due to the release kinetics of cTnI,                          a negative result within the first hours                          of the onset of symptoms does not rule out                          myocardial infarction with certainty.                          If myocardial infarction is still suspected,                          repeat the test at appropriate intervals.  . Color, Urine 06/05/2013 YELLOW  YELLOW Final  . APPearance 06/05/2013 CLEAR  CLEAR Final  . Specific Gravity, Urine 06/05/2013 1.016  1.005 - 1.030 Final  . pH 06/05/2013 7.5  5.0 - 8.0 Final  . Glucose, UA 06/05/2013 100* NEGATIVE mg/dL Final  . Hgb urine dipstick 06/05/2013 NEGATIVE  NEGATIVE Final  . Bilirubin Urine 06/05/2013 NEGATIVE  NEGATIVE Final  . Ketones, ur 06/05/2013 40* NEGATIVE mg/dL Final  . Protein, ur 06/05/2013 NEGATIVE  NEGATIVE mg/dL Final  . Urobilinogen, UA 06/05/2013 0.2  0.0 - 1.0 mg/dL Final  . Nitrite 06/05/2013 NEGATIVE  NEGATIVE Final  . Leukocytes, UA 06/05/2013 NEGATIVE  NEGATIVE Final   MICROSCOPIC  NOT DONE ON URINES WITH NEGATIVE PROTEIN, BLOOD, LEUKOCYTES, NITRITE, OR GLUCOSE <1000 mg/dL.  . Lactic Acid, Venous 06/05/2013 1.89  0.5 - 2.2 mmol/L Final  . Prothrombin Time 06/05/2013 15.9* 11.6 - 15.2 seconds Final  . INR 06/05/2013 1.30  0.00 - 1.49 Final  . Glucose-Capillary 06/05/2013 192* 70 - 99 mg/dL Final  . Comment 1 06/05/2013 Notify RN   Final  . Specimen Description 06/05/2013 BLOOD RIGHT HAND   Final  . Special Requests 06/05/2013 BOTTLES DRAWN AEROBIC AND ANAEROBIC 2 CC EA   Final  . Culture  Setup Time 06/05/2013    Final                   Value:06/05/2013 19:01  Performed at [facility]  . Culture 06/05/2013    Final                   Value:NO GROWTH 5 DAYS                         Performed at [facility]  . Report Status 06/05/2013 06/11/2013 FINAL   Final  . Specimen Description 06/05/2013 BLOOD LEFT HAND   Final  . Special Requests 06/05/2013 BOTTLES DRAWN AEROBIC AND ANAEROBIC 2 CC EA   Final  . Culture  Setup Time 06/05/2013    Final                   Value:06/05/2013 19:01                         Performed at [facility]  . Culture 06/05/2013    Final                   Value:NO GROWTH 5 DAYS                         Performed at [facility]  . Report Status 06/05/2013 06/11/2013 FINAL   Final  . Specimen Description 06/05/2013 URINE, CATHETERIZED   Final  . Special Requests 06/05/2013 NONE   Final  . Culture  Setup Time 06/05/2013    Final                   Value:06/05/2013 20:02                         Performed at [facility]  . Colony Count 06/05/2013    Final                   Value:NO GROWTH                         Performed at [facility]  . Culture 06/05/2013    Final                   Value:NO GROWTH                         Performed at [facility]  . Report Status 06/05/2013 06/06/2013 FINAL   Final  . Magnesium 06/05/2013 1.8  1.5 - 2.5 mg/dL Final  . TSH  06/05/2013 1.215  0.350 - 4.500 uIU/mL Final   Performed at [facility]  . Vitamin B-12 06/05/2013 443  211 - 911 pg/mL Final   Performed at [facility]  . RBC Folate 06/05/2013 680* >=366 ng/mL Final   Comment: Reference range not established for pediatric patients.                          Performed at [facility]  . RPR 06/05/2013 NON REACTIVE  NON REACTIVE Final   Performed at [facility]  . MRSA by PCR 06/05/2013 NEGATIVE  NEGATIVE Final   Comment:                                 The GeneXpert MRSA Assay (FDA  approved for NASAL specimens                          only), is one component of a                          comprehensive MRSA colonization                          surveillance program. It is not                          intended to diagnose MRSA                          infection nor to guide or                          monitor treatment for                          MRSA infections.  . Sodium 06/06/2013 131* 135 - 145 mEq/L Final  . Potassium 06/06/2013 3.1* 3.5 - 5.1 mEq/L Final   DELTA CHECK NOTED  . Chloride 06/06/2013 97  96 - 112 mEq/L Final  . CO2 06/06/2013 22  19 - 32 mEq/L Final  . Glucose, Bld 06/06/2013 194* 70 - 99 mg/dL Final  . BUN 06/06/2013 11  6 - 23 mg/dL Final  . Creatinine, Ser 06/06/2013 0.55  0.50 - 1.10 mg/dL Final  . Calcium 06/06/2013 8.7  8.4 - 10.5 mg/dL Final  . GFR calc non Af Amer 06/06/2013 88* >90 mL/min Final  . GFR calc Af Amer 06/06/2013 >90  >90 mL/min Final   Comment: (NOTE)                          The eGFR has been calculated using the CKD EPI equation.                          This calculation has not been validated in all clinical situations.                          eGFR's persistently <90 mL/min signify possible Chronic Kidney                          Disease.  . WBC 06/06/2013 5.2  4.0 - 10.5 K/uL Final  . RBC 06/06/2013 4.28  3.87 - 5.11 MIL/uL Final  .  Hemoglobin 06/06/2013 13.0  12.0 - 15.0 g/dL Final  . HCT 06/06/2013 36.7  36.0 - 46.0 % Final  . MCV 06/06/2013 85.7  78.0 - 100.0 fL Final  . MCH 06/06/2013 30.4  26.0 - 34.0 pg Final  . MCHC 06/06/2013 35.4  30.0 - 36.0 g/dL Final  . RDW 06/06/2013 13.4  11.5 - 15.5 % Final  . Platelets 06/06/2013 324  150 - 400 K/uL Final  . Magnesium 06/06/2013 1.9  1.5 - 2.5 mg/dL Final  . Sodium 06/07/2013 132* 135 - 145 mEq/L Final  . Potassium 06/07/2013 4.2  3.5 - 5.1 mEq/L Final   Comment: DELTA CHECK NOTED  REPEATED TO VERIFY                          NO VISIBLE HEMOLYSIS  . Chloride 06/07/2013 100  96 - 112 mEq/L Final  . CO2 06/07/2013 21  19 - 32 mEq/L Final  . Glucose, Bld 06/07/2013 187* 70 - 99 mg/dL Final  . BUN 06/07/2013 19  6 - 23 mg/dL Final  . Creatinine, Ser 06/07/2013 0.63  0.50 - 1.10 mg/dL Final  . Calcium 06/07/2013 9.3  8.4 - 10.5 mg/dL Final  . GFR calc non Af Amer 06/07/2013 84* >90 mL/min Final  . GFR calc Af Amer 06/07/2013 >90  >90 mL/min Final   Comment: (NOTE)                          The eGFR has been calculated using the CKD EPI equation.                          This calculation has not been validated in all clinical situations.                          eGFR's persistently <90 mL/min signify possible Chronic Kidney                          Disease.  . WBC 06/07/2013 9.2  4.0 - 10.5 K/uL Final  . RBC 06/07/2013 4.26  3.87 - 5.11 MIL/uL Final  . Hemoglobin 06/07/2013 13.0  12.0 - 15.0 g/dL Final  . HCT 06/07/2013 37.0  36.0 - 46.0 % Final  . MCV 06/07/2013 86.9  78.0 - 100.0 fL Final  . MCH 06/07/2013 30.5  26.0 - 34.0 pg Final  . MCHC 06/07/2013 35.1  30.0 - 36.0 g/dL Final  . RDW 06/07/2013 13.8  11.5 - 15.5 % Final  . Platelets 06/07/2013 321  150 - 400 K/uL Final  . WBC 06/08/2013 8.1  4.0 - 10.5 K/uL Final  . RBC 06/08/2013 3.88  3.87 - 5.11 MIL/uL Final  . Hemoglobin 06/08/2013 11.7* 12.0 - 15.0 g/dL Final  . HCT 06/08/2013 34.1*  36.0 - 46.0 % Final  . MCV 06/08/2013 87.9  78.0 - 100.0 fL Final  . MCH 06/08/2013 30.2  26.0 - 34.0 pg Final  . MCHC 06/08/2013 34.3  30.0 - 36.0 g/dL Final  . RDW 06/08/2013 13.9  11.5 - 15.5 % Final  . Platelets 06/08/2013 271  150 - 400 K/uL Final  . Sodium 06/08/2013 136  135 - 145 mEq/L Final  . Potassium 06/08/2013 4.2  3.5 - 5.1 mEq/L Final  . Chloride 06/08/2013 102  96 - 112 mEq/L Final  . CO2 06/08/2013 25  19 - 32 mEq/L Final  . Glucose, Bld 06/08/2013 132* 70 - 99 mg/dL Final  . BUN 06/08/2013 17  6 - 23 mg/dL Final  . Creatinine, Ser 06/08/2013 0.68  0.50 - 1.10 mg/dL Final  . Calcium 06/08/2013 8.9  8.4 - 10.5 mg/dL Final  . GFR calc non Af Amer 06/08/2013 82* >90 mL/min Final  . GFR calc Af Amer 06/08/2013 >90  >90 mL/min Final   Comment: (NOTE)                          The eGFR has been calculated using  the CKD EPI equation.                          This calculation has not been validated in all clinical situations.                          eGFR's persistently <90 mL/min signify possible Chronic Kidney                          Disease.  Marland Kitchen Hemoglobin A1C 06/08/2013 5.2  <5.7 % Final   Comment: (NOTE)                                                                                                                         According to the ADA Clinical Practice Recommendations for 2011, when                          HbA1c is used as a screening test:                           >=6.5%   Diagnostic of Diabetes Mellitus                                    (if abnormal result is confirmed)                          5.7-6.4%   Increased risk of developing Diabetes Mellitus                          References:Diagnosis and Classification of Diabetes Mellitus,Diabetes                          ZGYF,7494,49(QPRFF 1):S62-S69 and Standards of Medical Care in                                  Diabetes - 2011,Diabetes MBWG,6659,93 (Suppl 1):S11-S61.  . Mean Plasma Glucose 06/08/2013 103   <117 mg/dL Final   Performed at [facility]  . Vancomycin Tr 06/08/2013 12.4  10.0 - 20.0 ug/mL Final      Assessment/Plan  Acute upper respiratory infections  Likely viral. Try Delsym or Dayquil Sever Cold and Sinus with Dextromethorphan  DJD (degenerative joint disease)  NSAID is relatively contraindicated due to Eliquis  OTC: Aspercream, Myoflex, or Arnica  If desired later: Diclofenac gel

## 2013-12-28 DIAGNOSIS — M1710 Unilateral primary osteoarthritis, unspecified knee: Secondary | ICD-10-CM | POA: Diagnosis not present

## 2014-01-06 ENCOUNTER — Other Ambulatory Visit: Payer: Self-pay | Admitting: Geriatric Medicine

## 2014-01-13 ENCOUNTER — Other Ambulatory Visit: Payer: Self-pay | Admitting: Internal Medicine

## 2014-02-23 ENCOUNTER — Telehealth: Payer: Self-pay | Admitting: Cardiology

## 2014-02-23 NOTE — Telephone Encounter (Signed)
New Message:  Dentist office is calling.. States they need to extract tooth number 15... They want to know if the patient can hold off of her blood thinner for a couple days. Requests a call back from the nurse

## 2014-02-25 NOTE — Telephone Encounter (Signed)
Returned call to [facility] and [place] office. Dr.Jordan advised does not need to hold eliquis for one tooth extraction.

## 2014-02-28 ENCOUNTER — Other Ambulatory Visit: Payer: Self-pay | Admitting: Cardiology

## 2014-03-21 ENCOUNTER — Other Ambulatory Visit: Payer: Self-pay | Admitting: *Deleted

## 2014-03-21 ENCOUNTER — Other Ambulatory Visit: Payer: Self-pay | Admitting: Internal Medicine

## 2014-03-21 MED ORDER — ZOLPIDEM TARTRATE 10 MG PO TABS: ORAL_TABLET | ORAL | Status: DC

## 2014-04-27 DIAGNOSIS — M1710 Unilateral primary osteoarthritis, unspecified knee: Secondary | ICD-10-CM | POA: Diagnosis not present

## 2014-05-02 ENCOUNTER — Other Ambulatory Visit: Payer: Self-pay | Admitting: Cardiology

## 2014-05-10 DIAGNOSIS — I10 Essential (primary) hypertension: Secondary | ICD-10-CM | POA: Diagnosis not present

## 2014-05-10 DIAGNOSIS — I4891 Unspecified atrial fibrillation: Secondary | ICD-10-CM | POA: Diagnosis not present

## 2014-05-10 LAB — BASIC METABOLIC PANEL
BUN: 11 mg/dL (ref 4–21)
Creatinine: 0.7 mg/dL (ref 0.5–1.1)
GLUCOSE: 89 mg/dL
Potassium: 4.1 mmol/L (ref 3.4–5.3)
Sodium: 138 mmol/L (ref 137–147)

## 2014-05-10 LAB — HEPATIC FUNCTION PANEL
ALK PHOS: 88 U/L (ref 25–125)
ALT: 14 U/L (ref 7–35)
AST: 17 U/L (ref 13–35)
Bilirubin, Total: 0.6 mg/dL

## 2014-05-10 LAB — LIPID PANEL
CHOLESTEROL: 203 mg/dL — AB (ref 0–200)
HDL: 86 mg/dL — AB (ref 35–70)
LDL CALC: 96 mg/dL
Triglycerides: 158 mg/dL (ref 40–160)

## 2014-05-12 ENCOUNTER — Other Ambulatory Visit: Payer: Self-pay | Admitting: Internal Medicine

## 2014-05-16 ENCOUNTER — Encounter: Payer: Medicare Other | Admitting: Internal Medicine

## 2014-05-16 ENCOUNTER — Encounter: Payer: Self-pay | Admitting: Internal Medicine

## 2014-05-16 VITALS — BP 124/70 | HR 72 | Wt 185.0 lb

## 2014-05-16 DIAGNOSIS — Z299 Encounter for prophylactic measures, unspecified: Secondary | ICD-10-CM

## 2014-05-16 DIAGNOSIS — R110 Nausea: Secondary | ICD-10-CM

## 2014-05-16 DIAGNOSIS — I482 Chronic atrial fibrillation, unspecified: Secondary | ICD-10-CM

## 2014-05-16 DIAGNOSIS — R251 Tremor, unspecified: Secondary | ICD-10-CM

## 2014-05-25 ENCOUNTER — Other Ambulatory Visit: Payer: Self-pay | Admitting: Internal Medicine

## 2014-05-25 ENCOUNTER — Other Ambulatory Visit: Payer: Self-pay | Admitting: Geriatric Medicine

## 2014-06-07 DIAGNOSIS — M2040 Other hammer toe(s) (acquired), unspecified foot: Secondary | ICD-10-CM | POA: Diagnosis not present

## 2014-06-07 DIAGNOSIS — B351 Tinea unguium: Secondary | ICD-10-CM | POA: Diagnosis not present

## 2014-06-09 ENCOUNTER — Encounter: Payer: Self-pay | Admitting: Internal Medicine

## 2014-07-06 ENCOUNTER — Other Ambulatory Visit: Payer: Self-pay | Admitting: Internal Medicine

## 2014-07-06 DIAGNOSIS — Z23 Encounter for immunization: Secondary | ICD-10-CM | POA: Diagnosis not present

## 2014-07-18 ENCOUNTER — Other Ambulatory Visit: Payer: Self-pay | Admitting: Internal Medicine

## 2014-08-09 ENCOUNTER — Other Ambulatory Visit: Payer: Self-pay | Admitting: Internal Medicine

## 2014-08-09 ENCOUNTER — Other Ambulatory Visit: Payer: Self-pay | Admitting: *Deleted

## 2014-08-09 MED ORDER — ZOLPIDEM TARTRATE 5 MG PO TABS: 5.0000 mg | ORAL_TABLET | Freq: Every evening | ORAL | Status: DC | PRN

## 2014-08-09 NOTE — Telephone Encounter (Signed)
[facility] 

## 2014-08-11 DIAGNOSIS — M170 Bilateral primary osteoarthritis of knee: Secondary | ICD-10-CM | POA: Diagnosis not present

## 2014-08-26 ENCOUNTER — Other Ambulatory Visit: Payer: Self-pay | Admitting: Internal Medicine

## 2014-09-05 ENCOUNTER — Other Ambulatory Visit: Payer: Self-pay | Admitting: Internal Medicine

## 2014-09-09 DIAGNOSIS — L814 Other melanin hyperpigmentation: Secondary | ICD-10-CM | POA: Diagnosis not present

## 2014-09-09 DIAGNOSIS — L2081 Atopic neurodermatitis: Secondary | ICD-10-CM | POA: Diagnosis not present

## 2014-09-09 DIAGNOSIS — L578 Other skin changes due to chronic exposure to nonionizing radiation: Secondary | ICD-10-CM | POA: Diagnosis not present

## 2014-10-11 ENCOUNTER — Other Ambulatory Visit: Payer: Self-pay | Admitting: Internal Medicine

## 2014-10-19 ENCOUNTER — Other Ambulatory Visit: Payer: Self-pay | Admitting: Internal Medicine

## 2014-10-19 ENCOUNTER — Other Ambulatory Visit: Payer: Self-pay | Admitting: Cardiology

## 2014-10-19 ENCOUNTER — Encounter: Payer: Medicare Other | Admitting: Nurse Practitioner

## 2014-10-26 ENCOUNTER — Non-Acute Institutional Stay: Payer: Medicare Other | Admitting: Nurse Practitioner

## 2014-10-26 ENCOUNTER — Encounter: Payer: Self-pay | Admitting: Nurse Practitioner

## 2014-10-26 VITALS — BP 132/76 | HR 80 | Temp 97.6°F | Wt 193.0 lb

## 2014-10-26 DIAGNOSIS — G4700 Insomnia, unspecified: Secondary | ICD-10-CM

## 2014-10-26 DIAGNOSIS — R61 Generalized hyperhidrosis: Secondary | ICD-10-CM

## 2014-10-26 DIAGNOSIS — I482 Chronic atrial fibrillation, unspecified: Secondary | ICD-10-CM

## 2014-10-26 DIAGNOSIS — R251 Tremor, unspecified: Secondary | ICD-10-CM | POA: Diagnosis not present

## 2014-10-26 DIAGNOSIS — I10 Essential (primary) hypertension: Secondary | ICD-10-CM

## 2014-10-26 NOTE — Progress Notes (Signed)
Patient ID: [name], female   DOB: February 28, 1933, 81 y.o.   MRN: [id number]    Nursing Home Location:  [place] of Service: Clinic (12)  PCP: Estill Dooms, MD  Allergies  Allergen Reactions  . Mirtazapine     tremor  . Nickel Other (See Comments)    inflammation  . Other     Prednisone causes sleep disturbances    Chief Complaint  Patient presents with  . Medical Management of Chronic Issues    multiple problems: insomina, tremor has returned in 06/08/13 Dx with Meningitis in the hospital. Night sweats, nausea, cough - dry .     HPI:  Patient is a 81 y.o. female seen today at [facility] who presents today complaining of insomnia, tremor and night sweats. 1. Insomnia - Patient reports that she can fall asleep but cannot stay asleep.  She gets up multiple times per night to use the bathroom.  She currently is on 5mg  of Ambien and takes this every night.  Reports sometimes she will fall back to sleep quickly other times she does not  2. Night sweats - Patient reports that this started a few months ago.  It happens most nights.  Denies fever. She keeps the heat on 70 at night. 3. Tremor- Began a few months ago.  She notices it mostly in her hands and jaw.  She is still able to feed herself and preform her ADL's.  She had this in 2014 and did not receive treatment for it.  She describes the tremor as fine.    Pt reports her tremor ordinally started before dx with meninginitis which was in 2014. Reports she was seen by a neurologist which did not do anything for it.  Review of Systems:  Review of Systems  Constitutional: Negative for fever, chills and activity change.  HENT: Negative for congestion, postnasal drip and sinus pressure.   Respiratory: Negative for cough, shortness of breath and wheezing.   Cardiovascular: Negative for chest pain, palpitations and leg swelling.  Musculoskeletal: Negative for back pain, joint swelling  and gait problem.  Skin: Negative for color change, pallor, rash and wound.  Neurological: Positive for tremors. Negative for dizziness, light-headedness and headaches.    Past Medical History  Diagnosis Date  . A-fib   . Hypertension   . Arthritis   . Cataracts, bilateral   . Other acariasis     peripherial neuropathy  . History of stomach ulcers   . GI bleed 06/2002  . UTI (lower urinary tract infection)     pt state uti w/ no pain  . PVC (premature ventricular contraction)   . Migraines   . Abdominal pain, unspecified site   . Diverticulosis of colon (without mention of hemorrhage)   . Hemorrhage of gastrointestinal tract, unspecified   . Unspecified hereditary and idiopathic peripheral neuropathy   . Osteoarthrosis, unspecified whether generalized or localized, lower leg   . Palpitations   . Depression   . Other and unspecified alcohol dependence, continuous drinking behavior 10/28/2012  . Nausea alone 10/28/2012  . Obesity, unspecified 10/27/2012  . Diverticulosis of colon (without mention of hemorrhage) 10/27/2012  . Insomnia, unspecified 10/27/2012  . Edema 10/27/2012  . Localized osteoarthrosis not specified whether primary or secondary, lower leg 05/31/2011  . Abnormality of gait 06/02/2013   Past Surgical History  Procedure Laterality Date  . Breast reduction surgery  12/23/2003  . Cholecystectomy  2005  . Cataracts    .  Arthroscopic repair acl    . Tonsillectomy and adenoidectomy  1941  . Breast biopsy  1973    left   Social History:   reports that she quit smoking about 31 years ago. Her smoking use included Cigarettes. She has never used smokeless tobacco. She reports that she drinks alcohol. She reports that she does not use illicit drugs.  History reviewed. No pertinent family history.  Medications: Patient's Medications  New Prescriptions   No medications on file  Previous Medications   ACETAMINOPHEN (TYLENOL) 325 MG TABLET    Take 650 mg by mouth 2 (two) times  daily as needed. For pain   CARTIA XT 180 MG 24 HR CAPSULE    TAKE ONE CAPSULE BY MOUTH TWICE DAILY   ELIQUIS 5 MG TABS TABLET    TAKE 1 TABLET BY MOUTH TWICE DAILY   FEEDING SUPPLEMENT (ENSURE COMPLETE) LIQD    Take 237 mLs by mouth 2 (two) times daily between meals.   FLECAINIDE (TAMBOCOR) 50 MG TABLET    One tablet twice daily to control atrial fibrillation   HYDROXYZINE (ATARAX/VISTARIL) 10 MG TABLET    TAKE 1 TABLET BY MOUTH UP TO 4 TIMES DAILY IF NEEDED TO CONTROL ITCHING   LOSARTAN (COZAAR) 100 MG TABLET    TAKE 1 TABLET BY MOUTH EVERY DAY   METOPROLOL SUCCINATE (TOPROL-XL) 50 MG 24 HR TABLET    TAKE 1 TABLET BY MOUTH DAILY   PROMETHAZINE (PHENERGAN) 25 MG TABLET    Take 25 mg by mouth daily as needed. For nausea and vomiting   ZOLPIDEM (AMBIEN) 5 MG TABLET    TAKE 1 TABLET BY MOUTH AT BEDTIME AS NEEDED FOR SLEEP  Modified Medications   No medications on file  Discontinued Medications   CHOLESTYRAMINE (QUESTRAN) 4 GM/DOSE POWDER    Take 4 grams 3 times daily to help naussea.     Physical Exam: Filed Vitals:   10/26/14 1320  BP: 132/76  Pulse: 80  Temp: 97.6 F (36.4 C)  TempSrc: Oral  Weight: 193 lb (87.544 kg)  SpO2: 95%    Physical Exam  Constitutional: She is oriented to person, place, and time. She appears well-developed and well-nourished.  HENT:  Head: Normocephalic and atraumatic.  Neck: Normal range of motion.  Cardiovascular: Normal rate, regular rhythm and normal heart sounds.   Pulmonary/Chest: Effort normal and breath sounds normal.  Lymphadenopathy:    She has no cervical adenopathy.  Neurological: She is alert and oriented to person, place, and time. She has normal reflexes.  Fine tremor present bilaterally in hands when arms are extended.  No tremor noted in jaw.   Skin: Skin is warm and dry.    Labs reviewed: Basic Metabolic Panel:  Recent Labs  05/10/14  NA 138  K 4.1  BUN 11  CREATININE 0.7   Liver Function Tests:  Recent Labs  05/10/14   AST 17  ALT 14  ALKPHOS 88   No results for input(s): LIPASE, AMYLASE in the last 8760 hours. No results for input(s): AMMONIA in the last 8760 hours. CBC: No results for input(s): WBC, NEUTROABS, HGB, HCT, MCV, PLT in the last 8760 hours. TSH: No results for input(s): TSH in the last 8760 hours. A1C: Lab Results  Component Value Date   HGBA1C 5.2 06/08/2013   Lipid Panel:  Recent Labs  05/10/14  CHOL 203*  HDL 86*  LDLCALC 96  TRIG 158      Assessment/Plan 1. Tremor - Will obtain TSH, b12,  and folate. Not affecting ADL's.has seen neurology in the past, Will continue to monitor.  2. Hypertension - Will obtain a CMP  Blood pressure today is at goal.  Continue on losartan, metoprolol.   3. Afib - Rate controlled on current medications.  Continues on eliquis for anticoagulation.  Will follow up CBC.  She questions if she needs to be on current medication.  most likely needs to be on this but will defer to cardiology for further recs.   4. Insomnia- will cont current medication  5. Night sweats- only after deep sleep- recommended to have less sheets on bed or turning down the heat at night  Labs/tests ordered Cbc, cmp, tsh, b12, folate.

## 2014-11-01 DIAGNOSIS — R739 Hyperglycemia, unspecified: Secondary | ICD-10-CM | POA: Diagnosis not present

## 2014-11-01 DIAGNOSIS — E876 Hypokalemia: Secondary | ICD-10-CM | POA: Diagnosis not present

## 2014-11-01 DIAGNOSIS — R251 Tremor, unspecified: Secondary | ICD-10-CM | POA: Diagnosis not present

## 2014-11-01 DIAGNOSIS — I10 Essential (primary) hypertension: Secondary | ICD-10-CM | POA: Diagnosis not present

## 2014-11-01 DIAGNOSIS — I482 Chronic atrial fibrillation: Secondary | ICD-10-CM | POA: Diagnosis not present

## 2014-11-11 ENCOUNTER — Other Ambulatory Visit: Payer: Self-pay | Admitting: Internal Medicine

## 2014-11-22 ENCOUNTER — Encounter: Payer: Self-pay | Admitting: Internal Medicine

## 2014-12-02 ENCOUNTER — Emergency Department (HOSPITAL_COMMUNITY): Payer: Medicare Other

## 2014-12-02 ENCOUNTER — Encounter (HOSPITAL_COMMUNITY): Payer: Self-pay | Admitting: *Deleted

## 2014-12-02 ENCOUNTER — Emergency Department (HOSPITAL_COMMUNITY)
Admission: EM | Admit: 2014-12-02 | Discharge: 2014-12-02 | Disposition: A | Discharge status: Home / Self Care | Payer: Medicare Other | Attending: Emergency Medicine | Admitting: Emergency Medicine

## 2014-12-02 DIAGNOSIS — Z9841 Cataract extraction status, right eye: Secondary | ICD-10-CM | POA: Insufficient documentation

## 2014-12-02 DIAGNOSIS — Z8719 Personal history of other diseases of the digestive system: Secondary | ICD-10-CM | POA: Diagnosis not present

## 2014-12-02 DIAGNOSIS — I10 Essential (primary) hypertension: Secondary | ICD-10-CM | POA: Diagnosis not present

## 2014-12-02 DIAGNOSIS — T148 Other injury of unspecified body region: Secondary | ICD-10-CM | POA: Diagnosis not present

## 2014-12-02 DIAGNOSIS — I4891 Unspecified atrial fibrillation: Secondary | ICD-10-CM | POA: Insufficient documentation

## 2014-12-02 DIAGNOSIS — T148XXA Other injury of unspecified body region, initial encounter: Secondary | ICD-10-CM

## 2014-12-02 DIAGNOSIS — Z7902 Long term (current) use of antithrombotics/antiplatelets: Secondary | ICD-10-CM | POA: Diagnosis not present

## 2014-12-02 DIAGNOSIS — Y998 Other external cause status: Secondary | ICD-10-CM | POA: Diagnosis not present

## 2014-12-02 DIAGNOSIS — M79662 Pain in left lower leg: Secondary | ICD-10-CM | POA: Diagnosis not present

## 2014-12-02 DIAGNOSIS — N390 Urinary tract infection, site not specified: Secondary | ICD-10-CM | POA: Diagnosis not present

## 2014-12-02 DIAGNOSIS — M179 Osteoarthritis of knee, unspecified: Secondary | ICD-10-CM | POA: Insufficient documentation

## 2014-12-02 DIAGNOSIS — S3992XA Unspecified injury of lower back, initial encounter: Secondary | ICD-10-CM | POA: Diagnosis not present

## 2014-12-02 DIAGNOSIS — Z79899 Other long term (current) drug therapy: Secondary | ICD-10-CM | POA: Diagnosis not present

## 2014-12-02 DIAGNOSIS — S0990XA Unspecified injury of head, initial encounter: Secondary | ICD-10-CM | POA: Insufficient documentation

## 2014-12-02 DIAGNOSIS — M533 Sacrococcygeal disorders, not elsewhere classified: Secondary | ICD-10-CM | POA: Diagnosis not present

## 2014-12-02 DIAGNOSIS — Y9389 Activity, other specified: Secondary | ICD-10-CM | POA: Insufficient documentation

## 2014-12-02 DIAGNOSIS — Y9289 Other specified places as the place of occurrence of the external cause: Secondary | ICD-10-CM | POA: Diagnosis not present

## 2014-12-02 DIAGNOSIS — W19XXXA Unspecified fall, initial encounter: Secondary | ICD-10-CM

## 2014-12-02 DIAGNOSIS — Z9842 Cataract extraction status, left eye: Secondary | ICD-10-CM | POA: Diagnosis not present

## 2014-12-02 DIAGNOSIS — E669 Obesity, unspecified: Secondary | ICD-10-CM | POA: Insufficient documentation

## 2014-12-02 DIAGNOSIS — S8992XA Unspecified injury of left lower leg, initial encounter: Secondary | ICD-10-CM | POA: Diagnosis not present

## 2014-12-02 DIAGNOSIS — G43909 Migraine, unspecified, not intractable, without status migrainosus: Secondary | ICD-10-CM | POA: Diagnosis not present

## 2014-12-02 DIAGNOSIS — G4700 Insomnia, unspecified: Secondary | ICD-10-CM | POA: Diagnosis not present

## 2014-12-02 DIAGNOSIS — Z8659 Personal history of other mental and behavioral disorders: Secondary | ICD-10-CM | POA: Diagnosis not present

## 2014-12-02 DIAGNOSIS — B9689 Other specified bacterial agents as the cause of diseases classified elsewhere: Secondary | ICD-10-CM | POA: Diagnosis not present

## 2014-12-02 DIAGNOSIS — W1839XA Other fall on same level, initial encounter: Secondary | ICD-10-CM | POA: Diagnosis not present

## 2014-12-02 DIAGNOSIS — Z87891 Personal history of nicotine dependence: Secondary | ICD-10-CM | POA: Insufficient documentation

## 2014-12-02 DIAGNOSIS — S300XXA Contusion of lower back and pelvis, initial encounter: Secondary | ICD-10-CM | POA: Diagnosis not present

## 2014-12-02 LAB — URINE MICROSCOPIC-ADD ON

## 2014-12-02 LAB — URINALYSIS, ROUTINE W REFLEX MICROSCOPIC
BILIRUBIN URINE: NEGATIVE
GLUCOSE, UA: NEGATIVE mg/dL
KETONES UR: NEGATIVE mg/dL
NITRITE: POSITIVE — AB
Protein, ur: NEGATIVE mg/dL
Specific Gravity, Urine: 1.012 (ref 1.005–1.030)
Urobilinogen, UA: 0.2 mg/dL (ref 0.0–1.0)
pH: 6 (ref 5.0–8.0)

## 2014-12-02 MED ORDER — HYDROCODONE-ACETAMINOPHEN 5-325 MG PO TABS
1.0000 | ORAL_TABLET | Freq: Once | ORAL | Status: AC
  Administered 2014-12-02: 1 via ORAL
  Filled 2014-12-02: qty 1

## 2014-12-02 MED ORDER — CEPHALEXIN 500 MG PO CAPS: 500.0000 mg | ORAL_CAPSULE | Freq: Two times a day (BID) | ORAL | Status: DC

## 2014-12-02 MED ORDER — HYDROCODONE-ACETAMINOPHEN 5-325 MG PO TABS
1.0000 | ORAL_TABLET | Freq: Four times a day (QID) | ORAL | Status: DC | PRN
Start: 2014-12-02 — End: 2014-12-09

## 2014-12-02 MED ORDER — CEPHALEXIN 500 MG PO CAPS
500.0000 mg | ORAL_CAPSULE | Freq: Once | ORAL | Status: AC
  Administered 2014-12-02: 500 mg via ORAL
  Filled 2014-12-02: qty 1

## 2014-12-02 NOTE — ED Notes (Signed)
Bed: [facility] Expected date:  Expected time:  Means of arrival:  Comments: EMS-fall 

## 2014-12-02 NOTE — ED Provider Notes (Signed)
CSN: [id number]     Arrival date & time 12/02/14  1021 History   First MD Initiated Contact with Patient 12/02/14 1132     Chief Complaint  Patient presents with  . Fall     (Consider location/radiation/quality/duration/timing/severity/associated sxs/prior Treatment) HPI Comments: Pt comes in with cc of fall. PMHx of afib, on eliquis. She also has hx of bilateral knee arthritis. Patient recalls getting up at 1 to urinate, and fell down. She doesn't remember falling - but woke up in the middle of the night and went to bed again. Pt thinks that it was [place] related fall and lack of memory. She has a mild headache. She woke up, and had increased pain in the L buttock region and tail bone. No nausea, vomiting, visual complains, seizures, altered mental status, loss of consciousness, new weakness, or numbness, no gait instability.   Patient is a 81 y.o. female presenting with fall. The history is provided by the patient.  Fall Pertinent negatives include no chest pain, no abdominal pain, no headaches and no shortness of breath.    Past Medical History  Diagnosis Date  . A-fib   . Hypertension   . Arthritis   . Cataracts, bilateral   . Other acariasis     peripherial neuropathy  . History of stomach ulcers   . GI bleed 06/2002  . UTI (lower urinary tract infection)     pt state uti w/ no pain  . PVC (premature ventricular contraction)   . Migraines   . Abdominal pain, unspecified site   . Diverticulosis of colon (without mention of hemorrhage)   . Hemorrhage of gastrointestinal tract, unspecified   . Unspecified hereditary and idiopathic peripheral neuropathy   . Osteoarthrosis, unspecified whether generalized or localized, lower leg   . Palpitations   . Depression   . Other and unspecified alcohol dependence, continuous drinking behavior 10/28/2012  . Nausea alone 10/28/2012  . Obesity, unspecified 10/27/2012  . Diverticulosis of colon (without mention of hemorrhage) 10/27/2012  .  Insomnia, unspecified 10/27/2012  . Edema 10/27/2012  . Localized osteoarthrosis not specified whether primary or secondary, lower leg 05/31/2011  . Abnormality of gait 06/02/2013   Past Surgical History  Procedure Laterality Date  . Breast reduction surgery  12/23/2003  . Cholecystectomy  2005  . Cataracts    . Arthroscopic repair acl    . Tonsillectomy and adenoidectomy  1941  . Breast biopsy  1973    left   No family history on file. History  Substance Use Topics  . Smoking status: Former Smoker    Types: Cigarettes    Quit date: 10/09/1983  . Smokeless tobacco: Never Used  . Alcohol Use: 0.0 oz/week    1-2 Shots of liquor per week     Comment: daily  scotch    OB History    No data available     Review of Systems  Constitutional: Negative for activity change.  Respiratory: Negative for shortness of breath.   Cardiovascular: Negative for chest pain.  Gastrointestinal: Negative for nausea, vomiting and abdominal pain.  Genitourinary: Negative for dysuria.  Musculoskeletal: Positive for myalgias and arthralgias. Negative for neck pain.  Neurological: Negative for headaches.  Hematological: Bruises/bleeds easily.      Allergies  Mirtazapine; Nickel; and Other  Home Medications   Prior to Admission medications   Medication Sig Start Date End Date Taking? Authorizing Provider  acetaminophen (TYLENOL) 325 MG tablet Take 650 mg by mouth 2 (two) times daily as  needed. For pain   Yes Historical Provider, MD  CARTIA XT 180 MG 24 hr capsule TAKE ONE CAPSULE BY MOUTH TWICE DAILY Patient taking differently: take one tablet by  mouth once daily. 10/20/14  Yes Peter M Martinique, MD  ELIQUIS 5 MG TABS tablet TAKE 1 TABLET BY MOUTH TWICE DAILY 10/19/14  Yes Estill Dooms, MD  flecainide (TAMBOCOR) 50 MG tablet TAKE 1 TABLET BY MOUTH TWICE DAILY TO CONTROL ATRIAL FIBRILLATION 11/11/14  Yes Tiffany L Reed, DO  hydrOXYzine (ATARAX/VISTARIL) 10 MG tablet TAKE 1 TABLET BY MOUTH UP TO 4 TIMES  DAILY IF NEEDED TO CONTROL ITCHING 05/25/14  Yes Mahima Pandey, MD  losartan (COZAAR) 100 MG tablet TAKE 1 TABLET BY MOUTH EVERY DAY 11/11/14  Yes Tiffany L Reed, DO  metoprolol succinate (TOPROL-XL) 50 MG 24 hr tablet TAKE 1 TABLET BY MOUTH DAILY 10/19/14  Yes Tiffany L Reed, DO  Multiple Vitamin (MULTIVITAMIN WITH MINERALS) TABS tablet Take 1 tablet by mouth daily.   Yes Historical Provider, MD  promethazine (PHENERGAN) 25 MG tablet Take 25 mg by mouth daily as needed. For nausea and vomiting   Yes Historical Provider, MD  sodium chloride (OCEAN) 0.65 % SOLN nasal spray Place 1 spray into both nostrils daily as needed for congestion.   Yes Historical Provider, MD  zolpidem (AMBIEN) 5 MG tablet TAKE 1 TABLET BY MOUTH AT BEDTIME AS NEEDED FOR SLEEP 10/11/14  Yes Tiffany L Reed, DO  feeding supplement (ENSURE COMPLETE) LIQD Take 237 mLs by mouth 2 (two) times daily between meals. Patient not taking: Reported on 12/02/2014 06/08/13   Eugenie Filler, MD   BP 154/72 mmHg  Pulse 97  Temp(Src) 97.9 F (36.6 C) (Oral)  Resp 18  SpO2 97% Physical Exam  Constitutional: She is oriented to person, place, and time. She appears well-developed and well-nourished.  HENT:  Head: Normocephalic and atraumatic.  Eyes: Conjunctivae and EOM are normal. Pupils are equal, round, and reactive to light.  Neck: Normal range of motion. Neck supple.  No midline c-spine tenderness  Cardiovascular: Normal rate and regular rhythm.   No murmur heard. Pulmonary/Chest: Effort normal and breath sounds normal. No respiratory distress. She exhibits no tenderness.  Abdominal: Soft. Bowel sounds are normal. She exhibits no distension. There is no tenderness. There is no rebound and no guarding.  Musculoskeletal:  No long bone tenderness - upper and lower extrmeities and no pelvic pain, instability.  Neurological: She is alert and oriented to person, place, and time. No cranial nerve deficit.  Skin: Skin is warm and dry. No rash  noted.  Nursing note and vitals reviewed.   ED Course  Procedures (including critical care time) Labs Review Labs Reviewed  URINALYSIS, ROUTINE W REFLEX MICROSCOPIC - Abnormal; Notable for the following:    APPearance CLOUDY (*)    Hgb urine dipstick TRACE (*)    Nitrite POSITIVE (*)    Leukocytes, UA SMALL (*)    All other components within normal limits  URINE MICROSCOPIC-ADD ON - Abnormal; Notable for the following:    Bacteria, UA MANY (*)    All other components within normal limits  URINE CULTURE    Imaging Review Dg Sacrum/coccyx  12/02/2014   CLINICAL DATA:  Fall onto buttocks. Pain along coccyx. Left lower leg pain.  EXAM: SACRUM AND COCCYX - 2+ VIEW  COMPARISON:  None.  FINDINGS: Bony demineralization reduces diagnostic sensitivity.  The visualized sacral arcuate lines appear intact. Sacral cortical margin indistinct on the frontal  projections due to bony demineralization. No widening of the sacroiliac joints.  On the lateral projection, no obvious unexpected bony discontinuity is observed to favor fracture.  Aortoiliac atherosclerotic vascular disease. Lower lumbar facet arthropathy noted.  IMPRESSION: 1. No acute bony findings. Reduced sensitivity due to bony demineralization. 2. Lower lumbar facet arthropathy. 3.  Aortoiliac atherosclerotic vascular disease.   Electronically Signed   By: Van Clines M.D.   On: 12/02/2014 12:12   Dg Tibia/fibula Left  12/02/2014   CLINICAL DATA:  Fall.  Left lower leg pain  EXAM: LEFT TIBIA AND FIBULA - 2 VIEW  COMPARISON:  None.  FINDINGS: There is no evidence of fracture or other focal bone lesions. Advanced changes of osteoarthritis are noted within the knee. Noted Soft tissues are unremarkable.  IMPRESSION: 1. No acute findings. 2. Osteoarthritis.   Electronically Signed   By: Kerby Moors M.D.   On: 12/02/2014 12:12     EKG Interpretation None      MDM   Final diagnoses:  UTI (lower urinary tract infection)  Fall,  initial encounter  Contusion  Fall    DDx includes: - Mechanical falls - ICH - Fractures - Contusions - Soft tissue injury  Pt on eliquis comes in with fall. Pt thinks fall is due [place]. I am not sure. She was on the floor for a couple of hours, woke up and went to sleep again. Pt is on anticoagulation and has a headache - so CT head ordered, and is neg. Pt has a UTI. Keflex given here. She has ambulated.   Varney Biles, MD 12/02/14 1538

## 2014-12-02 NOTE — Discharge Instructions (Signed)
We saw you in the ER after you had a fall. All the imaging results are normal, no fractures seen. No evidence of brain bleed. Please be very careful with walking, and do everything possible to prevent falls.  Also, there is a urinary infection - take the meds prescribed.   Contusion A contusion is a deep bruise. Contusions are the result of an injury that caused bleeding under the skin. The contusion may turn blue, purple, or yellow. Minor injuries will give you a painless contusion, but more severe contusions may stay painful and swollen for a few weeks.  CAUSES  A contusion is usually caused by a blow, trauma, or direct force to an area of the body. SYMPTOMS   Swelling and redness of the injured area.  Bruising of the injured area.  Tenderness and soreness of the injured area.  Pain. DIAGNOSIS  The diagnosis can be made by taking a history and physical exam. An X-ray, CT scan, or MRI may be needed to determine if there were any associated injuries, such as fractures. TREATMENT  Specific treatment will depend on what area of the body was injured. In general, the best treatment for a contusion is resting, icing, elevating, and applying cold compresses to the injured area. Over-the-counter medicines may also be recommended for pain control. Ask your caregiver what the best treatment is for your contusion. HOME CARE INSTRUCTIONS   Put ice on the injured area.  Put ice in a plastic bag.  Place a towel between your skin and the bag.  Leave the ice on for 15-20 minutes, 3-4 times a day, or as directed by your health care provider.  Only take over-the-counter or prescription medicines for pain, discomfort, or fever as directed by your caregiver. Your caregiver may recommend avoiding anti-inflammatory medicines (aspirin, ibuprofen, and naproxen) for 48 hours because these medicines may increase bruising.  Rest the injured area.  If possible, elevate the injured area to reduce  swelling. SEEK IMMEDIATE MEDICAL CARE IF:   You have increased bruising or swelling.  You have pain that is getting worse.  Your swelling or pain is not relieved with medicines. MAKE SURE YOU:   Understand these instructions.  Will watch your condition.  Will get help right away if you are not doing well or get worse. Document Released: 06/19/2005 Document Revised: 09/14/2013 Document Reviewed: 07/15/2011 [facility] Patient Information 2015 [place], [place]. This information is not intended to replace advice given to you by your health care provider. Make sure you discuss any questions you have with your health care provider.  Urinary Tract Infection Urinary tract infections (UTIs) can develop anywhere along your urinary tract. Your urinary tract is your body's drainage system for removing wastes and extra water. Your urinary tract includes two kidneys, two ureters, a bladder, and a urethra. Your kidneys are a pair of bean-shaped organs. Each kidney is about the size of your fist. They are located below your ribs, one on each side of your spine. CAUSES Infections are caused by microbes, which are microscopic organisms, including fungi, viruses, and bacteria. These organisms are so small that they can only be seen through a microscope. Bacteria are the microbes that most commonly cause UTIs. SYMPTOMS  Symptoms of UTIs may vary by age and gender of the patient and by the location of the infection. Symptoms in young women typically include a frequent and intense urge to urinate and a painful, burning feeling in the bladder or urethra during urination. Older women and men  are more likely to be tired, shaky, and weak and have muscle aches and abdominal pain. A fever may mean the infection is in your kidneys. Other symptoms of a kidney infection include pain in your back or sides below the ribs, nausea, and vomiting. DIAGNOSIS To diagnose a UTI, your caregiver will ask you about your symptoms. Your  caregiver also will ask to provide a urine sample. The urine sample will be tested for bacteria and white blood cells. White blood cells are made by your body to help fight infection. TREATMENT  Typically, UTIs can be treated with medication. Because most UTIs are caused by a bacterial infection, they usually can be treated with the use of antibiotics. The choice of antibiotic and length of treatment depend on your symptoms and the type of bacteria causing your infection. HOME CARE INSTRUCTIONS  If you were prescribed antibiotics, take them exactly as your caregiver instructs you. Finish the medication even if you feel better after you have only taken some of the medication.  Drink enough water and fluids to keep your urine clear or pale yellow.  Avoid caffeine, tea, and carbonated beverages. They tend to irritate your bladder.  Empty your bladder often. Avoid holding urine for long periods of time.  Empty your bladder before and after sexual intercourse.  After a bowel movement, women should cleanse from front to back. Use each tissue only once. SEEK MEDICAL CARE IF:   You have back pain.  You develop a fever.  Your symptoms do not begin to resolve within 3 days. SEEK IMMEDIATE MEDICAL CARE IF:   You have severe back pain or lower abdominal pain.  You develop chills.  You have nausea or vomiting.  You have continued burning or discomfort with urination. MAKE SURE YOU:   Understand these instructions.  Will watch your condition.  Will get help right away if you are not doing well or get worse. Document Released: 06/19/2005 Document Revised: 03/10/2012 Document Reviewed: 10/18/2011 [facility] Patient Information 2015 [place], [place]. This information is not intended to replace advice given to you by your health care provider. Make sure you discuss any questions you have with your health care provider.

## 2014-12-02 NOTE — ED Notes (Addendum)
Pt able to ambulate in hall with walker, which is baseline for home. Pt sts she feels comfortable with her ability to ambulate and going home. Sts her daughter will be transporting her home. Dr. Kathrynn Humble made aware of same.

## 2014-12-02 NOTE — ED Notes (Signed)
Pt reports taking ambien and thinks she may have gotten up during the night and fallen. C/o buttock and coccyx pain. Denies head pain, Hx UTI as well.

## 2014-12-05 ENCOUNTER — Telehealth: Payer: Self-pay | Admitting: Cardiology

## 2014-12-05 ENCOUNTER — Encounter: Payer: Self-pay | Admitting: Internal Medicine

## 2014-12-05 LAB — URINE CULTURE

## 2014-12-05 NOTE — Telephone Encounter (Signed)
Returned call to patient she stated she had a fall this past Fri 12/02/14.Stated she went to [employer] .Stated she bruised her buttocks.Stated she has appointment scheduled with Dr.Jordan 01/31/15,but would like to be seen sooner.Stated she thinks she is taking too many medications.Appointment scheduled with Dr.Jordan 12/09/14 9:30 am.

## 2014-12-05 NOTE — Telephone Encounter (Signed)
New Message   Pt called states that she fell on her bottom and she says that she is concerned about the bleeding to the tissue and extreme pain from sitting down. She understands that this is not a cardiac matter but she is concerned about the drug interreactions of eliquis. Please call back to discuss

## 2014-12-05 NOTE — Telephone Encounter (Signed)
New Message  Pt called states that she feel on her bottom. She is concerned about her Eliquis and that she is bleeding to the tissue. Please call back to discuss.

## 2014-12-06 ENCOUNTER — Telehealth (HOSPITAL_BASED_OUTPATIENT_CLINIC_OR_DEPARTMENT_OTHER): Payer: Self-pay | Admitting: Emergency Medicine

## 2014-12-06 ENCOUNTER — Encounter: Payer: Self-pay | Admitting: Internal Medicine

## 2014-12-06 ENCOUNTER — Ambulatory Visit: Payer: Medicare Other | Admitting: Internal Medicine

## 2014-12-06 NOTE — Telephone Encounter (Signed)
Post ED Visit - Positive Culture Follow-up  Culture report reviewed by antimicrobial stewardship pharmacist: []  Wes Dulaney, Pharm.D., BCPS [x]  Heide Guile, Pharm.D., BCPS []  Alycia Rossetti, Pharm.D., BCPS []  [place], Pharm.D., BCPS, AAHIVP []  Legrand Como, Pharm.D., BCPS, AAHIVP []  Isac Sarna, Pharm.D., BCPS  Positive urine culture E. Coli Treated with cephalexin, organism sensitive to the same and no further patient follow-up is required at this time.  Hazle Nordmann 12/06/2014, 12:25 PM

## 2014-12-09 ENCOUNTER — Encounter: Payer: Self-pay | Admitting: Cardiology

## 2014-12-09 ENCOUNTER — Ambulatory Visit (INDEPENDENT_AMBULATORY_CARE_PROVIDER_SITE_OTHER): Payer: Medicare Other | Admitting: Cardiology

## 2014-12-09 VITALS — BP 126/76 | HR 80 | Ht 67.5 in | Wt 194.4 lb

## 2014-12-09 DIAGNOSIS — I10 Essential (primary) hypertension: Secondary | ICD-10-CM

## 2014-12-09 DIAGNOSIS — I480 Paroxysmal atrial fibrillation: Secondary | ICD-10-CM

## 2014-12-09 MED ORDER — HYDROCODONE-ACETAMINOPHEN 5-325 MG PO TABS: 1.0000 | ORAL_TABLET | Freq: Four times a day (QID) | ORAL | Status: DC | PRN

## 2014-12-09 NOTE — Patient Instructions (Signed)
Decrease Toprol to 25 mg daily for 5 days then 12.5 mg daily for 5 days. Then stop  Continue your other therapy  I will see you in 6 months.

## 2014-12-09 NOTE — Progress Notes (Signed)
Dorothea Glassman Date of Birth: 07-25-1933 Medical Record #[id number]  History of Present Illness: Mrs. Wuebker is seen for follow up of atrial fibrillation. She initially was diagnosed with Afib in 2013 and converted spontaneously. She had recurrence in Jan 2014 and converted on Flecainide. She has been managed with flecainide, metoprolol, and diltiazem. She is also anticoagulated with Eliquis. She has done very well from a cardiac standpoint without any recurrent symptoms of atrial fibrillation. Recently she suffered a fall on 12/02/14. The cause of the fall is unknown. She has no recollection of it. She had a normal head CT. She did have a hematoma in her right buttocks and a small hematoma on the top of her scalp. She was in NSR. She had a UTI and was treated with Keflex. She still complains of a lot of pain in the buttocks. She is weak she did have a dry cough that improved with antibiotics. She does note night sweats.   Current Outpatient Prescriptions on File Prior to Visit  Medication Sig Dispense Refill  . acetaminophen (TYLENOL) 325 MG tablet Take 650 mg by mouth 2 (two) times daily as needed. For pain    . cephALEXin (KEFLEX) 500 MG capsule Take 1 capsule (500 mg total) by mouth 2 (two) times daily. 28 capsule 0  . ELIQUIS 5 MG TABS tablet TAKE 1 TABLET BY MOUTH TWICE DAILY 180 tablet 0  . feeding supplement (ENSURE COMPLETE) LIQD Take 237 mLs by mouth 2 (two) times daily between meals.    . flecainide (TAMBOCOR) 50 MG tablet TAKE 1 TABLET BY MOUTH TWICE DAILY TO CONTROL ATRIAL FIBRILLATION 180 tablet 0  . losartan (COZAAR) 100 MG tablet TAKE 1 TABLET BY MOUTH EVERY DAY 90 tablet 0  . Multiple Vitamin (MULTIVITAMIN WITH MINERALS) TABS tablet Take 1 tablet by mouth daily.    . promethazine (PHENERGAN) 25 MG tablet Take 25 mg by mouth daily as needed. For nausea and vomiting    . sodium chloride (OCEAN) 0.65 % SOLN nasal spray Place 1 spray into both nostrils daily as needed for congestion.     Marland Kitchen zolpidem (AMBIEN) 5 MG tablet TAKE 1 TABLET BY MOUTH AT BEDTIME AS NEEDED FOR SLEEP 30 tablet 0   No current facility-administered medications on file prior to visit.    Allergies  Allergen Reactions  . Mirtazapine     tremor  . Nickel Other (See Comments)    inflammation  . Other     Prednisone causes sleep disturbances    Past Medical History  Diagnosis Date  . A-fib   . Hypertension   . Arthritis   . Cataracts, bilateral   . Other acariasis     peripherial neuropathy  . History of stomach ulcers   . GI bleed 06/2002  . UTI (lower urinary tract infection)     pt state uti w/ no pain  . PVC (premature ventricular contraction)   . Migraines   . Abdominal pain, unspecified site   . Diverticulosis of colon (without mention of hemorrhage)   . Hemorrhage of gastrointestinal tract, unspecified   . Unspecified hereditary and idiopathic peripheral neuropathy   . Osteoarthrosis, unspecified whether generalized or localized, lower leg   . Palpitations   . Depression   . Other and unspecified alcohol dependence, continuous drinking behavior 10/28/2012  . Nausea alone 10/28/2012  . Obesity, unspecified 10/27/2012  . Diverticulosis of colon (without mention of hemorrhage) 10/27/2012  . Insomnia, unspecified 10/27/2012  . Edema 10/27/2012  .  Localized osteoarthrosis not specified whether primary or secondary, lower leg 05/31/2011  . Abnormality of gait 06/02/2013    Past Surgical History  Procedure Laterality Date  . Breast reduction surgery  12/23/2003  . Cholecystectomy  2005  . Cataracts    . Arthroscopic repair acl    . Tonsillectomy and adenoidectomy  1941  . Breast biopsy  1973    left    History  Smoking status  . Former Smoker  . Types: Cigarettes  . Quit date: 10/09/1983  Smokeless tobacco  . Never Used    History  Alcohol Use  . 0.0 oz/week  . 1-2 Shots of liquor per week    Comment: daily  scotch     History reviewed. No pertinent family history.  Review of  Systems: As noted in history of present illness All other systems were reviewed and are negative.  Physical Exam: BP 126/76 mmHg  Pulse 80  Ht 5' 7.5" (1.715 m)  Wt 194 lb 6.4 oz (88.179 kg)  BMI 29.98 kg/m2 She is a pleasant, elderly white female in no acute distress. The patient is alert and oriented x 3.    The skin is warm and dry.    The HEENT exam is normal. There is no JVD.  The lungs are clear.    The heart exam reveals a regular rate with a normal S1 and S2.  There is a grade 3-6/6 diastolic murmur the right upper sternal border.  The PMI is not displaced.   Abdominal exam reveals good bowel sounds.  There is no guarding or rebound.  There is no hepatosplenomegaly or tenderness.  There are no masses.  Exam of the legs reveal no clubbing, cyanosis, or edema.  The legs are without rashes.  The distal pulses are intact.  Cranial nerves II - XII are intact. She is using a walker.  LABORATORY DATA: ECG was reviewed on 12/02/14. NSR. No acute change  Labs reviewed from Dr. Mariea Clonts in Feb. Normal chem and CBC. Normal TSH.  Assessment / Plan: 1. Atrial fibrillation- maintaining NSR on flecainide. Recommend continue 50 mg bid for maintenance. Continue diltiazem. We will taper off metoprolol.  2. Hypertension, controlled. Continue losartan and diltiazem.  3. UTI  4. Recent fall with right buttocks pain due to hematoma. Will give Rx of Vicodin #20 prn.   Follow up in 6 months.

## 2014-12-12 ENCOUNTER — Encounter: Payer: Self-pay | Admitting: Internal Medicine

## 2014-12-27 ENCOUNTER — Other Ambulatory Visit: Payer: Self-pay | Admitting: Cardiology

## 2015-01-17 ENCOUNTER — Non-Acute Institutional Stay: Payer: Medicare Other | Admitting: Internal Medicine

## 2015-01-17 ENCOUNTER — Encounter: Payer: Self-pay | Admitting: Internal Medicine

## 2015-01-17 VITALS — BP 124/76 | HR 96 | Temp 97.4°F | Wt 189.0 lb

## 2015-01-17 DIAGNOSIS — R059 Cough, unspecified: Secondary | ICD-10-CM

## 2015-01-17 DIAGNOSIS — R61 Generalized hyperhidrosis: Secondary | ICD-10-CM

## 2015-01-17 DIAGNOSIS — R251 Tremor, unspecified: Secondary | ICD-10-CM

## 2015-01-17 DIAGNOSIS — N3281 Overactive bladder: Secondary | ICD-10-CM

## 2015-01-17 DIAGNOSIS — S300XXD Contusion of lower back and pelvis, subsequent encounter: Secondary | ICD-10-CM

## 2015-01-17 DIAGNOSIS — G4700 Insomnia, unspecified: Secondary | ICD-10-CM

## 2015-01-17 DIAGNOSIS — R05 Cough: Secondary | ICD-10-CM | POA: Diagnosis not present

## 2015-01-17 NOTE — Progress Notes (Signed)
Patient ID: [name], female   DOB: Feb 07, 1933, 81 y.o.   MRN: [id number]   Location:  [facility]  Code Status: DNR  Goals of Care: Advanced Directive information Does patient have an advance directive?: Yes, Type of Advance Directive: Out of facility DNR (pink MOST or yellow form);Living will;Healthcare Power of Attorney, Pre-existing out of facility DNR order (yellow form or pink MOST form): Yellow form placed in chart (order not valid for inpatient use), Does patient want to make changes to advanced directive?: No - Patient declined   Allergies  Allergen Reactions  . Mirtazapine     tremor  . Nickel Other (See Comments)    inflammation  . Other     Prednisone causes sleep disturbances    Chief Complaint  Patient presents with  . Medical Management of Chronic Issues    blood pressure, A-Fib, depression, tremor  . Cough    "tickle"  . Sleeping Problem    can't fall asleep, or wakes up at 2:30am, can't go back to sleep until 6:30  . Night Sweats  . pain buttocks    still sore in buttock area from 12/02/14 fall, went to [place] ER    HPI: Patient is a 81 y.o.  seen in the office today for her routine medical mgt visit.  She is a retired Marine scientist.    Says she's her new normal, pretty damn bad.    Has incontinence, recurrent bladder infections, constipation.   Had fall in March and immediately developed hematoma.  Had xrays and coccyx, pelvis ok.  Has hematoma of buttocks and taking hydrocodone one every other day.    Having nightsweats.  They do not wake her.  If awake for several hours and goes back to sleep, she wakes up sweaty. 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   Has had amnesia problems with ambien.    Having tremor again which she had two years ago.    Persistent hacky nonproductive cough with tickle at least in 2016.  Had uti in ED with fall--keflex made it better, but came back.  Losartan instead of benicar for 8-10 years.  Does not have regular  acid reflux that she is aware of.  Wonders if she has some allergies also.  Sometimes does precipitate a sneezing fit and runny nose.    Review of Systems:  Review of Systems  Constitutional: Negative for fever and chills.  HENT: Positive for congestion.   Eyes: Negative for blurred vision.  Respiratory: Negative for shortness of breath.   Cardiovascular: Negative for chest pain.  Gastrointestinal: Negative for abdominal pain.  Genitourinary: Positive for urgency and frequency. Negative for dysuria.  Musculoskeletal: Positive for falls.       Buttock pain  Neurological: Negative for dizziness.  Psychiatric/Behavioral: Negative for memory loss.     Past Medical History  Diagnosis Date  . A-fib   . Hypertension   . Arthritis   . Cataracts, bilateral   . Other acariasis     peripherial neuropathy  . History of stomach ulcers   . GI bleed 06/2002  . UTI (lower urinary tract infection)     pt state uti w/ no pain  . PVC (premature ventricular contraction)   . Migraines   . Abdominal pain, unspecified site   . Diverticulosis of colon (without mention of hemorrhage)   . Hemorrhage of gastrointestinal tract, unspecified   . Unspecified hereditary and idiopathic peripheral neuropathy   . Osteoarthrosis, unspecified whether generalized  or localized, lower leg   . Palpitations   . Depression   . Other and unspecified alcohol dependence, continuous drinking behavior 10/28/2012  . Nausea alone 10/28/2012  . Obesity, unspecified 10/27/2012  . Diverticulosis of colon (without mention of hemorrhage) 10/27/2012  . Insomnia, unspecified 10/27/2012  . Edema 10/27/2012  . Localized osteoarthrosis not specified whether primary or secondary, lower leg 05/31/2011  . Abnormality of gait 06/02/2013    Past Surgical History  Procedure Laterality Date  . Breast reduction surgery  12/23/2003  . Cholecystectomy  2005  . Cataracts    . Arthroscopic repair acl    . Tonsillectomy and adenoidectomy  1941  .  Breast biopsy  1973    left    Social History:   reports that she quit smoking about 31 years ago. Her smoking use included Cigarettes. She has never used smokeless tobacco. She reports that she drinks alcohol. She reports that she does not use illicit drugs.  History reviewed. No pertinent family history.  Medications: Patient's Medications  New Prescriptions   No medications on file  Previous Medications   ACETAMINOPHEN (TYLENOL) 325 MG TABLET    Take 650 mg by mouth 2 (two) times daily as needed. For pain   DILTIAZEM (CARTIA XT) 180 MG 24 HR CAPSULE    Take 1 capsule (180 mg total) by mouth 2 (two) times daily.   ELIQUIS 5 MG TABS TABLET    TAKE 1 TABLET BY MOUTH TWICE DAILY   FEEDING SUPPLEMENT (ENSURE COMPLETE) LIQD    Take 237 mLs by mouth 2 (two) times daily between meals.   FLECAINIDE (TAMBOCOR) 50 MG TABLET    TAKE 1 TABLET BY MOUTH TWICE DAILY TO CONTROL ATRIAL FIBRILLATION   HYDROCODONE-ACETAMINOPHEN (NORCO/VICODIN) 5-325 MG PER TABLET    Take 1 tablet by mouth every 6 (six) hours as needed.   LOSARTAN (COZAAR) 100 MG TABLET    TAKE 1 TABLET BY MOUTH EVERY DAY   MULTIPLE VITAMIN (MULTIVITAMIN WITH MINERALS) TABS TABLET    Take 1 tablet by mouth daily.   PROMETHAZINE (PHENERGAN) 25 MG TABLET    Take 25 mg by mouth daily as needed. For nausea and vomiting   SODIUM CHLORIDE (OCEAN) 0.65 % SOLN NASAL SPRAY    Place 1 spray into both nostrils daily as needed for congestion.   ZOLPIDEM (AMBIEN) 5 MG TABLET    TAKE 1 TABLET BY MOUTH AT BEDTIME AS NEEDED FOR SLEEP  Modified Medications   No medications on file  Discontinued Medications   CEPHALEXIN (KEFLEX) 500 MG CAPSULE    Take 1 capsule (500 mg total) by mouth 2 (two) times daily.     Physical Exam: Filed Vitals:   01/17/15 1647  BP: 124/76  Pulse: 96  Temp: 97.4 F (36.3 C)  TempSrc: Oral  Weight: 189 lb (85.73 kg)  SpO2: 97%  Physical Exam  Constitutional: She is oriented to person, place, and time. She appears  well-developed and well-nourished. No distress.  Cardiovascular: Normal rate, regular rhythm, normal heart sounds and intact distal pulses.   Pulmonary/Chest: Effort normal and breath sounds normal. No respiratory distress.  Abdominal: Soft. Bowel sounds are normal. She exhibits no distension and no mass. There is no tenderness.  Musculoskeletal: She exhibits tenderness.  Of right buttock deep, but no palpable hematoma  Neurological: She is alert and oriented to person, place, and time.  Skin: Skin is warm and dry.  Psychiatric: She has a normal mood and affect.    Labs  reviewed: Basic Metabolic Panel:  Recent Labs  05/10/14  NA 138  K 4.1  BUN 11  CREATININE 0.7   Liver Function Tests:  Recent Labs  05/10/14  AST 17  ALT 14  ALKPHOS 88  Lipid Panel:  Recent Labs  05/10/14  CHOL 203*  HDL 86*  LDLCALC 96  TRIG 158   Lab Results  Component Value Date   HGBA1C 5.2 06/08/2013   Assessment/Plan 1. Overactive bladder -pt was given handout with kegel exercises -she also would like to try myrbetriq 25mg  daily, but has not tried other bladder medications before that will make her dizzy, fall, be confused or give her dry mouth nor does she desire to (I don't recommend she does either) -we tried to get her samples, but there were none of the 25mg  in the office so Rx was handwritten  2. Cough -pt notes allergy symptoms and seems to think this could be cause -try plain zyrtec otc for this and reassess in one month--if this is not better, we will plan to taper her losartan and try another agent (her bps will go up if this is abruptly stopped)  3. Insomnia -discussed that most of the meds for sleep have bad side effects -she has experienced some amnesia with ambien 10mg  and her dosage was reduced to 5mg  when she was acutely ill -advised to d/c ambien and try belsomra 10mg  which has actually been studied in older adults -also was told that she should not just give up if this  dose is ineffective and we can go up if needed -possible cost also mentioned  4. Traumatic hematoma of buttock, subsequent encounter -still has pain in right buttock deep in sacral area, but no visible evidence or palpable mass -slowly improving  5. Tremor -previously noted -can possibly be from her flecainide, but time course was not explored today and she did not mention this concern to me, only the medical assistant -she's apparently had it before, but it has returned  6. Unexplained night sweats -should not be related to her medications and is new so not due to menopause -? Too hot in her home  Labs/tests ordered:  No new Next appt:  1 month to reassess cough, incontinence  Lamiya Naas L. Taleigh Gero, D.O. [facility] [address], [place] [address] Cell Phone (Mon-Fri 8am-5pm):  [phone] On Call:  [phone] & follow prompts after 5pm & weekends Office Phone:  [phone] Office Fax:  [phone]

## 2015-01-17 NOTE — Patient Instructions (Signed)
Kegel Exercises The goal of Kegel exercises is to isolate and exercise your pelvic floor muscles. These muscles act as a hammock that supports the rectum, vagina, small intestine, and uterus. As the muscles weaken, the hammock sags and these organs are displaced from their normal positions. Kegel exercises can strengthen your pelvic floor muscles and help you to improve bladder and bowel control, improve sexual response, and help reduce many problems and some discomfort during pregnancy. Kegel exercises can be done anywhere and at any time. HOW TO PERFORM KEGEL EXERCISES 1. Locate your pelvic floor muscles. To do this, squeeze (contract) the muscles that you use when you try to stop the flow of urine. You will feel a tightness in the vaginal area (women) and a tight lift in the rectal area (men and women). 2. When you begin, contract your pelvic muscles tight for 2-5 seconds, then relax them for 2-5 seconds. This is one set. Do 4-5 sets with a short pause in between. 3. Contract your pelvic muscles for 8-10 seconds, then relax them for 8-10 seconds. Do 4-5 sets. If you cannot contract your pelvic muscles for 8-10 seconds, try 5-7 seconds and work your way up to 8-10 seconds. Your goal is 4-5 sets of 10 contractions each day. Keep your stomach, buttocks, and legs relaxed during the exercises. Perform sets of both short and long contractions. Vary your positions. Perform these contractions 3-4 times per day. Perform sets while you are:   Lying in bed in the morning.  Standing at lunch.  Sitting in the late afternoon.  Lying in bed at night. You should do 40-50 contractions per day. Do not perform more Kegel exercises per day than recommended. Overexercising can cause muscle fatigue. Continue these exercises for for at least 15-20 weeks or as directed by your caregiver. Document Released: 08/26/2012 Document Reviewed: 08/26/2012 Exit[employer] Patient Information 2015 [employer]. This information is  not intended to replace advice given to you by your health care provider. Make sure you discuss any questions you have with your health care provider.  

## 2015-01-18 ENCOUNTER — Other Ambulatory Visit: Payer: Self-pay | Admitting: Internal Medicine

## 2015-01-18 ENCOUNTER — Telehealth: Payer: Self-pay

## 2015-01-18 MED ORDER — MIRABEGRON ER 25 MG PO TB24: 25.0000 mg | ORAL_TABLET | Freq: Every day | ORAL | Status: DC

## 2015-01-18 MED ORDER — SUVOREXANT 10 MG PO TABS: 10.0000 mg | ORAL_TABLET | Freq: Every evening | ORAL | Status: DC | PRN

## 2015-01-18 NOTE — Telephone Encounter (Signed)
I tried to call the pharmacy x 2, left on hold so long phone call ended, I will try to reach pharmacy again later.

## 2015-01-18 NOTE — Telephone Encounter (Signed)
I already reviewed the interaction when I signed the order.  She would like to try the medication.  Please ask that they fill it.

## 2015-01-18 NOTE — Telephone Encounter (Signed)
Message left on triage voicemail, myrbetriq interacts with flecainide. Please reconsider prescribing these 2 medications together or confirm that you would like to over-ride contraindication.

## 2015-01-19 NOTE — Telephone Encounter (Signed)
Left message on voicemail for pharmacy to fill medication per Dr.Reed, unable to get through to speak to someone

## 2015-01-22 ENCOUNTER — Other Ambulatory Visit: Payer: Self-pay | Admitting: Internal Medicine

## 2015-01-23 DIAGNOSIS — M170 Bilateral primary osteoarthritis of knee: Secondary | ICD-10-CM | POA: Diagnosis not present

## 2015-01-31 ENCOUNTER — Ambulatory Visit: Payer: Medicare Other | Admitting: Cardiology

## 2015-02-07 ENCOUNTER — Other Ambulatory Visit: Payer: Self-pay | Admitting: Internal Medicine

## 2015-02-21 ENCOUNTER — Non-Acute Institutional Stay: Payer: Medicare Other | Admitting: Internal Medicine

## 2015-02-21 ENCOUNTER — Encounter: Payer: Self-pay | Admitting: Internal Medicine

## 2015-02-21 VITALS — BP 122/64 | HR 96 | Wt 184.0 lb

## 2015-02-21 DIAGNOSIS — R05 Cough: Secondary | ICD-10-CM

## 2015-02-21 DIAGNOSIS — N3281 Overactive bladder: Secondary | ICD-10-CM

## 2015-02-21 DIAGNOSIS — G4700 Insomnia, unspecified: Secondary | ICD-10-CM | POA: Diagnosis not present

## 2015-02-21 DIAGNOSIS — I482 Chronic atrial fibrillation, unspecified: Secondary | ICD-10-CM

## 2015-02-21 DIAGNOSIS — R059 Cough, unspecified: Secondary | ICD-10-CM

## 2015-02-21 MED ORDER — ZOLPIDEM TARTRATE 10 MG PO TABS: 10.0000 mg | ORAL_TABLET | Freq: Every evening | ORAL | Status: DC | PRN

## 2015-02-21 MED ORDER — CETIRIZINE HCL 10 MG PO TABS: 10.0000 mg | ORAL_TABLET | Freq: Every day | ORAL | Status: DC

## 2015-02-21 NOTE — Progress Notes (Signed)
Patient ID: [name], female   DOB: May 19, 1933, 81 y.o.   MRN: [id number]   Location:  [facility]  Code Status: DNR  Goals of Care:Advanced Directive information Does patient have an advance directive?: Yes, Type of Advance Directive: Living will;Healthcare Power of Attorney, Pre-existing out of facility DNR order (yellow form or pink MOST form): Yellow form placed in chart (order not valid for inpatient use), Does patient want to make changes to advanced directive?: No - Patient declined  Chief Complaint  Patient presents with  . Medical Management of Chronic Issues    overactive bladder. Didn't take Myrbetriq interacts with Flecainide    HPI: Patient is a 81 y.o. seen in the [facility] today for med mgt of chronic diseases.    Did try to do some kegels--not on a faithful routine, but tried.  Cough improved 80% with zyrtec.    Belsomra did not help.  Fri night took ambien 10mg .  Sat and Sun night, took 15mg  belsomra.  Did not help her sleep--was awake until daylight.  Last night took Fri 10mg  for sleeping.  She wants to take the 10mg  again.  Says she is a big lady and not some frail little thing and will tolerate it.  Says she feels a lot better after she sleeps well.  Has a rosier outlook on life.    Review of Systems:  Review of Systems  Constitutional: Negative for fever and chills.  HENT: Negative for congestion.   Eyes:       Glasses  Respiratory: Negative for shortness of breath.   Cardiovascular: Negative for chest pain and leg swelling.  Gastrointestinal: Negative for abdominal pain, constipation, blood in stool and melena.  Genitourinary: Positive for urgency and frequency. Negative for dysuria.       Urinary incontinence  Musculoskeletal: Negative for falls.       Using small rolling walker  Neurological: Negative for dizziness.  Psychiatric/Behavioral: Negative for depression and memory loss.    Past Medical History  Diagnosis Date  . A-fib    . Hypertension   . Arthritis   . Cataracts, bilateral   . Other acariasis     peripherial neuropathy  . History of stomach ulcers   . GI bleed 06/2002  . UTI (lower urinary tract infection)     pt state uti w/ no pain  . PVC (premature ventricular contraction)   . Migraines   . Abdominal pain, unspecified site   . Diverticulosis of colon (without mention of hemorrhage)   . Hemorrhage of gastrointestinal tract, unspecified   . Unspecified hereditary and idiopathic peripheral neuropathy   . Osteoarthrosis, unspecified whether generalized or localized, lower leg   . Palpitations   . Depression   . Other and unspecified alcohol dependence, continuous drinking behavior 10/28/2012  . Nausea alone 10/28/2012  . Obesity, unspecified 10/27/2012  . Diverticulosis of colon (without mention of hemorrhage) 10/27/2012  . Insomnia, unspecified 10/27/2012  . Edema 10/27/2012  . Localized osteoarthrosis not specified whether primary or secondary, lower leg 05/31/2011  . Abnormality of gait 06/02/2013    Past Surgical History  Procedure Laterality Date  . Breast reduction surgery  12/23/2003  . Cholecystectomy  2005  . Cataracts    . Arthroscopic repair acl    . Tonsillectomy and adenoidectomy  1941  . Breast biopsy  1973    left    Social History:   reports that she quit smoking about 31 years ago. Her smoking use  included Cigarettes. She has never used smokeless tobacco. She reports that she drinks alcohol. She reports that she does not use illicit drugs.  Allergies  Allergen Reactions  . Mirtazapine     tremor  . Nickel Other (See Comments)    inflammation  . Other     Prednisone causes sleep disturbances    Medications: Patient's Medications  New Prescriptions   CETIRIZINE (ZYRTEC) 10 MG TABLET    Take 1 tablet (10 mg total) by mouth daily.   ZOLPIDEM (AMBIEN) 10 MG TABLET    Take 1 tablet (10 mg total) by mouth at bedtime as needed for sleep.  Previous Medications   ACETAMINOPHEN  (TYLENOL) 325 MG TABLET    Take 650 mg by mouth 2 (two) times daily as needed. For pain   DILTIAZEM (CARTIA XT) 180 MG 24 HR CAPSULE    Take 1 capsule (180 mg total) by mouth 2 (two) times daily.   ELIQUIS 5 MG TABS TABLET    TAKE 1 TABLET BY MOUTH TWICE DAILY   FLECAINIDE (TAMBOCOR) 50 MG TABLET    TAKE 1 TABLET BY MOUTH TWICE DAILY TO CONTROL ATRIAL FIBRILLATION   LOSARTAN (COZAAR) 100 MG TABLET    TAKE 1 TABLET BY MOUTH DAILY   MIRABEGRON ER (MYRBETRIQ) 25 MG TB24 TABLET    Take 1 tablet (25 mg total) by mouth daily.   MULTIPLE VITAMIN (MULTIVITAMIN WITH MINERALS) TABS TABLET    Take 1 tablet by mouth daily.   PROMETHAZINE (PHENERGAN) 25 MG TABLET    Take 25 mg by mouth daily as needed. For nausea and vomiting   SODIUM CHLORIDE (OCEAN) 0.65 % SOLN NASAL SPRAY    Place 1 spray into both nostrils daily as needed for congestion.  Modified Medications   No medications on file  Discontinued Medications   FEEDING SUPPLEMENT (ENSURE COMPLETE) LIQD    Take 237 mLs by mouth 2 (two) times daily between meals.   HYDROCODONE-ACETAMINOPHEN (NORCO/VICODIN) 5-325 MG PER TABLET    Take 1 tablet by mouth every 6 (six) hours as needed.   SUVOREXANT (BELSOMRA) 10 MG TABS    Take 10 mg by mouth at bedtime as needed (insomnia).     Physical Exam: Filed Vitals:   02/21/15 1639  BP: 122/64  Pulse: 96  Weight: 184 lb (83.462 kg)  SpO2: 99%   Body mass index is 28.38 kg/(m^2). Physical Exam  Constitutional: She is oriented to person, place, and time. She appears well-developed and well-nourished. No distress.  Cardiovascular: Intact distal pulses.   irreg irreg  Pulmonary/Chest: Effort normal and breath sounds normal. No respiratory distress.  Abdominal: Soft. Bowel sounds are normal. She exhibits no distension. There is no tenderness.  Musculoskeletal: Normal range of motion.  Neurological: She is alert and oriented to person, place, and time.  Skin: Skin is warm and dry.  Psychiatric: She has a  normal mood and affect.   Labs reviewed: Basic Metabolic Panel:  Recent Labs  05/10/14  NA 138  K 4.1  BUN 11  CREATININE 0.7   Liver Function Tests:  Recent Labs  05/10/14  AST 17  ALT 14  ALKPHOS 88  Lipid Panel:  Recent Labs  05/10/14  CHOL 203*  HDL 86*  LDLCALC 96  TRIG 158   Lab Results  Component Value Date   HGBA1C 5.2 06/08/2013   Procedures since last appt:  Patient Care Team: Gayland Curry, DO as PCP - General (Geriatric Medicine) Peter M Martinique, MD as  Attending Physician (Cardiology) [place], MD as Consulting Physician (Neurology)  Assessment/Plan 1. Overactive bladder -wants to try myrbetriq even though it may increase her risk of arrhythmias due to interaction with flecainide--she was informed of this risk, but really wants to urinate less frequently and not be incontinent -cont kegels also  2. Cough -resolved with use of zyrtec daily so advised to continue  3. Insomnia -wants to go back on ambien 10mg --discouraged this and we tried her on belsomra, but she has not had any success even with the higher dosages of belsomra and sleeps very well with ambien 10mg  -she is aware that it can affect her balance and cause her to fall, but she still wants it b/c she feels so much better after she sleeps  4. Chronic atrial fibrillation -cont eliquis and cardizem and flecainide  Labs/tests ordered:  Cbc, cmp, flp, tsh Next appt:  6 mos with labs before  Elyon Zoll L. Tywan Siever, D.O. [facility] [address], [place] [address] Cell Phone (Mon-Fri 8am-5pm):  [phone] On Call:  [phone] & follow prompts after 5pm & weekends Office Phone:  [phone] Office Fax:  [phone]

## 2015-04-06 DIAGNOSIS — R278 Other lack of coordination: Secondary | ICD-10-CM | POA: Diagnosis not present

## 2015-04-06 DIAGNOSIS — R2681 Unsteadiness on feet: Secondary | ICD-10-CM | POA: Diagnosis not present

## 2015-04-06 DIAGNOSIS — M6281 Muscle weakness (generalized): Secondary | ICD-10-CM | POA: Diagnosis not present

## 2015-04-06 DIAGNOSIS — Z9181 History of falling: Secondary | ICD-10-CM | POA: Diagnosis not present

## 2015-04-10 DIAGNOSIS — M6281 Muscle weakness (generalized): Secondary | ICD-10-CM | POA: Diagnosis not present

## 2015-04-10 DIAGNOSIS — R2681 Unsteadiness on feet: Secondary | ICD-10-CM | POA: Diagnosis not present

## 2015-04-10 DIAGNOSIS — R278 Other lack of coordination: Secondary | ICD-10-CM | POA: Diagnosis not present

## 2015-04-10 DIAGNOSIS — Z9181 History of falling: Secondary | ICD-10-CM | POA: Diagnosis not present

## 2015-04-13 DIAGNOSIS — R278 Other lack of coordination: Secondary | ICD-10-CM | POA: Diagnosis not present

## 2015-04-13 DIAGNOSIS — Z9181 History of falling: Secondary | ICD-10-CM | POA: Diagnosis not present

## 2015-04-13 DIAGNOSIS — R2681 Unsteadiness on feet: Secondary | ICD-10-CM | POA: Diagnosis not present

## 2015-04-13 DIAGNOSIS — M6281 Muscle weakness (generalized): Secondary | ICD-10-CM | POA: Diagnosis not present

## 2015-04-17 DIAGNOSIS — Z9181 History of falling: Secondary | ICD-10-CM | POA: Diagnosis not present

## 2015-04-17 DIAGNOSIS — R278 Other lack of coordination: Secondary | ICD-10-CM | POA: Diagnosis not present

## 2015-04-17 DIAGNOSIS — R2681 Unsteadiness on feet: Secondary | ICD-10-CM | POA: Diagnosis not present

## 2015-04-17 DIAGNOSIS — M6281 Muscle weakness (generalized): Secondary | ICD-10-CM | POA: Diagnosis not present

## 2015-04-20 DIAGNOSIS — R278 Other lack of coordination: Secondary | ICD-10-CM | POA: Diagnosis not present

## 2015-04-20 DIAGNOSIS — R2681 Unsteadiness on feet: Secondary | ICD-10-CM | POA: Diagnosis not present

## 2015-04-20 DIAGNOSIS — M6281 Muscle weakness (generalized): Secondary | ICD-10-CM | POA: Diagnosis not present

## 2015-04-20 DIAGNOSIS — Z9181 History of falling: Secondary | ICD-10-CM | POA: Diagnosis not present

## 2015-04-21 DIAGNOSIS — R309 Painful micturition, unspecified: Secondary | ICD-10-CM | POA: Diagnosis not present

## 2015-04-21 DIAGNOSIS — R3915 Urgency of urination: Secondary | ICD-10-CM | POA: Diagnosis not present

## 2015-04-24 DIAGNOSIS — R2681 Unsteadiness on feet: Secondary | ICD-10-CM | POA: Diagnosis not present

## 2015-04-24 DIAGNOSIS — Z9181 History of falling: Secondary | ICD-10-CM | POA: Diagnosis not present

## 2015-04-24 DIAGNOSIS — R278 Other lack of coordination: Secondary | ICD-10-CM | POA: Diagnosis not present

## 2015-04-24 DIAGNOSIS — M6281 Muscle weakness (generalized): Secondary | ICD-10-CM | POA: Diagnosis not present

## 2015-04-27 DIAGNOSIS — R2681 Unsteadiness on feet: Secondary | ICD-10-CM | POA: Diagnosis not present

## 2015-04-27 DIAGNOSIS — Z9181 History of falling: Secondary | ICD-10-CM | POA: Diagnosis not present

## 2015-04-27 DIAGNOSIS — M6281 Muscle weakness (generalized): Secondary | ICD-10-CM | POA: Diagnosis not present

## 2015-04-27 DIAGNOSIS — R278 Other lack of coordination: Secondary | ICD-10-CM | POA: Diagnosis not present

## 2015-04-28 ENCOUNTER — Encounter: Payer: Self-pay | Admitting: Internal Medicine

## 2015-05-02 DIAGNOSIS — R278 Other lack of coordination: Secondary | ICD-10-CM | POA: Diagnosis not present

## 2015-05-02 DIAGNOSIS — M6281 Muscle weakness (generalized): Secondary | ICD-10-CM | POA: Diagnosis not present

## 2015-05-02 DIAGNOSIS — Z9181 History of falling: Secondary | ICD-10-CM | POA: Diagnosis not present

## 2015-05-02 DIAGNOSIS — R2681 Unsteadiness on feet: Secondary | ICD-10-CM | POA: Diagnosis not present

## 2015-05-11 ENCOUNTER — Other Ambulatory Visit: Payer: Self-pay | Admitting: Internal Medicine

## 2015-05-15 ENCOUNTER — Other Ambulatory Visit: Payer: Self-pay | Admitting: Internal Medicine

## 2015-05-22 ENCOUNTER — Encounter: Payer: Self-pay | Admitting: Internal Medicine

## 2015-06-13 ENCOUNTER — Other Ambulatory Visit: Payer: Self-pay | Admitting: Internal Medicine

## 2015-06-23 ENCOUNTER — Encounter: Payer: Self-pay | Admitting: Cardiology

## 2015-06-23 ENCOUNTER — Ambulatory Visit (INDEPENDENT_AMBULATORY_CARE_PROVIDER_SITE_OTHER): Payer: Medicare Other | Admitting: Cardiology

## 2015-06-23 VITALS — BP 138/82 | HR 98 | Ht 67.5 in | Wt 188.2 lb

## 2015-06-23 DIAGNOSIS — I493 Ventricular premature depolarization: Secondary | ICD-10-CM | POA: Diagnosis not present

## 2015-06-23 DIAGNOSIS — Z23 Encounter for immunization: Secondary | ICD-10-CM | POA: Diagnosis not present

## 2015-06-23 DIAGNOSIS — I10 Essential (primary) hypertension: Secondary | ICD-10-CM

## 2015-06-23 DIAGNOSIS — I480 Paroxysmal atrial fibrillation: Secondary | ICD-10-CM

## 2015-06-23 MED ORDER — METOPROLOL SUCCINATE ER 50 MG PO TB24: 50.0000 mg | ORAL_TABLET | Freq: Every day | ORAL | Status: DC

## 2015-06-23 NOTE — Progress Notes (Signed)
[name] Date of Birth: 13-Mar-1933 Medical Record #[id number]  History of Present Illness: [name] is seen for follow up of atrial fibrillation. She initially was diagnosed with Afib in 2013 and converted spontaneously. She had recurrence in Jan 2014 and converted on Flecainide. She has been managed with flecainide and diltiazem. She is also anticoagulated with Eliquis. On her last visit we stopped her metoprolol. She notes HR stays faster now and she has more PVCs. Would like to stop Eliquis so she could take NSAIDs for arthritis.   Current Outpatient Prescriptions on File Prior to Visit  Medication Sig Dispense Refill  . acetaminophen (TYLENOL) 325 MG tablet Take 650 mg by mouth 2 (two) times daily as needed. For pain    . ELIQUIS 5 MG TABS tablet TAKE 1 TABLET BY MOUTH TWICE DAILY 180 tablet 5  . flecainide (TAMBOCOR) 50 MG tablet TAKE 1 TABLET BY MOUTH TWICE DAILY FOR ATRIAL FIBRILLATION 180 tablet 1  . losartan (COZAAR) 100 MG tablet TAKE 1 TABLET BY MOUTH DAILY 90 tablet 1  . Multiple Vitamin (MULTIVITAMIN WITH MINERALS) TABS tablet Take 1 tablet by mouth daily.    . promethazine (PHENERGAN) 25 MG tablet Take 25 mg by mouth daily as needed. For nausea and vomiting    . sodium chloride (OCEAN) 0.65 % SOLN nasal spray Place 1 spray into both nostrils daily as needed for congestion.    Marland Kitchen zolpidem (AMBIEN) 10 MG tablet TAKE 1 TABLET BY MOUTH AT BEDTIME AS NEEDED FOR INSOMNIA 30 tablet 0   No current facility-administered medications on file prior to visit.    Allergies  Allergen Reactions  . Mirtazapine     tremor  . Nickel Other (See Comments)    inflammation  . Other     Prednisone causes sleep disturbances    Past Medical History  Diagnosis Date  . A-fib   . Hypertension   . Arthritis   . Cataracts, bilateral   . Other acariasis     peripherial neuropathy  . History of stomach ulcers   . GI bleed 06/2002  . UTI (lower urinary tract infection)     pt state uti  w/ no pain  . PVC (premature ventricular contraction)   . Migraines   . Abdominal pain, unspecified site   . Diverticulosis of colon (without mention of hemorrhage)   . Hemorrhage of gastrointestinal tract, unspecified   . Unspecified hereditary and idiopathic peripheral neuropathy   . Osteoarthrosis, unspecified whether generalized or localized, lower leg   . Palpitations   . Depression   . Other and unspecified alcohol dependence, continuous drinking behavior 10/28/2012  . Nausea alone 10/28/2012  . Obesity, unspecified 10/27/2012  . Diverticulosis of colon (without mention of hemorrhage) 10/27/2012  . Insomnia, unspecified 10/27/2012  . Edema 10/27/2012  . Localized osteoarthrosis not specified whether primary or secondary, lower leg 05/31/2011  . Abnormality of gait 06/02/2013    Past Surgical History  Procedure Laterality Date  . Breast reduction surgery  12/23/2003  . Cholecystectomy  2005  . Cataracts    . Arthroscopic repair acl    . Tonsillectomy and adenoidectomy  1941  . Breast biopsy  1973    left    History  Smoking status  . Former Smoker  . Types: Cigarettes  . Quit date: 10/09/1983  Smokeless tobacco  . Never Used    History  Alcohol Use  . 0.0 oz/week  . 1-2 Shots of liquor per week  Comment: daily  scotch     History reviewed. No pertinent family history.  Review of Systems: As noted in history of present illness All other systems were reviewed and are negative.  Physical Exam: BP 138/82 mmHg  Pulse 98  Ht 5' 7.5" (1.715 m)  Wt 85.367 kg (188 lb 3.2 oz)  BMI 29.02 kg/m2 She is a pleasant, elderly white female in no acute distress. The patient is alert and oriented x 3.    The skin is warm and dry.    The HEENT exam is normal. There is no JVD.  The lungs are clear.    The heart exam reveals a regular rate with a normal S1 and S2.  There is a grade 8-6/1 diastolic murmur the right upper sternal border.  The PMI is not displaced.   Abdominal exam reveals  good bowel sounds.  There is no guarding or rebound.  There is no hepatosplenomegaly or tenderness.  There are no masses.  Exam of the legs reveal no clubbing, cyanosis, or edema.  The legs are without rashes.  The distal pulses are intact.  Cranial nerves II - XII are intact. She is using a walker.  LABORATORY DATA:   Assessment / Plan: 1. Atrial fibrillation- maintaining NSR on flecainide. Recommend continue 50 mg bid for maintenance. She states she felt better on metoprolol so we will resume Toprol XL 50 mg daily and stop diltiazem. Explained rationale for continued Eliquis use to reduce risk of CVA. She is on appropriate dose. 81 yo but normal renal function and body weight > 60 kg.  2. Hypertension, controlled. Continue losartan and Toprol  3. PVCs- should do better on metoprolol.  Follow up in 6 months.

## 2015-06-23 NOTE — Addendum Note (Signed)
Addended by: Golden Hurter D on: 06/23/2015 02:37 PM   Modules accepted: Orders

## 2015-06-23 NOTE — Patient Instructions (Signed)
Resume Toprol XL 50 mg daily  Stop taking diltiazem  Continue your other therapy  I will see you in 6-8 months.

## 2015-07-12 ENCOUNTER — Other Ambulatory Visit: Payer: Self-pay | Admitting: Internal Medicine

## 2015-07-21 DIAGNOSIS — M25461 Effusion, right knee: Secondary | ICD-10-CM | POA: Diagnosis not present

## 2015-07-21 DIAGNOSIS — M170 Bilateral primary osteoarthritis of knee: Secondary | ICD-10-CM | POA: Diagnosis not present

## 2015-07-21 DIAGNOSIS — M25462 Effusion, left knee: Secondary | ICD-10-CM | POA: Diagnosis not present

## 2015-08-10 ENCOUNTER — Other Ambulatory Visit: Payer: Self-pay | Admitting: Internal Medicine

## 2015-09-05 NOTE — Progress Notes (Signed)
This encounter was created in error - please disregard.

## 2015-09-11 ENCOUNTER — Other Ambulatory Visit: Payer: Self-pay | Admitting: Internal Medicine

## 2015-10-03 DIAGNOSIS — I4891 Unspecified atrial fibrillation: Secondary | ICD-10-CM | POA: Diagnosis not present

## 2015-10-03 DIAGNOSIS — R739 Hyperglycemia, unspecified: Secondary | ICD-10-CM | POA: Diagnosis not present

## 2015-10-03 DIAGNOSIS — I10 Essential (primary) hypertension: Secondary | ICD-10-CM | POA: Diagnosis not present

## 2015-10-03 LAB — LIPID PANEL
Cholesterol: 171 mg/dL (ref 0–200)
HDL: 63 mg/dL (ref 35–70)
LDL CALC: 81 mg/dL
Triglycerides: 136 mg/dL (ref 40–160)

## 2015-10-03 LAB — BASIC METABOLIC PANEL
BUN: 14 mg/dL (ref 4–21)
Creatinine: 0.9 mg/dL (ref 0.5–1.1)
Glucose: 98 mg/dL
Potassium: 4.7 mmol/L (ref 3.4–5.3)
SODIUM: 141 mmol/L (ref 137–147)

## 2015-10-03 LAB — HEPATIC FUNCTION PANEL
ALK PHOS: 64 U/L (ref 25–125)
ALT: 10 U/L (ref 7–35)
AST: 16 U/L (ref 13–35)
Bilirubin, Total: 0.7 mg/dL

## 2015-10-03 LAB — CBC AND DIFFERENTIAL
HEMATOCRIT: 42 % (ref 36–46)
HEMOGLOBIN: 14.9 g/dL (ref 12.0–16.0)
PLATELETS: 231 10*3/uL (ref 150–399)
WBC: 7.5 10^3/mL

## 2015-10-03 LAB — HEMOGLOBIN A1C: Hemoglobin A1C: 5.3

## 2015-10-04 ENCOUNTER — Encounter: Payer: Self-pay | Admitting: Internal Medicine

## 2015-10-04 ENCOUNTER — Non-Acute Institutional Stay: Payer: Medicare Other | Admitting: Internal Medicine

## 2015-10-04 VITALS — BP 128/72 | HR 72 | Temp 97.2°F | Ht 67.0 in | Wt 192.0 lb

## 2015-10-04 DIAGNOSIS — M170 Bilateral primary osteoarthritis of knee: Secondary | ICD-10-CM | POA: Diagnosis not present

## 2015-10-04 DIAGNOSIS — G4700 Insomnia, unspecified: Secondary | ICD-10-CM

## 2015-10-04 DIAGNOSIS — I482 Chronic atrial fibrillation, unspecified: Secondary | ICD-10-CM

## 2015-10-04 DIAGNOSIS — N3281 Overactive bladder: Secondary | ICD-10-CM

## 2015-10-04 DIAGNOSIS — R110 Nausea: Secondary | ICD-10-CM

## 2015-10-04 DIAGNOSIS — Z0000 Encounter for general adult medical examination without abnormal findings: Secondary | ICD-10-CM

## 2015-10-04 DIAGNOSIS — Z23 Encounter for immunization: Secondary | ICD-10-CM | POA: Diagnosis not present

## 2015-10-04 MED ORDER — ZOLPIDEM TARTRATE 10 MG PO TABS: ORAL_TABLET | ORAL | Status: DC

## 2015-10-04 MED ORDER — PROMETHAZINE HCL 25 MG PO TABS: 25.0000 mg | ORAL_TABLET | Freq: Every day | ORAL | Status: DC | PRN

## 2015-10-04 NOTE — Progress Notes (Signed)
Patient ID: [name], female   DOB: [birth year], 82 y.o.   MRN: [id number] MMSE 30/30 passed clock drawing

## 2015-10-04 NOTE — Progress Notes (Signed)
Patient ID: [name], female   DOB: 1933/08/01, 82 y.o.   MRN: [id number]   Location: [facility] Provider: Lastacia Solum L. Mariea Clonts, D.O., C.M.D.  Code Status:DNR Goals of Care: Advanced Directive information Does patient have an advance directive?: Yes, Type of Advance Directive: [place];Living will;Out of facility DNR (pink MOST or yellow form), Pre-existing out of facility DNR order (yellow form or pink MOST form): Yellow form placed in chart (order not valid for inpatient use)  Chief Complaint  Patient presents with  . Annual Exam    Wellness exam  . Medical Management of Chronic Issues    blood pressure, A-Fib, depression, insomnia  . MMSE    30/30 passed clock drawing    HPI: Patient is a 82 y.o. female seen in the office today for an annual wellness exam.  Reports she in general doesn't feel well.  Multitasking is reading and watching tv at the same time.  Lacks motivation.  Never feels more than 80%.    Has transient nausea she uses promethazine for.  No correlation.  Went to Dr. Cristina Gong about this many years ago.  He didn't fine any clear cause either.  Just refused cscope.  Had one polyp at one point when she lived in [place]--turned out benign.  Depression screen PHQ 2/9 10/04/2015  Decreased Interest 0  Down, Depressed, Hopeless 0  PHQ - 2 Score 0    Fall Risk  10/04/2015 03/29/2013  Falls in the past year? Yes No  Number falls in past yr: 2 or more -  Injury with Fall? No -  Risk for fall due to : History of fall(s);Impaired mobility -  says due to her late stage OA of her knees.  One will give, cant catch herself, but has not broken anything.  Bruised ego only. Uses her rolling walker all of the time except kitchen where she furniture surfs.    MMSE - Mini Mental State Exam 10/04/2015  Orientation to time 5  Orientation to Place 5  Registration 3  Attention/ Calculation 5  Recall 3  Language- name 2 objects 2  Language- repeat 1    Language- follow 3 step command 3  Language- read & follow direction 1  Write a sentence 1  Copy design 1  Total score 30  passed clock.   Says she is good from the neck up.  Health Maintenance  Topic Date Due  . DEXA SCAN  05/08/2000  . PNA vac Low Risk Adult (2 of 2 - PCV13) 09/24/2011  . INFLUENZA VACCINE  04/23/2016  . TETANUS/TDAP  09/23/2020  . ZOSTAVAX  Completed   Urinary incontinence:  Has been taking myrbetriq and HR went up over 100. Counting on her adult diapers.     Functional status:  Is independent.    Exercise:  Is not really exercising. Only her brain, she reports.  knowns she needs to keep those knees lubricated.  Has a recumbent cross trainer.  Says it's not hideous.    Diet:  Does not follow special diet aside from not adding salt.    Vision: 20/40 OD, 20/40 OS, and 20/30 OU.  Due for her ophtho visit.  Wears glasses.  Does fine reading.  Hearing:  Has declined.  Does not wear hearing aides.  Not interested in getting tested.  Feels she can still hear adequately if people are looking at her and speaking.  Does ok in group setting.  Dentition:  Feels like she's building an  annex on the dental office--crowns are needing replacement.   Chewing and swallowing ok.  Insomnia:  Has to jump through hoops to get her [place].    Eliquis could not be reduced due to weight.    Pain:  Knees hurt her.  Otherwise ok.  Not exercising.  Uses only tylenol here and there.  Would love to take aspirin, but has her gi bleeding history so not an option especially with eliquis.    Review of Systems:  Review of Systems  Constitutional: Negative for fever, chills and malaise/fatigue.  HENT: Positive for hearing loss.   Eyes: Negative for blurred vision.       Glasses  Respiratory: Negative for shortness of breath.   Cardiovascular: Negative for chest pain.  Gastrointestinal: Positive for constipation. Negative for abdominal pain, blood in stool and melena.       Rare laxative  use  Genitourinary: Negative for dysuria.  Musculoskeletal: Positive for joint pain and falls.       Bilateral knees  Skin: Negative for rash.  Neurological: Negative for dizziness.  Endo/Heme/Allergies: Bruises/bleeds easily.       On arms from eliquis  Psychiatric/Behavioral: Negative for depression and memory loss. The patient is not nervous/anxious and does not have insomnia.     Past Medical History  Diagnosis Date  . A-fib ([place])   . Hypertension   . Arthritis   . Cataracts, bilateral   . Other acariasis     peripherial neuropathy  . History of stomach ulcers   . GI bleed 06/2002  . UTI (lower urinary tract infection)     pt state uti w/ no pain  . PVC (premature ventricular contraction)   . Migraines   . Abdominal pain, unspecified site   . Diverticulosis of colon (without mention of hemorrhage)   . Hemorrhage of gastrointestinal tract, unspecified   . Unspecified hereditary and idiopathic peripheral neuropathy   . Osteoarthrosis, unspecified whether generalized or localized, lower leg   . Palpitations   . Depression   . Other and unspecified alcohol dependence, continuous drinking behavior 10/28/2012  . Nausea alone 10/28/2012  . Obesity, unspecified 10/27/2012  . Diverticulosis of colon (without mention of hemorrhage) 10/27/2012  . Insomnia, unspecified 10/27/2012  . Edema 10/27/2012  . Localized osteoarthrosis not specified whether primary or secondary, lower leg 05/31/2011  . Abnormality of gait 06/02/2013    Past Surgical History  Procedure Laterality Date  . Breast reduction surgery  12/23/2003  . Cholecystectomy  2005  . Cataracts    . Arthroscopic repair acl    . Tonsillectomy and adenoidectomy  1941  . Breast biopsy  1973    left    The patient has a family history of  shoulder  Allergies  Allergen Reactions  . Mirtazapine     tremor  . Nickel Other (See Comments)    inflammation  . Other     Prednisone causes sleep disturbances      Medication List         This list is accurate as of: 10/04/15 10:43 AM.  Always use your most recent med list.               acetaminophen 325 MG tablet  Commonly known as:  TYLENOL  Take 650 mg by mouth 2 (two) times daily as needed. For pain     ELIQUIS 5 MG Tabs tablet  Generic drug:  apixaban  TAKE 1 TABLET BY MOUTH TWICE DAILY     flecainide  50 MG tablet  Commonly known as:  TAMBOCOR  TAKE 1 TABLET BY MOUTH TWICE DAILY FOR ATRIAL FIBRILLATION     losartan 100 MG tablet  Commonly known as:  COZAAR  TAKE 1 TABLET BY MOUTH DAILY     metoprolol succinate 50 MG 24 hr tablet  Commonly known as:  TOPROL XL  Take 1 tablet (50 mg total) by mouth daily. Take with or immediately following a meal.     multivitamin with minerals Tabs tablet  Take 1 tablet by mouth daily.     promethazine 25 MG tablet  Commonly known as:  PHENERGAN  Take 25 mg by mouth daily as needed. For nausea and vomiting     sodium chloride 0.65 % Soln nasal spray  Commonly known as:  OCEAN  Place 1 spray into both nostrils daily as needed for congestion.     zolpidem 10 MG tablet  Commonly known as:  AMBIEN  TAKE 1 TABLET BY MOUTH EVERY DAY AT BEDTIME AS NEEDED FOR INSOMNIA        Physical Exam: Filed Vitals:   10/04/15 1007  BP: 128/72  Pulse: 72  Temp: 97.2 F (36.2 C)  TempSrc: Oral  Height: 5\' 7"  (1.702 m)  Weight: 192 lb (87.091 kg)  SpO2: 97%   Body mass index is 30.06 kg/(m^2). Physical Exam  Constitutional: She is oriented to person, place, and time. She appears well-developed and well-nourished.  HENT:  Head: Normocephalic and atraumatic.  Right Ear: External ear normal.  Left Ear: External ear normal.  Nose: Nose normal.  Mouth/Throat: Oropharynx is clear and moist. No oropharyngeal exudate.  Eyes: Conjunctivae and EOM are normal. Pupils are equal, round, and reactive to light.  glasses  Neck: Normal range of motion. Neck supple. No JVD present. No thyromegaly present.  Cardiovascular:  Normal rate, regular rhythm, normal heart sounds and intact distal pulses.   Pulmonary/Chest: Effort normal and breath sounds normal. No respiratory distress.  Abdominal: Soft. Bowel sounds are normal. She exhibits no distension and no mass. There is no tenderness.  Musculoskeletal: Normal range of motion. She exhibits no tenderness.  Mild warmth of right medial knee, bilateral swelling of knees  Lymphadenopathy:    She has no cervical adenopathy.  Neurological: She is alert and oriented to person, place, and time. She has normal reflexes. No cranial nerve deficit. She exhibits normal muscle tone.  Skin: Skin is warm and dry.  Psychiatric: She has a normal mood and affect. Her behavior is normal. Judgment and thought content normal.    Labs reviewed: Basic Metabolic Panel:  Recent Labs  10/03/15  NA 141  K 4.7  BUN 14  CREATININE 0.9   Liver Function Tests:  Recent Labs  10/03/15  AST 16  ALT 10  ALKPHOS 64   No results for input(s): LIPASE, AMYLASE in the last 8760 hours. No results for input(s): AMMONIA in the last 8760 hours. CBC:  Recent Labs  10/03/15  WBC 7.5  HGB 14.9  HCT 42  PLT 231   Lipid Panel:  Recent Labs  10/03/15  CHOL 171  HDL 63  LDLCALC 81  TRIG 136   Lab Results  Component Value Date   HGBA1C 5.3 10/03/2015    Assessment/Plan 1. Medicare annual wellness visit, subsequent -see hpi -prevnar given -refuses more cscopes  2. Insomnia -requested refills on her Lorrin Mais so granted - zolpidem (AMBIEN) 10 MG tablet; TAKE 1 TABLET BY MOUTH EVERY DAY AT BEDTIME AS NEEDED FOR INSOMNIA  Dispense: 30 tablet; Refill: 5  3. Nausea without vomiting - reason unclear--no correlation with certain foods, empty stomach or meds -cont prn phenergan - promethazine (PHENERGAN) 25 MG tablet; Take 1 tablet (25 mg total) by mouth daily as needed for nausea. For nausea and vomiting  Dispense: 30 tablet; Refill: 3  4. Primary osteoarthritis of both knees -R>L   -they give out on her and cause falls, but she does not want surgery at this point--seems she would be a pretty good candidate -cont tylenol  -encouraged exercise routine with her recumbent bike  5. Overactive bladder -myrbetriq caused tachycardia, she reports so she stopped it  6. Chronic atrial fibrillation (HCC) -continues on eliquis, flecainide and metoprolol succinate  7. Need for vaccination with 13-polyvalent pneumococcal conjugate vaccine - Pneumococcal conjugate vaccine 13-valent was given  Labs/tests ordered:  No new Next appt:  6 mos med mgt  Jaber Dunlow L. Skylinn Vialpando, D.O. [facility] [address], [place] [address] Cell Phone (Mon-Fri 8am-5pm):  [phone] On Call:  [phone] & follow prompts after 5pm & weekends Office Phone:  [phone] Office Fax:  [phone]

## 2015-10-13 ENCOUNTER — Encounter: Payer: Self-pay | Admitting: Internal Medicine

## 2015-11-06 ENCOUNTER — Other Ambulatory Visit: Payer: Self-pay | Admitting: Internal Medicine

## 2015-11-08 ENCOUNTER — Other Ambulatory Visit: Payer: Self-pay | Admitting: Internal Medicine

## 2015-11-18 ENCOUNTER — Emergency Department (HOSPITAL_COMMUNITY): Payer: Medicare Other

## 2015-11-18 ENCOUNTER — Emergency Department (HOSPITAL_COMMUNITY)
Admission: EM | Admit: 2015-11-18 | Discharge: 2015-11-18 | Disposition: A | Discharge status: Home / Self Care | Payer: Medicare Other | Attending: Emergency Medicine | Admitting: Emergency Medicine

## 2015-11-18 ENCOUNTER — Encounter (HOSPITAL_COMMUNITY): Payer: Self-pay

## 2015-11-18 DIAGNOSIS — E669 Obesity, unspecified: Secondary | ICD-10-CM | POA: Diagnosis not present

## 2015-11-18 DIAGNOSIS — M1990 Unspecified osteoarthritis, unspecified site: Secondary | ICD-10-CM | POA: Diagnosis not present

## 2015-11-18 DIAGNOSIS — S82831A Other fracture of upper and lower end of right fibula, initial encounter for closed fracture: Secondary | ICD-10-CM | POA: Diagnosis not present

## 2015-11-18 DIAGNOSIS — Y998 Other external cause status: Secondary | ICD-10-CM | POA: Insufficient documentation

## 2015-11-18 DIAGNOSIS — Z7901 Long term (current) use of anticoagulants: Secondary | ICD-10-CM | POA: Diagnosis not present

## 2015-11-18 DIAGNOSIS — Y9289 Other specified places as the place of occurrence of the external cause: Secondary | ICD-10-CM | POA: Insufficient documentation

## 2015-11-18 DIAGNOSIS — S8991XA Unspecified injury of right lower leg, initial encounter: Secondary | ICD-10-CM | POA: Diagnosis present

## 2015-11-18 DIAGNOSIS — Z87440 Personal history of urinary (tract) infections: Secondary | ICD-10-CM | POA: Insufficient documentation

## 2015-11-18 DIAGNOSIS — Z87891 Personal history of nicotine dependence: Secondary | ICD-10-CM | POA: Insufficient documentation

## 2015-11-18 DIAGNOSIS — I4891 Unspecified atrial fibrillation: Secondary | ICD-10-CM | POA: Diagnosis not present

## 2015-11-18 DIAGNOSIS — I10 Essential (primary) hypertension: Secondary | ICD-10-CM | POA: Insufficient documentation

## 2015-11-18 DIAGNOSIS — M79604 Pain in right leg: Secondary | ICD-10-CM | POA: Diagnosis not present

## 2015-11-18 DIAGNOSIS — G4700 Insomnia, unspecified: Secondary | ICD-10-CM | POA: Diagnosis not present

## 2015-11-18 DIAGNOSIS — Z8619 Personal history of other infectious and parasitic diseases: Secondary | ICD-10-CM | POA: Diagnosis not present

## 2015-11-18 DIAGNOSIS — S82491A Other fracture of shaft of right fibula, initial encounter for closed fracture: Secondary | ICD-10-CM | POA: Diagnosis not present

## 2015-11-18 DIAGNOSIS — Z8659 Personal history of other mental and behavioral disorders: Secondary | ICD-10-CM | POA: Insufficient documentation

## 2015-11-18 DIAGNOSIS — S82401A Unspecified fracture of shaft of right fibula, initial encounter for closed fracture: Secondary | ICD-10-CM

## 2015-11-18 DIAGNOSIS — G43909 Migraine, unspecified, not intractable, without status migrainosus: Secondary | ICD-10-CM | POA: Insufficient documentation

## 2015-11-18 DIAGNOSIS — W1839XA Other fall on same level, initial encounter: Secondary | ICD-10-CM | POA: Diagnosis not present

## 2015-11-18 DIAGNOSIS — S89201A Unspecified physeal fracture of upper end of right fibula, initial encounter for closed fracture: Secondary | ICD-10-CM | POA: Diagnosis not present

## 2015-11-18 DIAGNOSIS — Z79899 Other long term (current) drug therapy: Secondary | ICD-10-CM | POA: Insufficient documentation

## 2015-11-18 DIAGNOSIS — Y9389 Activity, other specified: Secondary | ICD-10-CM | POA: Insufficient documentation

## 2015-11-18 DIAGNOSIS — T148 Other injury of unspecified body region: Secondary | ICD-10-CM | POA: Diagnosis not present

## 2015-11-18 DIAGNOSIS — R259 Unspecified abnormal involuntary movements: Secondary | ICD-10-CM | POA: Diagnosis not present

## 2015-11-18 MED ORDER — HYDROCODONE-ACETAMINOPHEN 5-325 MG PO TABS
1.0000 | ORAL_TABLET | Freq: Once | ORAL | Status: AC
  Administered 2015-11-18: 1 via ORAL
  Filled 2015-11-18: qty 1

## 2015-11-18 MED ORDER — HYDROCODONE-ACETAMINOPHEN 5-325 MG PO TABS: 1.0000 | ORAL_TABLET | Freq: Four times a day (QID) | ORAL | Status: DC | PRN

## 2015-11-18 NOTE — Clinical Social Work Note (Signed)
Clinical Social Work Assessment  Patient Details  Name: [name] MRN: [id number] Date of Birth: Nov 02, 1932  Date of referral:  11/18/15               Reason for consult:  Other (Comment Required) (assess for needs)                Permission sought to share information with:  Facility Art therapist granted to share information::  Yes, Verbal Permission Granted  Name::        Agency::     Relationship::     Contact Information:     Housing/Transportation Living arrangements for the past 2 months:  [place] ([place]) Source of Information:  Patient Patient Interpreter Needed:  None Criminal Activity/Legal Involvement Pertinent to Current Situation/Hospitalization:    Significant Relationships:    Lives with:  Adult Children, Other (Comment) (grand daughter) Do you feel safe going back to the place where you live?  Yes Need for family participation in patient care:  No (Coment)  Care giving concerns:  No caregiver   Facilities manager / plan:  CSW met with pt at bedside to discuss discharge needs.  CSW prompted pt to discuss history and needs.  SW spoke with orthopedics who provided pt with a brace and had her stand up with her walker.  Per ortho pt has a cracked fibia and she may place weight on it with no complications to the crack and follow up with ortho for appointment next week.  CSW called and spoke with [facility] Juliann Pulse to provide information regarding pt needs.   [facility] has a rehab bed and stated they could accept pt today.  CSW met with pt and provided information regarding rehab.  CSW reported to MD requesting an order to admit into rehab.  CSW will provide discharge/[facility] paperwork to RN for pt transport.   Employment status:  Retired Forensic scientist:  Medicare PT Recommendations:  Not assessed at this time [place] / Referral to community resources:     Patient/Family's Response to  care:  Pt discussed living at [facility] for 15 years.  She reported that she has home health aides during the week that provide her cooking laundry and other needs.  Pt reported that she has supportive family but they are out of town this weekend.  Pt has a friend at bedside that stated she would assist pt with the care of her cat since she will be away at rehab.    Patient/Family's Understanding of and Emotional Response to Diagnosis, Current Treatment, and Prognosis:  Pt appears to understand that she needs 24 hours assistance and stated that when she did PT in past it helped Pt hopeful that rehab would get her strength back so she may return to her independent living.  Emotional Assessment Appearance:  Appears younger than stated age Attitude/Demeanor/Rapport:   (cooperative) Affect (typically observed):  Accepting Orientation:  Oriented to Self, Oriented to Place, Oriented to  Time, Oriented to Situation Alcohol / Substance use:    Psych involvement (Current and /or in the community):  No (Comment)  Discharge Needs  Concerns to be addressed:    Readmission within the last 30 days:    Current discharge risk:    Barriers to Discharge:      Carlean Jews, LCSW 11/18/2015, 4:18 PM

## 2015-11-18 NOTE — ED Provider Notes (Signed)
CSN: [id number]     Arrival date & time 11/18/15  1243 History   First MD Initiated Contact with Patient 11/18/15 1303     Chief Complaint  Patient presents with  . Fall     (Consider location/radiation/quality/duration/timing/severity/associated sxs/prior Treatment) Patient is a 82 y.o. female presenting with fall. The history is provided by the patient (Patient states she fell yesterday in her right knee).  Fall This is a new problem. The current episode started yesterday. The problem occurs constantly. The problem has not changed since onset.Pertinent negatives include no chest pain, no abdominal pain and no headaches. Exacerbated by: Movement. Nothing relieves the symptoms.    Past Medical History  Diagnosis Date  . A-fib ([place])   . Hypertension   . Arthritis   . Cataracts, bilateral   . Other acariasis     peripherial neuropathy  . History of stomach ulcers   . GI bleed 06/2002  . UTI (lower urinary tract infection)     pt state uti w/ no pain  . PVC (premature ventricular contraction)   . Migraines   . Abdominal pain, unspecified site   . Diverticulosis of colon (without mention of hemorrhage)   . Hemorrhage of gastrointestinal tract, unspecified   . Unspecified hereditary and idiopathic peripheral neuropathy   . Osteoarthrosis, unspecified whether generalized or localized, lower leg   . Palpitations   . Depression   . Other and unspecified alcohol dependence, continuous drinking behavior 10/28/2012  . Nausea alone 10/28/2012  . Obesity, unspecified 10/27/2012  . Diverticulosis of colon (without mention of hemorrhage) 10/27/2012  . Insomnia, unspecified 10/27/2012  . Edema 10/27/2012  . Localized osteoarthrosis not specified whether primary or secondary, lower leg 05/31/2011  . Abnormality of gait 06/02/2013   Past Surgical History  Procedure Laterality Date  . Breast reduction surgery  12/23/2003  . Cholecystectomy  2005  . Cataracts    . Arthroscopic repair acl    .  Tonsillectomy and adenoidectomy  1941  . Breast biopsy  1973    left   History reviewed. No pertinent family history. Social History  Substance Use Topics  . Smoking status: Former Smoker    Types: Cigarettes    Quit date: 10/09/1983  . Smokeless tobacco: Never Used  . Alcohol Use: 0.0 oz/week    1-2 Shots of liquor per week     Comment: daily  scotch    OB History    No data available     Review of Systems  Constitutional: Negative for appetite change and fatigue.  HENT: Negative for congestion, ear discharge and sinus pressure.   Eyes: Negative for discharge.  Respiratory: Negative for cough.   Cardiovascular: Negative for chest pain.  Gastrointestinal: Negative for abdominal pain and diarrhea.  Genitourinary: Negative for frequency and hematuria.  Musculoskeletal: Negative for back pain.       Right knee pain  Skin: Negative for rash.  Neurological: Negative for seizures and headaches.  Psychiatric/Behavioral: Negative for hallucinations.      Allergies  Mirtazapine; Nickel; and Other  Home Medications   Prior to Admission medications   Medication Sig Start Date End Date Taking? Authorizing Provider  acetaminophen (TYLENOL) 325 MG tablet Take 650 mg by mouth 2 (two) times daily as needed. For pain   Yes Historical Provider, MD  ELIQUIS 5 MG TABS tablet TAKE 1 TABLET BY MOUTH TWICE DAILY 01/23/15  Yes Tiffany L Reed, DO  flecainide (TAMBOCOR) 50 MG tablet TAKE 1 TABLET BY MOUTH TWICE  DAILY FOR ATRIAL FIBRILLATION 11/08/15  Yes Tiffany L Reed, DO  losartan (COZAAR) 100 MG tablet TAKE 1 TABLET BY MOUTH DAILY 11/06/15  Yes Tiffany L Reed, DO  metoprolol succinate (TOPROL XL) 50 MG 24 hr tablet Take 1 tablet (50 mg total) by mouth daily. Take with or immediately following a meal. 06/23/15  Yes Peter M Martinique, MD  Multiple Vitamin (MULTIVITAMIN WITH MINERALS) TABS tablet Take 1 tablet by mouth daily.   Yes Historical Provider, MD  promethazine (PHENERGAN) 25 MG tablet Take 1  tablet (25 mg total) by mouth daily as needed for nausea. For nausea and vomiting 10/04/15  Yes Tiffany L Reed, DO  sodium chloride (OCEAN) 0.65 % SOLN nasal spray Place 1 spray into both nostrils daily as needed for congestion.   Yes Historical Provider, MD  zolpidem (AMBIEN) 10 MG tablet TAKE 1 TABLET BY MOUTH EVERY DAY AT BEDTIME AS NEEDED FOR INSOMNIA 10/04/15  Yes Tiffany L Reed, DO  HYDROcodone-acetaminophen (NORCO/VICODIN) 5-325 MG tablet Take 1 tablet by mouth every 6 (six) hours as needed for moderate pain. 11/18/15   Milton Ferguson, MD   BP 151/76 mmHg  Pulse 83  Temp(Src) 98.7 F (37.1 C) (Oral)  Resp 18  SpO2 91% Physical Exam  Constitutional: She is oriented to person, place, and time. She appears well-developed.  HENT:  Head: Normocephalic.  Eyes: Conjunctivae and EOM are normal. No scleral icterus.  Neck: Neck supple. No thyromegaly present.  Cardiovascular: Normal rate and regular rhythm.  Exam reveals no gallop and no friction rub.   No murmur heard. Pulmonary/Chest: No stridor. She has no wheezes. She has no rales. She exhibits no tenderness.  Abdominal: She exhibits no distension. There is no tenderness. There is no rebound.  Musculoskeletal:  Swollen tender right knee neurovascular exam normal  Lymphadenopathy:    She has no cervical adenopathy.  Neurological: She is oriented to person, place, and time. She exhibits normal muscle tone. Coordination normal.  Skin: No rash noted. No erythema.  Psychiatric: She has a normal mood and affect. Her behavior is normal.    ED Course  Procedures (including critical care time) Labs Review Labs Reviewed - No data to display  Imaging Review Dg Knee Complete 4 Views Right  11/18/2015  CLINICAL DATA:  Fall at home last night. Injury to right knee. Pain and swelling. EXAM: RIGHT KNEE - COMPLETE 4+ VIEW COMPARISON:  None FINDINGS: Advanced degenerative changes in the right knee, most pronounced in the medial and patellofemoral  compartments. There is an oblique fracture through the right fibular neck. Small to moderate joint effusion. No subluxation or dislocation. IMPRESSION: Nondisplaced right fibular neck fracture. Advanced degenerative changes.  Small to moderate joint effusion. Electronically Signed   By: Rolm Baptise M.D.   On: 11/18/2015 13:38   I have personally reviewed and evaluated these images and lab results as part of my medical decision-making.   EKG Interpretation None     Patient has a fractured fibula on the right knee area I spoke with Dr. Sharol Given and he recommended knee immobilizer limited pressure on that leg and follow-up in a week MDM   Final diagnoses:  Fibula fracture, right, closed, initial encounter   Patient with the superior fibula fracture she is put in a knee immobilizer given pain medicines told to weight-bear as tolerated and will follow-up with orthopedics next week    Milton Ferguson, MD 11/19/15 [phone]

## 2015-11-18 NOTE — ED Notes (Signed)
Patient presents to ED by EMS with a fall from last night. Rt knee pain that is swollen and painful. Patient uses walker. Denies LOC or hitting head. Patient lives at home.

## 2015-11-18 NOTE — Clinical Social Work Note (Signed)
CSW called [facility] for pt transport to [place].  Ptar form provided to RN.  Marland KitchenDede Query, LCSW [facility] Clinical Social Worker - Weekend Coverage cell #: [phone]

## 2015-11-18 NOTE — ED Notes (Signed)
SW at bedside.

## 2015-11-18 NOTE — ED Notes (Signed)
Patient took 500mg  of tylenol prior to arrival.

## 2015-11-18 NOTE — ED Notes (Signed)
Bed: [id number] Expected date:  Expected time:  Means of arrival:  Comments: EMS- 82 yo fall, R knee pain

## 2015-11-18 NOTE — ED Notes (Signed)
Attempted to call [facility] facility to give report and no one answer. Left message for facility to return call.

## 2015-11-18 NOTE — ED Notes (Signed)
Ortho tech at bedside 

## 2015-11-18 NOTE — Discharge Instructions (Signed)
Follow up with [facility] next week. Try to put minimal weight on that right leg,    ADMIT TO REHAB

## 2015-11-18 NOTE — ED Notes (Signed)
[facility] arrived to transport to facility.

## 2015-11-18 NOTE — ED Notes (Signed)
Awake. Verbally responsive. A/O x4. Resp even and unlabored. No audible adventitious breath sounds noted. ABC's intact.  

## 2015-11-21 ENCOUNTER — Encounter: Payer: Self-pay | Admitting: Internal Medicine

## 2015-11-21 ENCOUNTER — Non-Acute Institutional Stay (SKILLED_NURSING_FACILITY): Payer: Medicare Other | Admitting: Internal Medicine

## 2015-11-21 DIAGNOSIS — I482 Chronic atrial fibrillation, unspecified: Secondary | ICD-10-CM

## 2015-11-21 DIAGNOSIS — F10220 Alcohol dependence with intoxication, uncomplicated: Secondary | ICD-10-CM

## 2015-11-21 DIAGNOSIS — I10 Essential (primary) hypertension: Secondary | ICD-10-CM

## 2015-11-21 DIAGNOSIS — G4700 Insomnia, unspecified: Secondary | ICD-10-CM

## 2015-11-21 DIAGNOSIS — S82401A Unspecified fracture of shaft of right fibula, initial encounter for closed fracture: Secondary | ICD-10-CM | POA: Diagnosis not present

## 2015-11-21 DIAGNOSIS — Z789 Other specified health status: Secondary | ICD-10-CM | POA: Diagnosis not present

## 2015-11-21 DIAGNOSIS — R6889 Other general symptoms and signs: Secondary | ICD-10-CM | POA: Diagnosis not present

## 2015-11-21 DIAGNOSIS — R251 Tremor, unspecified: Secondary | ICD-10-CM | POA: Diagnosis not present

## 2015-11-21 DIAGNOSIS — M170 Bilateral primary osteoarthritis of knee: Secondary | ICD-10-CM

## 2015-11-21 DIAGNOSIS — N3281 Overactive bladder: Secondary | ICD-10-CM | POA: Diagnosis not present

## 2015-11-21 MED ORDER — HYDROCODONE-ACETAMINOPHEN 5-325 MG PO TABS: ORAL_TABLET | ORAL | Status: DC

## 2015-11-21 NOTE — Progress Notes (Signed)
Patient ID: [name], female   DOB: 1932-11-02, 82 y.o.   MRN: [id number]  Provider:  Rexene Edison. Mariea Clonts, D.O., C.M.D. Location:  [facility] Room Number: [place] of Service:  SNF (31)  PCP: Hollace Kinnier, DO Patient Care Team: Gayland Curry, DO as PCP - General (Geriatric Medicine) Peter M Martinique, MD as Attending Physician (Cardiology) [place], MD as Consulting Physician (Neurology)  Extended Emergency Contact Information Primary Emergency Contact: Darden Palmer [place] of [place] Phone: [phone] Mobile Phone: [phone] Relation: Relative Secondary Emergency Contact: [place] Work Phone: [phone] Mobile Phone: [phone] Relation: None  Code Status: DNR Goals of Care: Advanced Directive information Advanced Directives 11/21/2015  Does patient have an advance directive? Yes  Type of Advance Directive Living will;Healthcare Power of [place];Out of facility DNR (pink MOST or yellow form)  Does patient want to make changes to advanced directive? No - Patient declined  Copy of advanced directive(s) in chart? Yes  Pre-existing out of facility DNR order (yellow form or pink MOST form) Yellow form placed in chart (order not valid for inpatient use)   Chief Complaint  Patient presents with  . New Admit To SNF    Rehab following ED 11/18/15 patient had fallen 11/17/15 on right knee, superior fibula fracture.     HPI: Patient is a 82 y.o. female seen today for admission to [facility] s/p ED visit from 11/18/15 s/p fall with right fibular fracture.  She had fallen on 11/17/15 on her right knee when intoxicated.  She underwent xrays which showed a nondisplaced right fibular neck fracture, advanced degenerative changes of the knee and a small to moderate joint effusion.  She normally ambulates with a walker and lives in [place].  Dr. Sharol Given recommended that she  use a knee immobilizer on her right knee and put limited pressure on the leg with f/u in 1 week.  Past Medical History  Diagnosis Date  . A-fib ([place])   . Hypertension   . Arthritis   . Cataracts, bilateral   . Other acariasis     peripherial neuropathy  . History of stomach ulcers   . GI bleed 06/2002  . UTI (lower urinary tract infection)     pt state uti w/ no pain  . PVC (premature ventricular contraction)   . Migraines   . Abdominal pain, unspecified site   . Diverticulosis of colon (without mention of hemorrhage)   . Hemorrhage of gastrointestinal tract, unspecified   . Unspecified hereditary and idiopathic peripheral neuropathy   . Osteoarthrosis, unspecified whether generalized or localized, lower leg   . Palpitations   . Depression   . Other and unspecified alcohol dependence, continuous drinking behavior 10/28/2012  . Nausea alone 10/28/2012  . Obesity, unspecified 10/27/2012  . Diverticulosis of colon (without mention of hemorrhage) 10/27/2012  . Insomnia, unspecified 10/27/2012  . Edema 10/27/2012  . Localized osteoarthrosis not specified whether primary or secondary, lower leg 05/31/2011  . Abnormality of gait 06/02/2013   Past Surgical History  Procedure Laterality Date  . Breast reduction surgery  12/23/2003  . Cholecystectomy  2005  . Cataracts    . Arthroscopic repair acl    . Tonsillectomy and adenoidectomy  1941  . Breast biopsy  1973    left    reports that she quit smoking about 32 years ago. Her smoking use included Cigarettes. She has never used smokeless tobacco. She reports that  she drinks alcohol. She reports that she does not use illicit drugs. Social History   Social History  . Marital Status: Widowed    Spouse Name: N/A  . Number of Children: N/A  . Years of Education: N/A   Occupational History  . retired Marine scientist    Social History Main Topics  . Smoking status: Former Smoker    Types: Cigarettes    Quit date: 10/09/1983  . Smokeless tobacco: Never  Used  . Alcohol Use: 0.0 oz/week    1-2 Shots of liquor per week     Comment: daily  scotch   . Drug Use: No  . Sexual Activity: No   Other Topics Concern  . Not on file   Social History Narrative   Lives at [facility], in [place] -2004   Stopped smoking [place], DNR, Living Will   [place] with walker   Alcohol scotch daily    Exercise none    Functional Status Survey: Is the patient deaf or have difficulty hearing?: No Does the patient have difficulty seeing, even when wearing glasses/contacts?: No (glasses) Does the patient have difficulty concentrating, remembering, or making decisions?: No Does the patient have difficulty walking or climbing stairs?: Yes Does the patient have difficulty dressing or bathing?: Yes Does the patient have difficulty doing errands alone such as visiting a doctor's office or shopping?: Yes  History reviewed. No pertinent family history.  Health Maintenance  Topic Date Due  . DEXA SCAN  05/08/2000  . INFLUENZA VACCINE  04/23/2016  . TETANUS/TDAP  09/23/2020  . ZOSTAVAX  Completed  . PNA vac Low Risk Adult  Completed    Allergies  Allergen Reactions  . Mirtazapine     tremor  . Nickel Other (See Comments)    inflammation  . Other     Prednisone causes sleep disturbances      Medication List       This list is accurate as of: 11/21/15  1:59 PM.  Always use your most recent med list.               acetaminophen 325 MG tablet  Commonly known as:  TYLENOL  Take 650 mg by mouth every 4 (four) hours as needed. For pain     ELIQUIS 5 MG Tabs tablet  Generic drug:  apixaban  TAKE 1 TABLET BY MOUTH TWICE DAILY     flecainide 50 MG tablet  Commonly known as:  TAMBOCOR  TAKE 1 TABLET BY MOUTH TWICE DAILY FOR ATRIAL FIBRILLATION     HYDROcodone-acetaminophen 5-325 MG tablet  Commonly known as:  NORCO/VICODIN  Take 1 tablet by mouth every 6 (six) hours as needed for moderate pain.     losartan 100 MG tablet  Commonly  known as:  COZAAR  TAKE 1 TABLET BY MOUTH DAILY     metoprolol succinate 50 MG 24 hr tablet  Commonly known as:  TOPROL XL  Take 1 tablet (50 mg total) by mouth daily. Take with or immediately following a meal.     multivitamin with minerals Tabs tablet  Take 1 tablet by mouth daily.     promethazine 25 MG tablet  Commonly known as:  PHENERGAN  Take 1 tablet (25 mg total) by mouth daily as needed for nausea. For nausea and vomiting     sodium chloride 0.65 % Soln nasal spray  Commonly known as:  OCEAN  Place 1 spray into both nostrils daily as needed for congestion.  zolpidem 10 MG tablet  Commonly known as:  AMBIEN  TAKE 1 TABLET BY MOUTH EVERY DAY AT BEDTIME AS NEEDED FOR INSOMNIA        Review of Systems  Constitutional: Negative for fever, chills and malaise/fatigue.  HENT: Negative for congestion and hearing loss.   Eyes:       Glasses  Respiratory: Negative for shortness of breath.   Cardiovascular: Positive for leg swelling. Negative for chest pain and palpitations.       Right leg and foot  Gastrointestinal: Positive for constipation. Negative for abdominal pain, diarrhea, blood in stool and melena.  Genitourinary: Positive for urgency. Negative for dysuria and frequency.  Musculoskeletal: Positive for falls.       Right leg pain  Skin: Negative for rash.  Neurological: Negative for dizziness and weakness.  Endo/Heme/Allergies: Bruises/bleeds easily.  Psychiatric/Behavioral: Negative for memory loss. The patient is nervous/anxious and has insomnia.     Filed Vitals:   11/21/15 1011  BP: 122/67  Pulse: 74  Temp: 98.6 F (37 C)  Resp: 18  Height: 5\' 7"  (1.702 m)  Weight: 192 lb (87.091 kg)  SpO2: 95%   Body mass index is 30.06 kg/(m^2). Physical Exam  Constitutional: She is oriented to person, place, and time. She appears well-developed and well-nourished. No distress.  HENT:  Head: Normocephalic and atraumatic.  Right Ear: External ear normal.    Left Ear: External ear normal.  Nose: Nose normal.  Mouth/Throat: Oropharynx is clear and moist. No oropharyngeal exudate.  Eyes: Conjunctivae and EOM are normal. Pupils are equal, round, and reactive to light.  Neck: Normal range of motion. Neck supple. No JVD present.  Cardiovascular: Normal rate, regular rhythm, normal heart sounds and intact distal pulses.   Pulmonary/Chest: Effort normal and breath sounds normal. No respiratory distress.  Abdominal: Soft. Bowel sounds are normal. She exhibits no distension and no mass. There is no tenderness.  Musculoskeletal: She exhibits edema and tenderness.  Right proximal lower lateral leg; right knee effusion, warmth, tenderness, no erythema; wearing immobilizer with ace wrap beneath  Lymphadenopathy:    She has no cervical adenopathy.  Neurological: She is alert and oriented to person, place, and time. No cranial nerve deficit.  Skin: Skin is warm and dry.  Abrasion of right first metatarsal region dorsal surface; ecchymoses of right shoulder  Psychiatric: She has a normal mood and affect. Her behavior is normal. Judgment and thought content normal.    Labs reviewed: Basic Metabolic Panel:  Recent Labs  10/03/15  NA 141  K 4.7  BUN 14  CREATININE 0.9   Liver Function Tests:  Recent Labs  10/03/15  AST 16  ALT 10  ALKPHOS 64   No results for input(s): LIPASE, AMYLASE in the last 8760 hours. No results for input(s): AMMONIA in the last 8760 hours. CBC:  Recent Labs  10/03/15  WBC 7.5  HGB 14.9  HCT 42  PLT 231   Cardiac Enzymes: No results for input(s): CKTOTAL, CKMB, CKMBINDEX, TROPONINI in the last 8760 hours. BNP: Invalid input(s): POCBNP Lab Results  Component Value Date   HGBA1C 5.3 10/03/2015   Lab Results  Component Value Date   TSH 1.215 06/05/2013   Lab Results  Component Value Date   [id number] 06/05/2013   No results found for: FOLATE No results found for: IRON, TIBC, FERRITIN  Imaging and  Procedures obtained prior to SNF admission: Dg Knee Complete 4 Views Right  11/18/2015  CLINICAL DATA:  Fall at  home last night. Injury to right knee. Pain and swelling. EXAM: RIGHT KNEE - COMPLETE 4+ VIEW COMPARISON:  None FINDINGS: Advanced degenerative changes in the right knee, most pronounced in the medial and patellofemoral compartments. There is an oblique fracture through the right fibular neck. Small to moderate joint effusion. No subluxation or dislocation. IMPRESSION: Nondisplaced right fibular neck fracture. Advanced degenerative changes.  Small to moderate joint effusion. Electronically Signed   By: Rolm Baptise M.D.   On: 11/18/2015 13:38    Assessment/Plan 1. Closed fibular fracture, right, initial encounter -currently only light weightbearing per notes from PA working with Dr. Sharol Given at ED -pt upset that she is not able to ambulate at this time--says she's able to do touch transfers -cont as per instructions in d/c paperwork -cont immobilizer and f/u with ortho as planned -cont vicodin for more severe pain, tylenol for mild pain -PT/OT  2. Alcohol dependence with uncomplicated intoxication ([place]) -she has not requested alcohol here with meals so this must not be daily -I am told alcohol was involved in her fall, but she reports she has no more than 2 drinks  3. Primary osteoarthritis of both knees -has baseline bilateral knee pain -cont tylenol prn, but not to exceed 3 total grams of apap per day along with her vicodin  4. Chronic atrial fibrillation (HCC) -continues on flecainide, eliquis, toprol xl, rate controlled  5. Overactive bladder -not on meds due to side effects -does have frequency   6. Insomnia -uses ambien and I cannot get her tapered off  7. Essential hypertension -bp at goal with losartan, toprol, cont same  8. Tremor -none noted today  9. Active advance directive - DNR (Do Not Resuscitate)  Family/ staff Communication: discussed with rehab  nursing  Labs/tests ordered:  No new  Pegah Segel L. Berline Semrad, D.O. [facility] [address], [place] [address] Cell Phone (Mon-Fri 8am-5pm):  [phone] On Call:  [phone] & follow prompts after 5pm & weekends Office Phone:  [phone] Office Fax:  [phone]

## 2015-11-22 DIAGNOSIS — M84463A Pathological fracture, right fibula, initial encounter for fracture: Secondary | ICD-10-CM | POA: Diagnosis not present

## 2015-11-22 DIAGNOSIS — R278 Other lack of coordination: Secondary | ICD-10-CM | POA: Diagnosis not present

## 2015-11-22 DIAGNOSIS — Z9181 History of falling: Secondary | ICD-10-CM | POA: Diagnosis not present

## 2015-11-22 DIAGNOSIS — Z4789 Encounter for other orthopedic aftercare: Secondary | ICD-10-CM | POA: Diagnosis not present

## 2015-11-22 DIAGNOSIS — M25561 Pain in right knee: Secondary | ICD-10-CM | POA: Diagnosis not present

## 2015-11-22 DIAGNOSIS — M6281 Muscle weakness (generalized): Secondary | ICD-10-CM | POA: Diagnosis not present

## 2015-11-22 DIAGNOSIS — R2689 Other abnormalities of gait and mobility: Secondary | ICD-10-CM | POA: Diagnosis not present

## 2015-11-23 DIAGNOSIS — S82831A Other fracture of upper and lower end of right fibula, initial encounter for closed fracture: Secondary | ICD-10-CM | POA: Diagnosis not present

## 2015-11-23 DIAGNOSIS — M6281 Muscle weakness (generalized): Secondary | ICD-10-CM | POA: Diagnosis not present

## 2015-11-23 DIAGNOSIS — R2689 Other abnormalities of gait and mobility: Secondary | ICD-10-CM | POA: Diagnosis not present

## 2015-11-23 DIAGNOSIS — Z4789 Encounter for other orthopedic aftercare: Secondary | ICD-10-CM | POA: Diagnosis not present

## 2015-11-23 DIAGNOSIS — M84463A Pathological fracture, right fibula, initial encounter for fracture: Secondary | ICD-10-CM | POA: Diagnosis not present

## 2015-11-23 DIAGNOSIS — M25561 Pain in right knee: Secondary | ICD-10-CM | POA: Diagnosis not present

## 2015-11-23 DIAGNOSIS — R278 Other lack of coordination: Secondary | ICD-10-CM | POA: Diagnosis not present

## 2015-11-24 DIAGNOSIS — M25561 Pain in right knee: Secondary | ICD-10-CM | POA: Diagnosis not present

## 2015-11-24 DIAGNOSIS — Z4789 Encounter for other orthopedic aftercare: Secondary | ICD-10-CM | POA: Diagnosis not present

## 2015-11-24 DIAGNOSIS — M6281 Muscle weakness (generalized): Secondary | ICD-10-CM | POA: Diagnosis not present

## 2015-11-24 DIAGNOSIS — M84463A Pathological fracture, right fibula, initial encounter for fracture: Secondary | ICD-10-CM | POA: Diagnosis not present

## 2015-11-24 DIAGNOSIS — R278 Other lack of coordination: Secondary | ICD-10-CM | POA: Diagnosis not present

## 2015-11-24 DIAGNOSIS — R2689 Other abnormalities of gait and mobility: Secondary | ICD-10-CM | POA: Diagnosis not present

## 2015-11-27 DIAGNOSIS — M25561 Pain in right knee: Secondary | ICD-10-CM | POA: Diagnosis not present

## 2015-11-27 DIAGNOSIS — Z4789 Encounter for other orthopedic aftercare: Secondary | ICD-10-CM | POA: Diagnosis not present

## 2015-11-27 DIAGNOSIS — M6281 Muscle weakness (generalized): Secondary | ICD-10-CM | POA: Diagnosis not present

## 2015-11-27 DIAGNOSIS — R2689 Other abnormalities of gait and mobility: Secondary | ICD-10-CM | POA: Diagnosis not present

## 2015-11-27 DIAGNOSIS — R278 Other lack of coordination: Secondary | ICD-10-CM | POA: Diagnosis not present

## 2015-11-27 DIAGNOSIS — M84463A Pathological fracture, right fibula, initial encounter for fracture: Secondary | ICD-10-CM | POA: Diagnosis not present

## 2015-11-28 DIAGNOSIS — M25561 Pain in right knee: Secondary | ICD-10-CM | POA: Diagnosis not present

## 2015-11-28 DIAGNOSIS — M84463A Pathological fracture, right fibula, initial encounter for fracture: Secondary | ICD-10-CM | POA: Diagnosis not present

## 2015-11-28 DIAGNOSIS — R278 Other lack of coordination: Secondary | ICD-10-CM | POA: Diagnosis not present

## 2015-11-28 DIAGNOSIS — Z4789 Encounter for other orthopedic aftercare: Secondary | ICD-10-CM | POA: Diagnosis not present

## 2015-11-28 DIAGNOSIS — M6281 Muscle weakness (generalized): Secondary | ICD-10-CM | POA: Diagnosis not present

## 2015-11-28 DIAGNOSIS — R2689 Other abnormalities of gait and mobility: Secondary | ICD-10-CM | POA: Diagnosis not present

## 2015-11-29 DIAGNOSIS — M25561 Pain in right knee: Secondary | ICD-10-CM | POA: Diagnosis not present

## 2015-11-29 DIAGNOSIS — M84463A Pathological fracture, right fibula, initial encounter for fracture: Secondary | ICD-10-CM | POA: Diagnosis not present

## 2015-11-29 DIAGNOSIS — M6281 Muscle weakness (generalized): Secondary | ICD-10-CM | POA: Diagnosis not present

## 2015-11-29 DIAGNOSIS — R278 Other lack of coordination: Secondary | ICD-10-CM | POA: Diagnosis not present

## 2015-11-29 DIAGNOSIS — Z4789 Encounter for other orthopedic aftercare: Secondary | ICD-10-CM | POA: Diagnosis not present

## 2015-11-29 DIAGNOSIS — R2689 Other abnormalities of gait and mobility: Secondary | ICD-10-CM | POA: Diagnosis not present

## 2015-11-30 ENCOUNTER — Non-Acute Institutional Stay (SKILLED_NURSING_FACILITY): Payer: Medicare Other | Admitting: Adult Health

## 2015-11-30 DIAGNOSIS — F1020 Alcohol dependence, uncomplicated: Secondary | ICD-10-CM | POA: Diagnosis not present

## 2015-11-30 DIAGNOSIS — R278 Other lack of coordination: Secondary | ICD-10-CM | POA: Diagnosis not present

## 2015-11-30 DIAGNOSIS — Z4789 Encounter for other orthopedic aftercare: Secondary | ICD-10-CM | POA: Diagnosis not present

## 2015-11-30 DIAGNOSIS — M6281 Muscle weakness (generalized): Secondary | ICD-10-CM | POA: Diagnosis not present

## 2015-11-30 DIAGNOSIS — N3281 Overactive bladder: Secondary | ICD-10-CM

## 2015-11-30 DIAGNOSIS — I480 Paroxysmal atrial fibrillation: Secondary | ICD-10-CM | POA: Diagnosis not present

## 2015-11-30 DIAGNOSIS — S82401S Unspecified fracture of shaft of right fibula, sequela: Secondary | ICD-10-CM | POA: Diagnosis not present

## 2015-11-30 DIAGNOSIS — M170 Bilateral primary osteoarthritis of knee: Secondary | ICD-10-CM

## 2015-11-30 DIAGNOSIS — M84463A Pathological fracture, right fibula, initial encounter for fracture: Secondary | ICD-10-CM | POA: Diagnosis not present

## 2015-11-30 DIAGNOSIS — M25561 Pain in right knee: Secondary | ICD-10-CM | POA: Diagnosis not present

## 2015-11-30 DIAGNOSIS — G4700 Insomnia, unspecified: Secondary | ICD-10-CM

## 2015-11-30 DIAGNOSIS — R2689 Other abnormalities of gait and mobility: Secondary | ICD-10-CM | POA: Diagnosis not present

## 2015-11-30 DIAGNOSIS — I10 Essential (primary) hypertension: Secondary | ICD-10-CM | POA: Diagnosis not present

## 2015-12-01 DIAGNOSIS — M25561 Pain in right knee: Secondary | ICD-10-CM | POA: Diagnosis not present

## 2015-12-01 DIAGNOSIS — Z4789 Encounter for other orthopedic aftercare: Secondary | ICD-10-CM | POA: Diagnosis not present

## 2015-12-01 DIAGNOSIS — R278 Other lack of coordination: Secondary | ICD-10-CM | POA: Diagnosis not present

## 2015-12-01 DIAGNOSIS — R2689 Other abnormalities of gait and mobility: Secondary | ICD-10-CM | POA: Diagnosis not present

## 2015-12-01 DIAGNOSIS — M84463A Pathological fracture, right fibula, initial encounter for fracture: Secondary | ICD-10-CM | POA: Diagnosis not present

## 2015-12-01 DIAGNOSIS — M6281 Muscle weakness (generalized): Secondary | ICD-10-CM | POA: Diagnosis not present

## 2015-12-04 ENCOUNTER — Encounter (HOSPITAL_COMMUNITY): Payer: Self-pay | Admitting: Emergency Medicine

## 2015-12-04 ENCOUNTER — Emergency Department (HOSPITAL_COMMUNITY): Payer: Medicare Other

## 2015-12-04 ENCOUNTER — Emergency Department (HOSPITAL_COMMUNITY)
Admission: EM | Admit: 2015-12-04 | Discharge: 2015-12-04 | Disposition: A | Discharge status: Home / Self Care | Payer: Medicare Other | Attending: Emergency Medicine | Admitting: Emergency Medicine

## 2015-12-04 ENCOUNTER — Encounter: Payer: Self-pay | Admitting: Adult Health

## 2015-12-04 DIAGNOSIS — Z79899 Other long term (current) drug therapy: Secondary | ICD-10-CM | POA: Diagnosis not present

## 2015-12-04 DIAGNOSIS — Z7901 Long term (current) use of anticoagulants: Secondary | ICD-10-CM | POA: Diagnosis not present

## 2015-12-04 DIAGNOSIS — Z87440 Personal history of urinary (tract) infections: Secondary | ICD-10-CM | POA: Insufficient documentation

## 2015-12-04 DIAGNOSIS — M84463A Pathological fracture, right fibula, initial encounter for fracture: Secondary | ICD-10-CM | POA: Diagnosis not present

## 2015-12-04 DIAGNOSIS — R05 Cough: Secondary | ICD-10-CM | POA: Insufficient documentation

## 2015-12-04 DIAGNOSIS — E669 Obesity, unspecified: Secondary | ICD-10-CM | POA: Insufficient documentation

## 2015-12-04 DIAGNOSIS — Z8659 Personal history of other mental and behavioral disorders: Secondary | ICD-10-CM | POA: Diagnosis not present

## 2015-12-04 DIAGNOSIS — R51 Headache: Secondary | ICD-10-CM | POA: Diagnosis not present

## 2015-12-04 DIAGNOSIS — M1990 Unspecified osteoarthritis, unspecified site: Secondary | ICD-10-CM | POA: Diagnosis not present

## 2015-12-04 DIAGNOSIS — R278 Other lack of coordination: Secondary | ICD-10-CM | POA: Diagnosis not present

## 2015-12-04 DIAGNOSIS — M25561 Pain in right knee: Secondary | ICD-10-CM | POA: Diagnosis not present

## 2015-12-04 DIAGNOSIS — R509 Fever, unspecified: Secondary | ICD-10-CM | POA: Insufficient documentation

## 2015-12-04 DIAGNOSIS — Z87891 Personal history of nicotine dependence: Secondary | ICD-10-CM | POA: Insufficient documentation

## 2015-12-04 DIAGNOSIS — R2689 Other abnormalities of gait and mobility: Secondary | ICD-10-CM | POA: Diagnosis not present

## 2015-12-04 DIAGNOSIS — I10 Essential (primary) hypertension: Secondary | ICD-10-CM | POA: Diagnosis not present

## 2015-12-04 DIAGNOSIS — R519 Headache, unspecified: Secondary | ICD-10-CM

## 2015-12-04 DIAGNOSIS — I4891 Unspecified atrial fibrillation: Secondary | ICD-10-CM | POA: Diagnosis not present

## 2015-12-04 DIAGNOSIS — G4700 Insomnia, unspecified: Secondary | ICD-10-CM | POA: Diagnosis not present

## 2015-12-04 DIAGNOSIS — Z8719 Personal history of other diseases of the digestive system: Secondary | ICD-10-CM | POA: Insufficient documentation

## 2015-12-04 DIAGNOSIS — Z4789 Encounter for other orthopedic aftercare: Secondary | ICD-10-CM | POA: Diagnosis not present

## 2015-12-04 DIAGNOSIS — I493 Ventricular premature depolarization: Secondary | ICD-10-CM | POA: Insufficient documentation

## 2015-12-04 DIAGNOSIS — R0602 Shortness of breath: Secondary | ICD-10-CM | POA: Diagnosis not present

## 2015-12-04 DIAGNOSIS — M6281 Muscle weakness (generalized): Secondary | ICD-10-CM | POA: Diagnosis not present

## 2015-12-04 LAB — BASIC METABOLIC PANEL
ANION GAP: 11 (ref 5–15)
BUN: 12 mg/dL (ref 6–20)
CALCIUM: 9 mg/dL (ref 8.9–10.3)
CO2: 24 mmol/L (ref 22–32)
Chloride: 101 mmol/L (ref 101–111)
Creatinine, Ser: 0.76 mg/dL (ref 0.44–1.00)
GFR calc Af Amer: >60 mL/min (ref >60)
GFR calc non Af Amer: >60 mL/min (ref >60)
GLUCOSE: 130 mg/dL — AB (ref 65–99)
Potassium: 4 mmol/L (ref 3.5–5.1)
Sodium: 136 mmol/L (ref 135–145)

## 2015-12-04 LAB — URINALYSIS, ROUTINE W REFLEX MICROSCOPIC
BILIRUBIN URINE: NEGATIVE
GLUCOSE, UA: NEGATIVE mg/dL
KETONES UR: 15 mg/dL — AB
Leukocytes, UA: NEGATIVE
NITRITE: NEGATIVE
PH: 5.5 (ref 5.0–8.0)
Protein, ur: NEGATIVE mg/dL
Specific Gravity, Urine: 1.021 (ref 1.005–1.030)

## 2015-12-04 LAB — URINE MICROSCOPIC-ADD ON

## 2015-12-04 LAB — CBC WITH DIFFERENTIAL/PLATELET
BASOS PCT: 0 %
Basophils Absolute: 0 10*3/uL (ref 0.0–0.1)
Eosinophils Absolute: 0 10*3/uL (ref 0.0–0.7)
Eosinophils Relative: 0 %
HEMATOCRIT: 41.5 % (ref 36.0–46.0)
Hemoglobin: 14.1 g/dL (ref 12.0–15.0)
LYMPHS ABS: 0.6 10*3/uL — AB (ref 0.7–4.0)
Lymphocytes Relative: 10 %
MCH: 32 pg (ref 26.0–34.0)
MCHC: 34 g/dL (ref 30.0–36.0)
MCV: 94.1 fL (ref 78.0–100.0)
MONO ABS: 0.9 10*3/uL (ref 0.1–1.0)
MONOS PCT: 15 %
NEUTROS ABS: 4.6 10*3/uL (ref 1.7–7.7)
Neutrophils Relative %: 75 %
Platelets: 218 10*3/uL (ref 150–400)
RBC: 4.41 MIL/uL (ref 3.87–5.11)
RDW: 13.6 % (ref 11.5–15.5)
WBC: 6.1 10*3/uL (ref 4.0–10.5)

## 2015-12-04 MED ORDER — HYDROCODONE-ACETAMINOPHEN 5-325 MG PO TABS
2.0000 | ORAL_TABLET | Freq: Once | ORAL | Status: AC
  Administered 2015-12-04: 2 via ORAL
  Filled 2015-12-04: qty 2

## 2015-12-04 MED ORDER — DOXYCYCLINE HYCLATE 100 MG PO TABS
100.0000 mg | ORAL_TABLET | Freq: Once | ORAL | Status: DC
  Filled 2015-12-04: qty 1

## 2015-12-04 MED ORDER — DOXYCYCLINE HYCLATE 100 MG PO CAPS: 100.0000 mg | ORAL_CAPSULE | Freq: Two times a day (BID) | ORAL | Status: DC

## 2015-12-04 NOTE — Addendum Note (Signed)
Addended by: Barnie [facility] on: 12/04/2015 02:17 PM   Modules accepted: Level of Service

## 2015-12-04 NOTE — ED Provider Notes (Signed)
CSN: [id number]     Arrival date & time 12/04/15  1356 History   First MD Initiated Contact with Patient 12/04/15 1547     Chief Complaint  Patient presents with  . Headache  . Fever     (Consider location/radiation/quality/duration/timing/severity/associated sxs/prior Treatment) HPI ....Marland KitchenMarland KitchenBitemporal headache for 24 hours. Patient is taking Vicodin which helps some. She complains of low-grade fever and cough along with purulent sputum. No stiff neck, neurological deficits, visual changes, peripheral weakness, slurred speech. Severity of symptoms moderate. Nothing makes symptoms better or worse.  Past Medical History  Diagnosis Date  . A-fib ([place])   . Hypertension   . Arthritis   . Cataracts, bilateral   . Other acariasis     peripherial neuropathy  . History of stomach ulcers   . GI bleed 06/2002  . UTI (lower urinary tract infection)     pt state uti w/ no pain  . PVC (premature ventricular contraction)   . Migraines   . Abdominal pain, unspecified site   . Diverticulosis of colon (without mention of hemorrhage)   . Hemorrhage of gastrointestinal tract, unspecified   . Unspecified hereditary and idiopathic peripheral neuropathy   . Osteoarthrosis, unspecified whether generalized or localized, lower leg   . Palpitations   . Depression   . Other and unspecified alcohol dependence, continuous drinking behavior 10/28/2012  . Nausea alone 10/28/2012  . Obesity, unspecified 10/27/2012  . Diverticulosis of colon (without mention of hemorrhage) 10/27/2012  . Insomnia, unspecified 10/27/2012  . Edema 10/27/2012  . Localized osteoarthrosis not specified whether primary or secondary, lower leg 05/31/2011  . Abnormality of gait 06/02/2013   Past Surgical History  Procedure Laterality Date  . Breast reduction surgery  12/23/2003  . Cholecystectomy  2005  . Cataracts    . Arthroscopic repair acl    . Tonsillectomy and adenoidectomy  1941  . Breast biopsy  1973    left   No family history on  file. Social History  Substance Use Topics  . Smoking status: Former Smoker    Types: Cigarettes    Quit date: 10/09/1983  . Smokeless tobacco: Never Used  . Alcohol Use: 0.0 oz/week    1-2 Shots of liquor per week     Comment: daily  scotch    OB History    No data available     Review of Systems  All other systems reviewed and are negative.     Allergies  Mirtazapine; Nickel; and Other  Home Medications   Prior to Admission medications   Medication Sig Start Date End Date Taking? Authorizing Provider  acetaminophen (TYLENOL) 325 MG tablet Take 650 mg by mouth every 4 (four) hours as needed. For pain   Yes Historical Provider, MD  ELIQUIS 5 MG TABS tablet TAKE 1 TABLET BY MOUTH TWICE DAILY Patient taking differently: TAKE 5 MG BY MOUTH TWICE DAILY 01/23/15  Yes Tiffany L Reed, DO  flecainide (TAMBOCOR) 50 MG tablet TAKE 1 TABLET BY MOUTH TWICE DAILY FOR ATRIAL FIBRILLATION Patient taking differently: TAKE50 MG BY MOUTH TWICE DAILY FOR ATRIAL FIBRILLATION 11/08/15  Yes Tiffany L Reed, DO  HYDROcodone-acetaminophen (NORCO/VICODIN) 5-325 MG tablet 1 tab po q 4 hrs prn mild to moderate pain and 2 tabs po q 4 hrs prn severe pain Patient taking differently: Take 1-2 tablets by mouth every 4 (four) hours as needed for moderate pain or severe pain.  11/21/15  Yes Tiffany L Reed, DO  losartan (COZAAR) 100 MG tablet TAKE 1 TABLET  BY MOUTH DAILY Patient taking differently: TAKE 100 MG BY MOUTH DAILY 11/06/15  Yes Tiffany L Reed, DO  methocarbamol (ROBAXIN) 500 MG tablet Take 500 mg by mouth daily as needed for muscle spasms.  11/20/15  Yes Historical Provider, MD  metoprolol succinate (TOPROL XL) 50 MG 24 hr tablet Take 1 tablet (50 mg total) by mouth daily. Take with or immediately following a meal. 06/23/15  Yes Peter M Martinique, MD  Multiple Vitamin (MULTIVITAMIN WITH MINERALS) TABS tablet Take 1 tablet by mouth daily.   Yes Historical Provider, MD  promethazine (PHENERGAN) 25 MG tablet Take  1 tablet (25 mg total) by mouth daily as needed for nausea. For nausea and vomiting 10/04/15  Yes Tiffany L Reed, DO  sodium chloride (OCEAN) 0.65 % SOLN nasal spray Place 1 spray into both nostrils daily as needed for congestion.   Yes Historical Provider, MD  zolpidem (AMBIEN) 10 MG tablet TAKE 1 TABLET BY MOUTH EVERY DAY AT BEDTIME AS NEEDED FOR INSOMNIA Patient taking differently: Take 10 mg by mouth at bedtime as needed for sleep.  10/04/15  Yes Tiffany L Reed, DO  doxycycline (VIBRAMYCIN) 100 MG capsule Take 1 capsule (100 mg total) by mouth 2 (two) times daily. 12/04/15   Nat Christen, MD   BP 128/58 mmHg  Pulse 67  Temp(Src) 98.8 F (37.1 C) (Oral)  Resp 19  SpO2 91% Physical Exam  Constitutional: She is oriented to person, place, and time. She appears well-developed and well-nourished.  No neurological deficits or respiratory distress  HENT:  Head: Normocephalic and atraumatic.  Eyes: Conjunctivae and EOM are normal. Pupils are equal, round, and reactive to light.  Neck: Normal range of motion. Neck supple.  Cardiovascular: Normal rate and regular rhythm.   Pulmonary/Chest: Effort normal and breath sounds normal.  Abdominal: Soft. Bowel sounds are normal.  Musculoskeletal: Normal range of motion.  Neurological: She is alert and oriented to person, place, and time.  Skin: Skin is warm and dry.  Psychiatric: She has a normal mood and affect. Her behavior is normal.  Nursing note and vitals reviewed.   ED Course  Procedures (including critical care time) Labs Review Labs Reviewed  BASIC METABOLIC PANEL - Abnormal; Notable for the following:    Glucose, Bld 130 (*)    All other components within normal limits  CBC WITH DIFFERENTIAL/PLATELET - Abnormal; Notable for the following:    Lymphs Abs 0.6 (*)    All other components within normal limits  URINALYSIS, ROUTINE W REFLEX MICROSCOPIC (NOT AT [facility]) - Abnormal; Notable for the following:    Hgb urine dipstick TRACE (*)     Ketones, ur 15 (*)    All other components within normal limits  URINE MICROSCOPIC-ADD ON - Abnormal; Notable for the following:    Squamous Epithelial / LPF 0-5 (*)    Bacteria, UA RARE (*)    All other components within normal limits    Imaging Review Dg Chest 2 View  12/04/2015  CLINICAL DATA:  Shortness of breath and cough for 1 day. EXAM: CHEST  2 VIEW COMPARISON:  June 06, 2013 FINDINGS: There is stable mild elevation of the left hemidiaphragm. There is mild scarring in the left base region. There is no edema or consolidation. Heart is upper normal in size with pulmonary vascularity within normal limits. There is atherosclerotic calcification in the aorta. There is degenerative change in the thoracic spine. No adenopathy apparent. There are surgical clips in the upper abdomen. IMPRESSION: Mild scarring  left base. No edema or consolidation. Extensive atherosclerotic calcification in the aorta. Electronically Signed   By: Lowella Grip III M.D.   On: 12/04/2015 16:44   Ct Head Wo Contrast  12/04/2015  CLINICAL DATA:  Headache and fever. EXAM: CT HEAD WITHOUT CONTRAST TECHNIQUE: Contiguous axial images were obtained from the base of the skull through the vertex without intravenous contrast. COMPARISON:  12/02/2014 FINDINGS: Ventricles and cisterns are within normal. Remaining CSF spaces are unremarkable. Subtle chronic ischemic microvascular disease. No focal mass, mass effect, shift of midline structures or acute hemorrhage. No evidence of acute infarction. Remaining bones and soft tissues are unchanged. IMPRESSION: No acute intracranial findings. Minimal chronic ischemic microvascular disease. Electronically Signed   By: Marin Olp M.D.   On: 12/04/2015 19:02   I have personally reviewed and evaluated these images and lab results as part of my medical decision-making.   EKG Interpretation None      MDM   Final diagnoses:  Acute intractable headache, unspecified headache type     Patient is in no acute distress. No neuro deficits or meningeal signs. White count normal. Chest x-ray shows no pneumonia and CT head reveals no acute findings. Will start doxycycline for her bronchitic symptoms.    Nat Christen, MD 12/04/15 2136

## 2015-12-04 NOTE — ED Notes (Signed)
Patient transported to CT 

## 2015-12-04 NOTE — ED Notes (Addendum)
Per pt's request, MD made aware that pt is having "pussy" sputum from her cough

## 2015-12-04 NOTE — Discharge Instructions (Signed)
Brain scan was read as normal. Continue to take your pain medicine. Prescription for antibiotic for your chest cold

## 2015-12-04 NOTE — Progress Notes (Signed)
Patient ID: [name], female   DOB: 1932-10-30, 82 y.o.   MRN: [id number]  Provider:  Royal Hawthorn NP Location:  [place] of Service:  SNF (31)  PCP: Hollace Kinnier, DO Patient Care Team: Gayland Curry, DO as PCP - General (Geriatric Medicine) Peter M Martinique, MD as Attending Physician (Cardiology) Well Newberg, MD as Consulting Physician (Neurology)  Extended Emergency Contact Information Primary Emergency Contact: Darden Palmer [place] of [place] Phone: [phone] Mobile Phone: [phone] Relation: Relative Secondary Emergency Contact: [place] of [place] Work Phone: [phone] Mobile Phone: [phone] Relation: None  Code Status: DNR Goals of Care: Advanced Directive information Advanced Directives 11/21/2015  Does patient have an advance directive? Yes  Type of Advance Directive Living will;Healthcare Power of [place];Out of facility DNR (pink MOST or yellow form)  Does patient want to make changes to advanced directive? No - Patient declined  Copy of advanced directive(s) in chart? Yes  Pre-existing out of facility DNR order (yellow form or pink MOST form) Yellow form placed in chart (order not valid for inpatient use)   Chief Complaint  Patient presents with  . Discharge Note    HPI: Patient is a 82 y.o. female seen today for admission to [facility] s/p ED visit from 11/18/15 s/p fall with right fibular fracture.  She had fallen on 11/17/15 on her right knee when intoxicated.  She underwent xrays which showed a nondisplaced right fibular neck fracture, advanced degenerative changes of the knee and a small to moderate joint effusion.  She normally ambulates with a walker and lives in [place].  Dr. Sharol Given recommended that she use a knee immobilizer on her right knee She has worked with therapy and they feel she is ready for discharge. Her VS have been stable. She is eating  well and having normal BMs.  She has urinary frequency and reports this is a chronic problem but has no dysuria or fever. She is using vicodin for pain and reports that this helps.   Past Medical History  Diagnosis Date  . A-fib ([place])   . Hypertension   . Arthritis   . Cataracts, bilateral   . Other acariasis     peripherial neuropathy  . History of stomach ulcers   . GI bleed 06/2002  . UTI (lower urinary tract infection)     pt state uti w/ no pain  . PVC (premature ventricular contraction)   . Migraines   . Abdominal pain, unspecified site   . Diverticulosis of colon (without mention of hemorrhage)   . Hemorrhage of gastrointestinal tract, unspecified   . Unspecified hereditary and idiopathic peripheral neuropathy   . Osteoarthrosis, unspecified whether generalized or localized, lower leg   . Palpitations   . Depression   . Other and unspecified alcohol dependence, continuous drinking behavior 10/28/2012  . Nausea alone 10/28/2012  . Obesity, unspecified 10/27/2012  . Diverticulosis of colon (without mention of hemorrhage) 10/27/2012  . Insomnia, unspecified 10/27/2012  . Edema 10/27/2012  . Localized osteoarthrosis not specified whether primary or secondary, lower leg 05/31/2011  . Abnormality of gait 06/02/2013   Past Surgical History  Procedure Laterality Date  . Breast reduction surgery  12/23/2003  . Cholecystectomy  2005  . Cataracts    . Arthroscopic repair acl    . Tonsillectomy and adenoidectomy  1941  . Breast biopsy  1973    left    reports that she quit smoking  about 32 years ago. Her smoking use included Cigarettes. She has never used smokeless tobacco. She reports that she drinks alcohol. She reports that she does not use illicit drugs. Social History   Social History  . Marital Status: Widowed    Spouse Name: N/A  . Number of Children: N/A  . Years of Education: N/A   Occupational History  . retired Marine scientist    Social History Main Topics  . Smoking status: Former  Smoker    Types: Cigarettes    Quit date: 10/09/1983  . Smokeless tobacco: Never Used  . Alcohol Use: 0.0 oz/week    1-2 Shots of liquor per week     Comment: daily  scotch   . Drug Use: No  . Sexual Activity: No   Other Topics Concern  . Not on file   Social History Narrative   Lives at [facility], in [place]   Widow -2004   Stopped smoking 1985   POA, DNR, Living Will   [place] with walker   Alcohol scotch daily    Exercise none    Functional Status Survey:    History reviewed. No pertinent family history.  Health Maintenance  Topic Date Due  . DEXA SCAN  05/08/2000  . INFLUENZA VACCINE  04/23/2016  . TETANUS/TDAP  09/23/2020  . ZOSTAVAX  Completed  . PNA vac Low Risk Adult  Completed    Allergies  Allergen Reactions  . Mirtazapine     tremor  . Nickel Other (See Comments)    inflammation  . Other     Prednisone causes sleep disturbances      Medication List       This list is accurate as of: 11/30/15 11:59 PM.  Always use your most recent med list.               acetaminophen 325 MG tablet  Commonly known as:  TYLENOL  Take 650 mg by mouth every 4 (four) hours as needed. For pain     ELIQUIS 5 MG Tabs tablet  Generic drug:  apixaban  TAKE 1 TABLET BY MOUTH TWICE DAILY     flecainide 50 MG tablet  Commonly known as:  TAMBOCOR  TAKE 1 TABLET BY MOUTH TWICE DAILY FOR ATRIAL FIBRILLATION     HYDROcodone-acetaminophen 5-325 MG tablet  Commonly known as:  NORCO/VICODIN  1 tab po q 4 hrs prn mild to moderate pain and 2 tabs po q 4 hrs prn severe pain     losartan 100 MG tablet  Commonly known as:  COZAAR  TAKE 1 TABLET BY MOUTH DAILY     metoprolol succinate 50 MG 24 hr tablet  Commonly known as:  TOPROL XL  Take 1 tablet (50 mg total) by mouth daily. Take with or immediately following a meal.     multivitamin with minerals Tabs tablet  Take 1 tablet by mouth daily.     promethazine 25 MG tablet  Commonly known as:  PHENERGAN  Take 1 tablet  (25 mg total) by mouth daily as needed for nausea. For nausea and vomiting     sodium chloride 0.65 % Soln nasal spray  Commonly known as:  OCEAN  Place 1 spray into both nostrils daily as needed for congestion.     zolpidem 10 MG tablet  Commonly known as:  AMBIEN  TAKE 1 TABLET BY MOUTH EVERY DAY AT BEDTIME AS NEEDED FOR INSOMNIA        Review of Systems  Constitutional: Negative for  fever, chills and malaise/fatigue.  HENT: Negative for congestion and hearing loss.   Eyes:       Glasses  Respiratory: Negative for shortness of breath.   Cardiovascular: Positive for leg swelling. Negative for chest pain and palpitations.       Right leg and foot  Gastrointestinal: Negative for abdominal pain, diarrhea, constipation, blood in stool and melena.  Genitourinary: Positive for urgency and frequency. Negative for dysuria, hematuria and flank pain.  Musculoskeletal: Positive for joint pain and falls.       Right leg pain  Skin: Negative for rash.  Neurological: Negative for dizziness and weakness.  Endo/Heme/Allergies: Bruises/bleeds easily.  Psychiatric/Behavioral: Negative for memory loss. The patient is nervous/anxious and has insomnia.     Filed Vitals:   11/30/15 1355  BP: 149/74  Pulse: 61  Resp: 20  Weight: 190 lb 3.2 oz (86.274 kg)   Body mass index is 29.78 kg/(m^2). Physical Exam  Constitutional: She is oriented to person, place, and time. She appears well-developed and well-nourished. No distress.  HENT:  Head: Normocephalic and atraumatic.  Eyes: Conjunctivae and EOM are normal. Pupils are equal, round, and reactive to light.  Neck: Normal range of motion. Neck supple. No JVD present.  Cardiovascular: Normal rate, regular rhythm, normal heart sounds and intact distal pulses.   Pulmonary/Chest: Effort normal and breath sounds normal. No respiratory distress.  Abdominal: Soft. Bowel sounds are normal. She exhibits no distension and no mass. There is no tenderness.    Musculoskeletal: She exhibits edema. She exhibits no tenderness.  Right proximal lower lateral leg; right knee effusion, no warmth, no tenderness, no erythema  Lymphadenopathy:    She has no cervical adenopathy.  Neurological: She is alert and oriented to person, place, and time. No cranial nerve deficit.  Skin: Skin is warm and dry. She is not diaphoretic.  Abrasion of right first metatarsal region dorsal surface; ecchymoses of right shoulder  Psychiatric: She has a normal mood and affect. Her behavior is normal. Judgment and thought content normal.    Labs reviewed: Basic Metabolic Panel:  Recent Labs  10/03/15  NA 141  K 4.7  BUN 14  CREATININE 0.9   Liver Function Tests:  Recent Labs  10/03/15  AST 16  ALT 10  ALKPHOS 64   No results for input(s): LIPASE, AMYLASE in the last 8760 hours. No results for input(s): AMMONIA in the last 8760 hours. CBC:  Recent Labs  10/03/15  WBC 7.5  HGB 14.9  HCT 42  PLT 231   Cardiac Enzymes: No results for input(s): CKTOTAL, CKMB, CKMBINDEX, TROPONINI in the last 8760 hours. BNP: Invalid input(s): POCBNP Lab Results  Component Value Date   HGBA1C 5.3 10/03/2015   Lab Results  Component Value Date   TSH 1.215 06/05/2013   Lab Results  Component Value Date   [id number] 06/05/2013   No results found for: FOLATE No results found for: IRON, TIBC, FERRITIN  Imaging and Procedures obtained prior to SNF admission: Dg Knee Complete 4 Views Right  11/18/2015  CLINICAL DATA:  Fall at home last night. Injury to right knee. Pain and swelling. EXAM: RIGHT KNEE - COMPLETE 4+ VIEW COMPARISON:  None FINDINGS: Advanced degenerative changes in the right knee, most pronounced in the medial and patellofemoral compartments. There is an oblique fracture through the right fibular neck. Small to moderate joint effusion. No subluxation or dislocation. IMPRESSION: Nondisplaced right fibular neck fracture. Advanced degenerative changes.   Small to moderate joint effusion.  Electronically Signed   By: Rolm Baptise M.D.   On: 11/18/2015 13:38    Assessment/Plan 1. Closed fibular fracture, right, initial encounter -followed by ortho, f/u apt in place -continue prn vicodin -may bend knee with pain as a guide and continue immobilizer but may remove for hygiene per ortho -continue therapy  2. Alcohol dependence with uncomplicated intoxication ([place]) -no issues her in rehab  3. Primary osteoarthritis of both knees -has baseline bilateral knee pain -cont tylenol prn, but not to exceed 3 total grams of apap per day along with her vicodin  4. Chronic atrial fibrillation (HCC) -continues on flecainide, eliquis, toprol xl, rate controlled  5. Overactive bladder -not on meds due to side effects -does have frequency   6. Insomnia -uses ambien   7. Essential hypertension -bp at goal with losartan, toprol, cont same  Resident may discharge home and should continue PT and OT.  VS are stable.  Has apt with Dr. Mariea Clonts and Dr. Sharol Given for follow up.   Family/ staff Communication: discussed with rehab nursing and therapy  Cindi Carbon, [place] [phone]

## 2015-12-04 NOTE — ED Notes (Signed)
Patient presents via EMS for headache and fever. Patient with recent right fibula fracture, taking Vicodin four 5-325 today. Staff reports 101.4 temp.

## 2015-12-04 NOTE — ED Notes (Signed)
Patient was able to tolerate OJ without any problems.

## 2015-12-05 ENCOUNTER — Telehealth: Payer: Self-pay | Admitting: *Deleted

## 2015-12-05 DIAGNOSIS — Z4789 Encounter for other orthopedic aftercare: Secondary | ICD-10-CM | POA: Diagnosis not present

## 2015-12-05 DIAGNOSIS — M84463A Pathological fracture, right fibula, initial encounter for fracture: Secondary | ICD-10-CM | POA: Diagnosis not present

## 2015-12-05 DIAGNOSIS — M6281 Muscle weakness (generalized): Secondary | ICD-10-CM | POA: Diagnosis not present

## 2015-12-05 DIAGNOSIS — M25561 Pain in right knee: Secondary | ICD-10-CM | POA: Diagnosis not present

## 2015-12-05 DIAGNOSIS — R2689 Other abnormalities of gait and mobility: Secondary | ICD-10-CM | POA: Diagnosis not present

## 2015-12-05 DIAGNOSIS — R278 Other lack of coordination: Secondary | ICD-10-CM | POA: Diagnosis not present

## 2015-12-05 NOTE — Telephone Encounter (Signed)
[name] with [facility] called and stated that patient was in the ER and the doctor was suppose to prescribe Doxycyline 100mg  twice daily but patient never received Rx. They are wanting to know if we can call it in for them. Reviewed patients chart and it shows the the provider printed and given to Rx to the patient. I called the patient and she stated that she did have the Rx but she wanted it called in. Informed patient that she would have to take that Rx to the pharmacy to have it filled since we didn't prescribe. She stated that she would have caregiver take to pharmacy.

## 2015-12-06 DIAGNOSIS — M84463A Pathological fracture, right fibula, initial encounter for fracture: Secondary | ICD-10-CM | POA: Diagnosis not present

## 2015-12-06 DIAGNOSIS — Z4789 Encounter for other orthopedic aftercare: Secondary | ICD-10-CM | POA: Diagnosis not present

## 2015-12-06 DIAGNOSIS — M6281 Muscle weakness (generalized): Secondary | ICD-10-CM | POA: Diagnosis not present

## 2015-12-06 DIAGNOSIS — R2689 Other abnormalities of gait and mobility: Secondary | ICD-10-CM | POA: Diagnosis not present

## 2015-12-06 DIAGNOSIS — M25561 Pain in right knee: Secondary | ICD-10-CM | POA: Diagnosis not present

## 2015-12-06 DIAGNOSIS — R278 Other lack of coordination: Secondary | ICD-10-CM | POA: Diagnosis not present

## 2015-12-07 DIAGNOSIS — Z4789 Encounter for other orthopedic aftercare: Secondary | ICD-10-CM | POA: Diagnosis not present

## 2015-12-07 DIAGNOSIS — R278 Other lack of coordination: Secondary | ICD-10-CM | POA: Diagnosis not present

## 2015-12-07 DIAGNOSIS — M6281 Muscle weakness (generalized): Secondary | ICD-10-CM | POA: Diagnosis not present

## 2015-12-07 DIAGNOSIS — M84463A Pathological fracture, right fibula, initial encounter for fracture: Secondary | ICD-10-CM | POA: Diagnosis not present

## 2015-12-07 DIAGNOSIS — M25561 Pain in right knee: Secondary | ICD-10-CM | POA: Diagnosis not present

## 2015-12-07 DIAGNOSIS — R2689 Other abnormalities of gait and mobility: Secondary | ICD-10-CM | POA: Diagnosis not present

## 2015-12-08 DIAGNOSIS — M84463A Pathological fracture, right fibula, initial encounter for fracture: Secondary | ICD-10-CM | POA: Diagnosis not present

## 2015-12-08 DIAGNOSIS — R2689 Other abnormalities of gait and mobility: Secondary | ICD-10-CM | POA: Diagnosis not present

## 2015-12-08 DIAGNOSIS — M25561 Pain in right knee: Secondary | ICD-10-CM | POA: Diagnosis not present

## 2015-12-08 DIAGNOSIS — R278 Other lack of coordination: Secondary | ICD-10-CM | POA: Diagnosis not present

## 2015-12-08 DIAGNOSIS — M6281 Muscle weakness (generalized): Secondary | ICD-10-CM | POA: Diagnosis not present

## 2015-12-08 DIAGNOSIS — Z4789 Encounter for other orthopedic aftercare: Secondary | ICD-10-CM | POA: Diagnosis not present

## 2015-12-11 DIAGNOSIS — M25561 Pain in right knee: Secondary | ICD-10-CM | POA: Diagnosis not present

## 2015-12-11 DIAGNOSIS — R278 Other lack of coordination: Secondary | ICD-10-CM | POA: Diagnosis not present

## 2015-12-11 DIAGNOSIS — R2689 Other abnormalities of gait and mobility: Secondary | ICD-10-CM | POA: Diagnosis not present

## 2015-12-11 DIAGNOSIS — M6281 Muscle weakness (generalized): Secondary | ICD-10-CM | POA: Diagnosis not present

## 2015-12-11 DIAGNOSIS — Z4789 Encounter for other orthopedic aftercare: Secondary | ICD-10-CM | POA: Diagnosis not present

## 2015-12-11 DIAGNOSIS — M84463A Pathological fracture, right fibula, initial encounter for fracture: Secondary | ICD-10-CM | POA: Diagnosis not present

## 2015-12-12 DIAGNOSIS — R2689 Other abnormalities of gait and mobility: Secondary | ICD-10-CM | POA: Diagnosis not present

## 2015-12-12 DIAGNOSIS — M84463A Pathological fracture, right fibula, initial encounter for fracture: Secondary | ICD-10-CM | POA: Diagnosis not present

## 2015-12-12 DIAGNOSIS — Z4789 Encounter for other orthopedic aftercare: Secondary | ICD-10-CM | POA: Diagnosis not present

## 2015-12-12 DIAGNOSIS — M25561 Pain in right knee: Secondary | ICD-10-CM | POA: Diagnosis not present

## 2015-12-12 DIAGNOSIS — M6281 Muscle weakness (generalized): Secondary | ICD-10-CM | POA: Diagnosis not present

## 2015-12-12 DIAGNOSIS — R278 Other lack of coordination: Secondary | ICD-10-CM | POA: Diagnosis not present

## 2015-12-13 ENCOUNTER — Encounter: Payer: Self-pay | Admitting: Internal Medicine

## 2015-12-13 ENCOUNTER — Non-Acute Institutional Stay: Payer: Medicare Other | Admitting: Internal Medicine

## 2015-12-13 VITALS — BP 130/70 | HR 63 | Temp 97.7°F | Ht 67.0 in | Wt 188.0 lb

## 2015-12-13 DIAGNOSIS — Z4789 Encounter for other orthopedic aftercare: Secondary | ICD-10-CM | POA: Diagnosis not present

## 2015-12-13 DIAGNOSIS — R1310 Dysphagia, unspecified: Secondary | ICD-10-CM | POA: Diagnosis not present

## 2015-12-13 DIAGNOSIS — M25561 Pain in right knee: Secondary | ICD-10-CM | POA: Diagnosis not present

## 2015-12-13 DIAGNOSIS — M6281 Muscle weakness (generalized): Secondary | ICD-10-CM | POA: Diagnosis not present

## 2015-12-13 DIAGNOSIS — R278 Other lack of coordination: Secondary | ICD-10-CM | POA: Diagnosis not present

## 2015-12-13 DIAGNOSIS — J209 Acute bronchitis, unspecified: Secondary | ICD-10-CM | POA: Diagnosis not present

## 2015-12-13 DIAGNOSIS — S82401S Unspecified fracture of shaft of right fibula, sequela: Secondary | ICD-10-CM | POA: Diagnosis not present

## 2015-12-13 DIAGNOSIS — R2689 Other abnormalities of gait and mobility: Secondary | ICD-10-CM | POA: Diagnosis not present

## 2015-12-13 DIAGNOSIS — M84463A Pathological fracture, right fibula, initial encounter for fracture: Secondary | ICD-10-CM | POA: Diagnosis not present

## 2015-12-13 MED ORDER — ALBUTEROL SULFATE (2.5 MG/3ML) 0.083% IN NEBU: 2.5000 mg | INHALATION_SOLUTION | Freq: Four times a day (QID) | RESPIRATORY_TRACT | Status: DC | PRN

## 2015-12-13 MED ORDER — GUAIFENESIN ER 600 MG PO TB12: 600.0000 mg | ORAL_TABLET | Freq: Two times a day (BID) | ORAL | Status: DC

## 2015-12-13 NOTE — Patient Instructions (Signed)
Use the nebulizer machine every 6 hrs until albuterol vials are used up or you are better.  Then return the machine to the clinic.  I sent mucinex to [facility] for you.    Drink more fluids.  Glad your leg is healing.    Let me know if your swallowing problem is persisting

## 2015-12-13 NOTE — Progress Notes (Signed)
Patient ID: [name], female   DOB: 1933-09-19, 82 y.o.   MRN: [id number]   Location: [facility] (12)  Provider: Kjersti Dittmer L. Mariea Clonts, D.O., C.M.D.  Code Status: DNR Goals of Care:  Advanced Directives 12/13/2015  Does patient have an advance directive? Yes  Type of Paramedic of [place];Out of facility DNR (pink MOST or yellow form)  Copy of advanced directive(s) in chart? Yes  Pre-existing out of facility DNR order (yellow form or pink MOST form) Yellow form placed in chart (order not valid for inpatient use)     Chief Complaint  Patient presents with  . Acute Visit    cough, elevated BP    HPI: Patient is a 82 y.o. female seen today for an acute visit for cough and elevated blood pressure.  She had been to the ED on 3/13 with intractable headache.  She had low grade temp and cough with purulent sputum.  Headache moderate.  Urine studies were done, cbc, bmp, CXR and CT head w/o contrast.  Doxycycline was  to be ordered for bronchitis, but unclear what transpired as pt called w/o it the following day requesting it--caregiver was going to take her to get the medication.  Hydrocodone was prescribed in the ED x 2 tabs.  Fluids encouraged.  She cannot get rid of the wheezing and cough.  Has one more day of the doxycycline.  Not taking any cough meds.  Is wheezing so there must still be some in there.  Cough had been green, no longer is.  Some mild white sputum.    BP here today is normal.  BP was 160s yesterday am so that's the other reason this appt was scheduled.    Right leg is healing.  New bone is forming there.  Had f/u Tuesday.  Goes back in 1 month.  Is out of her boot in less than 2 wks.    Feels like eating in bed has not been helpful with her swallowing.  Pills and food are sticking.  Is drinking fewer fluids b/c of immobility and urgency.    Past Medical History  Diagnosis Date  . A-fib ([place])   .  Hypertension   . Arthritis   . Cataracts, bilateral   . Other acariasis     peripherial neuropathy  . History of stomach ulcers   . GI bleed 06/2002  . UTI (lower urinary tract infection)     pt state uti w/ no pain  . PVC (premature ventricular contraction)   . Migraines   . Abdominal pain, unspecified site   . Diverticulosis of colon (without mention of hemorrhage)   . Hemorrhage of gastrointestinal tract, unspecified   . Unspecified hereditary and idiopathic peripheral neuropathy   . Osteoarthrosis, unspecified whether generalized or localized, lower leg   . Palpitations   . Depression   . Other and unspecified alcohol dependence, continuous drinking behavior 10/28/2012  . Nausea alone 10/28/2012  . Obesity, unspecified 10/27/2012  . Diverticulosis of colon (without mention of hemorrhage) 10/27/2012  . Insomnia, unspecified 10/27/2012  . Edema 10/27/2012  . Localized osteoarthrosis not specified whether primary or secondary, lower leg 05/31/2011  . Abnormality of gait 06/02/2013    Past Surgical History  Procedure Laterality Date  . Breast reduction surgery  12/23/2003  . Cholecystectomy  2005  . Cataracts    . Arthroscopic repair acl    . Tonsillectomy and adenoidectomy  1941  .  Breast biopsy  1973    left    Allergies  Allergen Reactions  . Mirtazapine     tremor  . Nickel Other (See Comments)    inflammation  . Other     Prednisone causes sleep disturbances      Medication List       This list is accurate as of: 12/13/15  4:12 PM.  Always use your most recent med list.               acetaminophen 325 MG tablet  Commonly known as:  TYLENOL  Take 650 mg by mouth every 4 (four) hours as needed. For pain     doxycycline 100 MG capsule  Commonly known as:  VIBRAMYCIN  Take 1 capsule (100 mg total) by mouth 2 (two) times daily.     ELIQUIS 5 MG Tabs tablet  Generic drug:  apixaban  Take 5 mg by mouth 2 (two) times daily.     flecainide 50 MG tablet  Commonly  known as:  TAMBOCOR  Take 50 mg by mouth 2 (two) times daily.     HYDROcodone-acetaminophen 5-325 MG tablet  Commonly known as:  NORCO/VICODIN  Take 1-2 tablets by mouth every 4 (four) hours as needed for moderate pain.     losartan 100 MG tablet  Commonly known as:  COZAAR  Take 100 mg by mouth daily.     methocarbamol 500 MG tablet  Commonly known as:  ROBAXIN  Take 500 mg by mouth daily as needed for muscle spasms.     metoprolol succinate 50 MG 24 hr tablet  Commonly known as:  TOPROL XL  Take 1 tablet (50 mg total) by mouth daily. Take with or immediately following a meal.     multivitamin with minerals Tabs tablet  Take 1 tablet by mouth daily.     promethazine 25 MG tablet  Commonly known as:  PHENERGAN  Take 1 tablet (25 mg total) by mouth daily as needed for nausea. For nausea and vomiting     sodium chloride 0.65 % Soln nasal spray  Commonly known as:  OCEAN  Place 1 spray into both nostrils daily as needed for congestion.     zolpidem 10 MG tablet  Commonly known as:  AMBIEN  Take 10 mg by mouth at bedtime as needed for sleep.        Review of Systems:  Review of Systems  Constitutional: Positive for fever, chills and appetite change.  HENT: Positive for congestion, sinus pressure and sore throat.   Eyes: Negative for pain.  Respiratory: Positive for cough, shortness of breath and wheezing.   Cardiovascular: Negative for chest pain.  Gastrointestinal: Negative for nausea, vomiting, abdominal pain and diarrhea.  Genitourinary: Negative for dysuria.  Musculoskeletal: Positive for arthralgias.  Skin: Positive for pallor.  Neurological: Positive for headaches.  Psychiatric/Behavioral: Negative for confusion.    Health Maintenance  Topic Date Due  . DEXA SCAN  05/08/2000  . INFLUENZA VACCINE  04/23/2016  . TETANUS/TDAP  09/23/2020  . ZOSTAVAX  Completed  . PNA vac Low Risk Adult  Completed    Physical Exam: Filed Vitals:   12/13/15 1558  BP:  130/70  Pulse: 63  Temp: 97.7 F (36.5 C)  TempSrc: Oral  Height: 5\' 7"  (1.702 m)  Weight: 188 lb (85.276 kg)  SpO2: 96%   Body mass index is 29.44 kg/(m^2). Physical Exam  Constitutional: She is oriented to person, place, and time. She appears well-developed and  well-nourished. No distress.  Cardiovascular: Normal rate, regular rhythm, normal heart sounds and intact distal pulses.   Pulmonary/Chest: Effort normal. No respiratory distress. She has wheezes. She has no rales. She exhibits no tenderness.  Abdominal: Soft. Bowel sounds are normal.  Musculoskeletal: Normal range of motion.  Brace still on right leg  Neurological: She is alert and oriented to person, place, and time.  Skin: Skin is warm and dry. There is pallor.    Labs reviewed: Basic Metabolic Panel:  Recent Labs  10/03/15 12/04/15 1619  NA 141 136  K 4.7 4.0  CL  --  101  CO2  --  24  GLUCOSE  --  130*  BUN 14 12  CREATININE 0.9 0.76  CALCIUM  --  9.0   Liver Function Tests:  Recent Labs  10/03/15  AST 16  ALT 10  ALKPHOS 64   No results for input(s): LIPASE, AMYLASE in the last 8760 hours. No results for input(s): AMMONIA in the last 8760 hours. CBC:  Recent Labs  10/03/15 12/04/15 1619  WBC 7.5 6.1  NEUTROABS  --  4.6  HGB 14.9 14.1  HCT 42 41.5  MCV  --  94.1  PLT 231 218   Lipid Panel:  Recent Labs  10/03/15  CHOL 171  HDL 63  LDLCALC 81  TRIG 136   Lab Results  Component Value Date   HGBA1C 5.3 10/03/2015    Procedures since last visit: Dg Chest 2 View  12/04/2015  CLINICAL DATA:  Shortness of breath and cough for 1 day. EXAM: CHEST  2 VIEW COMPARISON:  June 06, 2013 FINDINGS: There is stable mild elevation of the left hemidiaphragm. There is mild scarring in the left base region. There is no edema or consolidation. Heart is upper normal in size with pulmonary vascularity within normal limits. There is atherosclerotic calcification in the aorta. There is degenerative  change in the thoracic spine. No adenopathy apparent. There are surgical clips in the upper abdomen. IMPRESSION: Mild scarring left base. No edema or consolidation. Extensive atherosclerotic calcification in the aorta. Electronically Signed   By: Lowella Grip III M.D.   On: 12/04/2015 16:44   Ct Head Wo Contrast  12/04/2015  CLINICAL DATA:  Headache and fever. EXAM: CT HEAD WITHOUT CONTRAST TECHNIQUE: Contiguous axial images were obtained from the base of the skull through the vertex without intravenous contrast. COMPARISON:  12/02/2014 FINDINGS: Ventricles and cisterns are within normal. Remaining CSF spaces are unremarkable. Subtle chronic ischemic microvascular disease. No focal mass, mass effect, shift of midline structures or acute hemorrhage. No evidence of acute infarction. Remaining bones and soft tissues are unchanged. IMPRESSION: No acute intracranial findings. Minimal chronic ischemic microvascular disease. Electronically Signed   By: Marin Olp M.D.   On: 12/04/2015 19:02   Dg Knee Complete 4 Views Right  11/18/2015  CLINICAL DATA:  Fall at home last night. Injury to right knee. Pain and swelling. EXAM: RIGHT KNEE - COMPLETE 4+ VIEW COMPARISON:  None FINDINGS: Advanced degenerative changes in the right knee, most pronounced in the medial and patellofemoral compartments. There is an oblique fracture through the right fibular neck. Small to moderate joint effusion. No subluxation or dislocation. IMPRESSION: Nondisplaced right fibular neck fracture. Advanced degenerative changes.  Small to moderate joint effusion. Electronically Signed   By: Rolm Baptise M.D.   On: 11/18/2015 13:38    Assessment/Plan 1. Closed fibular fracture, right, sequela -is healing gradually, still wearing brace, frustrated relying on others for help,  not much pain anymore  2. Acute bronchitis, unspecified organism - worst problem at present--difficulty getting up sputum -complete doxycycline course - DME  Nebulizer machine - guaiFENesin (MUCINEX) 600 MG 12 hr tablet; Take 1 tablet (600 mg total) by mouth 2 (two) times daily.  Dispense: 30 tablet; Refill: 0 was [facility] to [facility] -encouraged to drink more water - albuterol (PROVENTIL) (2.5 MG/3ML) 0.083% nebulizer solution; Take 3 mLs (2.5 mg total) by nebulization every 6 (six) hours as needed for wheezing or shortness of breath.  Dispense: 150 mL; Refill: 0 . 3. Dysphagia:  New problem -she will let me know if she wants to do something about it--refuses for now   Labs/tests ordered:  See above Next appt:  04/10/2016  Hellena Pridgen L. Ngoc Daughtridge, D.O. [facility] [address], [place] [address] Cell Phone (Mon-Fri 8am-5pm):  [phone] On Call:  [phone] & follow prompts after 5pm & weekends Office Phone:  [phone] Office Fax:  [phone]

## 2015-12-14 DIAGNOSIS — R2689 Other abnormalities of gait and mobility: Secondary | ICD-10-CM | POA: Diagnosis not present

## 2015-12-14 DIAGNOSIS — M25561 Pain in right knee: Secondary | ICD-10-CM | POA: Diagnosis not present

## 2015-12-14 DIAGNOSIS — R278 Other lack of coordination: Secondary | ICD-10-CM | POA: Diagnosis not present

## 2015-12-14 DIAGNOSIS — M84463A Pathological fracture, right fibula, initial encounter for fracture: Secondary | ICD-10-CM | POA: Diagnosis not present

## 2015-12-14 DIAGNOSIS — M6281 Muscle weakness (generalized): Secondary | ICD-10-CM | POA: Diagnosis not present

## 2015-12-14 DIAGNOSIS — Z4789 Encounter for other orthopedic aftercare: Secondary | ICD-10-CM | POA: Diagnosis not present

## 2015-12-18 DIAGNOSIS — M25561 Pain in right knee: Secondary | ICD-10-CM | POA: Diagnosis not present

## 2015-12-18 DIAGNOSIS — R278 Other lack of coordination: Secondary | ICD-10-CM | POA: Diagnosis not present

## 2015-12-18 DIAGNOSIS — M84463A Pathological fracture, right fibula, initial encounter for fracture: Secondary | ICD-10-CM | POA: Diagnosis not present

## 2015-12-18 DIAGNOSIS — Z4789 Encounter for other orthopedic aftercare: Secondary | ICD-10-CM | POA: Diagnosis not present

## 2015-12-18 DIAGNOSIS — M6281 Muscle weakness (generalized): Secondary | ICD-10-CM | POA: Diagnosis not present

## 2015-12-18 DIAGNOSIS — R2689 Other abnormalities of gait and mobility: Secondary | ICD-10-CM | POA: Diagnosis not present

## 2015-12-19 DIAGNOSIS — M6281 Muscle weakness (generalized): Secondary | ICD-10-CM | POA: Diagnosis not present

## 2015-12-19 DIAGNOSIS — R278 Other lack of coordination: Secondary | ICD-10-CM | POA: Diagnosis not present

## 2015-12-19 DIAGNOSIS — M84463A Pathological fracture, right fibula, initial encounter for fracture: Secondary | ICD-10-CM | POA: Diagnosis not present

## 2015-12-19 DIAGNOSIS — Z4789 Encounter for other orthopedic aftercare: Secondary | ICD-10-CM | POA: Diagnosis not present

## 2015-12-19 DIAGNOSIS — M25561 Pain in right knee: Secondary | ICD-10-CM | POA: Diagnosis not present

## 2015-12-19 DIAGNOSIS — R2689 Other abnormalities of gait and mobility: Secondary | ICD-10-CM | POA: Diagnosis not present

## 2015-12-26 DIAGNOSIS — Z4789 Encounter for other orthopedic aftercare: Secondary | ICD-10-CM | POA: Diagnosis not present

## 2015-12-26 DIAGNOSIS — M6281 Muscle weakness (generalized): Secondary | ICD-10-CM | POA: Diagnosis not present

## 2015-12-26 DIAGNOSIS — M84463A Pathological fracture, right fibula, initial encounter for fracture: Secondary | ICD-10-CM | POA: Diagnosis not present

## 2015-12-26 DIAGNOSIS — Z9181 History of falling: Secondary | ICD-10-CM | POA: Diagnosis not present

## 2015-12-26 DIAGNOSIS — R278 Other lack of coordination: Secondary | ICD-10-CM | POA: Diagnosis not present

## 2015-12-27 DIAGNOSIS — R278 Other lack of coordination: Secondary | ICD-10-CM | POA: Diagnosis not present

## 2015-12-27 DIAGNOSIS — M84463A Pathological fracture, right fibula, initial encounter for fracture: Secondary | ICD-10-CM | POA: Diagnosis not present

## 2015-12-27 DIAGNOSIS — Z9181 History of falling: Secondary | ICD-10-CM | POA: Diagnosis not present

## 2015-12-27 DIAGNOSIS — M6281 Muscle weakness (generalized): Secondary | ICD-10-CM | POA: Diagnosis not present

## 2015-12-27 DIAGNOSIS — Z4789 Encounter for other orthopedic aftercare: Secondary | ICD-10-CM | POA: Diagnosis not present

## 2015-12-28 DIAGNOSIS — Z9181 History of falling: Secondary | ICD-10-CM | POA: Diagnosis not present

## 2015-12-28 DIAGNOSIS — Z4789 Encounter for other orthopedic aftercare: Secondary | ICD-10-CM | POA: Diagnosis not present

## 2015-12-28 DIAGNOSIS — R278 Other lack of coordination: Secondary | ICD-10-CM | POA: Diagnosis not present

## 2015-12-28 DIAGNOSIS — M84463A Pathological fracture, right fibula, initial encounter for fracture: Secondary | ICD-10-CM | POA: Diagnosis not present

## 2015-12-28 DIAGNOSIS — M6281 Muscle weakness (generalized): Secondary | ICD-10-CM | POA: Diagnosis not present

## 2016-01-08 DIAGNOSIS — M170 Bilateral primary osteoarthritis of knee: Secondary | ICD-10-CM | POA: Diagnosis not present

## 2016-01-08 DIAGNOSIS — M25561 Pain in right knee: Secondary | ICD-10-CM | POA: Diagnosis not present

## 2016-01-08 DIAGNOSIS — M25461 Effusion, right knee: Secondary | ICD-10-CM | POA: Diagnosis not present

## 2016-01-25 DIAGNOSIS — B351 Tinea unguium: Secondary | ICD-10-CM | POA: Diagnosis not present

## 2016-03-28 ENCOUNTER — Other Ambulatory Visit: Payer: Self-pay | Admitting: Internal Medicine

## 2016-04-10 ENCOUNTER — Non-Acute Institutional Stay: Payer: Medicare Other | Admitting: Internal Medicine

## 2016-04-10 ENCOUNTER — Telehealth: Payer: Self-pay | Admitting: Cardiology

## 2016-04-10 ENCOUNTER — Encounter: Payer: Self-pay | Admitting: Internal Medicine

## 2016-04-10 VITALS — BP 148/78 | HR 97 | Temp 98.9°F | Wt 190.0 lb

## 2016-04-10 DIAGNOSIS — G4700 Insomnia, unspecified: Secondary | ICD-10-CM

## 2016-04-10 DIAGNOSIS — F1020 Alcohol dependence, uncomplicated: Secondary | ICD-10-CM | POA: Diagnosis not present

## 2016-04-10 DIAGNOSIS — R110 Nausea: Secondary | ICD-10-CM

## 2016-04-10 DIAGNOSIS — W19XXXA Unspecified fall, initial encounter: Secondary | ICD-10-CM | POA: Diagnosis not present

## 2016-04-10 DIAGNOSIS — R000 Tachycardia, unspecified: Secondary | ICD-10-CM | POA: Diagnosis not present

## 2016-04-10 DIAGNOSIS — I480 Paroxysmal atrial fibrillation: Secondary | ICD-10-CM | POA: Diagnosis not present

## 2016-04-10 DIAGNOSIS — I10 Essential (primary) hypertension: Secondary | ICD-10-CM | POA: Diagnosis not present

## 2016-04-10 DIAGNOSIS — R251 Tremor, unspecified: Secondary | ICD-10-CM | POA: Diagnosis not present

## 2016-04-10 MED ORDER — ONDANSETRON HCL 4 MG PO TABS: 4.0000 mg | ORAL_TABLET | Freq: Every day | ORAL | Status: DC | PRN

## 2016-04-10 NOTE — Progress Notes (Signed)
Location:   [place] of Service:  Clinic (12)  Provider: Normajean Nash L. Mariea Clonts, D.O., C.M.D.  Code Status: DNR Goals of Care:  Advanced Directives 12/13/2015  Does patient have an advance directive? Yes  Type of Paramedic of [place];Out of facility DNR (pink MOST or yellow form)  Copy of advanced directive(s) in chart? Yes  Pre-existing out of facility DNR order (yellow form or pink MOST form) Yellow form placed in chart (order not valid for inpatient use)     Chief Complaint  Patient presents with  . Medical Management of Chronic Issues    86mth follow-up, recent fall    HPI: Patient is a 82 y.o. female seen today for medical management of chronic diseases.    She fell on Sunday and isn't feeling well today as a result.  Staff are concerned there may have been alcohol involved.  Has been getting more sore by the day.  Today, she's very shaky.  Bruised on left hand, neck, right shoulder and has generalized soreness.  She was at her bedside, knees gave out and hit her neck on the top of her walker.  She is going to have someone with her for 24 hrs for the next day.  Using some tylenol once a day.  Dr. Ninfa Linden has her on one hydrocodone per day--was having days she did not have to take it and usually uses at night.  Rates pain sitting in her wheelchair as 1/10, but remains tachycardic.    Has been nauseous even before her fall.  Is coughing down the phenergan 25mg  daily early in the day.  Is willing to try zofran.  was supposed to see Dr. Marvell Fuller go in April as recommended.  She hadn't had any runs of afib.  Now is having tachycardia, regular rate even with pain only 1/10.  Unclear why.  Is taking toprol as directed.    3 years ago, she had an admission for encephalitis and got a boatload of abx for that.  First symptoms then were tremors and high fever.    Say she is drinking two scotch/seltzer drinks at dinner.  Past Medical History    Diagnosis Date  . A-fib ([place])   . Hypertension   . Arthritis   . Cataracts, bilateral   . Other acariasis     peripherial neuropathy  . History of stomach ulcers   . GI bleed 06/2002  . UTI (lower urinary tract infection)     pt state uti w/ no pain  . PVC (premature ventricular contraction)   . Migraines   . Abdominal pain, unspecified site   . Diverticulosis of colon (without mention of hemorrhage)   . Hemorrhage of gastrointestinal tract, unspecified   . Unspecified hereditary and idiopathic peripheral neuropathy   . Osteoarthrosis, unspecified whether generalized or localized, lower leg   . Palpitations   . Depression   . Other and unspecified alcohol dependence, continuous drinking behavior 10/28/2012  . Nausea alone 10/28/2012  . Obesity, unspecified 10/27/2012  . Diverticulosis of colon (without mention of hemorrhage) 10/27/2012  . Insomnia, unspecified 10/27/2012  . Edema 10/27/2012  . Localized osteoarthrosis not specified whether primary or secondary, lower leg 05/31/2011  . Abnormality of gait 06/02/2013    Past Surgical History  Procedure Laterality Date  . Breast reduction surgery  12/23/2003  . Cholecystectomy  2005  . Cataracts    . Arthroscopic repair acl    . Tonsillectomy and adenoidectomy  1941  .  Breast biopsy  1973    left    Allergies  Allergen Reactions  . Mirtazapine     tremor  . Nickel Other (See Comments)    inflammation  . Other     Prednisone causes sleep disturbances      Medication List       This list is accurate as of: 04/10/16  2:30 PM.  Always use your most recent med list.               acetaminophen 325 MG tablet  Commonly known as:  TYLENOL  Take 650 mg by mouth every 4 (four) hours as needed. For pain     ELIQUIS 5 MG Tabs tablet  Generic drug:  apixaban  Take 5 mg by mouth 2 (two) times daily.     flecainide 50 MG tablet  Commonly known as:  TAMBOCOR  Take 50 mg by mouth 2 (two) times daily.     HYDROcodone-acetaminophen  5-325 MG tablet  Commonly known as:  NORCO/VICODIN  Take 1-2 tablets by mouth every 4 (four) hours as needed for moderate pain.     losartan 100 MG tablet  Commonly known as:  COZAAR  Take 100 mg by mouth daily.     metoprolol succinate 50 MG 24 hr tablet  Commonly known as:  TOPROL XL  Take 1 tablet (50 mg total) by mouth daily. Take with or immediately following a meal.     multivitamin with minerals Tabs tablet  Take 1 tablet by mouth daily.     promethazine 25 MG tablet  Commonly known as:  PHENERGAN  Take 1 tablet (25 mg total) by mouth daily as needed for nausea. For nausea and vomiting     sodium chloride 0.65 % Soln nasal spray  Commonly known as:  OCEAN  Place 1 spray into both nostrils daily as needed for congestion.     zolpidem 10 MG tablet  Commonly known as:  AMBIEN  TAKE 1 TABLET BY MOUTH EVERY NIGHT AT BEDTIME AS NEEDED FOR INSOMNIA        Review of Systems:  Review of Systems  Constitutional: Negative for fever and chills.       Says her temp is higher than baseline  HENT: Negative for hearing loss.   Eyes: Blurred vision: glasses.  Respiratory: Positive for cough. Negative for shortness of breath.   Cardiovascular: Negative for chest pain, palpitations and leg swelling.  Gastrointestinal: Positive for diarrhea and constipation. Negative for abdominal pain, blood in stool and melena.  Musculoskeletal: Positive for myalgias and falls.  Skin: Negative for rash.  Neurological: Negative for dizziness and loss of consciousness.  Endo/Heme/Allergies: Bruises/bleeds easily.  Psychiatric/Behavioral: Negative for depression and memory loss. The patient has insomnia.     Health Maintenance  Topic Date Due  . DEXA SCAN  05/08/2000  . INFLUENZA VACCINE  04/23/2016  . TETANUS/TDAP  09/23/2020  . ZOSTAVAX  Completed  . PNA vac Low Risk Adult  Completed    Physical Exam: Filed Vitals:   04/10/16 1354  BP: 148/78  Pulse: 97  Temp: 98.9 F (37.2 C)    TempSrc: Oral  Weight: 190 lb (86.183 kg)  SpO2: 95%   Body mass index is 29.75 kg/(m^2). Physical Exam  Constitutional: She is oriented to person, place, and time. She appears well-developed and well-nourished. No distress.  Cardiovascular: Regular rhythm, normal heart sounds and intact distal pulses.   tachy  Pulmonary/Chest: Effort normal and breath sounds normal. No  respiratory distress.  Musculoskeletal: Normal range of motion.  Neurological: She is alert and oriented to person, place, and time.  Did not know exact date  Skin: Skin is warm and dry.  large ecchymoses of right shoulder, neck, small ones on forearms, upper arms; large on left hand  Psychiatric: She has a normal mood and affect.    Labs reviewed: Basic Metabolic Panel:  Recent Labs  10/03/15 12/04/15 1619  NA 141 136  K 4.7 4.0  CL  --  101  CO2  --  24  GLUCOSE  --  130*  BUN 14 12  CREATININE 0.9 0.76  CALCIUM  --  9.0   Liver Function Tests:  Recent Labs  10/03/15  AST 16  ALT 10  ALKPHOS 64   No results for input(s): LIPASE, AMYLASE in the last 8760 hours. No results for input(s): AMMONIA in the last 8760 hours. CBC:  Recent Labs  10/03/15 12/04/15 1619  WBC 7.5 6.1  NEUTROABS  --  4.6  HGB 14.9 14.1  HCT 42 41.5  MCV  --  94.1  PLT 231 218   Lipid Panel:  Recent Labs  10/03/15  CHOL 171  HDL 63  LDLCALC 81  TRIG 136   Lab Results  Component Value Date   HGBA1C 5.3 10/03/2015      Assessment/Plan 1. Nausea without vomiting - to avoid confusion and fall risk that comes with phenergan, will d/c it and change to zofran - ondansetron (ZOFRAN) 4 MG tablet; Take 1 tablet (4 mg total) by mouth daily as needed for nausea.  Dispense: 30 tablet; Refill: 0  2. Tachycardia -cause unclear as report pain only 1/10 at rest during exam, but HR upper 90s/lower hundreds as I listened. --advised she f/u with Dr. Martinique as she was directed to in April  3. Alcohol dependence,  continuous ([place]) -still drinking two scotch drinks with dinner and says no more, but also takes ambien, phenergan and hydrocodone now -? Any withdrawal contributing to her tachycardia and htn  4. Paroxysmal atrial fibrillation (HCC) -seems to be in sinus now and says she can feel when she's in afa -cont flecainide  5. Essential hypertension -bp elevated slightly today so monitor  6. Insomnia -continues on ambien at her adamant request  7. Tremor -has had for several months--seems alcohol-related vs. essential  8. Fall, initial encounter -no major injury but does have a lot of bruising and myalgias  Labs/tests ordered:  No new; schedule f/u with Dr. Martinique as was overdue three mos ago  Next appt:  07/10/2016  [name] L. Hristopher Missildine, D.O. [facility] [address], [place] [address] Cell Phone (Mon-Fri 8am-5pm):  [phone] On Call:  [phone] & follow prompts after 5pm & weekends Office Phone:  [phone] Office Fax:  [phone]

## 2016-04-10 NOTE — Telephone Encounter (Signed)
New message     Nurse is at pt home and states that her HR is 110 and she is weak.  Her bp is 160/90.  Please advise

## 2016-04-10 NOTE — Telephone Encounter (Signed)
Received call from [facility] at [place] [facility] regarding pt-pt is c/o generalized weakness and fatigue since this AM when she woke up.  Nurse at [facility] reports pt HR is 110 apical, pt denies SOB and CP.  Spoke with pt on the phone, no distress noted, able to speak in full complete sentences.  Pt reports being sore from a fall Sunday night.  Per Nurse pt is being seen by PCP Reed at 2pm today.  Recommended PCP to evaluate her and to contact office if PCP recommends cardiac evaluation or recommendation.

## 2016-04-12 ENCOUNTER — Other Ambulatory Visit: Payer: Self-pay | Admitting: Internal Medicine

## 2016-04-18 DIAGNOSIS — B351 Tinea unguium: Secondary | ICD-10-CM | POA: Diagnosis not present

## 2016-04-26 ENCOUNTER — Other Ambulatory Visit: Payer: Self-pay | Admitting: Nurse Practitioner

## 2016-04-27 ENCOUNTER — Encounter (HOSPITAL_COMMUNITY): Payer: Self-pay | Admitting: *Deleted

## 2016-04-27 ENCOUNTER — Emergency Department (HOSPITAL_COMMUNITY): Payer: Medicare Other

## 2016-04-27 ENCOUNTER — Emergency Department (HOSPITAL_COMMUNITY)
Admission: EM | Admit: 2016-04-27 | Discharge: 2016-04-27 | Disposition: A | Discharge status: Home / Self Care | Payer: Medicare Other | Attending: Physician Assistant | Admitting: Physician Assistant

## 2016-04-27 DIAGNOSIS — I10 Essential (primary) hypertension: Secondary | ICD-10-CM | POA: Insufficient documentation

## 2016-04-27 DIAGNOSIS — Z79899 Other long term (current) drug therapy: Secondary | ICD-10-CM | POA: Diagnosis not present

## 2016-04-27 DIAGNOSIS — K5732 Diverticulitis of large intestine without perforation or abscess without bleeding: Secondary | ICD-10-CM | POA: Diagnosis not present

## 2016-04-27 DIAGNOSIS — Z87891 Personal history of nicotine dependence: Secondary | ICD-10-CM | POA: Diagnosis not present

## 2016-04-27 DIAGNOSIS — R030 Elevated blood-pressure reading, without diagnosis of hypertension: Secondary | ICD-10-CM | POA: Diagnosis not present

## 2016-04-27 DIAGNOSIS — K5900 Constipation, unspecified: Secondary | ICD-10-CM | POA: Diagnosis present

## 2016-04-27 LAB — COMPREHENSIVE METABOLIC PANEL
ALT: 13 U/L — AB (ref 14–54)
AST: 15 U/L (ref 15–41)
Albumin: 4.4 g/dL (ref 3.5–5.0)
Alkaline Phosphatase: 89 U/L (ref 38–126)
Anion gap: 10 (ref 5–15)
BUN: 17 mg/dL (ref 6–20)
CHLORIDE: 106 mmol/L (ref 101–111)
CO2: 24 mmol/L (ref 22–32)
CREATININE: 0.76 mg/dL (ref 0.44–1.00)
Calcium: 9 mg/dL (ref 8.9–10.3)
GFR calc non Af Amer: >60 mL/min (ref >60)
Glucose, Bld: 95 mg/dL (ref 65–99)
POTASSIUM: 3.9 mmol/L (ref 3.5–5.1)
SODIUM: 140 mmol/L (ref 135–145)
Total Bilirubin: 1 mg/dL (ref 0.3–1.2)
Total Protein: 7 g/dL (ref 6.5–8.1)

## 2016-04-27 LAB — CBC WITH DIFFERENTIAL/PLATELET
Basophils Absolute: 0 10*3/uL (ref 0.0–0.1)
Basophils Relative: 0 %
EOS ABS: 0.1 10*3/uL (ref 0.0–0.7)
Eosinophils Relative: 1 %
HEMATOCRIT: 43.1 % (ref 36.0–46.0)
HEMOGLOBIN: 14.3 g/dL (ref 12.0–15.0)
LYMPHS ABS: 1.5 10*3/uL (ref 0.7–4.0)
LYMPHS PCT: 22 %
MCH: 31.6 pg (ref 26.0–34.0)
MCHC: 33.2 g/dL (ref 30.0–36.0)
MCV: 95.4 fL (ref 78.0–100.0)
MONOS PCT: 10 %
Monocytes Absolute: 0.7 10*3/uL (ref 0.1–1.0)
NEUTROS ABS: 4.5 10*3/uL (ref 1.7–7.7)
NEUTROS PCT: 67 %
Platelets: 215 10*3/uL (ref 150–400)
RBC: 4.52 MIL/uL (ref 3.87–5.11)
RDW: 15 % (ref 11.5–15.5)
WBC: 6.7 10*3/uL (ref 4.0–10.5)

## 2016-04-27 MED ORDER — POLYETHYLENE GLYCOL 3350 17 G PO PACK
17.0000 g | PACK | Freq: Once | ORAL | Status: AC
  Administered 2016-04-27: 17 g via ORAL
  Filled 2016-04-27: qty 1

## 2016-04-27 MED ORDER — CIPROFLOXACIN HCL 500 MG PO TABS: 500.0000 mg | ORAL_TABLET | Freq: Two times a day (BID) | ORAL | 0 refills | Status: DC

## 2016-04-27 MED ORDER — POLYETHYLENE GLYCOL 3350 17 G PO PACK: 17.0000 g | PACK | Freq: Every day | ORAL | 0 refills | Status: DC

## 2016-04-27 MED ORDER — METRONIDAZOLE 500 MG PO TABS: 500.0000 mg | ORAL_TABLET | Freq: Two times a day (BID) | ORAL | 0 refills | Status: DC

## 2016-04-27 MED ORDER — METRONIDAZOLE 500 MG PO TABS
500.0000 mg | ORAL_TABLET | Freq: Once | ORAL | Status: AC
  Administered 2016-04-27: 500 mg via ORAL
  Filled 2016-04-27: qty 1

## 2016-04-27 MED ORDER — IOPAMIDOL (ISOVUE-300) INJECTION 61%
100.0000 mL | Freq: Once | INTRAVENOUS | Status: AC | PRN
  Administered 2016-04-27: 100 mL via INTRAVENOUS

## 2016-04-27 MED ORDER — CIPROFLOXACIN HCL 500 MG PO TABS
500.0000 mg | ORAL_TABLET | Freq: Once | ORAL | Status: AC
  Administered 2016-04-27: 500 mg via ORAL
  Filled 2016-04-27: qty 1

## 2016-04-27 MED ORDER — FLEET ENEMA 7-19 GM/118ML RE ENEM
1.0000 | ENEMA | Freq: Once | RECTAL | Status: AC
  Administered 2016-04-27: 1 via RECTAL
  Filled 2016-04-27: qty 1

## 2016-04-27 NOTE — ED Provider Notes (Signed)
[facility] DEPT Provider Note   CSN: [id number] Arrival date & time: 04/27/16  1117  First Provider Contact:  None       History   Chief Complaint Chief Complaint  Patient presents with  . Constipation    HPI [name] is a 82 y.o. female.  HPI   82 year old with hisotry of diverticulosis, htn, afib, chronic abdominal pain.  She is here with contstipation.  Her last BM was 6 days ago with the help of digital.  Mild nausea, no vomiting.  No blood in stool.    Her home regimen : She has tried one over the counter laxative.   [facility]  Past Medical History:  Diagnosis Date  . A-fib ([place])   . Abdominal pain, unspecified site   . Abnormality of gait 06/02/2013  . Arthritis   . Cataracts, bilateral   . Depression   . Diverticulosis of colon (without mention of hemorrhage)   . Diverticulosis of colon (without mention of hemorrhage) 10/27/2012  . Edema 10/27/2012  . GI bleed 06/2002  . Hemorrhage of gastrointestinal tract, unspecified   . History of stomach ulcers   . Hypertension   . Insomnia, unspecified 10/27/2012  . Localized osteoarthrosis not specified whether primary or secondary, lower leg 05/31/2011  . Migraines   . Nausea alone 10/28/2012  . Obesity, unspecified 10/27/2012  . Osteoarthrosis, unspecified whether generalized or localized, lower leg   . Other acariasis    peripherial neuropathy  . Other and unspecified alcohol dependence, continuous drinking behavior 10/28/2012  . Palpitations   . PVC (premature ventricular contraction)   . Unspecified hereditary and idiopathic peripheral neuropathy   . UTI (lower urinary tract infection)    pt state uti w/ no pain    Patient Active Problem List   Diagnosis Date Noted  . Acute upper respiratory infections 12/06/2013  . Pruritus 11/08/2013  . Hx of peptic ulcer 06/13/2013  . Hyperglycemia 06/07/2013  . DJD (degenerative joint disease) 06/07/2013  . Hypokalemia 06/06/2013  . Abnormality of gait 06/02/2013  . UTI  (urinary tract infection) 05/21/2013  . Unintentional weight loss 03/29/2013  . Cough 03/29/2013  . Tremor 01/11/2013  . Nausea alone 01/11/2013  . Depression   . Alcohol dependence, continuous ([place]) 10/28/2012  . Insomnia 10/27/2012  . A-fib ([place])   . PVC (premature ventricular contraction)   . Hypertension 10/08/2012    Past Surgical History:  Procedure Laterality Date  . ARTHROSCOPIC REPAIR ACL    . BREAST BIOPSY  1973   left  . BREAST REDUCTION SURGERY  12/23/2003  . cataracts    . CHOLECYSTECTOMY  2005  . TONSILLECTOMY AND ADENOIDECTOMY  1941    OB History    No data available       Home Medications    Prior to Admission medications   Medication Sig Start Date End Date Taking? Authorizing Provider  acetaminophen (TYLENOL) 325 MG tablet Take 650 mg by mouth every 4 (four) hours as needed. For pain   Yes Historical Provider, MD  ELIQUIS 5 MG TABS tablet TAKE 1 TABLET BY MOUTH TWICE DAILY 04/12/16  Yes Tiffany L Reed, DO  flecainide (TAMBOCOR) 50 MG tablet Take 50 mg by mouth 2 (two) times daily.   Yes Historical Provider, MD  HYDROcodone-acetaminophen (NORCO/VICODIN) 5-325 MG tablet Take 1-2 tablets by mouth every 4 (four) hours as needed for moderate pain.   Yes Historical Provider, MD  losartan (COZAAR) 100 MG tablet Take 100 mg by mouth  daily.   Yes Historical Provider, MD  methocarbamol (ROBAXIN) 500 MG tablet Take 500 mg by mouth every 6 (six) hours as needed for muscle spasms.   Yes Historical Provider, MD  metoprolol succinate (TOPROL XL) 50 MG 24 hr tablet Take 1 tablet (50 mg total) by mouth daily. Take with or immediately following a meal. 06/23/15  Yes Peter M Martinique, MD  ondansetron (ZOFRAN) 4 MG tablet Take 1 tablet (4 mg total) by mouth daily as needed for nausea. 04/10/16  Yes Tiffany L Reed, DO  Phenylephrine HCl (NEO-SYNEPHRINE NA) Place 1 spray into the nose 2 (two) times daily as needed (congestion).   Yes Historical Provider, MD  sodium chloride (OCEAN)  0.65 % SOLN nasal spray Place 1 spray into both nostrils daily as needed for congestion.   Yes Historical Provider, MD  zolpidem (AMBIEN) 10 MG tablet TAKE 1 TABLET BY MOUTH EVERY NIGHT AT BEDTIME AS NEEDED FOR INSOMNIA Patient taking differently: TAKE 1 TABLET BY MOUTH EVERY NIGHT AT BEDTIME 03/28/16  Yes Lauree Chandler, NP  ciprofloxacin (CIPRO) 500 MG tablet Take 1 tablet (500 mg total) by mouth 2 (two) times daily. 04/27/16   Alvaretta Eisenberger Lyn Bentlie Withem, MD  metroNIDAZOLE (FLAGYL) 500 MG tablet Take 1 tablet (500 mg total) by mouth 2 (two) times daily. 04/27/16   Jenilyn Magana Lyn Carolie Mcilrath, MD  polyethylene glycol (MIRALAX / GLYCOLAX) packet Take 17 g by mouth daily. 04/27/16   Elias Bordner Lyn Shandell Jallow, MD    Family History No family history on file.  Social History Social History  Substance Use Topics  . Smoking status: Former Smoker    Types: Cigarettes    Quit date: 10/09/1983  . Smokeless tobacco: Never Used  . Alcohol use 0.0 oz/week    1 - 2 Shots of liquor per week     Comment: daily  scotch      Allergies   Mirtazapine; Nickel; Other; and Prednisone   Review of Systems Review of Systems  Constitutional: Negative for activity change.  Respiratory: Negative for shortness of breath.   Cardiovascular: Negative for chest pain.  Gastrointestinal: Positive for abdominal pain.     Physical Exam Updated Vital Signs BP 200/86 (BP Location: Left Arm)   Pulse 74   Temp 99.2 F (37.3 C)   Resp 20   Ht 5' 7.75" (1.721 m)   Wt 190 lb (86.2 kg)   SpO2 96%   BMI 29.10 kg/m   Physical Exam  Constitutional: She appears well-developed and well-nourished. No distress.  HENT:  Head: Normocephalic and atraumatic.  Eyes: Conjunctivae are normal.  Neck: Neck supple.  Cardiovascular: Normal rate and regular rhythm.   No murmur heard. Pulmonary/Chest: Effort normal and breath sounds normal. No respiratory distress.  Abdominal: Soft. There is tenderness.  Tenderness to LLQ  Genitourinary:    Genitourinary Comments: No stool in vault  Musculoskeletal: She exhibits no edema.  Neurological: She is alert.  Skin: Skin is warm and dry.  Psychiatric: She has a normal mood and affect.  Nursing note and vitals reviewed.    ED Treatments / Results  Labs (all labs ordered are listed, but only abnormal results are displayed) Labs Reviewed  COMPREHENSIVE METABOLIC PANEL - Abnormal; Notable for the following:       Result Value   ALT 13 (*)    All other components within normal limits  CBC WITH DIFFERENTIAL/PLATELET    EKG  EKG Interpretation None       Radiology Ct Abdomen Pelvis W  Contrast  Result Date: 04/27/2016 CLINICAL DATA:  Constipation. EXAM: CT ABDOMEN AND PELVIS WITH CONTRAST TECHNIQUE: Multidetector CT imaging of the abdomen and pelvis was performed using the standard protocol following bolus administration of intravenous contrast. CONTRAST:  137mL ISOVUE-300 IOPAMIDOL (ISOVUE-300) INJECTION 61% COMPARISON:  08/17/2012 FINDINGS: Lower chest: The lung bases appear clear. No pleural or pericardial effusion. Hepatobiliary: The liver appears normal. Previous cholecystectomy. No biliary dilatation. Pancreas: No mass, inflammatory changes, or other significant abnormality. Spleen: Within normal limits in size and appearance. Adrenals/Urinary Tract: Normal adrenal glands. There is mild bilateral cortical volume loss. No hydronephrosis or mass identified. The urinary bladder appears normal. Stomach/Bowel: The stomach appears normal. The small bowel loops have a normal course and caliber. The proximal colon appears normal. Numerous distal colonic diverticula identified. Although no focal inflamed diverticula is noted, there is a small amount of fluid within the left iliac fossa and there is mild fat stranding associated with the sigmoid colon. No pathologic dilatation of the colon. Vascular/Lymphatic: Calcified atherosclerotic disease involves the abdominal aorta. No aneurysm. No  enlarged retroperitoneal or mesenteric adenopathy. No enlarged pelvic or inguinal lymph nodes. Reproductive: The uterus and adnexal structures are unremarkable. Other: Small amount of free fluid identified within the pelvis. No abscess. No free intraperitoneal air. Musculoskeletal: No suspicious bone lesions identified. No aggressive lytic or sclerotic bone lesions. The bones appear osteopenic. Multi level thoracic degenerative disc disease noted. IMPRESSION: 1. Suspect mild uncomplicated sigmoid diverticulitis. See discussion above. 2. No evidence for bowel perforation or abscess. 3. Aortic atherosclerosis 4. Previous cholecystectomy. Electronically Signed   By: Kerby Moors M.D.   On: 04/27/2016 14:59    Procedures Procedures (including critical care time)  Medications Ordered in ED Medications  sodium phosphate (FLEET) 7-19 GM/118ML enema 1 enema (not administered)  ciprofloxacin (CIPRO) tablet 500 mg (not administered)  metroNIDAZOLE (FLAGYL) tablet 500 mg (not administered)  polyethylene glycol (MIRALAX / GLYCOLAX) packet 17 g (not administered)  iopamidol (ISOVUE-300) 61 % injection 100 mL (100 mLs Intravenous Contrast Given 04/27/16 1407)     Initial Impression / Assessment and Plan / ED Course  I have reviewed the triage vital signs and the nursing notes.  Pertinent labs & imaging results that were available during my care of the patient were reviewed by me and considered in my medical decision making (see chart for details).  Clinical Course    Patient is a 82 year old presenting with constipation. Patient has not really tried anything at home. She has impressive tenderness on exam so we will get CT.  Vault empty so no ability to manually disimpact. If normal, Will give Miralax with instructions to increase till stooling.    4:15 PM CT shows diverticulitis.  Will treat, will send home with Miralax per patient request, though I think the uncomfortable feelign she has is likely from  the infection rather than constipation.  We discussed this and she will follow up with PCP as needed.   patietn very comfortable on discharge, asking to go home. Taking PO and with normal vital signs. (htn is known)    Final Clinical Impressions(s) / ED Diagnoses   Final diagnoses:  Diverticulitis of large intestine without perforation or abscess without bleeding    New Prescriptions New Prescriptions   CIPROFLOXACIN (CIPRO) 500 MG TABLET    Take 1 tablet (500 mg total) by mouth 2 (two) times daily.   METRONIDAZOLE (FLAGYL) 500 MG TABLET    Take 1 tablet (500 mg total) by mouth 2 (  two) times daily.   POLYETHYLENE GLYCOL (MIRALAX / GLYCOLAX) PACKET    Take 17 g by mouth daily.     Karisa Nesser Julio Alm, MD 04/27/16 1616

## 2016-04-27 NOTE — ED Notes (Signed)
Went in to give pt enema as ordered.  States "Oh that does not go up far enough."  Pt states that there is "nothing in my rectum, it's all up here" pointing at her stomach.  She states that she then needs a soap suds enema.  Explained to pt that the soap suds enema is not inserted that high up either.  Pt wants to hold off on the fleets enema at this time

## 2016-04-27 NOTE — ED Notes (Signed)
Pt took her dose of Eloquis from home.  Okayed by [facility] EDP

## 2016-04-27 NOTE — ED Notes (Signed)
[name] EDP aware of pt's elevated BP

## 2016-04-27 NOTE — ED Triage Notes (Signed)
Per EMS, pt from [facility], reports constipation.  LBM-6 days ago.  Reports abd discomfort.  Denies n/v.  Pt is A&Ox 4.

## 2016-04-27 NOTE — Discharge Instructions (Signed)
You were found to have diverticulitis, likely a small infection in your bowel. Please take the antibiotics as perscribed.  You were also having feelings of constipation- we reocmmend that you take Miralax, one packet every 6 hours until you are stooling.  Then just take one packet daily for maitenance as needed.

## 2016-04-27 NOTE — ED Notes (Signed)
Patient transported to CT 

## 2016-04-29 ENCOUNTER — Other Ambulatory Visit: Payer: Self-pay | Admitting: Nurse Practitioner

## 2016-05-06 ENCOUNTER — Other Ambulatory Visit: Payer: Self-pay | Admitting: Internal Medicine

## 2016-05-08 ENCOUNTER — Non-Acute Institutional Stay: Payer: Medicare Other | Admitting: Internal Medicine

## 2016-05-08 ENCOUNTER — Encounter: Payer: Self-pay | Admitting: Internal Medicine

## 2016-05-08 VITALS — BP 150/70 | HR 88 | Temp 97.7°F | Ht 68.0 in | Wt 193.0 lb

## 2016-05-08 DIAGNOSIS — K5732 Diverticulitis of large intestine without perforation or abscess without bleeding: Secondary | ICD-10-CM

## 2016-05-08 DIAGNOSIS — I480 Paroxysmal atrial fibrillation: Secondary | ICD-10-CM | POA: Diagnosis not present

## 2016-05-08 DIAGNOSIS — R110 Nausea: Secondary | ICD-10-CM

## 2016-05-08 DIAGNOSIS — F1020 Alcohol dependence, uncomplicated: Secondary | ICD-10-CM

## 2016-05-08 DIAGNOSIS — R296 Repeated falls: Secondary | ICD-10-CM | POA: Diagnosis not present

## 2016-05-08 DIAGNOSIS — I10 Essential (primary) hypertension: Secondary | ICD-10-CM | POA: Diagnosis not present

## 2016-05-08 DIAGNOSIS — R531 Weakness: Secondary | ICD-10-CM | POA: Diagnosis not present

## 2016-05-08 NOTE — Progress Notes (Signed)
Location:  [facility]   Place of Service:   clinic  Provider: Thresa Dozier L. Mariea Clonts, D.O., C.M.D.  Code Status: DNR Goals of Care:  Advanced Directives 04/27/2016  Does patient have an advance directive? Yes  Type of Advance Directive -  Does patient want to make changes to advanced directive? -  Copy of advanced directive(s) in chart? -  Pre-existing out of facility DNR order (yellow form or pink MOST form) -     Chief Complaint  Patient presents with  . Acute Visit    follow-up from ER    HPI: Patient is a 82 y.o. female seen today for an acute visit for ED f/u--she has a h/o diverticulosis, htn, afib, and chronic abdominal pain. On 04/27/16, she was mildly nauseated and had heme negative stool, no vomiting.  She had LLQ pain and no stool in her rectal vault.  CT showed diverticulitis.  She was sent home with miralax due to constipation concerns and cipro and flagyl bid.  She thought she had a high impaction.  She stopped her cipro after 7 days b/c she was getting weaker and weaker.  Did complete the flagyl.  She continued to drink her scotch while on flagyl which is probably why she felt so bad.  Apparently, she's been taking her miralax daily and has been having diarrhea now.  She had some in the clinic bathroom.  She says it did not work until 4 doses in.  She's had urgency, smearing and a ton of flatus.  Abdominal pain is improved.    She still has wounds from her fall Tuesday and requests a dressing change.  It happened when in the kitchen and she was going to have carrot cake for dessert when she lost her balance and fell.   She has a huge bruised area under her upper arm from the Hall chair.  Right forearm has a wound--about 1.5 inch laceration with steristrips in place and a cushioned dressing.  Also hit her head and has a bruise on the left side of her head behind her ear.  She has ordered probiotics and started taking them.    Past Medical History:  Diagnosis Date  .  A-fib ([place])   . Abdominal pain, unspecified site   . Abnormality of gait 06/02/2013  . Arthritis   . Cataracts, bilateral   . Depression   . Diverticulosis of colon (without mention of hemorrhage)   . Diverticulosis of colon (without mention of hemorrhage) 10/27/2012  . Edema 10/27/2012  . GI bleed 06/2002  . Hemorrhage of gastrointestinal tract, unspecified   . History of stomach ulcers   . Hypertension   . Insomnia, unspecified 10/27/2012  . Localized osteoarthrosis not specified whether primary or secondary, lower leg 05/31/2011  . Migraines   . Nausea alone 10/28/2012  . Obesity, unspecified 10/27/2012  . Osteoarthrosis, unspecified whether generalized or localized, lower leg   . Other acariasis    peripherial neuropathy  . Other and unspecified alcohol dependence, continuous drinking behavior 10/28/2012  . Palpitations   . PVC (premature ventricular contraction)   . Unspecified hereditary and idiopathic peripheral neuropathy   . UTI (lower urinary tract infection)    pt state uti w/ no pain    Past Surgical History:  Procedure Laterality Date  . ARTHROSCOPIC REPAIR ACL    . BREAST BIOPSY  1973   left  . BREAST REDUCTION SURGERY  12/23/2003  . cataracts    . CHOLECYSTECTOMY  2005  . TONSILLECTOMY  AND ADENOIDECTOMY  1941    Allergies  Allergen Reactions  . Mirtazapine     tremor  . Nickel Other (See Comments)    inflammation  . Other Itching    Acrylic nails  . Prednisone     Causes sleep disturbances       Medication List       Accurate as of 05/08/16  8:41 AM. Always use your most recent med list.          acetaminophen 325 MG tablet Commonly known as:  TYLENOL Take 650 mg by mouth every 4 (four) hours as needed. For pain   ELIQUIS 5 MG Tabs tablet Generic drug:  apixaban TAKE 1 TABLET BY MOUTH TWICE DAILY   flecainide 50 MG tablet Commonly known as:  TAMBOCOR TAKE 1 TABLET BY MOUTH TWICE DAILY FOR ATRIAL FIBRILLATION   HYDROcodone-acetaminophen 5-325 MG  tablet Commonly known as:  NORCO/VICODIN Take 1-2 tablets by mouth every 4 (four) hours as needed for moderate pain.   losartan 100 MG tablet Commonly known as:  COZAAR TAKE 1 TABLET BY MOUTH DAILY   methocarbamol 500 MG tablet Commonly known as:  ROBAXIN Take 500 mg by mouth every 6 (six) hours as needed for muscle spasms.   metoprolol succinate 50 MG 24 hr tablet Commonly known as:  TOPROL XL Take 1 tablet (50 mg total) by mouth daily. Take with or immediately following a meal.   NEO-SYNEPHRINE NA Place 1 spray into the nose 2 (two) times daily as needed (congestion).   ondansetron 4 MG tablet Commonly known as:  ZOFRAN Take 1 tablet (4 mg total) by mouth daily as needed for nausea.   sodium chloride 0.65 % Soln nasal spray Commonly known as:  OCEAN Place 1 spray into both nostrils daily as needed for congestion.   zolpidem 10 MG tablet Commonly known as:  AMBIEN TAKE 1 TABLET BY MOUTH AT BEDTIME AS NEEDED FOR INSOMNIA       Review of Systems:  Review of Systems  Constitutional: Positive for malaise/fatigue. Negative for chills and fever.  HENT: Negative for congestion.   Eyes: Negative for blurred vision.       Glasses  Respiratory: Negative for cough and shortness of breath.   Cardiovascular: Positive for palpitations. Negative for chest pain and leg swelling.  Gastrointestinal: Positive for constipation and nausea. Negative for abdominal pain, blood in stool, diarrhea, heartburn, melena and vomiting.       Diverticultis  Genitourinary: Negative for dysuria.  Musculoskeletal: Positive for falls, joint pain and myalgias.       Left elbow where lac is   Skin: Negative for itching and rash.       Bruises from falls especially beneath left upper arm into axilla  Neurological: Positive for weakness. Negative for dizziness, loss of consciousness and headaches.  Endo/Heme/Allergies: Bruises/bleeds easily.  Psychiatric/Behavioral: Positive for memory loss.       Waxing  and waning mentation due to alcohol, benzo, recent pain regimen s/p fall, weakness    Health Maintenance  Topic Date Due  . DEXA SCAN  05/08/2000  . INFLUENZA VACCINE  04/23/2016  . TETANUS/TDAP  09/23/2020  . ZOSTAVAX  Completed  . PNA vac Low Risk Adult  Completed    Physical Exam: Vitals:   05/08/16 0830  BP: (!) 150/70  Pulse: 88  Temp: 97.7 F (36.5 C)  TempSrc: Oral  SpO2: 95%  Weight: 193 lb (87.5 kg)  Height: 5\' 8"  (1.727 m)   Body  mass index is 29.35 kg/m. Physical Exam  Constitutional: She is oriented to person, place, and time. She appears well-developed and well-nourished. No distress.  Cardiovascular: Normal rate, regular rhythm, normal heart sounds and intact distal pulses.   Pulmonary/Chest: Effort normal and breath sounds normal.  Abdominal: Soft. Bowel sounds are normal. She exhibits no distension and no mass. There is no tenderness. There is no rebound and no guarding. No hernia.  Musculoskeletal: Normal range of motion. She exhibits tenderness.  Left elbow and upper arm  Neurological: She is alert and oriented to person, place, and time. No cranial nerve deficit.  Skin: Skin is warm and dry.  Left axilla and posterior upper arm with large dark ecchymoses with hard area palpable during exam in epitrochlear area, skin tear present that is slowly healing  Psychiatric:  Sarcastic sense of humor, downgrades symptoms at appts    Labs reviewed: Basic Metabolic Panel:  Recent Labs  10/03/15 12/04/15 1619 04/27/16 1256  NA 141 136 140  K 4.7 4.0 3.9  CL  --  101 106  CO2  --  24 24  GLUCOSE  --  130* 95  BUN 14 12 17   CREATININE 0.9 0.76 0.76  CALCIUM  --  9.0 9.0   Liver Function Tests:  Recent Labs  10/03/15 04/27/16 1256  AST 16 15  ALT 10 13*  ALKPHOS 64 89  BILITOT  --  1.0  PROT  --  7.0  ALBUMIN  --  4.4   No results for input(s): LIPASE, AMYLASE in the last 8760 hours. No results for input(s): AMMONIA in the last 8760  hours. CBC:  Recent Labs  10/03/15 12/04/15 1619 04/27/16 1256  WBC 7.5 6.1 6.7  NEUTROABS  --  4.6 4.5  HGB 14.9 14.1 14.3  HCT 42 41.5 43.1  MCV  --  94.1 95.4  PLT 231 218 215   Lipid Panel:  Recent Labs  10/03/15  CHOL 171  HDL 63  LDLCALC 81  TRIG 136   Lab Results  Component Value Date   HGBA1C 5.3 10/03/2015    Procedures since last visit: Ct Abdomen Pelvis W Contrast  Result Date: 04/27/2016 CLINICAL DATA:  Constipation. EXAM: CT ABDOMEN AND PELVIS WITH CONTRAST TECHNIQUE: Multidetector CT imaging of the abdomen and pelvis was performed using the standard protocol following bolus administration of intravenous contrast. CONTRAST:  133mL ISOVUE-300 IOPAMIDOL (ISOVUE-300) INJECTION 61% COMPARISON:  08/17/2012 FINDINGS: Lower chest: The lung bases appear clear. No pleural or pericardial effusion. Hepatobiliary: The liver appears normal. Previous cholecystectomy. No biliary dilatation. Pancreas: No mass, inflammatory changes, or other significant abnormality. Spleen: Within normal limits in size and appearance. Adrenals/Urinary Tract: Normal adrenal glands. There is mild bilateral cortical volume loss. No hydronephrosis or mass identified. The urinary bladder appears normal. Stomach/Bowel: The stomach appears normal. The small bowel loops have a normal course and caliber. The proximal colon appears normal. Numerous distal colonic diverticula identified. Although no focal inflamed diverticula is noted, there is a small amount of fluid within the left iliac fossa and there is mild fat stranding associated with the sigmoid colon. No pathologic dilatation of the colon. Vascular/Lymphatic: Calcified atherosclerotic disease involves the abdominal aorta. No aneurysm. No enlarged retroperitoneal or mesenteric adenopathy. No enlarged pelvic or inguinal lymph nodes. Reproductive: The uterus and adnexal structures are unremarkable. Other: Small amount of free fluid identified within the pelvis.  No abscess. No free intraperitoneal air. Musculoskeletal: No suspicious bone lesions identified. No aggressive lytic or sclerotic bone  lesions. The bones appear osteopenic. Multi level thoracic degenerative disc disease noted. IMPRESSION: 1. Suspect mild uncomplicated sigmoid diverticulitis. See discussion above. 2. No evidence for bowel perforation or abscess. 3. Aortic atherosclerosis 4. Previous cholecystectomy. Electronically Signed   By: Kerby Moors M.D.   On: 04/27/2016 14:59    Assessment/Plan 1. Diverticulitis of colon -seems associated symptoms of this and fevers have resolved -she is a poor historian, downgrading symptoms at appts, but then later c/o them in the ED or to nursing staff, caregivers -she did not complete her cipro -did complete flagyl and then had more GI upset due to her alcohol intake--this also seems improved--she has some degree of chronic nausea (likely related to her scotch and meds)  2. Nausea without vomiting -persists intermittently -was initially due to diverticultis, then flagyl with alcohol  3. Alcohol dependence, continuous ([place]) -continues to drink "2 scotches" per night, also takes [place] and is unsteady to begin with -this is causing more falls and injuries with pain and need for meds -she has caregivers part time right now, but they are not there if she gets up at night which has been the trend (getting up after taking her sedative that she demands she receive, trying to get to dessert in her kitchen and falling) -really needs to cut down at her age and increasingly frail state and in combination with her medications (still lives in [place])  4. Paroxysmal atrial fibrillation ([place]) -continues on toprol xl and flecainide and eliquis  -still has periods of palpitations -is to f/u with cardiology as scheduled  5. Frequent falls -see above -due to alcohol, pain meds, sedative and instability at baseline ever since leg fracture  6. Essential  hypertension -has been elevated -also has been tachycardic (did improve over course of appt), but she is likely withdrawing from alcohol as she's been unable to take much po recently) -if bps continue to be over 150/90, will need to make adjustments  7. Generalized weakness -ongoing, is using walker when out of wheelchair, but having more pain and myalgias since falls and diverticultiis episode  Labs/tests ordered:  No new Next appt:  07/10/2016  Suprina Mandeville L. Westly Hinnant, D.O. [facility] [address], [place] [address] Cell Phone (Mon-Fri 8am-5pm):  [phone] On Call:  [phone] & follow prompts after 5pm & weekends Office Phone:  [phone] Office Fax:  [phone]

## 2016-05-09 ENCOUNTER — Emergency Department (HOSPITAL_COMMUNITY): Payer: Medicare Other

## 2016-05-09 ENCOUNTER — Emergency Department (HOSPITAL_COMMUNITY)
Admission: EM | Admit: 2016-05-09 | Discharge: 2016-05-09 | Disposition: A | Discharge status: Home / Self Care | Payer: Medicare Other | Attending: Emergency Medicine | Admitting: Emergency Medicine

## 2016-05-09 ENCOUNTER — Encounter (HOSPITAL_COMMUNITY): Payer: Self-pay | Admitting: Emergency Medicine

## 2016-05-09 DIAGNOSIS — R109 Unspecified abdominal pain: Secondary | ICD-10-CM | POA: Diagnosis not present

## 2016-05-09 DIAGNOSIS — T148XXA Other injury of unspecified body region, initial encounter: Secondary | ICD-10-CM

## 2016-05-09 DIAGNOSIS — Y929 Unspecified place or not applicable: Secondary | ICD-10-CM | POA: Insufficient documentation

## 2016-05-09 DIAGNOSIS — S5002XA Contusion of left elbow, initial encounter: Secondary | ICD-10-CM | POA: Diagnosis not present

## 2016-05-09 DIAGNOSIS — S40022A Contusion of left upper arm, initial encounter: Secondary | ICD-10-CM | POA: Insufficient documentation

## 2016-05-09 DIAGNOSIS — Z79899 Other long term (current) drug therapy: Secondary | ICD-10-CM | POA: Insufficient documentation

## 2016-05-09 DIAGNOSIS — I10 Essential (primary) hypertension: Secondary | ICD-10-CM | POA: Diagnosis not present

## 2016-05-09 DIAGNOSIS — Z87891 Personal history of nicotine dependence: Secondary | ICD-10-CM | POA: Insufficient documentation

## 2016-05-09 DIAGNOSIS — M791 Myalgia, unspecified site: Secondary | ICD-10-CM

## 2016-05-09 DIAGNOSIS — W19XXXA Unspecified fall, initial encounter: Secondary | ICD-10-CM | POA: Insufficient documentation

## 2016-05-09 DIAGNOSIS — Y939 Activity, unspecified: Secondary | ICD-10-CM | POA: Diagnosis not present

## 2016-05-09 DIAGNOSIS — Z791 Long term (current) use of non-steroidal anti-inflammatories (NSAID): Secondary | ICD-10-CM | POA: Insufficient documentation

## 2016-05-09 DIAGNOSIS — Y999 Unspecified external cause status: Secondary | ICD-10-CM | POA: Insufficient documentation

## 2016-05-09 DIAGNOSIS — S20221A Contusion of right back wall of thorax, initial encounter: Secondary | ICD-10-CM | POA: Diagnosis not present

## 2016-05-09 DIAGNOSIS — S20211A Contusion of right front wall of thorax, initial encounter: Secondary | ICD-10-CM | POA: Diagnosis not present

## 2016-05-09 DIAGNOSIS — R1032 Left lower quadrant pain: Secondary | ICD-10-CM | POA: Diagnosis present

## 2016-05-09 DIAGNOSIS — S59902A Unspecified injury of left elbow, initial encounter: Secondary | ICD-10-CM | POA: Diagnosis not present

## 2016-05-09 DIAGNOSIS — M542 Cervicalgia: Secondary | ICD-10-CM | POA: Diagnosis not present

## 2016-05-09 DIAGNOSIS — R0781 Pleurodynia: Secondary | ICD-10-CM | POA: Diagnosis not present

## 2016-05-09 DIAGNOSIS — S299XXA Unspecified injury of thorax, initial encounter: Secondary | ICD-10-CM | POA: Diagnosis not present

## 2016-05-09 DIAGNOSIS — R030 Elevated blood-pressure reading, without diagnosis of hypertension: Secondary | ICD-10-CM | POA: Diagnosis not present

## 2016-05-09 LAB — COMPREHENSIVE METABOLIC PANEL
ALBUMIN: 4.3 g/dL (ref 3.5–5.0)
ALK PHOS: 69 U/L (ref 38–126)
ALT: 16 U/L (ref 14–54)
AST: 19 U/L (ref 15–41)
Anion gap: 10 (ref 5–15)
BUN: 11 mg/dL (ref 6–20)
CALCIUM: 8.8 mg/dL — AB (ref 8.9–10.3)
CHLORIDE: 105 mmol/L (ref 101–111)
CO2: 23 mmol/L (ref 22–32)
CREATININE: 0.77 mg/dL (ref 0.44–1.00)
GFR calc Af Amer: >60 mL/min (ref >60)
GFR calc non Af Amer: >60 mL/min (ref >60)
GLUCOSE: 125 mg/dL — AB (ref 65–99)
Potassium: 3.5 mmol/L (ref 3.5–5.1)
SODIUM: 138 mmol/L (ref 135–145)
Total Bilirubin: 1.1 mg/dL (ref 0.3–1.2)
Total Protein: 7 g/dL (ref 6.5–8.1)

## 2016-05-09 LAB — CBC WITH DIFFERENTIAL/PLATELET
BASOS PCT: 0 %
Basophils Absolute: 0 10*3/uL (ref 0.0–0.1)
EOS ABS: 0.1 10*3/uL (ref 0.0–0.7)
Eosinophils Relative: 1 %
HCT: 41.2 % (ref 36.0–46.0)
Hemoglobin: 14.1 g/dL (ref 12.0–15.0)
LYMPHS ABS: 0.8 10*3/uL (ref 0.7–4.0)
Lymphocytes Relative: 10 %
MCH: 32.1 pg (ref 26.0–34.0)
MCHC: 34.2 g/dL (ref 30.0–36.0)
MCV: 93.8 fL (ref 78.0–100.0)
MONO ABS: 0.7 10*3/uL (ref 0.1–1.0)
MONOS PCT: 8 %
Neutro Abs: 6.4 10*3/uL (ref 1.7–7.7)
Neutrophils Relative %: 81 %
PLATELETS: 217 10*3/uL (ref 150–400)
RBC: 4.39 MIL/uL (ref 3.87–5.11)
RDW: 14.5 % (ref 11.5–15.5)
WBC: 7.9 10*3/uL (ref 4.0–10.5)

## 2016-05-09 LAB — URINALYSIS, ROUTINE W REFLEX MICROSCOPIC
BILIRUBIN URINE: NEGATIVE
GLUCOSE, UA: NEGATIVE mg/dL
Hgb urine dipstick: NEGATIVE
KETONES UR: NEGATIVE mg/dL
LEUKOCYTES UA: NEGATIVE
Nitrite: NEGATIVE
PROTEIN: NEGATIVE mg/dL
Specific Gravity, Urine: 1.011 (ref 1.005–1.030)
pH: 5.5 (ref 5.0–8.0)

## 2016-05-09 MED ORDER — ACETAMINOPHEN 500 MG PO TABS
1000.0000 mg | ORAL_TABLET | Freq: Once | ORAL | Status: AC
  Administered 2016-05-09: 1000 mg via ORAL
  Filled 2016-05-09: qty 2

## 2016-05-09 MED ORDER — ONDANSETRON HCL 4 MG/2ML IJ SOLN
4.0000 mg | Freq: Once | INTRAMUSCULAR | Status: AC
  Administered 2016-05-09: 4 mg via INTRAVENOUS
  Filled 2016-05-09: qty 2

## 2016-05-09 MED ORDER — MORPHINE SULFATE (PF) 4 MG/ML IV SOLN
4.0000 mg | Freq: Once | INTRAVENOUS | Status: AC
  Administered 2016-05-09: 4 mg via INTRAVENOUS
  Filled 2016-05-09: qty 1

## 2016-05-09 MED ORDER — SODIUM CHLORIDE 0.9 % IV BOLUS (SEPSIS)
1000.0000 mL | Freq: Once | INTRAVENOUS | Status: AC
  Administered 2016-05-09: 1000 mL via INTRAVENOUS

## 2016-05-09 MED ORDER — METHOCARBAMOL 1000 MG/10ML IJ SOLN
1000.0000 mg | Freq: Once | INTRAVENOUS | Status: AC
  Administered 2016-05-09: 1000 mg via INTRAVENOUS
  Filled 2016-05-09: qty 10

## 2016-05-09 MED ORDER — METHOCARBAMOL 500 MG PO TABS: 500.0000 mg | ORAL_TABLET | Freq: Three times a day (TID) | ORAL | 0 refills | Status: DC | PRN

## 2016-05-09 MED ORDER — IOPAMIDOL (ISOVUE-300) INJECTION 61%
100.0000 mL | Freq: Once | INTRAVENOUS | Status: AC | PRN
  Administered 2016-05-09: 100 mL via INTRAVENOUS

## 2016-05-09 MED ORDER — HYDROMORPHONE HCL 1 MG/ML IJ SOLN
0.5000 mg | Freq: Once | INTRAMUSCULAR | Status: AC
  Administered 2016-05-09: 0.5 mg via INTRAVENOUS
  Filled 2016-05-09: qty 1

## 2016-05-09 NOTE — ED Notes (Signed)
Bed: WA09 Expected date:  Expected time:  Means of arrival:  Comments: EMS 82 yo F, back pain

## 2016-05-09 NOTE — ED Notes (Signed)
Pt in CT.

## 2016-05-09 NOTE — ED Notes (Signed)
PA at bedside.

## 2016-05-09 NOTE — Discharge Instructions (Signed)
Read the information below.  Use the prescribed medication as directed.  Please discuss all new medications with your pharmacist.  You may return to the Emergency Department at any time for worsening condition or any new symptoms that concern you.    If you develop worsening pain, shortness of breath, fever, you pass out, or become weak or dizzy, return to the ER for a recheck.

## 2016-05-09 NOTE — ED Triage Notes (Addendum)
Pt reports generalized body "muscle spasms" since last night. Had a fall 9 days ago, but no injury since then. No n/v, some diarrhea, but on miralax for constipation. Hypertensive with EMS, but has not had am HTN medication yet

## 2016-05-09 NOTE — ED Notes (Signed)
Patient is alert and oriented x3.  She was given DC instructions with follow up instructions. Patient DC via wheelchair to home V/S stable.  She was not showing any signs of distress on DC 

## 2016-05-09 NOTE — ED Provider Notes (Signed)
[facility] DEPT Provider Note   CSN: [id number] Arrival date & time: 05/09/16  0759     History   Chief Complaint Chief Complaint  Patient presents with  . Generalized Body Aches    HPI [name] is a 82 y.o. female.  HPI   Pt with hx afib on eliquis, frequent falls, HTN, recent treatment for diverticulitis p/w full body muscle spasms that occur with any movement that began overnight.  Has taken 7 days of cipro and 10 days of flagyl, has been taking miralax, and now on probiotic.  Has been having loose and watery stools since diagnosis and has had decreased PO intake.  Has been drinking scotch at night.  Denies any recent trauma or change in activity level.  Denies any fall since 9 days ago.  Denies fevers, CP, SOB, weakness or numbness of the extremities.    Past Medical History:  Diagnosis Date  . A-fib ([place])   . Abdominal pain, unspecified site   . Abnormality of gait 06/02/2013  . Arthritis   . Cataracts, bilateral   . Depression   . Diverticulosis of colon (without mention of hemorrhage)   . Diverticulosis of colon (without mention of hemorrhage) 10/27/2012  . Edema 10/27/2012  . GI bleed 06/2002  . Hemorrhage of gastrointestinal tract, unspecified   . History of stomach ulcers   . Hypertension   . Insomnia, unspecified 10/27/2012  . Localized osteoarthrosis not specified whether primary or secondary, lower leg 05/31/2011  . Migraines   . Nausea alone 10/28/2012  . Obesity, unspecified 10/27/2012  . Osteoarthrosis, unspecified whether generalized or localized, lower leg   . Other acariasis    peripherial neuropathy  . Other and unspecified alcohol dependence, continuous drinking behavior 10/28/2012  . Palpitations   . PVC (premature ventricular contraction)   . Unspecified hereditary and idiopathic peripheral neuropathy   . UTI (lower urinary tract infection)    pt state uti w/ no pain    Patient Active Problem List   Diagnosis Date Noted  . Acute upper  respiratory infections 12/06/2013  . Pruritus 11/08/2013  . Hx of peptic ulcer 06/13/2013  . Hyperglycemia 06/07/2013  . DJD (degenerative joint disease) 06/07/2013  . Hypokalemia 06/06/2013  . Abnormality of gait 06/02/2013  . UTI (urinary tract infection) 05/21/2013  . Unintentional weight loss 03/29/2013  . Cough 03/29/2013  . Tremor 01/11/2013  . Nausea alone 01/11/2013  . Depression   . Alcohol dependence, continuous ([place]) 10/28/2012  . Insomnia 10/27/2012  . A-fib ([place])   . PVC (premature ventricular contraction)   . Hypertension 10/08/2012    Past Surgical History:  Procedure Laterality Date  . ARTHROSCOPIC REPAIR ACL    . BREAST BIOPSY  1973   left  . BREAST REDUCTION SURGERY  12/23/2003  . cataracts    . CHOLECYSTECTOMY  2005  . TONSILLECTOMY AND ADENOIDECTOMY  1941    OB History    No data available       Home Medications    Prior to Admission medications   Medication Sig Start Date End Date Taking? Authorizing Provider  acetaminophen (TYLENOL) 325 MG tablet Take 650 mg by mouth every 4 (four) hours as needed. For pain   Yes Historical Provider, MD  acidophilus (RISAQUAD) CAPS capsule Take 1 capsule by mouth daily.   Yes Historical Provider, MD  ELIQUIS 5 MG TABS tablet TAKE 1 TABLET BY MOUTH TWICE DAILY Patient taking differently: TAKE 5 MG BY MOUTH TWICE DAILY 04/12/16  Yes  Tiffany L Reed, DO  flecainide (TAMBOCOR) 50 MG tablet TAKE 1 TABLET BY MOUTH TWICE DAILY FOR ATRIAL FIBRILLATION Patient taking differently: TAKE 50 MG BY MOUTH TWICE DAILY FOR ATRIAL FIBRILLATION 05/06/16  Yes Tiffany L Reed, DO  HYDROcodone-acetaminophen (NORCO/VICODIN) 5-325 MG tablet Take 1-2 tablets by mouth every 4 (four) hours as needed for moderate pain.   Yes Historical Provider, MD  losartan (COZAAR) 100 MG tablet TAKE 1 TABLET BY MOUTH DAILY Patient taking differently: TAKE 100 MG BY MOUTH DAILY 05/06/16  Yes Tiffany L Reed, DO  metoprolol succinate (TOPROL XL) 50 MG 24 hr  tablet Take 1 tablet (50 mg total) by mouth daily. Take with or immediately following a meal. 06/23/15  Yes Peter M Martinique, MD  ondansetron (ZOFRAN) 4 MG tablet Take 1 tablet (4 mg total) by mouth daily as needed for nausea. 04/10/16  Yes Tiffany L Reed, DO  Phenylephrine HCl (NEO-SYNEPHRINE NA) Place 1 spray into the nose 2 (two) times daily as needed (congestion).   Yes Historical Provider, MD  sodium chloride (OCEAN) 0.65 % SOLN nasal spray Place 1 spray into both nostrils daily as needed for congestion.   Yes Historical Provider, MD  zolpidem (AMBIEN) 10 MG tablet TAKE 1 TABLET BY MOUTH AT BEDTIME AS NEEDED FOR INSOMNIA Patient taking differently: TAKE 10 MG BY MOUTH AT BEDTIME AS NEEDED FOR INSOMNIA 04/29/16  Yes Tiffany L Reed, DO  methocarbamol (ROBAXIN) 500 MG tablet Take 1-2 tablets (500-1,000 mg total) by mouth 3 (three) times daily as needed for muscle spasms (and pain). 05/09/16   Clayton Bibles, PA-C    Family History History reviewed. No pertinent family history.  Social History Social History  Substance Use Topics  . Smoking status: Former Smoker    Types: Cigarettes    Quit date: 10/09/1983  . Smokeless tobacco: Never Used  . Alcohol use 0.0 oz/week    1 - 2 Shots of liquor per week     Comment: daily  scotch      Allergies   Mirtazapine; Nickel; Other; and Prednisone   Review of Systems Review of Systems  All other systems reviewed and are negative.    Physical Exam Updated Vital Signs BP 181/82   Pulse 83   Temp 99 F (37.2 C) (Rectal)   Resp 17   SpO2 94%   Physical Exam  Constitutional: She appears well-developed and well-nourished. No distress.  HENT:  Head: Normocephalic and atraumatic.  Neck: Neck supple.  Cardiovascular: Normal rate and regular rhythm.   Pulmonary/Chest: Effort normal and breath sounds normal. No respiratory distress. She has no wheezes. She has no rales.  Abdominal: Soft. She exhibits no distension. There is tenderness in the left  lower quadrant. There is no rebound and no guarding.  Musculoskeletal:  Occasional generalized tenseness and ?muscle spasm with movement, decreasing initial ability to examine and move patient.  Ecchymosis over right posterior ribs, tender to palpation.    Neurological: She is alert.  Skin: She is not diaphoretic.  Multiple bruises in various stages of healing - large bruise on left upper arm, also left elbow.    Nursing note and vitals reviewed.    ED Treatments / Results  Labs (all labs ordered are listed, but only abnormal results are displayed) Labs Reviewed  COMPREHENSIVE METABOLIC PANEL - Abnormal; Notable for the following:       Result Value   Glucose, Bld 125 (*)    Calcium 8.8 (*)    All other components within  normal limits  CBC WITH DIFFERENTIAL/PLATELET  URINALYSIS, ROUTINE W REFLEX MICROSCOPIC (NOT AT [facility])    EKG  EKG Interpretation None       Radiology Dg Ribs Unilateral W/chest Right  Result Date: 05/09/2016 CLINICAL DATA:  Initial evaluation for acute trauma, recent fall. Now with acute posterior right rib pain. EXAM: RIGHT RIBS AND CHEST - 3+ VIEW COMPARISON:  Prior radiograph from 12/04/2015. FINDINGS: Cardiac silhouette is enlarged and stable in contour relative to previous exam. Mediastinal silhouette within normal limits. Atheromatous plaque noted within the aortic arch. Lungs are normally inflated. Chronic scarring noted at the left lung base, similar to prior. No focal infiltrate or pulmonary edema. No pleural effusion. No pneumothorax. Metallic BB overlies the lower posterior right ribs. No acute displaced rib fracture identified, although evaluation somewhat limited by body habitus. IMPRESSION: 1. No acute displaced right-sided rib fracture identified. 2. No active cardiopulmonary disease. 3. Mild scarring at the left lung base, stable. 4. Cardiomegaly with aortic atherosclerosis, stable. Electronically Signed   By: Jeannine Boga M.D.   On:  05/09/2016 13:14   Dg Elbow Complete Left  Result Date: 05/09/2016 CLINICAL DATA:  Initial evaluation for acute trauma, fall. EXAM: LEFT ELBOW - COMPLETE 3+ VIEW COMPARISON:  None. FINDINGS: No acute fracture or dislocation. No joint effusion. Mild degenerative spurring at the olecranon. No other significant degenerative changes. Osseous mineralization within normal limits. No acute soft tissue abnormality. IMPRESSION: No acute osseous abnormality about the elbow. Electronically Signed   By: Jeannine Boga M.D.   On: 05/09/2016 13:16   Ct Abdomen Pelvis W Contrast  Result Date: 05/09/2016 CLINICAL DATA:  Abdominal pain EXAM: CT ABDOMEN AND PELVIS WITH CONTRAST TECHNIQUE: Multidetector CT imaging of the abdomen and pelvis was performed using the standard protocol following bolus administration of intravenous contrast. CONTRAST:  174mL ISOVUE-300 IOPAMIDOL (ISOVUE-300) INJECTION 61% COMPARISON:  04/27/2016 FINDINGS: Lower chest:  The lung bases are unremarkable. Hepatobiliary: Enhanced liver the shows minimal intrahepatic biliary ductal dilatation no focal hepatic mass. The patient is status postcholecystectomy. Pancreas: Enhanced pancreas is unremarkable. Spleen: Enhanced spleen is unremarkable. Adrenals/Urinary Tract: No adrenal gland mass. Kidneys are symmetrical in size and enhancement. Mild lobulated renal contour. No hydronephrosis or hydroureter. No calcified ureteral calculi. The urinary bladder is unremarkable. Delayed renal images shows bilateral renal symmetrical excretion. Bilateral visualized proximal ureter is unremarkable. Stomach/Bowel: Study is limited without oral contrast. There is no small bowel obstruction. No thickened or dilated small bowel loops. Moderate stool noted within right colon and cecum. No pericecal inflammation. There is a low lying cecum. The terminal ileum is unremarkable. Some colonic gas and stool noted in transverse colon. Some colonic gas noted in distal left  colon and proximal sigmoid colon. Redundant sigmoid colon. Moderate gas noted mid sigmoid colon. No distal colonic obstruction. No acute colitis or diverticulitis. Vascular/Lymphatic: No aortic aneurysm. Atherosclerotic calcifications of abdominal aorta and iliac arteries are noted. No retroperitoneal or mesenteric adenopathy. Reproductive: The uterus is atrophic. No adnexal mass. No pelvic free fluid. Other: There is no ascites or free abdominal air. Normal appendix partially visualized in coronal image 86. No inguinal adenopathy. Musculoskeletal: Sagittal images of the spine shows degenerative changes thoracolumbar spine. Mild degenerative changes bilateral SI joints. IMPRESSION: 1. Status post cholecystectomy. 2. Moderate stool noted within right colon and cecum. No pericecal inflammation. Low lying cecum. Normal appendix. 3. No small bowel obstruction. 4. Redundant sigmoid colon. Moderate gas noted within mid sigmoid colon. No evidence of colitis or diverticulitis. 5. No  hydronephrosis or hydroureter. No nephrolithiasis. No calcified ureteral calculi. 6. Degenerative changes thoracic spine. Electronically Signed   By: Lahoma Crocker M.D.   On: 05/09/2016 11:48    Procedures Procedures (including critical care time)  Medications Ordered in ED Medications  morphine 4 MG/ML injection 4 mg (4 mg Intravenous Given 05/09/16 0923)  ondansetron (ZOFRAN) injection 4 mg (4 mg Intravenous Given 05/09/16 0923)  sodium chloride 0.9 % bolus 1,000 mL (0 mLs Intravenous Stopped 05/09/16 1034)  acetaminophen (TYLENOL) tablet 1,000 mg (1,000 mg Oral Given 05/09/16 1035)  HYDROmorphone (DILAUDID) injection 0.5 mg (0.5 mg Intravenous Given 05/09/16 1035)  iopamidol (ISOVUE-300) 61 % injection 100 mL (100 mLs Intravenous Contrast Given 05/09/16 1056)  methocarbamol (ROBAXIN) 1,000 mg in dextrose 5 % 50 mL IVPB (0 mg Intravenous Stopped 05/09/16 1434)     Initial Impression / Assessment and Plan / ED Course  I have reviewed  the triage vital signs and the nursing notes.  Pertinent labs & imaging results that were available during my care of the patient were reviewed by me and considered in my medical decision making (see chart for details).  Clinical Course  Comment By Time  Patient currently feeling much better, pain is relieved.  Shaking has resolved.  Eager to be discharged home.  Daughter has arranged for 24/7 care.  Daughter called and found pt had fallen last night and pressed her security alert button - this was after she had taken her Lorrin Mais and has no memory of it.   Clayton Bibles, PA-C 08/17 1401    Afebrile, nontoxic patient with c/o muscle spasms throughout her torso, worse with any movement. Pt also seen and examined by Dr Venora Maples.  Initially pt denied any trauma though over time and after finding bruising over her right thoracic back and investigation by her daughter, it turns out patient did fall last night while on Ambien.  Discussed my safety concerns with her taking Ambien.  Daughter has arranged for patient to have around the clock care in her home (independent living).  CT abd/pelvis shows resolved diverticulitis with moderate right sided stool burden.  Pt has recently started probiotics, has bowel regimen with miralax at home though she has had diarrhea recently, Pt feeling much better after treatment in ED.  Workup otherwise unremarkable.  No headache, no neurologic complaints.  Doubt head injury.  D/C home with close PCP follow up, robaxin.  Discussed result, findings, treatment, and follow up  with patient.  Pt given return precautions.  Pt verbalizes understanding and agrees with plan.       Final Clinical Impressions(s) / ED Diagnoses   Final diagnoses:  Fall, initial encounter  Contusion  Myalgia    New Prescriptions Discharge Medication List as of 05/09/2016  2:06 PM       Clayton Bibles, PA-C 05/09/16 [address], PA-C 05/09/16 [place], MD 05/09/16 [phone]

## 2016-05-10 ENCOUNTER — Non-Acute Institutional Stay (SKILLED_NURSING_FACILITY): Payer: Medicare Other | Admitting: Adult Health

## 2016-05-10 ENCOUNTER — Encounter: Payer: Self-pay | Admitting: Adult Health

## 2016-05-10 DIAGNOSIS — W19XXXA Unspecified fall, initial encounter: Secondary | ICD-10-CM | POA: Diagnosis not present

## 2016-05-10 DIAGNOSIS — G4700 Insomnia, unspecified: Secondary | ICD-10-CM | POA: Diagnosis not present

## 2016-05-10 DIAGNOSIS — B370 Candidal stomatitis: Secondary | ICD-10-CM

## 2016-05-10 DIAGNOSIS — I10 Essential (primary) hypertension: Secondary | ICD-10-CM | POA: Diagnosis not present

## 2016-05-10 DIAGNOSIS — T148 Other injury of unspecified body region: Secondary | ICD-10-CM

## 2016-05-10 DIAGNOSIS — F1020 Alcohol dependence, uncomplicated: Secondary | ICD-10-CM | POA: Diagnosis not present

## 2016-05-10 DIAGNOSIS — M170 Bilateral primary osteoarthritis of knee: Secondary | ICD-10-CM

## 2016-05-10 DIAGNOSIS — I480 Paroxysmal atrial fibrillation: Secondary | ICD-10-CM | POA: Diagnosis not present

## 2016-05-10 DIAGNOSIS — T148XXA Other injury of unspecified body region, initial encounter: Secondary | ICD-10-CM

## 2016-05-10 NOTE — Progress Notes (Signed)
Patient ID: [name], female   DOB: 01/03/1933, 82 y.o.   MRN: [id number]  Location:   [facility]   Place of Service:  SNF (31) Provider:   Cindi Carbon, ANP [facility] [phone]   Hollace Kinnier, DO  Patient Care Team: Gayland Curry, DO as PCP - General (Geriatric Medicine) Peter M Martinique, MD as Attending Physician (Cardiology) Well Coleman, MD as Consulting Physician (Neurology)  Extended Emergency Contact Information Primary Emergency Contact: Darden [place] Phone: [phone] Mobile Phone: [phone] Relation: Relative Secondary Emergency Contact: [place] Work Phone: [phone] Mobile Phone: [phone] Relation: None  Code Status:  DNR Goals of care: Advanced Directive information Advanced Directives 05/09/2016  Does patient have an advance directive? Yes  Type of Advance Directive Out of facility DNR (pink MOST or yellow form)  Does patient want to make changes to advanced directive? No - Patient declined  Copy of advanced directive(s) in chart? Yes  Pre-existing out of facility DNR order (yellow form or pink MOST form) Yellow form placed in chart (order not valid for inpatient use)     Chief Complaint  Patient presents with  . Acute Visit    side pain and weakness    HPI:  Pt is a 82 y.o. female seen today for evaluation to rehab at [facility] after a visit to the ED on 05/08/16 for pain noted to the right side.  She reports that she fell prior to the visit and had bruising to the right flank and left elbow.  She has felt muscle spasms to the torso area that are gripping in nature and come and go with movement.  Left elbow xray was negative.  Chest and right side xray were negative as well. She was seen in the ED on 04/27/16 and diagnosed with Diverticulitis and given Cipro and flagyl. F/U CT done during this admission showed resolution of  diverticulitis and only degenerative change of the lumbo sacral spine.  Also given Miralax for constipation. She reports loose stools for the past week but none for the past two days, no pain. Has been generally weak and requiring more assistance.  Now has 24/7 care givers due to fall risk.  Reports drinking scotch during the course of the flagyl and experiencing nausea and weakness with this.  She did not know that there was an interaction between flaygl and alcohol.  She has a hx of insomnia and has tried multiple agents by her account. Her goal is to be comfortable and sleep well at night, despite the side effects and risk of Ambien.   Report OA to both knees that given when she ambulates.   Past Medical History:  Diagnosis Date  . A-fib ([place])   . Abdominal pain, unspecified site   . Abnormality of gait 06/02/2013  . Arthritis   . Cataracts, bilateral   . Depression   . Diverticulosis of colon (without mention of hemorrhage)   . Diverticulosis of colon (without mention of hemorrhage) 10/27/2012  . Edema 10/27/2012  . GI bleed 06/2002  . Hemorrhage of gastrointestinal tract, unspecified   . History of stomach ulcers   . Hypertension   . Insomnia, unspecified 10/27/2012  . Localized osteoarthrosis not specified whether primary or secondary, lower leg 05/31/2011  . Migraines   . Nausea alone 10/28/2012  . Obesity, unspecified 10/27/2012  . Osteoarthrosis, unspecified whether generalized or localized, lower leg   . Other acariasis  peripherial neuropathy  . Other and unspecified alcohol dependence, continuous drinking behavior 10/28/2012  . Palpitations   . PVC (premature ventricular contraction)   . Unspecified hereditary and idiopathic peripheral neuropathy   . UTI (lower urinary tract infection)    pt state uti w/ no pain   Past Surgical History:  Procedure Laterality Date  . ARTHROSCOPIC REPAIR ACL    . BREAST BIOPSY  1973   left  . BREAST REDUCTION SURGERY  12/23/2003  . cataracts    .  CHOLECYSTECTOMY  2005  . TONSILLECTOMY AND ADENOIDECTOMY  1941    Allergies  Allergen Reactions  . Mirtazapine     tremor  . Nickel Other (See Comments)    inflammation  . Other Itching    Acrylic nails  . Prednisone     Causes sleep disturbances       Medication List       Accurate as of 05/10/16 11:20 AM. Always use your most recent med list.          acetaminophen 325 MG tablet Commonly known as:  TYLENOL Take 650 mg by mouth every 4 (four) hours as needed. For pain   acidophilus Caps capsule Take 1 capsule by mouth daily.   ELIQUIS 5 MG Tabs tablet Generic drug:  apixaban TAKE 1 TABLET BY MOUTH TWICE DAILY   flecainide 50 MG tablet Commonly known as:  TAMBOCOR TAKE 1 TABLET BY MOUTH TWICE DAILY FOR ATRIAL FIBRILLATION   HYDROcodone-acetaminophen 5-325 MG tablet Commonly known as:  NORCO/VICODIN Take 1-2 tablets by mouth every 4 (four) hours as needed for moderate pain.   losartan 100 MG tablet Commonly known as:  COZAAR TAKE 1 TABLET BY MOUTH DAILY   methocarbamol 500 MG tablet Commonly known as:  ROBAXIN Take 1-2 tablets (500-1,000 mg total) by mouth 3 (three) times daily as needed for muscle spasms (and pain).   metoprolol succinate 50 MG 24 hr tablet Commonly known as:  TOPROL XL Take 1 tablet (50 mg total) by mouth daily. Take with or immediately following a meal.   NEO-SYNEPHRINE NA Place 1 spray into the nose 2 (two) times daily as needed (congestion).   ondansetron 4 MG tablet Commonly known as:  ZOFRAN Take 1 tablet (4 mg total) by mouth daily as needed for nausea.   sodium chloride 0.65 % Soln nasal spray Commonly known as:  OCEAN Place 1 spray into both nostrils daily as needed for congestion.   zolpidem 10 MG tablet Commonly known as:  AMBIEN TAKE 1 TABLET BY MOUTH AT BEDTIME AS NEEDED FOR INSOMNIA       Review of Systems  Constitutional: Negative for activity change, appetite change, chills, diaphoresis, fatigue, fever and  unexpected weight change.  HENT: Negative for congestion.   Eyes: Negative for visual disturbance.  Respiratory: Negative for cough, shortness of breath and wheezing.   Cardiovascular: Negative for chest pain, palpitations and leg swelling.  Gastrointestinal: Negative for abdominal distention, abdominal pain, constipation and diarrhea.  Genitourinary: Negative for difficulty urinating and dysuria.  Musculoskeletal: Positive for arthralgias, back pain and gait problem. Negative for joint swelling, myalgias and neck pain.  Skin: Positive for color change.  Neurological: Negative for dizziness, tremors, seizures, syncope, facial asymmetry, speech difficulty, weakness, light-headedness, numbness and headaches.  Psychiatric/Behavioral: Positive for sleep disturbance. Negative for agitation, behavioral problems and confusion.    Immunization History  Administered Date(s) Administered  . Influenza Split 07/23/2013  . Influenza Whole 06/23/2012  . Influenza,inj,Quad PF,36+ Mos 06/23/2015  .  Influenza-Unspecified 07/11/2014  . Pneumococcal Conjugate-13 10/04/2015  . Pneumococcal Polysaccharide-23 09/23/2010  . Td 09/23/2010  . Zoster 09/23/1998   Pertinent  Health Maintenance Due  Topic Date Due  . DEXA SCAN  05/08/2000  . INFLUENZA VACCINE  04/23/2016  . PNA vac Low Risk Adult  Completed   Fall Risk  05/08/2016 04/10/2016 12/13/2015 10/04/2015 10/04/2015  Falls in the past year? Yes Yes Yes - Yes  Number falls in past yr: 1 2 or more 2 or more - 2 or more  Injury with Fall? Yes Yes Yes - No  Risk for fall due to : - - - History of fall(s);Impaired balance/gait;Impaired mobility History of fall(s);Impaired mobility  Follow up - - - Falls evaluation completed;Education provided;Falls prevention discussed -   Functional Status Survey:    Vitals:   05/10/16 1118  BP: (!) 152/84  Pulse: 80  Resp: 19  Temp: 98.8 F (37.1 C)  SpO2: 93%  Weight: 193 lb (87.5 kg)   Body mass index is  29.35 kg/m. Physical Exam  Constitutional: She is oriented to person, place, and time. No distress.  HENT:  Head: Normocephalic and atraumatic.  Right Ear: External ear normal.  Left Ear: External ear normal.  Nose: Nose normal.  Mouth/Throat: No oropharyngeal exudate.  Thrush noted to tongue  Eyes: Conjunctivae and EOM are normal. Pupils are equal, round, and reactive to light. Right eye exhibits no discharge. Left eye exhibits no discharge.  Neck: Normal range of motion. Neck supple. No JVD present.  Cardiovascular: Normal rate and regular rhythm.   No murmur heard. Pulmonary/Chest: Effort normal and breath sounds normal. No respiratory distress. She has no wheezes.  Abdominal: Soft. Bowel sounds are normal. She exhibits no distension. There is no tenderness.  Musculoskeletal: She exhibits tenderness. She exhibits no edema.  Decrease ROM to the spine. Pain elicited to the right side when leg strength tested. 4/5 to BUE and BLE.  Crepitus and swelling to both knees, R>L  Neurological: She is alert and oriented to person, place, and time. No cranial nerve deficit.  Skin: Skin is warm and dry. She is not diaphoretic.  Ecchymoses to the Left occiput area, left elbow, and right flank  Psychiatric: She has a normal mood and affect.  Nursing note and vitals reviewed.   Labs reviewed:  Recent Labs  12/04/15 1619 04/27/16 1256 05/09/16 0926  NA 136 140 138  K 4.0 3.9 3.5  CL 101 106 105  CO2 24 24 23   GLUCOSE 130* 95 125*  BUN 12 17 11   CREATININE 0.76 0.76 0.77  CALCIUM 9.0 9.0 8.8*    Recent Labs  10/03/15 04/27/16 1256 05/09/16 0926  AST 16 15 19   ALT 10 13* 16  ALKPHOS 64 89 69  BILITOT  --  1.0 1.1  PROT  --  7.0 7.0  ALBUMIN  --  4.4 4.3    Recent Labs  12/04/15 1619 04/27/16 1256 05/09/16 0926  WBC 6.1 6.7 7.9  NEUTROABS 4.6 4.5 6.4  HGB 14.1 14.3 14.1  HCT 41.5 43.1 41.2  MCV 94.1 95.4 93.8  PLT 218 215 217   Lab Results  Component Value Date    TSH 1.215 06/05/2013   Lab Results  Component Value Date   HGBA1C 5.3 10/03/2015   Lab Results  Component Value Date   CHOL 171 10/03/2015   HDL 63 10/03/2015   LDLCALC 81 10/03/2015   TRIG 136 10/03/2015    Significant Diagnostic Results in last  30 days:  Dg Ribs Unilateral W/chest Right  Result Date: 05/09/2016 CLINICAL DATA:  Initial evaluation for acute trauma, recent fall. Now with acute posterior right rib pain. EXAM: RIGHT RIBS AND CHEST - 3+ VIEW COMPARISON:  Prior radiograph from 12/04/2015. FINDINGS: Cardiac silhouette is enlarged and stable in contour relative to previous exam. Mediastinal silhouette within normal limits. Atheromatous plaque noted within the aortic arch. Lungs are normally inflated. Chronic scarring noted at the left lung base, similar to prior. No focal infiltrate or pulmonary edema. No pleural effusion. No pneumothorax. Metallic BB overlies the lower posterior right ribs. No acute displaced rib fracture identified, although evaluation somewhat limited by body habitus. IMPRESSION: 1. No acute displaced right-sided rib fracture identified. 2. No active cardiopulmonary disease. 3. Mild scarring at the left lung base, stable. 4. Cardiomegaly with aortic atherosclerosis, stable. Electronically Signed   By: Jeannine Boga M.D.   On: 05/09/2016 13:14   Dg Elbow Complete Left  Result Date: 05/09/2016 CLINICAL DATA:  Initial evaluation for acute trauma, fall. EXAM: LEFT ELBOW - COMPLETE 3+ VIEW COMPARISON:  None. FINDINGS: No acute fracture or dislocation. No joint effusion. Mild degenerative spurring at the olecranon. No other significant degenerative changes. Osseous mineralization within normal limits. No acute soft tissue abnormality. IMPRESSION: No acute osseous abnormality about the elbow. Electronically Signed   By: Jeannine Boga M.D.   On: 05/09/2016 13:16   Ct Abdomen Pelvis W Contrast  Result Date: 05/09/2016 CLINICAL DATA:  Abdominal pain EXAM: CT  ABDOMEN AND PELVIS WITH CONTRAST TECHNIQUE: Multidetector CT imaging of the abdomen and pelvis was performed using the standard protocol following bolus administration of intravenous contrast. CONTRAST:  161mL ISOVUE-300 IOPAMIDOL (ISOVUE-300) INJECTION 61% COMPARISON:  04/27/2016 FINDINGS: Lower chest:  The lung bases are unremarkable. Hepatobiliary: Enhanced liver the shows minimal intrahepatic biliary ductal dilatation no focal hepatic mass. The patient is status postcholecystectomy. Pancreas: Enhanced pancreas is unremarkable. Spleen: Enhanced spleen is unremarkable. Adrenals/Urinary Tract: No adrenal gland mass. Kidneys are symmetrical in size and enhancement. Mild lobulated renal contour. No hydronephrosis or hydroureter. No calcified ureteral calculi. The urinary bladder is unremarkable. Delayed renal images shows bilateral renal symmetrical excretion. Bilateral visualized proximal ureter is unremarkable. Stomach/Bowel: Study is limited without oral contrast. There is no small bowel obstruction. No thickened or dilated small bowel loops. Moderate stool noted within right colon and cecum. No pericecal inflammation. There is a low lying cecum. The terminal ileum is unremarkable. Some colonic gas and stool noted in transverse colon. Some colonic gas noted in distal left colon and proximal sigmoid colon. Redundant sigmoid colon. Moderate gas noted mid sigmoid colon. No distal colonic obstruction. No acute colitis or diverticulitis. Vascular/Lymphatic: No aortic aneurysm. Atherosclerotic calcifications of abdominal aorta and iliac arteries are noted. No retroperitoneal or mesenteric adenopathy. Reproductive: The uterus is atrophic. No adnexal mass. No pelvic free fluid. Other: There is no ascites or free abdominal air. Normal appendix partially visualized in coronal image 86. No inguinal adenopathy. Musculoskeletal: Sagittal images of the spine shows degenerative changes thoracolumbar spine. Mild degenerative  changes bilateral SI joints. IMPRESSION: 1. Status post cholecystectomy. 2. Moderate stool noted within right colon and cecum. No pericecal inflammation. Low lying cecum. Normal appendix. 3. No small bowel obstruction. 4. Redundant sigmoid colon. Moderate gas noted within mid sigmoid colon. No evidence of colitis or diverticulitis. 5. No hydronephrosis or hydroureter. No nephrolithiasis. No calcified ureteral calculi. 6. Degenerative changes thoracic spine. Electronically Signed   By: Lahoma Crocker M.D.   On: 05/09/2016  11:48    Assessment/Plan  1. Contusion -with muscle spasms noted to right flank/torso area -reports norco only helped a minimally so would schedule it TID and continue prn dosing q 8 hrs -d/c robaxin as it is not working and begin flexeril 5 mg TID  -only OOB with assistance, may use bed/chair alarm  2. Falls, initial encounter -multifactorial: knees buckle during ambulation, polypharmacy, alcohol use -use walker at all time and consult PT/OT  3. Thrush, oral -nystatin 5 ml QID for 7 days  4. Insomnia -she declined discontinuing the [place], she is aware of the risk and says that in her last years she wants to be able to sleep and hopes that by having 24/7 caregivers she can prevent falls in the future  5. Primary osteoarthritis of both knees -noted, uses walker   6. Alcohol dependence, continuous ([place]) -avoid etoh while on sedating meds -should avoid etoh when on flagyl  7. Essential hypertension -elevated at times due to pain -continue current regimen and if this continues once pain is controlled will address  8. Paroxysmal atrial fibrillation (HCC) Rate controlled Remains on eliquis for CVA risk reduction (may consider discontinuing if falls persist)   Family/ staff Communication: discussed with resident and staff  Labs/tests ordered:  Ordered in the ED and were WNL

## 2016-05-14 ENCOUNTER — Encounter: Payer: Self-pay | Admitting: Internal Medicine

## 2016-05-14 ENCOUNTER — Non-Acute Institutional Stay (SKILLED_NURSING_FACILITY): Payer: Medicare Other | Admitting: Internal Medicine

## 2016-05-14 DIAGNOSIS — R269 Unspecified abnormalities of gait and mobility: Secondary | ICD-10-CM | POA: Diagnosis not present

## 2016-05-14 DIAGNOSIS — R110 Nausea: Secondary | ICD-10-CM

## 2016-05-14 DIAGNOSIS — M62561 Muscle wasting and atrophy, not elsewhere classified, right lower leg: Secondary | ICD-10-CM | POA: Diagnosis not present

## 2016-05-14 DIAGNOSIS — I480 Paroxysmal atrial fibrillation: Secondary | ICD-10-CM

## 2016-05-14 DIAGNOSIS — M25522 Pain in left elbow: Secondary | ICD-10-CM | POA: Diagnosis not present

## 2016-05-14 DIAGNOSIS — G4700 Insomnia, unspecified: Secondary | ICD-10-CM

## 2016-05-14 DIAGNOSIS — M62830 Muscle spasm of back: Secondary | ICD-10-CM | POA: Diagnosis not present

## 2016-05-14 DIAGNOSIS — I10 Essential (primary) hypertension: Secondary | ICD-10-CM | POA: Diagnosis not present

## 2016-05-14 DIAGNOSIS — M62562 Muscle wasting and atrophy, not elsewhere classified, left lower leg: Secondary | ICD-10-CM | POA: Diagnosis not present

## 2016-05-14 DIAGNOSIS — R2681 Unsteadiness on feet: Secondary | ICD-10-CM | POA: Diagnosis not present

## 2016-05-14 DIAGNOSIS — F1020 Alcohol dependence, uncomplicated: Secondary | ICD-10-CM | POA: Diagnosis not present

## 2016-05-14 DIAGNOSIS — M170 Bilateral primary osteoarthritis of knee: Secondary | ICD-10-CM | POA: Diagnosis not present

## 2016-05-14 DIAGNOSIS — Z9181 History of falling: Secondary | ICD-10-CM | POA: Diagnosis not present

## 2016-05-14 DIAGNOSIS — R2689 Other abnormalities of gait and mobility: Secondary | ICD-10-CM | POA: Diagnosis not present

## 2016-05-14 DIAGNOSIS — M5489 Other dorsalgia: Secondary | ICD-10-CM | POA: Diagnosis not present

## 2016-05-14 DIAGNOSIS — M6259 Muscle wasting and atrophy, not elsewhere classified, multiple sites: Secondary | ICD-10-CM | POA: Diagnosis not present

## 2016-05-14 DIAGNOSIS — M546 Pain in thoracic spine: Secondary | ICD-10-CM | POA: Diagnosis not present

## 2016-05-14 DIAGNOSIS — R278 Other lack of coordination: Secondary | ICD-10-CM | POA: Diagnosis not present

## 2016-05-14 NOTE — Progress Notes (Signed)
Provider:  Rexene Edison. Mariea Clonts, D.O., C.M.D. Location:   [facility] Room Number: 146 rehab  Place of Service:  SNF (31)  PCP: Hollace Kinnier, DO Patient Care Team: Gayland Curry, DO as PCP - General (Geriatric Medicine) Peter M Martinique, MD as Attending Physician (Cardiology) Well Thomson, MD as Consulting Physician (Neurology)  Extended Emergency Contact Information Primary Emergency Contact: Darden [place] Phone: [phone] Mobile Phone: [phone] Relation: Relative Secondary Emergency Contact: [place] Work Phone: [phone] Mobile Phone: [phone] Relation: None  Code Status: DNR Goals of Care: Advanced Directive information Advanced Directives 05/14/2016  Does patient have an advance directive? Yes  Type of Advance Directive Out of facility DNR (pink MOST or yellow form);Living will;Healthcare Power of Attorney  Does patient want to make changes to advanced directive? -  Copy of advanced directive(s) in chart? Yes  Pre-existing out of facility DNR order (yellow form or pink MOST form) Yellow form placed in chart (order not valid for inpatient use)      Chief Complaint  Patient presents with  . New Admit To SNF    rehab admission    HPI: Patient is a 82 y.o. female seen today for admission to [facility] s/p ED visit with increased pain.    She has been falling more frequently at home.  She continues to take [place] and drink alcohol in the evenings.  She is adamant about continuing the [place].  She has not been drinking since arrival here.  She plans to resume when she returns home.  Her daughter is arranging for her to have 24 hr supervision now.    Pt was admitted for rehab s/p ED visit with increased pain.    She fell 8/10 in the kitchen on her floor rug.  Said her legs gave out.  Her dinner was in a bag on the floor and a bottle of club soda was  spilled on the counter of her island.  She appeared intoxicated.  She had a 2 inch skin tear lon her right forearm which was cleansed with NS, 10 steristrips were applied and then it was wrapped with gauze and taped.  She denied pain at that time.  Her bp was 182/100 and pulse 60.    Her dressing was changed 8/11.  RN was called again 8/12 and she was found sitting on the floor in the hallway with her back against the wall, legs outstretched.  She was clearly inebriated (per nursing note).  She said her "knees buckled" and she had collapsed.  She was assisted to bed.    8/14, had right forearm dressing changed.  Then she was seen by me 8/15 in the clinic and had the bruising under her arm but did not c/o much more.    8/16, she fell again on her left side on the floor.  She admitted to drinking alcohol.  8/17, she was shaking and c/o extreme pain and all over back spasms.  Her step-daughter was present and she was sent to the ED again.  She got morphine 4mg , hydromorphone 0.5mg , zofran 4mg , tylenol 1000mg , lopamidol 100mg , and robaxin 1000mg .    8/18, she was admitted to rehab.  She had a bunch of different hydrocodone orders from the ED and these were streamlined.  She is not drinking in rehab and claims she is not withdrawing and can go without alcohol.  Past Medical History:  Diagnosis Date  .  A-fib ([place])   . Abdominal pain, unspecified site   . Abnormality of gait 06/02/2013  . Arthritis   . Cataracts, bilateral   . Depression   . Diverticulosis of colon (without mention of hemorrhage)   . Diverticulosis of colon (without mention of hemorrhage) 10/27/2012  . Edema 10/27/2012  . GI bleed 06/2002  . Hemorrhage of gastrointestinal tract, unspecified   . History of stomach ulcers   . Hypertension   . Insomnia, unspecified 10/27/2012  . Localized osteoarthrosis not specified whether primary or secondary, lower leg 05/31/2011  . Migraines   . Nausea alone 10/28/2012  . Obesity, unspecified 10/27/2012   . Osteoarthrosis, unspecified whether generalized or localized, lower leg   . Other acariasis    peripherial neuropathy  . Other and unspecified alcohol dependence, continuous drinking behavior 10/28/2012  . Palpitations   . PVC (premature ventricular contraction)   . Unspecified hereditary and idiopathic peripheral neuropathy   . UTI (lower urinary tract infection)    pt state uti w/ no pain   Past Surgical History:  Procedure Laterality Date  . ARTHROSCOPIC REPAIR ACL    . BREAST BIOPSY  1973   left  . BREAST REDUCTION SURGERY  12/23/2003  . cataracts    . CHOLECYSTECTOMY  2005  . TONSILLECTOMY AND ADENOIDECTOMY  1941    reports that she quit smoking about 32 years ago. Her smoking use included Cigarettes. She has never used smokeless tobacco. She reports that she drinks alcohol. She reports that she does not use drugs. Social History   Social History  . Marital status: Widowed    Spouse name: N/A  . Number of children: N/A  . Years of education: N/A   Occupational History  . retired Marine scientist    Social History Main Topics  . Smoking status: Former Smoker    Types: Cigarettes    Quit date: 10/09/1983  . Smokeless tobacco: Never Used  . Alcohol use 0.0 oz/week    1 - 2 Shots of liquor per week     Comment: daily  scotch   . Drug use: No  . Sexual activity: No   Other Topics Concern  . Not on file   Social History Narrative   Lives at [facility], in [place]   Widow -2004   Stopped smoking 1985   POA, DNR, Living Will   [place] with walker   Alcohol scotch daily    Exercise none   No family history on file.  Health Maintenance  Topic Date Due  . DEXA SCAN  05/08/2000  . INFLUENZA VACCINE  04/23/2016  . TETANUS/TDAP  09/23/2020  . ZOSTAVAX  Completed  . PNA vac Low Risk Adult  Completed    Allergies  Allergen Reactions  . Mirtazapine     tremor  . Nickel Other (See Comments)    inflammation  . Other Itching    Acrylic nails  . Prednisone     Causes sleep  disturbances       Medication List       Accurate as of 05/14/16 11:10 AM. Always use your most recent med list.          acetaminophen 325 MG tablet Commonly known as:  TYLENOL Take 650 mg by mouth every 4 (four) hours as needed. For pain   acidophilus Caps capsule Take 1 capsule by mouth daily.   cyclobenzaprine 5 MG tablet Commonly known as:  FLEXERIL Take 5 mg by mouth 3 (three) times daily as needed for  muscle spasms.   DIPHENHYD-LIDOCAINE-NYSTATIN MT Use as directed 5 mLs in the mouth or throat 4 (four) times daily. Swish and spit   ELIQUIS 5 MG Tabs tablet Generic drug:  apixaban Take 5 mg by mouth 2 (two) times daily.   flecainide 50 MG tablet Commonly known as:  TAMBOCOR Take 50 mg by mouth 2 (two) times daily. Atrial fibrillation   HYDROcodone-acetaminophen 5-325 MG tablet Commonly known as:  NORCO/VICODIN Take 1 tablet by mouth 3 (three) times daily as needed for moderate pain. 1 tablet by mouth every 4 hours as needed for breakthrough pain (DO NOT exceed 3000 APAP total per day)   losartan 100 MG tablet Commonly known as:  COZAAR Take 100 mg by mouth daily.   methocarbamol 500 MG tablet Commonly known as:  ROBAXIN Take 1-2 tablets (500-1,000 mg total) by mouth 3 (three) times daily as needed for muscle spasms (and pain).   metoprolol succinate 50 MG 24 hr tablet Commonly known as:  TOPROL XL Take 1 tablet (50 mg total) by mouth daily. Take with or immediately following a meal.   NEO-SYNEPHRINE NA Place 1 spray into the nose 2 (two) times daily as needed (congestion).   ondansetron 4 MG tablet Commonly known as:  ZOFRAN Take 4 mg by mouth 2 (two) times daily as needed for nausea.   sennosides-docusate sodium 8.6-50 MG tablet Commonly known as:  SENOKOT-S Take 1 tablet by mouth 2 (two) times daily as needed for constipation.   sodium chloride 0.65 % Soln nasal spray Commonly known as:  OCEAN Place 1 spray into both nostrils daily as needed for  congestion.   zolpidem 10 MG tablet Commonly known as:  AMBIEN Take 10 mg by mouth at bedtime as needed for sleep.       Review of Systems  Constitutional: Positive for activity change. Negative for appetite change, chills and fever.  HENT: Negative for congestion and sore throat.   Eyes: Negative for visual disturbance.       Glasses  Respiratory: Negative for chest tightness and shortness of breath.   Cardiovascular: Positive for palpitations. Negative for chest pain and leg swelling.  Gastrointestinal: Negative for abdominal distention, abdominal pain, blood in stool, constipation, diarrhea, nausea and vomiting.  Genitourinary: Negative for dysuria, frequency and urgency.  Musculoskeletal: Positive for arthralgias, back pain, gait problem and myalgias. Negative for joint swelling.  Skin: Negative for color change.       Ecchymoses behind left ear, under left upper arm with palpable hematoma--very dark purple in color  Neurological: Positive for weakness. Negative for dizziness.  Hematological: Bruises/bleeds easily.  Psychiatric/Behavioral: Negative for agitation, sleep disturbance and suicidal ideas. The patient is not nervous/anxious.        Alcohol abuse; also on ambien long term and refuses to stop it    Vitals:   05/14/16 1041  BP: (!) 151/73  Pulse: 63  Resp: 16  Temp: 97.2 F (36.2 C)  TempSrc: Oral  SpO2: 95%  Weight: 193 lb (87.5 kg)   Body mass index is 29.35 kg/m. Physical Exam  Constitutional: She is oriented to person, place, and time. She appears well-developed and well-nourished. No distress.  HENT:  Right Ear: External ear normal.  Left Ear: External ear normal.  Nose: Nose normal.  Mouth/Throat: Oropharynx is clear and moist. No oropharyngeal exudate.  Eyes: Conjunctivae are normal. Pupils are equal, round, and reactive to light.  glasses  Neck: Neck supple. No JVD present. No thyromegaly present.  Cardiovascular: Normal  rate, regular rhythm,  normal heart sounds and intact distal pulses.   Pulmonary/Chest: Effort normal and breath sounds normal. No respiratory distress.  Abdominal: Soft. Bowel sounds are normal. She exhibits no distension. There is no tenderness.  Musculoskeletal: Normal range of motion. She exhibits tenderness.  Left upper arm, lower back midline and right upper thoracic/posterior rib region  Lymphadenopathy:    She has no cervical adenopathy.  Neurological: She is alert and oriented to person, place, and time. She displays normal reflexes. No cranial nerve deficit. She exhibits normal muscle tone. Coordination normal.  Skin: Skin is warm and dry.  Large dark purple ecchymoses beneath left upper arm and axilla with palpable hematoma in epitrochlear area; small ecchymoses behind left ear, lighter colored ecchymoses over right posterior ribs/shoulder  Psychiatric: She has a normal mood and affect.  Usual sarcastic sense of humor    Labs reviewed: Basic Metabolic Panel:  Recent Labs  12/04/15 1619 04/27/16 1256 05/09/16 0926  NA 136 140 138  K 4.0 3.9 3.5  CL 101 106 105  CO2 24 24 23   GLUCOSE 130* 95 125*  BUN 12 17 11   CREATININE 0.76 0.76 0.77  CALCIUM 9.0 9.0 8.8*   Liver Function Tests:  Recent Labs  10/03/15 04/27/16 1256 05/09/16 0926  AST 16 15 19   ALT 10 13* 16  ALKPHOS 64 89 69  BILITOT  --  1.0 1.1  PROT  --  7.0 7.0  ALBUMIN  --  4.4 4.3   No results for input(s): LIPASE, AMYLASE in the last 8760 hours. No results for input(s): AMMONIA in the last 8760 hours. CBC:  Recent Labs  12/04/15 1619 04/27/16 1256 05/09/16 0926  WBC 6.1 6.7 7.9  NEUTROABS 4.6 4.5 6.4  HGB 14.1 14.3 14.1  HCT 41.5 43.1 41.2  MCV 94.1 95.4 93.8  PLT 218 215 217   Cardiac Enzymes: No results for input(s): CKTOTAL, CKMB, CKMBINDEX, TROPONINI in the last 8760 hours. BNP: Invalid input(s): POCBNP Lab Results  Component Value Date   HGBA1C 5.3 10/03/2015   Lab Results  Component Value Date    TSH 1.215 06/05/2013   Lab Results  Component Value Date   [id number] 06/05/2013   No results found for: FOLATE No results found for: IRON, TIBC, FERRITIN  Imaging and Procedures obtained prior to SNF admission: Dg Ribs Unilateral W/chest Right  Result Date: 05/09/2016 CLINICAL DATA:  Initial evaluation for acute trauma, recent fall. Now with acute posterior right rib pain. EXAM: RIGHT RIBS AND CHEST - 3+ VIEW COMPARISON:  Prior radiograph from 12/04/2015. FINDINGS: Cardiac silhouette is enlarged and stable in contour relative to previous exam. Mediastinal silhouette within normal limits. Atheromatous plaque noted within the aortic arch. Lungs are normally inflated. Chronic scarring noted at the left lung base, similar to prior. No focal infiltrate or pulmonary edema. No pleural effusion. No pneumothorax. Metallic BB overlies the lower posterior right ribs. No acute displaced rib fracture identified, although evaluation somewhat limited by body habitus. IMPRESSION: 1. No acute displaced right-sided rib fracture identified. 2. No active cardiopulmonary disease. 3. Mild scarring at the left lung base, stable. 4. Cardiomegaly with aortic atherosclerosis, stable. Electronically Signed   By: Jeannine Boga M.D.   On: 05/09/2016 13:14   Dg Elbow Complete Left  Result Date: 05/09/2016 CLINICAL DATA:  Initial evaluation for acute trauma, fall. EXAM: LEFT ELBOW - COMPLETE 3+ VIEW COMPARISON:  None. FINDINGS: No acute fracture or dislocation. No joint effusion. Mild degenerative spurring at the  olecranon. No other significant degenerative changes. Osseous mineralization within normal limits. No acute soft tissue abnormality. IMPRESSION: No acute osseous abnormality about the elbow. Electronically Signed   By: Jeannine Boga M.D.   On: 05/09/2016 13:16   Ct Abdomen Pelvis W Contrast  Result Date: 05/09/2016 CLINICAL DATA:  Abdominal pain EXAM: CT ABDOMEN AND PELVIS WITH CONTRAST TECHNIQUE:  Multidetector CT imaging of the abdomen and pelvis was performed using the standard protocol following bolus administration of intravenous contrast. CONTRAST:  169mL ISOVUE-300 IOPAMIDOL (ISOVUE-300) INJECTION 61% COMPARISON:  04/27/2016 FINDINGS: Lower chest:  The lung bases are unremarkable. Hepatobiliary: Enhanced liver the shows minimal intrahepatic biliary ductal dilatation no focal hepatic mass. The patient is status postcholecystectomy. Pancreas: Enhanced pancreas is unremarkable. Spleen: Enhanced spleen is unremarkable. Adrenals/Urinary Tract: No adrenal gland mass. Kidneys are symmetrical in size and enhancement. Mild lobulated renal contour. No hydronephrosis or hydroureter. No calcified ureteral calculi. The urinary bladder is unremarkable. Delayed renal images shows bilateral renal symmetrical excretion. Bilateral visualized proximal ureter is unremarkable. Stomach/Bowel: Study is limited without oral contrast. There is no small bowel obstruction. No thickened or dilated small bowel loops. Moderate stool noted within right colon and cecum. No pericecal inflammation. There is a low lying cecum. The terminal ileum is unremarkable. Some colonic gas and stool noted in transverse colon. Some colonic gas noted in distal left colon and proximal sigmoid colon. Redundant sigmoid colon. Moderate gas noted mid sigmoid colon. No distal colonic obstruction. No acute colitis or diverticulitis. Vascular/Lymphatic: No aortic aneurysm. Atherosclerotic calcifications of abdominal aorta and iliac arteries are noted. No retroperitoneal or mesenteric adenopathy. Reproductive: The uterus is atrophic. No adnexal mass. No pelvic free fluid. Other: There is no ascites or free abdominal air. Normal appendix partially visualized in coronal image 86. No inguinal adenopathy. Musculoskeletal: Sagittal images of the spine shows degenerative changes thoracolumbar spine. Mild degenerative changes bilateral SI joints. IMPRESSION: 1.  Status post cholecystectomy. 2. Moderate stool noted within right colon and cecum. No pericecal inflammation. Low lying cecum. Normal appendix. 3. No small bowel obstruction. 4. Redundant sigmoid colon. Moderate gas noted within mid sigmoid colon. No evidence of colitis or diverticulitis. 5. No hydronephrosis or hydroureter. No nephrolithiasis. No calcified ureteral calculi. 6. Degenerative changes thoracic spine. Electronically Signed   By: Lahoma Crocker M.D.   On: 05/09/2016 11:48    Assessment/Plan 1. Abnormality of gait -has been falling more often--multifactorial:  Has never regained strength fully after leg fracture, continues to drink scotch (2 at night) then take ambien, then get up and do things like look for dessert, now on multiple pain meds along with all of this -here for PT, OT and plan is to go back to [place] but with full time caregivers  2. Alcohol dependence, continuous ([place]) -she is off alcohol here in rehab so she's doing better, more alert, not confused and inappropriate as she sometimes is after drinking  3. Insomnia -has been on longstanding ambien and myself and providers before me have tried to get her off of it, but she even agrees to pay for it b/c insurance didn't want to cover it (due to [place]'s criteria)  4. Nausea without vomiting -ongoing to some degree--suspect withdrawal related now, but had been due to flagyl and alcohol together also -increase zofran to bid  5. Lumbar paraspinal muscle spasm -not using muscle relaxants due to ineffectiveness she says -decrease hydrocodone to tid routine and 1 q 8 prn instead of having the option for 1 or 2  Every 4 hours--she is very high risk for dependence and this needs to be tapered off asap  6. Paroxysmal atrial fibrillation ([facility]) -cont flecainide, toprol xl and eliquis  7.  Uncontrolled HTN -increase toprol xl to 75mg  daily and monitor -also advised to give one dose of lopressor in the evening to decrease bp, but I suspect  her bp is up due to pain and alcohol withdrawals  Family/ staff Communication: discussed with rehab nursing  Labs/tests ordered: no new

## 2016-05-15 DIAGNOSIS — M6259 Muscle wasting and atrophy, not elsewhere classified, multiple sites: Secondary | ICD-10-CM | POA: Diagnosis not present

## 2016-05-15 DIAGNOSIS — M62561 Muscle wasting and atrophy, not elsewhere classified, right lower leg: Secondary | ICD-10-CM | POA: Diagnosis not present

## 2016-05-15 DIAGNOSIS — R278 Other lack of coordination: Secondary | ICD-10-CM | POA: Diagnosis not present

## 2016-05-15 DIAGNOSIS — M62562 Muscle wasting and atrophy, not elsewhere classified, left lower leg: Secondary | ICD-10-CM | POA: Diagnosis not present

## 2016-05-15 DIAGNOSIS — R2689 Other abnormalities of gait and mobility: Secondary | ICD-10-CM | POA: Diagnosis not present

## 2016-05-15 DIAGNOSIS — M170 Bilateral primary osteoarthritis of knee: Secondary | ICD-10-CM | POA: Diagnosis not present

## 2016-05-16 DIAGNOSIS — R278 Other lack of coordination: Secondary | ICD-10-CM | POA: Diagnosis not present

## 2016-05-16 DIAGNOSIS — M62561 Muscle wasting and atrophy, not elsewhere classified, right lower leg: Secondary | ICD-10-CM | POA: Diagnosis not present

## 2016-05-16 DIAGNOSIS — M62562 Muscle wasting and atrophy, not elsewhere classified, left lower leg: Secondary | ICD-10-CM | POA: Diagnosis not present

## 2016-05-16 DIAGNOSIS — M6259 Muscle wasting and atrophy, not elsewhere classified, multiple sites: Secondary | ICD-10-CM | POA: Diagnosis not present

## 2016-05-16 DIAGNOSIS — R2689 Other abnormalities of gait and mobility: Secondary | ICD-10-CM | POA: Diagnosis not present

## 2016-05-16 DIAGNOSIS — M170 Bilateral primary osteoarthritis of knee: Secondary | ICD-10-CM | POA: Diagnosis not present

## 2016-05-17 DIAGNOSIS — R278 Other lack of coordination: Secondary | ICD-10-CM | POA: Diagnosis not present

## 2016-05-17 DIAGNOSIS — M62562 Muscle wasting and atrophy, not elsewhere classified, left lower leg: Secondary | ICD-10-CM | POA: Diagnosis not present

## 2016-05-17 DIAGNOSIS — M170 Bilateral primary osteoarthritis of knee: Secondary | ICD-10-CM | POA: Diagnosis not present

## 2016-05-17 DIAGNOSIS — R2689 Other abnormalities of gait and mobility: Secondary | ICD-10-CM | POA: Diagnosis not present

## 2016-05-17 DIAGNOSIS — M62561 Muscle wasting and atrophy, not elsewhere classified, right lower leg: Secondary | ICD-10-CM | POA: Diagnosis not present

## 2016-05-17 DIAGNOSIS — M6259 Muscle wasting and atrophy, not elsewhere classified, multiple sites: Secondary | ICD-10-CM | POA: Diagnosis not present

## 2016-05-20 DIAGNOSIS — M170 Bilateral primary osteoarthritis of knee: Secondary | ICD-10-CM | POA: Diagnosis not present

## 2016-05-20 DIAGNOSIS — R278 Other lack of coordination: Secondary | ICD-10-CM | POA: Diagnosis not present

## 2016-05-20 DIAGNOSIS — M62561 Muscle wasting and atrophy, not elsewhere classified, right lower leg: Secondary | ICD-10-CM | POA: Diagnosis not present

## 2016-05-20 DIAGNOSIS — M62562 Muscle wasting and atrophy, not elsewhere classified, left lower leg: Secondary | ICD-10-CM | POA: Diagnosis not present

## 2016-05-20 DIAGNOSIS — M62830 Muscle spasm of back: Secondary | ICD-10-CM | POA: Insufficient documentation

## 2016-05-20 DIAGNOSIS — M6259 Muscle wasting and atrophy, not elsewhere classified, multiple sites: Secondary | ICD-10-CM | POA: Diagnosis not present

## 2016-05-20 DIAGNOSIS — R2689 Other abnormalities of gait and mobility: Secondary | ICD-10-CM | POA: Diagnosis not present

## 2016-05-21 DIAGNOSIS — M62562 Muscle wasting and atrophy, not elsewhere classified, left lower leg: Secondary | ICD-10-CM | POA: Diagnosis not present

## 2016-05-21 DIAGNOSIS — M62561 Muscle wasting and atrophy, not elsewhere classified, right lower leg: Secondary | ICD-10-CM | POA: Diagnosis not present

## 2016-05-21 DIAGNOSIS — R2689 Other abnormalities of gait and mobility: Secondary | ICD-10-CM | POA: Diagnosis not present

## 2016-05-21 DIAGNOSIS — M170 Bilateral primary osteoarthritis of knee: Secondary | ICD-10-CM | POA: Diagnosis not present

## 2016-05-21 DIAGNOSIS — M6259 Muscle wasting and atrophy, not elsewhere classified, multiple sites: Secondary | ICD-10-CM | POA: Diagnosis not present

## 2016-05-21 DIAGNOSIS — R278 Other lack of coordination: Secondary | ICD-10-CM | POA: Diagnosis not present

## 2016-05-22 ENCOUNTER — Other Ambulatory Visit: Payer: Self-pay

## 2016-05-22 DIAGNOSIS — R278 Other lack of coordination: Secondary | ICD-10-CM | POA: Diagnosis not present

## 2016-05-22 DIAGNOSIS — M62561 Muscle wasting and atrophy, not elsewhere classified, right lower leg: Secondary | ICD-10-CM | POA: Diagnosis not present

## 2016-05-22 DIAGNOSIS — M6259 Muscle wasting and atrophy, not elsewhere classified, multiple sites: Secondary | ICD-10-CM | POA: Diagnosis not present

## 2016-05-22 DIAGNOSIS — M170 Bilateral primary osteoarthritis of knee: Secondary | ICD-10-CM | POA: Diagnosis not present

## 2016-05-22 DIAGNOSIS — R2689 Other abnormalities of gait and mobility: Secondary | ICD-10-CM | POA: Diagnosis not present

## 2016-05-22 DIAGNOSIS — M62562 Muscle wasting and atrophy, not elsewhere classified, left lower leg: Secondary | ICD-10-CM | POA: Diagnosis not present

## 2016-05-22 MED ORDER — HYDROCODONE-ACETAMINOPHEN 5-325 MG PO TABS: ORAL_TABLET | ORAL | 0 refills | Status: DC

## 2016-05-22 NOTE — Telephone Encounter (Signed)
Faxed to [facility] Fax Number: [phone], Phone Number [phone]  

## 2016-05-23 DIAGNOSIS — M170 Bilateral primary osteoarthritis of knee: Secondary | ICD-10-CM | POA: Diagnosis not present

## 2016-05-23 DIAGNOSIS — M62562 Muscle wasting and atrophy, not elsewhere classified, left lower leg: Secondary | ICD-10-CM | POA: Diagnosis not present

## 2016-05-23 DIAGNOSIS — M62561 Muscle wasting and atrophy, not elsewhere classified, right lower leg: Secondary | ICD-10-CM | POA: Diagnosis not present

## 2016-05-23 DIAGNOSIS — R2689 Other abnormalities of gait and mobility: Secondary | ICD-10-CM | POA: Diagnosis not present

## 2016-05-23 DIAGNOSIS — R278 Other lack of coordination: Secondary | ICD-10-CM | POA: Diagnosis not present

## 2016-05-23 DIAGNOSIS — M6259 Muscle wasting and atrophy, not elsewhere classified, multiple sites: Secondary | ICD-10-CM | POA: Diagnosis not present

## 2016-05-24 DIAGNOSIS — M62562 Muscle wasting and atrophy, not elsewhere classified, left lower leg: Secondary | ICD-10-CM | POA: Diagnosis not present

## 2016-05-24 DIAGNOSIS — M62561 Muscle wasting and atrophy, not elsewhere classified, right lower leg: Secondary | ICD-10-CM | POA: Diagnosis not present

## 2016-05-24 DIAGNOSIS — M6259 Muscle wasting and atrophy, not elsewhere classified, multiple sites: Secondary | ICD-10-CM | POA: Diagnosis not present

## 2016-05-24 DIAGNOSIS — M25522 Pain in left elbow: Secondary | ICD-10-CM | POA: Diagnosis not present

## 2016-05-24 DIAGNOSIS — R278 Other lack of coordination: Secondary | ICD-10-CM | POA: Diagnosis not present

## 2016-05-24 DIAGNOSIS — M5489 Other dorsalgia: Secondary | ICD-10-CM | POA: Diagnosis not present

## 2016-05-24 DIAGNOSIS — R2681 Unsteadiness on feet: Secondary | ICD-10-CM | POA: Diagnosis not present

## 2016-05-24 DIAGNOSIS — Z9181 History of falling: Secondary | ICD-10-CM | POA: Diagnosis not present

## 2016-05-24 DIAGNOSIS — M546 Pain in thoracic spine: Secondary | ICD-10-CM | POA: Diagnosis not present

## 2016-05-24 DIAGNOSIS — R2689 Other abnormalities of gait and mobility: Secondary | ICD-10-CM | POA: Diagnosis not present

## 2016-05-24 DIAGNOSIS — M170 Bilateral primary osteoarthritis of knee: Secondary | ICD-10-CM | POA: Diagnosis not present

## 2016-05-27 DIAGNOSIS — R2689 Other abnormalities of gait and mobility: Secondary | ICD-10-CM | POA: Diagnosis not present

## 2016-05-27 DIAGNOSIS — M62561 Muscle wasting and atrophy, not elsewhere classified, right lower leg: Secondary | ICD-10-CM | POA: Diagnosis not present

## 2016-05-27 DIAGNOSIS — M5489 Other dorsalgia: Secondary | ICD-10-CM | POA: Diagnosis not present

## 2016-05-27 DIAGNOSIS — M62562 Muscle wasting and atrophy, not elsewhere classified, left lower leg: Secondary | ICD-10-CM | POA: Diagnosis not present

## 2016-05-27 DIAGNOSIS — M25522 Pain in left elbow: Secondary | ICD-10-CM | POA: Diagnosis not present

## 2016-05-27 DIAGNOSIS — M6259 Muscle wasting and atrophy, not elsewhere classified, multiple sites: Secondary | ICD-10-CM | POA: Diagnosis not present

## 2016-05-28 DIAGNOSIS — M6259 Muscle wasting and atrophy, not elsewhere classified, multiple sites: Secondary | ICD-10-CM | POA: Diagnosis not present

## 2016-05-28 DIAGNOSIS — M62562 Muscle wasting and atrophy, not elsewhere classified, left lower leg: Secondary | ICD-10-CM | POA: Diagnosis not present

## 2016-05-28 DIAGNOSIS — M62561 Muscle wasting and atrophy, not elsewhere classified, right lower leg: Secondary | ICD-10-CM | POA: Diagnosis not present

## 2016-05-28 DIAGNOSIS — M5489 Other dorsalgia: Secondary | ICD-10-CM | POA: Diagnosis not present

## 2016-05-28 DIAGNOSIS — R2689 Other abnormalities of gait and mobility: Secondary | ICD-10-CM | POA: Diagnosis not present

## 2016-05-28 DIAGNOSIS — M25522 Pain in left elbow: Secondary | ICD-10-CM | POA: Diagnosis not present

## 2016-05-29 DIAGNOSIS — M5489 Other dorsalgia: Secondary | ICD-10-CM | POA: Diagnosis not present

## 2016-05-29 DIAGNOSIS — M62561 Muscle wasting and atrophy, not elsewhere classified, right lower leg: Secondary | ICD-10-CM | POA: Diagnosis not present

## 2016-05-29 DIAGNOSIS — R2689 Other abnormalities of gait and mobility: Secondary | ICD-10-CM | POA: Diagnosis not present

## 2016-05-29 DIAGNOSIS — M6259 Muscle wasting and atrophy, not elsewhere classified, multiple sites: Secondary | ICD-10-CM | POA: Diagnosis not present

## 2016-05-29 DIAGNOSIS — M25522 Pain in left elbow: Secondary | ICD-10-CM | POA: Diagnosis not present

## 2016-05-29 DIAGNOSIS — M62562 Muscle wasting and atrophy, not elsewhere classified, left lower leg: Secondary | ICD-10-CM | POA: Diagnosis not present

## 2016-05-30 DIAGNOSIS — M6259 Muscle wasting and atrophy, not elsewhere classified, multiple sites: Secondary | ICD-10-CM | POA: Diagnosis not present

## 2016-05-30 DIAGNOSIS — M5489 Other dorsalgia: Secondary | ICD-10-CM | POA: Diagnosis not present

## 2016-05-30 DIAGNOSIS — R2689 Other abnormalities of gait and mobility: Secondary | ICD-10-CM | POA: Diagnosis not present

## 2016-05-30 DIAGNOSIS — M62561 Muscle wasting and atrophy, not elsewhere classified, right lower leg: Secondary | ICD-10-CM | POA: Diagnosis not present

## 2016-05-30 DIAGNOSIS — M25522 Pain in left elbow: Secondary | ICD-10-CM | POA: Diagnosis not present

## 2016-05-30 DIAGNOSIS — M62562 Muscle wasting and atrophy, not elsewhere classified, left lower leg: Secondary | ICD-10-CM | POA: Diagnosis not present

## 2016-06-04 DIAGNOSIS — M25522 Pain in left elbow: Secondary | ICD-10-CM | POA: Diagnosis not present

## 2016-06-04 DIAGNOSIS — M62562 Muscle wasting and atrophy, not elsewhere classified, left lower leg: Secondary | ICD-10-CM | POA: Diagnosis not present

## 2016-06-04 DIAGNOSIS — R2689 Other abnormalities of gait and mobility: Secondary | ICD-10-CM | POA: Diagnosis not present

## 2016-06-04 DIAGNOSIS — M6259 Muscle wasting and atrophy, not elsewhere classified, multiple sites: Secondary | ICD-10-CM | POA: Diagnosis not present

## 2016-06-04 DIAGNOSIS — M62561 Muscle wasting and atrophy, not elsewhere classified, right lower leg: Secondary | ICD-10-CM | POA: Diagnosis not present

## 2016-06-04 DIAGNOSIS — M5489 Other dorsalgia: Secondary | ICD-10-CM | POA: Diagnosis not present

## 2016-06-08 ENCOUNTER — Other Ambulatory Visit: Payer: Self-pay | Admitting: Cardiology

## 2016-06-10 NOTE — Telephone Encounter (Signed)
Rx(s) sent to pharmacy electronically.  

## 2016-06-12 ENCOUNTER — Non-Acute Institutional Stay: Payer: Medicare Other | Admitting: Internal Medicine

## 2016-06-12 ENCOUNTER — Encounter: Payer: Self-pay | Admitting: Internal Medicine

## 2016-06-12 VITALS — BP 128/60 | HR 65 | Temp 97.8°F | Wt 191.0 lb

## 2016-06-12 DIAGNOSIS — F1020 Alcohol dependence, uncomplicated: Secondary | ICD-10-CM | POA: Diagnosis not present

## 2016-06-12 DIAGNOSIS — R269 Unspecified abnormalities of gait and mobility: Secondary | ICD-10-CM

## 2016-06-12 DIAGNOSIS — I480 Paroxysmal atrial fibrillation: Secondary | ICD-10-CM | POA: Diagnosis not present

## 2016-06-12 DIAGNOSIS — R110 Nausea: Secondary | ICD-10-CM | POA: Diagnosis not present

## 2016-06-12 DIAGNOSIS — I10 Essential (primary) hypertension: Secondary | ICD-10-CM | POA: Diagnosis not present

## 2016-06-12 DIAGNOSIS — G4700 Insomnia, unspecified: Secondary | ICD-10-CM | POA: Diagnosis not present

## 2016-06-12 DIAGNOSIS — M62830 Muscle spasm of back: Secondary | ICD-10-CM

## 2016-06-12 MED ORDER — HYDROCODONE-ACETAMINOPHEN 5-325 MG PO TABS: ORAL_TABLET | ORAL | 0 refills | Status: DC

## 2016-06-12 MED ORDER — HYDROCODONE-ACETAMINOPHEN 5-325 MG PO TABS: 1.0000 | ORAL_TABLET | Freq: Every evening | ORAL | 0 refills | Status: DC | PRN

## 2016-06-12 MED ORDER — METOPROLOL SUCCINATE ER 100 MG PO TB24: 100.0000 mg | ORAL_TABLET | Freq: Every day | ORAL | 5 refills | Status: DC

## 2016-06-12 NOTE — Progress Notes (Signed)
Location:  [facility] Provider: Kayte Borchard L. Mariea Clonts, D.O., C.M.D.  Code Status: DNR Goals of Care:  Advanced Directives 06/12/2016  Does patient have an advance directive? Yes  Type of Paramedic of [place];Out of facility DNR (pink MOST or yellow form)  Does patient want to make changes to advanced directive? -  Copy of advanced directive(s) in chart? Yes  Pre-existing out of facility DNR order (yellow form or pink MOST form) Yellow form placed in chart (order not valid for inpatient use)     Chief Complaint  Patient presents with  . Medical Management of Chronic Issues    follow up from rehab  . transitional care    HPI: Patient is a 82 y.o. female seen today for hospital follow-up s/p admission at [facility] from 8/18 to   Says she is down to 1 scotch per day (a healthy one).  Waits until 6pm.  Wants to get rid of having a caregiver from [employer] b/c she says she is sleeping through half of that, but apparently that is the time when she was falling after drinking and taking ambien, then getting up to eat desserts late in the evening.  Says she is usually eating her sweets at lunch instead.  Pain is all better from her falls and bruising improved.  Still has her usual aches and pains.  We modified her meds according to how she is actually using them now so that she does not call for refills of risky meds.    She continues with her chronic nausea.  No vomiting though (that happened when drinking on flagyl) Past Medical History:  Diagnosis Date  . A-fib ([place])   . Abdominal pain, unspecified site   . Abnormality of gait 06/02/2013  . Arthritis   . Cataracts, bilateral   . Depression   . Diverticulosis of colon (without mention of hemorrhage)   . Diverticulosis of colon (without mention of hemorrhage) 10/27/2012  . Edema 10/27/2012  . GI bleed 06/2002  . Hemorrhage of gastrointestinal tract, unspecified   . History of stomach ulcers   .  Hypertension   . Insomnia, unspecified 10/27/2012  . Localized osteoarthrosis not specified whether primary or secondary, lower leg 05/31/2011  . Migraines   . Nausea alone 10/28/2012  . Obesity, unspecified 10/27/2012  . Osteoarthrosis, unspecified whether generalized or localized, lower leg   . Other acariasis    peripherial neuropathy  . Other and unspecified alcohol dependence, continuous drinking behavior 10/28/2012  . Palpitations   . PVC (premature ventricular contraction)   . Unspecified hereditary and idiopathic peripheral neuropathy   . UTI (lower urinary tract infection)    pt state uti w/ no pain    Past Surgical History:  Procedure Laterality Date  . ARTHROSCOPIC REPAIR ACL    . BREAST BIOPSY  1973   left  . BREAST REDUCTION SURGERY  12/23/2003  . cataracts    . CHOLECYSTECTOMY  2005  . TONSILLECTOMY AND ADENOIDECTOMY  1941    Allergies  Allergen Reactions  . Mirtazapine     tremor  . Nickel Other (See Comments)    inflammation  . Other Itching    Acrylic nails  . Prednisone     Causes sleep disturbances       Medication List       Accurate as of 06/12/16  1:41 PM. Always use your most recent med list.          acetaminophen 325 MG tablet Commonly  known as:  TYLENOL Take 650 mg by mouth every 4 (four) hours as needed. For pain   acidophilus Caps capsule Take 1 capsule by mouth daily.   cyclobenzaprine 5 MG tablet Commonly known as:  FLEXERIL Take 5 mg by mouth 3 (three) times daily as needed for muscle spasms.   ELIQUIS 5 MG Tabs tablet Generic drug:  apixaban Take 5 mg by mouth 2 (two) times daily.   flecainide 50 MG tablet Commonly known as:  TAMBOCOR Take 50 mg by mouth 2 (two) times daily. Atrial fibrillation   HYDROcodone-acetaminophen 5-325 MG tablet Commonly known as:  NORCO/VICODIN Take 1 tablet by mouth every 8 hours as needed for pain scale 1-5 (control) Take 2 by mouth every 8 hours as needed for pain scale 6-10 (control)   losartan  100 MG tablet Commonly known as:  COZAAR Take 100 mg by mouth daily.   methocarbamol 500 MG tablet Commonly known as:  ROBAXIN Take 1-2 tablets (500-1,000 mg total) by mouth 3 (three) times daily as needed for muscle spasms (and pain).   metoprolol succinate 50 MG 24 hr tablet Commonly known as:  TOPROL-XL TAKE 1 TABLET BY MOUTH DAILY(TAKE WITH OR IMMEDIATELY FOLLOWING A MEAL)   NEO-SYNEPHRINE NA Place 1 spray into the nose 2 (two) times daily as needed (congestion).   ondansetron 4 MG tablet Commonly known as:  ZOFRAN Take 4 mg by mouth 2 (two) times daily as needed for nausea.   sennosides-docusate sodium 8.6-50 MG tablet Commonly known as:  SENOKOT-S Take 1 tablet by mouth 2 (two) times daily as needed for constipation.   sodium chloride 0.65 % Soln nasal spray Commonly known as:  OCEAN Place 1 spray into both nostrils daily as needed for congestion.   zolpidem 10 MG tablet Commonly known as:  AMBIEN Take 10 mg by mouth at bedtime as needed for sleep.       Review of Systems:  Review of Systems  Constitutional: Negative for chills, fever and malaise/fatigue.  HENT: Positive for hearing loss. Negative for congestion.   Eyes: Negative for blurred vision.       Glasses  Respiratory: Negative for cough and shortness of breath.   Cardiovascular: Negative for chest pain, palpitations and leg swelling.  Gastrointestinal: Positive for nausea. Negative for abdominal pain, blood in stool, constipation, diarrhea, heartburn, melena and vomiting.  Genitourinary: Negative for dysuria.  Musculoskeletal: Positive for back pain and joint pain. Negative for falls.       No falls since rehab stay and increased caregiver hours  Skin: Negative for itching and rash.  Neurological: Positive for weakness. Negative for dizziness and loss of consciousness.  Endo/Heme/Allergies: Bruises/bleeds easily.  Psychiatric/Behavioral: Negative for depression and memory loss. The patient is  nervous/anxious and has insomnia.        Sleeps well with her Lorrin Mais use    Health Maintenance  Topic Date Due  . DEXA SCAN  05/08/2000  . INFLUENZA VACCINE  04/23/2016  . TETANUS/TDAP  09/23/2020  . ZOSTAVAX  Completed  . PNA vac Low Risk Adult  Completed    Physical Exam: Vitals:   06/12/16 1330  BP: 128/60  Pulse: 65  Temp: 97.8 F (36.6 C)  TempSrc: Oral  SpO2: 96%  Weight: 191 lb (86.6 kg)   Body mass index is 29.04 kg/m. Physical Exam  Constitutional: She is oriented to person, place, and time. She appears well-developed and well-nourished. No distress.  Eyes:  glasses  Cardiovascular: Normal rate, regular rhythm, normal  heart sounds and intact distal pulses.   Pulmonary/Chest: Effort normal and breath sounds normal. No respiratory distress.  Abdominal: Soft. Bowel sounds are normal. She exhibits no distension. There is no tenderness.  Musculoskeletal: Normal range of motion.  Came in wheelchair, ambulates with walker at home  Neurological: She is alert and oriented to person, place, and time.  Skin: Skin is warm and dry.  Psychiatric: She has a normal mood and affect.    Labs reviewed: Basic Metabolic Panel:  Recent Labs  12/04/15 1619 04/27/16 1256 05/09/16 0926  NA 136 140 138  K 4.0 3.9 3.5  CL 101 106 105  CO2 '24 24 23  ' GLUCOSE 130* 95 125*  BUN '12 17 11  ' CREATININE 0.76 0.76 0.77  CALCIUM 9.0 9.0 8.8*   Liver Function Tests:  Recent Labs  10/03/15 04/27/16 1256 05/09/16 0926  AST '16 15 19  ' ALT 10 13* 16  ALKPHOS 64 89 69  BILITOT  --  1.0 1.1  PROT  --  7.0 7.0  ALBUMIN  --  4.4 4.3   No results for input(s): LIPASE, AMYLASE in the last 8760 hours. No results for input(s): AMMONIA in the last 8760 hours. CBC:  Recent Labs  12/04/15 1619 04/27/16 1256 05/09/16 0926  WBC 6.1 6.7 7.9  NEUTROABS 4.6 4.5 6.4  HGB 14.1 14.3 14.1  HCT 41.5 43.1 41.2  MCV 94.1 95.4 93.8  PLT 218 215 217   Lipid Panel:  Recent Labs  10/03/15    CHOL 171  HDL 63  LDLCALC 81  TRIG 136   Lab Results  Component Value Date   HGBA1C 5.3 10/03/2015    Procedures since last visit: Dg Ribs Unilateral W/chest Right  Result Date: 05/09/2016 CLINICAL DATA:  Initial evaluation for acute trauma, recent fall. Now with acute posterior right rib pain. EXAM: RIGHT RIBS AND CHEST - 3+ VIEW COMPARISON:  Prior radiograph from 12/04/2015. FINDINGS: Cardiac silhouette is enlarged and stable in contour relative to previous exam. Mediastinal silhouette within normal limits. Atheromatous plaque noted within the aortic arch. Lungs are normally inflated. Chronic scarring noted at the left lung base, similar to prior. No focal infiltrate or pulmonary edema. No pleural effusion. No pneumothorax. Metallic BB overlies the lower posterior right ribs. No acute displaced rib fracture identified, although evaluation somewhat limited by body habitus. IMPRESSION: 1. No acute displaced right-sided rib fracture identified. 2. No active cardiopulmonary disease. 3. Mild scarring at the left lung base, stable. 4. Cardiomegaly with aortic atherosclerosis, stable. Electronically Signed   By: Jeannine Boga M.D.   On: 05/09/2016 13:14   Dg Elbow Complete Left  Result Date: 05/09/2016 CLINICAL DATA:  Initial evaluation for acute trauma, fall. EXAM: LEFT ELBOW - COMPLETE 3+ VIEW COMPARISON:  None. FINDINGS: No acute fracture or dislocation. No joint effusion. Mild degenerative spurring at the olecranon. No other significant degenerative changes. Osseous mineralization within normal limits. No acute soft tissue abnormality. IMPRESSION: No acute osseous abnormality about the elbow. Electronically Signed   By: Jeannine Boga M.D.   On: 05/09/2016 13:16   Ct Abdomen Pelvis W Contrast  Result Date: 05/09/2016 CLINICAL DATA:  Abdominal pain EXAM: CT ABDOMEN AND PELVIS WITH CONTRAST TECHNIQUE: Multidetector CT imaging of the abdomen and pelvis was performed using the standard  protocol following bolus administration of intravenous contrast. CONTRAST:  116m ISOVUE-300 IOPAMIDOL (ISOVUE-300) INJECTION 61% COMPARISON:  04/27/2016 FINDINGS: Lower chest:  The lung bases are unremarkable. Hepatobiliary: Enhanced liver the shows minimal intrahepatic biliary  ductal dilatation no focal hepatic mass. The patient is status postcholecystectomy. Pancreas: Enhanced pancreas is unremarkable. Spleen: Enhanced spleen is unremarkable. Adrenals/Urinary Tract: No adrenal gland mass. Kidneys are symmetrical in size and enhancement. Mild lobulated renal contour. No hydronephrosis or hydroureter. No calcified ureteral calculi. The urinary bladder is unremarkable. Delayed renal images shows bilateral renal symmetrical excretion. Bilateral visualized proximal ureter is unremarkable. Stomach/Bowel: Study is limited without oral contrast. There is no small bowel obstruction. No thickened or dilated small bowel loops. Moderate stool noted within right colon and cecum. No pericecal inflammation. There is a low lying cecum. The terminal ileum is unremarkable. Some colonic gas and stool noted in transverse colon. Some colonic gas noted in distal left colon and proximal sigmoid colon. Redundant sigmoid colon. Moderate gas noted mid sigmoid colon. No distal colonic obstruction. No acute colitis or diverticulitis. Vascular/Lymphatic: No aortic aneurysm. Atherosclerotic calcifications of abdominal aorta and iliac arteries are noted. No retroperitoneal or mesenteric adenopathy. Reproductive: The uterus is atrophic. No adnexal mass. No pelvic free fluid. Other: There is no ascites or free abdominal air. Normal appendix partially visualized in coronal image 86. No inguinal adenopathy. Musculoskeletal: Sagittal images of the spine shows degenerative changes thoracolumbar spine. Mild degenerative changes bilateral SI joints. IMPRESSION: 1. Status post cholecystectomy. 2. Moderate stool noted within right colon and cecum. No  pericecal inflammation. Low lying cecum. Normal appendix. 3. No small bowel obstruction. 4. Redundant sigmoid colon. Moderate gas noted within mid sigmoid colon. No evidence of colitis or diverticulitis. 5. No hydronephrosis or hydroureter. No nephrolithiasis. No calcified ureteral calculi. 6. Degenerative changes thoracic spine. Electronically Signed   By: Lahoma Crocker M.D.   On: 05/09/2016 11:48    Assessment/Plan 1. Uncontrolled hypertension -bp better with metoprolol 135m--this was increased during her rehab stay when bps continued running as high as 2292Ksystolic -HR has been in 646K-pt wants to take less again, but has no side effects from the higher dose and bp is better now  2. Paroxysmal atrial fibrillation (HCC) -stable at this time on current therapy -is on eliquis, must monitor carefully with her scotch intake -cont flecainide also which she's been on since before I met her -finally sees cardiology in Dec  3. Lumbar paraspinal muscle spasm -resolved, will d/c all muscle relaxers which also do not mix well with her ambien and her scotch intake  4. Alcohol dependence, continuous ([place] -says she's cut down to 1 scotch per night--hopefully true b/c she is at high fall risk and gets confused and amnestic at times with these mixtures  5. Abnormality of gait -using walker and wheelchair to get around  6. Nausea without vomiting -ongoing, says the zofran doesn't do much -phenergan also is a bad idea with her other meds  7. Insomnia -ongoing problem--adamant about continuing ambien despite her falling  Labs/tests ordered:  No new today Next appt:  07/10/2016--pt is to call with bp checks if she's having low bps on the 1031m(but hasn't been happening so far)  Thersia Petraglia L. Loise Esguerra, D.O. [facility] [address] [address] Phone (Mon-Fri 8am-5pm):  [phone] Call:  [phone] follow prompts after 5pm &  weekends Office Phone:  [phone] Fax:  [phone]

## 2016-06-12 NOTE — Patient Instructions (Addendum)
Continue to take 100mg  of metoprolol for now.  Your blood pressure was good today. Please check your bp daily at least one hour after your medication is taken.   Bring the list of bps to [place] after 2 weeks.    I have updated your med list.

## 2016-06-14 ENCOUNTER — Other Ambulatory Visit: Payer: Self-pay | Admitting: Internal Medicine

## 2016-06-24 ENCOUNTER — Ambulatory Visit (INDEPENDENT_AMBULATORY_CARE_PROVIDER_SITE_OTHER): Payer: Medicare Other | Admitting: Physician Assistant

## 2016-06-24 DIAGNOSIS — M1712 Unilateral primary osteoarthritis, left knee: Secondary | ICD-10-CM | POA: Diagnosis not present

## 2016-06-24 DIAGNOSIS — M1711 Unilateral primary osteoarthritis, right knee: Secondary | ICD-10-CM | POA: Diagnosis not present

## 2016-07-09 ENCOUNTER — Other Ambulatory Visit: Payer: Self-pay | Admitting: Internal Medicine

## 2016-07-10 ENCOUNTER — Encounter: Payer: Self-pay | Admitting: Internal Medicine

## 2016-07-16 ENCOUNTER — Other Ambulatory Visit: Payer: Self-pay | Admitting: Internal Medicine

## 2016-07-19 DIAGNOSIS — Z23 Encounter for immunization: Secondary | ICD-10-CM | POA: Diagnosis not present

## 2016-07-22 DIAGNOSIS — B351 Tinea unguium: Secondary | ICD-10-CM | POA: Diagnosis not present

## 2016-08-02 ENCOUNTER — Other Ambulatory Visit: Payer: Self-pay | Admitting: *Deleted

## 2016-08-02 MED ORDER — METOPROLOL SUCCINATE ER 100 MG PO TB24: 100.0000 mg | ORAL_TABLET | Freq: Every day | ORAL | 5 refills | Status: DC

## 2016-08-02 NOTE — Telephone Encounter (Signed)
[name] with [facility] called and requested. Stated that the patient has ran out and needs refill faxed to pharmacy.

## 2016-08-03 ENCOUNTER — Other Ambulatory Visit: Payer: Self-pay | Admitting: Internal Medicine

## 2016-08-20 ENCOUNTER — Other Ambulatory Visit: Payer: Self-pay | Admitting: Nurse Practitioner

## 2016-08-31 NOTE — Progress Notes (Signed)
[name] Date of Birth: Feb 15, 1933 Medical Record [id number]  History of Present Illness: [name] is seen for follow up of atrial fibrillation. She initially was diagnosed with Afib in 2013 and converted spontaneously. She had recurrence in Jan 2014 and converted on Flecainide. She has been managed with flecainide and metoprolol. She is also anticoagulated with Eliquis.  She is currently living at [facility]. She notes she falls a lot. Fractured her fibula earlier in the year. States she has bone on bone arthritis in her knees and her legs just give way. She mostly walks with a walker.enjoys drinking Scotch nightly. Was noted to have increasing BP readings and her metoprolol dose was increased with good effect. She would like to take less medication.   Current Outpatient Prescriptions on File Prior to Visit  Medication Sig Dispense Refill  . acetaminophen (TYLENOL) 325 MG tablet Take 650 mg by mouth every 4 (four) hours as needed. For pain    . ELIQUIS 5 MG TABS tablet TAKE 1 TABLET BY MOUTH TWICE DAILY 180 tablet 0  . flecainide (TAMBOCOR) 50 MG tablet TAKE 1 TABLET BY MOUTH TWICE DAILY FOR ATRIAL FIBRILLATION 180 tablet 2  . HYDROcodone-acetaminophen (NORCO/VICODIN) 5-325 MG tablet Take 1 tablet by mouth at bedtime as needed for severe pain. 30 tablet 0  . losartan (COZAAR) 100 MG tablet TAKE 1 TABLET BY MOUTH DAILY 90 tablet 2  . metoprolol succinate (TOPROL-XL) 100 MG 24 hr tablet Take 1 tablet (100 mg total) by mouth daily. Take with or immediately following a meal. 30 tablet 5  . ondansetron (ZOFRAN) 4 MG tablet Take 4 mg by mouth 2 (two) times daily as needed for nausea.    Marland Kitchen Phenylephrine HCl (NEO-SYNEPHRINE NA) Place 1 spray into the nose 2 (two) times daily as needed (congestion).    . sennosides-docusate sodium (SENOKOT-S) 8.6-50 MG tablet Take 1 tablet by mouth 2 (two) times daily as needed for constipation.    . sodium chloride (OCEAN) 0.65 % SOLN nasal spray Place 1  spray into both nostrils daily as needed for congestion.    Marland Kitchen zolpidem (AMBIEN) 10 MG tablet TAKE 1 TABLET BY MOUTH AT BEDTIME AS NEEDED FOR INSOMNIA 30 tablet 0   No current facility-administered medications on file prior to visit.     Allergies  Allergen Reactions  . Mirtazapine     tremor  . Nickel Other (See Comments)    inflammation  . Other Itching    Acrylic nails  . Prednisone     Causes sleep disturbances     Past Medical History:  Diagnosis Date  . A-fib ([place])   . Abdominal pain, unspecified site   . Abnormality of gait 06/02/2013  . Arthritis   . Cataracts, bilateral   . Depression   . Diverticulosis of colon (without mention of hemorrhage)   . Diverticulosis of colon (without mention of hemorrhage) 10/27/2012  . Edema 10/27/2012  . GI bleed 06/2002  . Hemorrhage of gastrointestinal tract, unspecified   . History of stomach ulcers   . Hypertension   . Insomnia, unspecified 10/27/2012  . Localized osteoarthrosis not specified whether primary or secondary, lower leg 05/31/2011  . Migraines   . Nausea alone 10/28/2012  . Obesity, unspecified 10/27/2012  . Osteoarthrosis, unspecified whether generalized or localized, lower leg   . Other acariasis    peripherial neuropathy  . Other and unspecified alcohol dependence, continuous drinking behavior 10/28/2012  . Palpitations   . PVC (premature ventricular contraction)   .  Unspecified hereditary and idiopathic peripheral neuropathy   . UTI (lower urinary tract infection)    pt state uti w/ no pain    Past Surgical History:  Procedure Laterality Date  . ARTHROSCOPIC REPAIR ACL    . BREAST BIOPSY  1973   left  . BREAST REDUCTION SURGERY  12/23/2003  . cataracts    . CHOLECYSTECTOMY  2005  . TONSILLECTOMY AND ADENOIDECTOMY  1941    History  Smoking Status  . Former Smoker  . Types: Cigarettes  . Quit date: 10/09/1983  Smokeless Tobacco  . Never Used    History  Alcohol Use  . 0.0 oz/week  . 1 - 2 Shots of liquor  per week    Comment: daily  scotch     History reviewed. No pertinent family history.  Review of Systems: As noted in history of present illness All other systems were reviewed and are negative.  Physical Exam: BP 126/74   Pulse 70   Ht 5' 7.75" (1.721 m)   Wt 190 lb 9.6 oz (86.5 kg)   LMP  (LMP Unknown)   BMI 29.19 kg/m  She is a pleasant, elderly white female in no acute distress. The patient is alert and oriented x 3.    The skin is warm and dry.    The HEENT exam is normal. There is no JVD.  The lungs are clear.    The heart exam reveals a regular rate with a normal S1 and S2.  There is a grade [id number] diastolic murmur the right upper sternal border.  The PMI is not displaced.   Abdominal exam reveals good bowel sounds.  There is no guarding or rebound.  There is no hepatosplenomegaly or tenderness.  There are no masses.  Exam of the legs reveal no clubbing, cyanosis, or edema.  The legs are without rashes.  The distal pulses are intact.  Cranial nerves II - XII are intact.   LABORATORY DATA: Lab Results  Component Value Date   WBC 7.9 05/09/2016   HGB 14.1 05/09/2016   HCT 41.2 05/09/2016   PLT 217 05/09/2016   GLUCOSE 125 (H) 05/09/2016   CHOL 171 10/03/2015   TRIG 136 10/03/2015   HDL 63 10/03/2015   LDLCALC 81 10/03/2015   ALT 16 05/09/2016   AST 19 05/09/2016   NA 138 05/09/2016   K 3.5 05/09/2016   CL 105 05/09/2016   CREATININE 0.77 05/09/2016   BUN 11 05/09/2016   CO2 23 05/09/2016   TSH 1.215 06/05/2013   INR 1.30 06/05/2013   HGBA1C 5.3 10/03/2015   Ecg today shows NSR with first degree AV block. LVH. Poor R wave progression. I have personally reviewed and interpreted this study.   Assessment / Plan: 1. Atrial fibrillation- maintaining NSR on flecainide. Recommend continue 50 mg bid for maintenance. No documented recurrence since 2014. Explained rationale for continued Eliquis use to reduce risk of CVA. She is on appropriate dose. 82 yo but normal renal  function and body weight > 60 kg.  2. Hypertension, controlled. Continue losartan and Toprol  3. PVCs-  Minimal symptoms.   4. Murmur. Mild AI by prior Echo.  Follow up in 6 months.

## 2016-09-02 ENCOUNTER — Encounter: Payer: Self-pay | Admitting: Cardiology

## 2016-09-02 ENCOUNTER — Ambulatory Visit (INDEPENDENT_AMBULATORY_CARE_PROVIDER_SITE_OTHER): Payer: Medicare Other | Admitting: Cardiology

## 2016-09-02 VITALS — BP 126/74 | HR 70 | Ht 67.75 in | Wt 190.6 lb

## 2016-09-02 DIAGNOSIS — I10 Essential (primary) hypertension: Secondary | ICD-10-CM

## 2016-09-02 DIAGNOSIS — I493 Ventricular premature depolarization: Secondary | ICD-10-CM | POA: Diagnosis not present

## 2016-09-02 DIAGNOSIS — I480 Paroxysmal atrial fibrillation: Secondary | ICD-10-CM | POA: Diagnosis not present

## 2016-09-02 NOTE — Patient Instructions (Signed)
Continue your current therapy  I will see you in one year   

## 2016-09-07 ENCOUNTER — Other Ambulatory Visit: Payer: Self-pay | Admitting: Cardiology

## 2016-09-17 ENCOUNTER — Other Ambulatory Visit: Payer: Self-pay | Admitting: Internal Medicine

## 2016-09-25 ENCOUNTER — Encounter: Payer: Self-pay | Admitting: Internal Medicine

## 2016-09-25 ENCOUNTER — Non-Acute Institutional Stay: Payer: Medicare Other | Admitting: Internal Medicine

## 2016-09-25 VITALS — BP 140/80 | HR 73 | Temp 98.7°F | Wt 191.0 lb

## 2016-09-25 DIAGNOSIS — F1020 Alcohol dependence, uncomplicated: Secondary | ICD-10-CM | POA: Diagnosis not present

## 2016-09-25 DIAGNOSIS — I480 Paroxysmal atrial fibrillation: Secondary | ICD-10-CM | POA: Diagnosis not present

## 2016-09-25 DIAGNOSIS — M62830 Muscle spasm of back: Secondary | ICD-10-CM

## 2016-09-25 DIAGNOSIS — R05 Cough: Secondary | ICD-10-CM

## 2016-09-25 DIAGNOSIS — R0982 Postnasal drip: Secondary | ICD-10-CM

## 2016-09-25 DIAGNOSIS — N3941 Urge incontinence: Secondary | ICD-10-CM | POA: Diagnosis not present

## 2016-09-25 DIAGNOSIS — J309 Allergic rhinitis, unspecified: Secondary | ICD-10-CM | POA: Diagnosis not present

## 2016-09-25 DIAGNOSIS — K5901 Slow transit constipation: Secondary | ICD-10-CM

## 2016-09-25 DIAGNOSIS — R059 Cough, unspecified: Secondary | ICD-10-CM

## 2016-09-25 NOTE — Progress Notes (Signed)
Location:  [facility] of Service:  Clinic (12)  Provider: Austina Constantin L. Mariea Clonts, D.O., C.M.D.  Code Status: DNR Goals of Care:  Advanced Directives 09/25/2016  Does Patient Have a Medical Advance Directive? Yes  Type of Paramedic of [place];Living will;Out of facility DNR (pink MOST or yellow form)  Does patient want to make changes to medical advance directive? -  Copy of [place] in Chart? Yes  Pre-existing out of facility DNR order (yellow form or pink MOST form) Yellow form placed in chart (order not valid for inpatient use)     Chief Complaint  Patient presents with  . Medical Management of Chronic Issues    23mth follow-up    HPI: Patient is a 83 y.o. female seen today for medical management of chronic diseases.    She has a series of teeth with crown.  She has to have a root canal at 3:30pm this afternoon.    No falls lately.  Comes using her lightweight walker. She is being very careful.  Has been behaving and not having caregivers at night.  She got rid of the hospital bed and both she and the cat can fit in the bed.    She is taking 100mg  of toprol xl and it has helped her HR and bp.  Bowels swing back and forth.  If takes senokot, then too loose.  If not, she's impacted.  Miralax was too potent.  Knows she does not get enough fiber.  Discussed the need for hydration with fiber (then she'll have to urinate too much).      Has to get up 5 times at night to pee. She tried the myrbetriq which elevated her bp.    She feels her qol is not like it used to be.    She rarely has an excruciating back spasm.  No longer using hydrocodone regularly.  Last got 30 pills of hydrocodone in Sept.    Also has an irritating cough and postnasal drip.  Has congestion.    Past Medical History:  Diagnosis Date  . A-fib ([place])   . Abdominal pain, unspecified site   . Abnormality of gait 06/02/2013  . Arthritis   .  Cataracts, bilateral   . Depression   . Diverticulosis of colon (without mention of hemorrhage)   . Diverticulosis of colon (without mention of hemorrhage) 10/27/2012  . Edema 10/27/2012  . GI bleed 06/2002  . Hemorrhage of gastrointestinal tract, unspecified   . History of stomach ulcers   . Hypertension   . Insomnia, unspecified 10/27/2012  . Localized osteoarthrosis not specified whether primary or secondary, lower leg 05/31/2011  . Migraines   . Nausea alone 10/28/2012  . Obesity, unspecified 10/27/2012  . Osteoarthrosis, unspecified whether generalized or localized, lower leg   . Other acariasis    peripherial neuropathy  . Other and unspecified alcohol dependence, continuous drinking behavior 10/28/2012  . Palpitations   . PVC (premature ventricular contraction)   . Unspecified hereditary and idiopathic peripheral neuropathy   . UTI (lower urinary tract infection)    pt state uti w/ no pain    Past Surgical History:  Procedure Laterality Date  . ARTHROSCOPIC REPAIR ACL    . BREAST BIOPSY  1973   left  . BREAST REDUCTION SURGERY  12/23/2003  . cataracts    . CHOLECYSTECTOMY  2005  . TONSILLECTOMY AND ADENOIDECTOMY  1941    Allergies  Allergen Reactions  .  Mirtazapine     tremor  . Nickel Other (See Comments)    inflammation  . Other Itching    Acrylic nails  . Prednisone     Causes sleep disturbances     Allergies as of 09/25/2016      Reactions   Mirtazapine    tremor   Nickel Other (See Comments)   inflammation   Other Itching   Acrylic nails   Prednisone    Causes sleep disturbances       Medication List       Accurate as of 09/25/16  1:30 PM. Always use your most recent med list.          acetaminophen 325 MG tablet Commonly known as:  TYLENOL Take 650 mg by mouth every 4 (four) hours as needed. For pain   ELIQUIS 5 MG Tabs tablet Generic drug:  apixaban TAKE 1 TABLET BY MOUTH TWICE DAILY   flecainide 50 MG tablet Commonly known as:  TAMBOCOR TAKE 1  TABLET BY MOUTH TWICE DAILY FOR ATRIAL FIBRILLATION   HYDROcodone-acetaminophen 5-325 MG tablet Commonly known as:  NORCO/VICODIN Take 1 tablet by mouth at bedtime as needed for severe pain.   losartan 100 MG tablet Commonly known as:  COZAAR TAKE 1 TABLET BY MOUTH DAILY   metoprolol succinate 100 MG 24 hr tablet Commonly known as:  TOPROL-XL Take 1 tablet (100 mg total) by mouth daily. Take with or immediately following a meal.   NEO-SYNEPHRINE NA Place 1 spray into the nose 2 (two) times daily as needed (congestion).   ondansetron 4 MG tablet Commonly known as:  ZOFRAN Take 4 mg by mouth 2 (two) times daily as needed for nausea.   sennosides-docusate sodium 8.6-50 MG tablet Commonly known as:  SENOKOT-S Take 1 tablet by mouth 2 (two) times daily as needed for constipation.   sodium chloride 0.65 % Soln nasal spray Commonly known as:  OCEAN Place 1 spray into both nostrils daily as needed for congestion.   zolpidem 10 MG tablet Commonly known as:  AMBIEN TAKE 1 TABLET BY MOUTH AT BEDTIME AS NEEDED FOR INSOMNIA       Review of Systems:  Review of Systems  Constitutional: Negative for chills and fever.  HENT: Positive for congestion. Negative for sinus pain and sore throat.   Respiratory: Positive for cough. Negative for sputum production and shortness of breath.   Cardiovascular: Negative for chest pain and palpitations.  Neurological: Negative for weakness.    Health Maintenance  Topic Date Due  . DEXA SCAN  05/08/2000  . INFLUENZA VACCINE  04/23/2016  . TETANUS/TDAP  09/23/2020  . ZOSTAVAX  Completed  . PNA vac Low Risk Adult  Completed    Physical Exam: Vitals:   09/25/16 1322  BP: 140/80  Pulse: 73  Temp: 98.7 F (37.1 C)  TempSrc: Oral  SpO2: 95%  Weight: 191 lb (86.6 kg)   Body mass index is 29.26 kg/m. Physical Exam  Constitutional: She is oriented to person, place, and time. She appears well-developed and well-nourished. No distress.  HENT:   Head: Normocephalic and atraumatic.  Nose: Nose normal.  Eyes: Conjunctivae are normal.  Neck: Neck supple.  Postnasal drip  Cardiovascular: Regular rhythm, normal heart sounds and intact distal pulses.   Tachy during my exam  Pulmonary/Chest: Effort normal and breath sounds normal. No respiratory distress.  Abdominal: Soft. Bowel sounds are normal.  Musculoskeletal: Normal range of motion.  Lymphadenopathy:    She has no cervical adenopathy.  Neurological: She is alert and oriented to person, place, and time. No cranial nerve deficit.  Skin: Skin is warm and dry. Capillary refill takes less than 2 seconds.  Psychiatric: She has a normal mood and affect.    Labs reviewed: Basic Metabolic Panel:  Recent Labs  12/04/15 1619 04/27/16 1256 05/09/16 0926  NA 136 140 138  K 4.0 3.9 3.5  CL 101 106 105  CO2 24 24 23   GLUCOSE 130* 95 125*  BUN 12 17 11   CREATININE 0.76 0.76 0.77  CALCIUM 9.0 9.0 8.8*   Liver Function Tests:  Recent Labs  10/03/15 04/27/16 1256 05/09/16 0926  AST 16 15 19   ALT 10 13* 16  ALKPHOS 64 89 69  BILITOT  --  1.0 1.1  PROT  --  7.0 7.0  ALBUMIN  --  4.4 4.3   No results for input(s): LIPASE, AMYLASE in the last 8760 hours. No results for input(s): AMMONIA in the last 8760 hours. CBC:  Recent Labs  12/04/15 1619 04/27/16 1256 05/09/16 0926  WBC 6.1 6.7 7.9  NEUTROABS 4.6 4.5 6.4  HGB 14.1 14.3 14.1  HCT 41.5 43.1 41.2  MCV 94.1 95.4 93.8  PLT 218 215 217   Lipid Panel:  Recent Labs  10/03/15  CHOL 171  HDL 63  LDLCALC 81  TRIG 136   Lab Results  Component Value Date   HGBA1C 5.3 10/03/2015    Assessment/Plan 1. Cough - due to #2 -counseled on use of nettipot to try to help with this--she agrees to try daily for the next week  2. Allergic rhinitis with postnasal drip -ongoing  -uses saline nasal spray, rare phenylephrine (has been counseled on avoiding regular use) -see #1  3. Paroxysmal atrial fibrillation  (HCC) -cont toprol xl daily at the 100mg --bp improved and HR on this  4. Lumbar paraspinal muscle spasm -doing better, happens rarely now  5. Alcohol dependence, continuous ([place]) -ongoing, but has been more responsible to prevent falls that have led to rehab stays and need for more caregivers in her home which she does not like  6. Urge incontinence of urine -did not do well with mybetriq due to bp going up -not a good candidate for other agents due to anticholinergic effects with her all risk  7. Slow transit constipation -recommended fiber supplement but she also was reminded to hydrate which she does not want to do with #6 -advised not to use the fiber supplement UNLESS she does drink water or she'll end up impacted  Labs/tests ordered:  Cbc, cmp, flp before Next appt:  01/29/2017 annual wellness with labs before  [place] L. Kypton Eltringham, D.O. [facility] [address], [place] [address] Cell Phone (Mon-Fri 8am-5pm):  [phone] On Call:  [phone] & follow prompts after 5pm & weekends Office Phone:  [phone] Office Fax:  [phone]

## 2016-10-07 ENCOUNTER — Other Ambulatory Visit: Payer: Self-pay | Admitting: Internal Medicine

## 2016-10-16 ENCOUNTER — Other Ambulatory Visit: Payer: Self-pay | Admitting: Internal Medicine

## 2016-11-13 ENCOUNTER — Other Ambulatory Visit: Payer: Self-pay | Admitting: Internal Medicine

## 2016-11-14 DIAGNOSIS — B351 Tinea unguium: Secondary | ICD-10-CM | POA: Diagnosis not present

## 2016-11-15 ENCOUNTER — Other Ambulatory Visit: Payer: Self-pay | Admitting: *Deleted

## 2016-11-15 MED ORDER — ZOLPIDEM TARTRATE 10 MG PO TABS: ORAL_TABLET | ORAL | 0 refills | Status: DC

## 2016-11-15 NOTE — Telephone Encounter (Signed)
Kim with [facility] Nurse called and stated that patient is calling her requesting a refill on her Ambien. I printed Rx and placed for Dr. Mariea Clonts to sign. To fax to [facility]. Patient notified.

## 2016-12-09 ENCOUNTER — Telehealth: Payer: Self-pay | Admitting: *Deleted

## 2016-12-09 NOTE — Telephone Encounter (Signed)
Manuela Schwartz from [facility] calling stating that pt is having several symptoms and she's not sure what she's missing. Vitals are good 96.7, 97% p56 and bp after taking meds 156/90, pt complaining of being achey and nausea, lungs clear, per pt had pale yellow sputum last week, per susan not eating well, pt doesn't eat breakfast, pt thinks she's declining and thinks she may need a mood stabilizer? Please advise. No appts available at [facility]

## 2016-12-09 NOTE — Telephone Encounter (Signed)
Let's try her on zoloft 25mg  po daily.

## 2016-12-10 MED ORDER — ONDANSETRON HCL 4 MG PO TABS: 4.0000 mg | ORAL_TABLET | Freq: Two times a day (BID) | ORAL | 0 refills | Status: DC | PRN

## 2016-12-10 MED ORDER — SERTRALINE HCL 25 MG PO TABS: 25.0000 mg | ORAL_TABLET | Freq: Every day | ORAL | 3 refills | Status: DC

## 2016-12-10 NOTE — Telephone Encounter (Signed)
Spoke with patient and advised results, rx sent to pharmacy by e-script  

## 2016-12-18 ENCOUNTER — Encounter: Payer: Self-pay | Admitting: Internal Medicine

## 2016-12-18 ENCOUNTER — Non-Acute Institutional Stay: Payer: Medicare Other | Admitting: Internal Medicine

## 2016-12-18 VITALS — BP 140/80 | HR 79 | Temp 98.5°F | Wt 193.0 lb

## 2016-12-18 DIAGNOSIS — R05 Cough: Secondary | ICD-10-CM | POA: Diagnosis not present

## 2016-12-18 DIAGNOSIS — R110 Nausea: Secondary | ICD-10-CM | POA: Diagnosis not present

## 2016-12-18 DIAGNOSIS — K219 Gastro-esophageal reflux disease without esophagitis: Secondary | ICD-10-CM | POA: Diagnosis not present

## 2016-12-18 DIAGNOSIS — R059 Cough, unspecified: Secondary | ICD-10-CM

## 2016-12-18 DIAGNOSIS — F339 Major depressive disorder, recurrent, unspecified: Secondary | ICD-10-CM | POA: Diagnosis not present

## 2016-12-18 MED ORDER — SERTRALINE HCL 25 MG PO TABS: 50.0000 mg | ORAL_TABLET | Freq: Every day | ORAL | 3 refills | Status: DC

## 2016-12-18 MED ORDER — OMEPRAZOLE 20 MG PO CPDR: 20.0000 mg | DELAYED_RELEASE_CAPSULE | Freq: Every day | ORAL | 1 refills | Status: DC

## 2016-12-18 NOTE — Progress Notes (Signed)
Location:  [place] of Service:  Clinic (12)  Provider: Mingo Siegert L. Mariea Clonts, D.O., C.M.D.  Code Status: DNR Goals of Care:  Advanced Directives 12/18/2016  Does Patient Have a Medical Advance Directive? Yes  Type of Paramedic of [place];Living will;Out of facility DNR (pink MOST or yellow form)  Does patient want to make changes to medical advance directive? -  Copy of [place] in Chart? Yes  Pre-existing out of facility DNR order (yellow form or pink MOST form) Yellow form placed in chart (order not valid for inpatient use)     Chief Complaint  Patient presents with  . Acute Visit    nausea, fatigue, cough    HPI: Patient is a 83 y.o. female seen today for an acute visit for nausea, fatigue, & cough.   C/o paroxysmal cough for years, but hasn't been this aggravating. She has coughing fits and sneezing fits. Has thin, watery nasal drainage. The clinic nurse checked her last week and her lungs were clear. Has not improved. No problems with seasonal allergies. Is taking flonase with no relief. No sore throat, sinus pressure. She can pinpoint the tickle in her throat. No relationship to time of day. No fevers. Feels some SOB, due to not moving around much.   Has chronic nausea. Has 2 types of nausea: 1-comes every few weeks, is systemic, sweeps over her, impending doom, turns cold; 2- feeling sick, hungry, stomach growling. This kind of nausea is # 2. Now, she is nauseated, but it is not overwelming. Does not actually vomit, but is close especially with coughing. This has been going on in the last month. Her cat had to have a radioisotope therapy, and wondering if that may have caused her recent nausea. Is taking zofran, does not help. Is only taking once a day. Nausea is worse in the morning. The reason is at 5 pm she allows herself 2 alcoholic beverages, scotch and that helps it. May mostly be due to the selzter in the ETOH  beverage? Has no abdominal pain associated with it. She does have constipation, takes senna. Will have diarrhea if she takes more than a quarter. Hasn't taken in a few days, b/c loose stools. Now currently is constipated.   Feels fatigue, due to feeling weighed down with feeling bad, including symptoms of nausea, fatigue, coughing , dripping from nose.   Was recently started on Zoloft 25 mg daily. She took it in past when she was menopausal and was on 100 mg daily. Has only been on it for 1 week. Too early to really know if it will help.   Past Medical History:  Diagnosis Date  . A-fib ([place])   . Abdominal pain, unspecified site   . Abnormality of gait 06/02/2013  . Arthritis   . Cataracts, bilateral   . Depression   . Diverticulosis of colon (without mention of hemorrhage)   . Diverticulosis of colon (without mention of hemorrhage) 10/27/2012  . Edema 10/27/2012  . GI bleed 06/2002  . Hemorrhage of gastrointestinal tract, unspecified   . History of stomach ulcers   . Hypertension   . Insomnia, unspecified 10/27/2012  . Localized osteoarthrosis not specified whether primary or secondary, lower leg 05/31/2011  . Migraines   . Nausea alone 10/28/2012  . Obesity, unspecified 10/27/2012  . Osteoarthrosis, unspecified whether generalized or localized, lower leg   . Other acariasis    peripherial neuropathy  . Other and unspecified  alcohol dependence, continuous drinking behavior 10/28/2012  . Palpitations   . PVC (premature ventricular contraction)   . Unspecified hereditary and idiopathic peripheral neuropathy   . UTI (lower urinary tract infection)    pt state uti w/ no pain    Past Surgical History:  Procedure Laterality Date  . ARTHROSCOPIC REPAIR ACL    . BREAST BIOPSY  1973   left  . BREAST REDUCTION SURGERY  12/23/2003  . cataracts    . CHOLECYSTECTOMY  2005  . TONSILLECTOMY AND ADENOIDECTOMY  1941    Allergies  Allergen Reactions  . Mirtazapine     tremor  . Nickel Other (See  Comments)    inflammation  . Other Itching    Acrylic nails  . Prednisone     Causes sleep disturbances     Allergies as of 12/18/2016      Reactions   Mirtazapine    tremor   Nickel Other (See Comments)   inflammation   Other Itching   Acrylic nails   Prednisone    Causes sleep disturbances       Medication List       Accurate as of 12/18/16  3:38 PM. Always use your most recent med list.          acetaminophen 325 MG tablet Commonly known as:  TYLENOL Take 650 mg by mouth every 4 (four) hours as needed. For pain   ELIQUIS 5 MG Tabs tablet Generic drug:  apixaban TAKE 1 TABLET BY MOUTH TWICE DAILY   flecainide 50 MG tablet Commonly known as:  TAMBOCOR TAKE 1 TABLET BY MOUTH TWICE DAILY FOR ATRIAL FIBRILLATION   HYDROcodone-acetaminophen 5-325 MG tablet Commonly known as:  NORCO/VICODIN Take 1 tablet by mouth at bedtime as needed for severe pain.   losartan 100 MG tablet Commonly known as:  COZAAR TAKE 1 TABLET BY MOUTH DAILY   metoprolol succinate 100 MG 24 hr tablet Commonly known as:  TOPROL-XL Take 1 tablet (100 mg total) by mouth daily. Take with or immediately following a meal.   NEO-SYNEPHRINE NA Place 1 spray into the nose 2 (two) times daily as needed (congestion).   ondansetron 4 MG tablet Commonly known as:  ZOFRAN Take 1 tablet (4 mg total) by mouth 2 (two) times daily as needed for nausea.   sennosides-docusate sodium 8.6-50 MG tablet Commonly known as:  SENOKOT-S Take 1 tablet by mouth 2 (two) times daily as needed for constipation.   sertraline 25 MG tablet Commonly known as:  ZOLOFT Take 1 tablet (25 mg total) by mouth daily.   sodium chloride 0.65 % Soln nasal spray Commonly known as:  OCEAN Place 1 spray into both nostrils daily as needed for congestion.   zolpidem 10 MG tablet Commonly known as:  AMBIEN Take one tablet by mouth at bedtime as needed for insomnia       Review of Systems:  Review of Systems  Constitutional:  Negative for chills, fever and malaise/fatigue.  HENT: Negative for congestion and hearing loss.   Eyes: Negative for blurred vision.  Respiratory: Negative for cough and shortness of breath.   Cardiovascular: Negative for chest pain, palpitations and leg swelling.  Gastrointestinal: Positive for heartburn and nausea. Negative for abdominal pain, blood in stool, constipation, diarrhea, melena and vomiting.  Genitourinary: Negative for dysuria.  Musculoskeletal: Negative for falls.  Skin: Negative for itching and rash.  Neurological: Positive for dizziness. Negative for loss of consciousness and weakness.  Endo/Heme/Allergies: Does not bruise/bleed easily.  Psychiatric/Behavioral: Positive for depression. Negative for memory loss and suicidal ideas. The patient is nervous/anxious. The patient does not have insomnia.     Health Maintenance  Topic Date Due  . DEXA SCAN  05/08/2000  . TETANUS/TDAP  09/23/2020  . INFLUENZA VACCINE  Completed  . PNA vac Low Risk Adult  Completed    Physical Exam: Vitals:   12/18/16 1532  BP: 140/80  Pulse: 79  Temp: 98.5 F (36.9 C)  TempSrc: Oral  SpO2: 95%  Weight: 193 lb (87.5 kg)   Body mass index is 29.56 kg/m. Physical Exam  Constitutional: She is oriented to person, place, and time. She appears well-developed and well-nourished. No distress.  HENT:  Mouth/Throat: Oropharynx is clear and moist. No oropharyngeal exudate.  A little postnasal drip  Cardiovascular: Normal rate, regular rhythm, normal heart sounds and intact distal pulses.   Pulmonary/Chest: Effort normal and breath sounds normal. No respiratory distress.  Abdominal: Soft. Bowel sounds are normal. She exhibits no distension.  Musculoskeletal: Normal range of motion.  Walks with rollator walker  Neurological: She is alert and oriented to person, place, and time.  Skin: Skin is warm and dry.  Psychiatric: She has a normal mood and affect.    Labs reviewed: Basic Metabolic  Panel:  Recent Labs  04/27/16 1256 05/09/16 0926  NA 140 138  K 3.9 3.5  CL 106 105  CO2 24 23  GLUCOSE 95 125*  BUN 17 11  CREATININE 0.76 0.77  CALCIUM 9.0 8.8*   Liver Function Tests:  Recent Labs  04/27/16 1256 05/09/16 0926  AST 15 19  ALT 13* 16  ALKPHOS 89 69  BILITOT 1.0 1.1  PROT 7.0 7.0  ALBUMIN 4.4 4.3   No results for input(s): LIPASE, AMYLASE in the last 8760 hours. No results for input(s): AMMONIA in the last 8760 hours. CBC:  Recent Labs  04/27/16 1256 05/09/16 0926  WBC 6.7 7.9  NEUTROABS 4.5 6.4  HGB 14.3 14.1  HCT 43.1 41.2  MCV 95.4 93.8  PLT 215 217   Lipid Panel: No results for input(s): CHOL, HDL, LDLCALC, TRIG, CHOLHDL, LDLDIRECT in the last 8760 hours. Lab Results  Component Value Date   HGBA1C 5.3 10/03/2015   Assessment/Plan 1. Gastroesophageal reflux disease, esophagitis presence not specified - suspect this is the cause for both her nausea and her cough - will start her on prilosec before her evening meal  - omeprazole (PRILOSEC) 20 MG capsule; Take 1 capsule (20 mg total) by mouth daily before supper.  Dispense: 30 capsule; Refill: 1  2. Depression, recurrent ([place]) -increase zoloft to 50mg  daily and f/u in 6 wks  3. Nausea -seems a mix of anxiety and GERD  4. Cough -seems due to gerd, will tx with ppi x 6 wks and reassess  Labs/tests ordered:  No new Next appt:  01/29/2017  Javeria Briski L. Iyani Dresner, D.O. [facility] [address], [place] [address] Cell Phone (Mon-Fri 8am-5pm):  [phone] On Call:  [phone] & follow prompts after 5pm & weekends Office Phone:  [phone] Office Fax:  [phone]

## 2016-12-26 ENCOUNTER — Other Ambulatory Visit: Payer: Self-pay | Admitting: Internal Medicine

## 2016-12-27 ENCOUNTER — Ambulatory Visit (INDEPENDENT_AMBULATORY_CARE_PROVIDER_SITE_OTHER): Payer: Medicare Other | Admitting: Physician Assistant

## 2016-12-27 DIAGNOSIS — M170 Bilateral primary osteoarthritis of knee: Secondary | ICD-10-CM | POA: Diagnosis not present

## 2016-12-27 MED ORDER — HYDROCODONE-ACETAMINOPHEN 5-325 MG PO TABS: 1.0000 | ORAL_TABLET | ORAL | 0 refills | Status: DC | PRN

## 2016-12-27 MED ORDER — LIDOCAINE HCL 1 % IJ SOLN
1.0000 mL | INTRAMUSCULAR | Status: AC | PRN
  Administered 2016-12-27: 1 mL

## 2016-12-27 MED ORDER — LIDOCAINE HCL 1 % IJ SOLN
3.0000 mL | INTRAMUSCULAR | Status: AC | PRN
  Administered 2016-12-27: 3 mL

## 2016-12-27 MED ORDER — METHYLPREDNISOLONE ACETATE 40 MG/ML IJ SUSP
40.0000 mg | INTRAMUSCULAR | Status: AC | PRN
  Administered 2016-12-27: 40 mg via INTRA_ARTICULAR

## 2016-12-27 MED ORDER — METHYLPREDNISOLONE ACETATE 40 MG/ML IJ SUSP
40.0000 mg | INTRAMUSCULAR | Status: AC | PRN
Start: 2016-12-27 — End: 2016-12-27
  Administered 2016-12-27: 40 mg via INTRA_ARTICULAR

## 2016-12-27 NOTE — Progress Notes (Signed)
Office Visit Note   Patient: [name]           Date of Birth: 05/18/1933           MRN: [id number] Visit Date: 12/27/2016              Requested by: Gayland Curry, DO [place], [place] [address] PCP: Hollace Kinnier, DO   Assessment & Plan: Visit Diagnoses:  1. Primary osteoarthritis of both knees     Plan: She'll follow up with [place] on an as-needed basis for knee injections.  Follow-Up Instructions: Return if symptoms worsen or fail to improve.   Orders:  No orders of the defined types were placed in this encounter.  Meds ordered this encounter  Medications  . HYDROcodone-acetaminophen (NORCO/VICODIN) 5-325 MG tablet    Sig: Take 1-2 tablets by mouth every 4 (four) hours as needed for severe pain.    Dispense:  30 tablet    Refill:  0      Procedures: Large Joint Inj Date/Time: 12/27/2016 11:46 AM Performed by: Pete Pelt Authorized by: Pete Pelt   Consent Given by:  Patient Indications:  Pain Location:  Knee Site:  L knee Needle Size:  22 G Approach:  Anterolateral Ultrasound Guidance: No   Fluoroscopic Guidance: No   Medications:  1 mL lidocaine 1 %; 3 mL lidocaine 1 %; 40 mg methylPREDNISolone acetate 40 MG/ML Aspiration Attempted: No   Patient tolerance:  Patient tolerated the procedure well with no immediate complications Large Joint Inj Date/Time: 12/27/2016 11:46 AM Performed by: Pete Pelt Authorized by: Pete Pelt   Consent Given by:  Patient Indications:  Pain Location:  Knee Site:  R knee Needle Size:  22 G Approach:  Anterolateral Ultrasound Guidance: No   Fluoroscopic Guidance: No   Medications:  40 mg methylPREDNISolone acetate 40 MG/ML; 3 mL lidocaine 1 % Aspiration Attempted: No   Patient tolerance:  Patient tolerated the procedure well with no immediate complications     Clinical Data: No additional findings.   Subjective: Bilateral knee pain  HPI The saline is well-known Dr. Trevor Mace  service has known tricompartmental arthritis both knees. She is requesting injection in both knees. Last injections in both knees with cortisone was 10 of 2017. No new injuries   Review of Systems   Objective: Vital Signs: LMP  (LMP Unknown)   Physical Exam Alert and oriented Ortho Exam bilateral knees overall good range of motion. She has tenderness about the knees with palpation both medial lateral joint lines. No instability. No effusion no abnormal warmth no erythema of either knee.          Specialty Comments:  No specialty comments available.  Imaging: No results found.   PMFS History: Patient Active Problem List   Diagnosis Date Noted  . Lumbar paraspinal muscle spasm 05/20/2016  . Pruritus 11/08/2013  . Hx of peptic ulcer 06/13/2013  . Hyperglycemia 06/07/2013  . DJD (degenerative joint disease) 06/07/2013  . Hypokalemia 06/06/2013  . Abnormality of gait 06/02/2013  . UTI (urinary tract infection) 05/21/2013  . Unintentional weight loss 03/29/2013  . Cough 03/29/2013  . Tremor 01/11/2013  . Nausea without vomiting 01/11/2013  . Depression   . Alcohol dependence, continuous ([place]) 10/28/2012  . Insomnia 10/27/2012  . A-fib ([place])   . PVC (premature ventricular contraction)   . Hypertension 10/08/2012   Past Medical History:  Diagnosis Date  . A-fib ([place])   . Abdominal  pain, unspecified site   . Abnormality of gait 06/02/2013  . Arthritis   . Cataracts, bilateral   . Depression   . Diverticulosis of colon (without mention of hemorrhage)   . Diverticulosis of colon (without mention of hemorrhage) 10/27/2012  . Edema 10/27/2012  . GI bleed 06/2002  . Hemorrhage of gastrointestinal tract, unspecified   . History of stomach ulcers   . Hypertension   . Insomnia, unspecified 10/27/2012  . Localized osteoarthrosis not specified whether primary or secondary, lower leg 05/31/2011  . Migraines   . Nausea alone 10/28/2012  . Obesity, unspecified 10/27/2012  .  Osteoarthrosis, unspecified whether generalized or localized, lower leg   . Other acariasis    peripherial neuropathy  . Other and unspecified alcohol dependence, continuous drinking behavior 10/28/2012  . Palpitations   . PVC (premature ventricular contraction)   . Unspecified hereditary and idiopathic peripheral neuropathy   . UTI (lower urinary tract infection)    pt state uti w/ no pain    No family history on file.  Past Surgical History:  Procedure Laterality Date  . ARTHROSCOPIC REPAIR ACL    . BREAST BIOPSY  1973   left  . BREAST REDUCTION SURGERY  12/23/2003  . cataracts    . CHOLECYSTECTOMY  2005  . TONSILLECTOMY AND ADENOIDECTOMY  1941   Social History   Occupational History  . retired Marine scientist    Social History Main Topics  . Smoking status: Former Smoker    Types: Cigarettes    Quit date: 10/09/1983  . Smokeless tobacco: Never Used  . Alcohol use 0.0 oz/week    1 - 2 Shots of liquor per week     Comment: daily  scotch   . Drug use: No  . Sexual activity: No

## 2016-12-27 NOTE — Progress Notes (Signed)
The saline is well-known Dr. Trevor Mace service has known tricompartmental arthritis both knees. She is requesting injection in both knees. Last injections in both knees with cortisone was 10 of 2017. No new injuries.  Physical exam bilateral knees overall good range of motion. She has tenderness about the knees with palpation both medial lateral joint lines. No instability. No effusion no abnormal warmth no erythema of either knee.

## 2016-12-30 ENCOUNTER — Other Ambulatory Visit: Payer: Self-pay | Admitting: *Deleted

## 2016-12-30 ENCOUNTER — Other Ambulatory Visit: Payer: Self-pay | Admitting: Internal Medicine

## 2016-12-30 MED ORDER — SERTRALINE HCL 25 MG PO TABS: 50.0000 mg | ORAL_TABLET | Freq: Every day | ORAL | 3 refills | Status: DC

## 2016-12-30 NOTE — Telephone Encounter (Signed)
Patient called and stated that Dr. Mariea Clonts increased her Sertraline to 50mg  at last appointment but didn't call in new Rx and she needs medication. I reviewed last OV and faxed Rx to pharmacy.

## 2017-01-11 ENCOUNTER — Other Ambulatory Visit: Payer: Self-pay | Admitting: Internal Medicine

## 2017-01-15 ENCOUNTER — Other Ambulatory Visit: Payer: Self-pay | Admitting: Internal Medicine

## 2017-01-15 DIAGNOSIS — K219 Gastro-esophageal reflux disease without esophagitis: Secondary | ICD-10-CM

## 2017-01-21 ENCOUNTER — Encounter: Payer: Self-pay | Admitting: Internal Medicine

## 2017-01-21 LAB — HEPATIC FUNCTION PANEL
ALT: 14 U/L (ref 7–35)
AST: 17 U/L (ref 13–35)
Alkaline Phosphatase: 104 U/L (ref 25–125)
Bilirubin, Total: 0.4 mg/dL

## 2017-01-21 LAB — BASIC METABOLIC PANEL
BUN: 13 mg/dL (ref 4–21)
Creatinine: 0.9 mg/dL (ref 0.5–1.1)
Glucose: 114 mg/dL
Potassium: 4.1 mmol/L (ref 3.4–5.3)
Sodium: 142 mmol/L (ref 137–147)

## 2017-01-21 LAB — CBC AND DIFFERENTIAL
HEMATOCRIT: 48 % — AB (ref 36–46)
HEMOGLOBIN: 15.4 g/dL (ref 12.0–16.0)
PLATELETS: 230 10*3/uL (ref 150–399)
WBC: 8.2 10^3/mL

## 2017-01-21 LAB — LIPID PANEL
CHOLESTEROL: 224 mg/dL — AB (ref 0–200)
HDL: 97 mg/dL — AB (ref 35–70)
LDL CALC: 90 mg/dL
TRIGLYCERIDES: 186 mg/dL — AB (ref 40–160)

## 2017-01-23 ENCOUNTER — Other Ambulatory Visit: Payer: Self-pay | Admitting: Internal Medicine

## 2017-01-29 ENCOUNTER — Non-Acute Institutional Stay: Payer: Medicare Other | Admitting: Internal Medicine

## 2017-01-29 ENCOUNTER — Encounter: Payer: Self-pay | Admitting: Internal Medicine

## 2017-01-29 VITALS — BP 112/70 | HR 76 | Temp 97.8°F | Wt 188.0 lb

## 2017-01-29 DIAGNOSIS — J309 Allergic rhinitis, unspecified: Secondary | ICD-10-CM | POA: Diagnosis not present

## 2017-01-29 DIAGNOSIS — R0982 Postnasal drip: Secondary | ICD-10-CM | POA: Diagnosis not present

## 2017-01-29 DIAGNOSIS — F339 Major depressive disorder, recurrent, unspecified: Secondary | ICD-10-CM

## 2017-01-29 DIAGNOSIS — N3941 Urge incontinence: Secondary | ICD-10-CM | POA: Diagnosis not present

## 2017-01-29 DIAGNOSIS — R05 Cough: Secondary | ICD-10-CM

## 2017-01-29 DIAGNOSIS — R053 Chronic cough: Secondary | ICD-10-CM

## 2017-01-29 DIAGNOSIS — K219 Gastro-esophageal reflux disease without esophagitis: Secondary | ICD-10-CM | POA: Diagnosis not present

## 2017-01-29 DIAGNOSIS — Z0000 Encounter for general adult medical examination without abnormal findings: Secondary | ICD-10-CM | POA: Diagnosis not present

## 2017-01-29 MED ORDER — BENZONATATE 100 MG PO CAPS: 100.0000 mg | ORAL_CAPSULE | Freq: Two times a day (BID) | ORAL | 5 refills | Status: DC | PRN

## 2017-01-29 NOTE — Progress Notes (Signed)
Location:  Occupational psychologist of Service:  Clinic (12) Provider: Maddux Vanscyoc L. Mariea Clonts, D.O., C.M.D.  Patient Care Team: Gayland Curry, DO as PCP - General (Geriatric Medicine) Martinique, Peter M, MD as Attending Physician (Cardiology) [facility] Rosezella [place], MD as Consulting Physician (Neurology)  Extended Emergency Contact Information Primary Emergency Contact: Darden [place] Phone: [phone] Mobile Phone: [phone] Relation: Relative Secondary Emergency Contact: [place] Work Phone: [phone] Mobile Phone: [phone] Relation: None  Code Status: DNR Goals of Care: Advanced Directive information Advanced Directives 12/18/2016  Does Patient Have a Medical Advance Directive? Yes  Type of Paramedic of [place];Living will;Out of facility DNR (pink MOST or yellow form)  Does patient want to make changes to medical advance directive? -  Copy of [place] in Chart? Yes  Pre-existing out of facility DNR order (yellow form or pink MOST form) Yellow form placed in chart (order not valid for inpatient use)     Chief Complaint  Patient presents with  . Annual Exam    wellness exam  . MMSE    30/30 passed clock    HPI: Patient is a 83 y.o. female seen in today for an annual wellness exam.    Has cough that's nonproductive.  Recovered from her bronchitis and wheezing.   BP well controlled on losartan.  Takes her omeprazole before when she remembers--1-2x per week.  Doesn't notice a difference if she misses it.    She says she's not sure if zoloft is helping, but her spirits do seem improved to me.  She's joking more and her usual sarcastic self.    Had bilateral knee injections and got 30 hydrocodone and still has more than 20 left.    Depression screen [facility] 2/9 01/29/2017 12/18/2016 09/25/2016 05/08/2016 04/10/2016  Decreased  Interest 0 0 0 0 0  Down, Depressed, Hopeless 0 0 0 0 0  PHQ - 2 Score 0 0 0 0 0    Fall Risk  01/29/2017 12/18/2016 09/25/2016 05/08/2016 04/10/2016  Falls in the past year? No No No Yes Yes  Number falls in past yr: - - - 1 2 or more  Injury with Fall? - - - Yes Yes  Risk for fall due to : - - - - -  Follow up - - - - -   MMSE - [place] Exam 01/29/2017 10/04/2015  Orientation to time 5 5  Orientation to Place 5 5  Registration 3 3  Attention/ Calculation 5 5  Recall 3 3  Language- name 2 objects 2 2  Language- repeat 1 1  Language- follow 3 step command 3 3  Language- read & follow direction 1 1  Write a sentence 1 1  Copy design 1 1  Total score 30 30  passed clock   Health Maintenance  Topic Date Due  . DEXA SCAN  05/08/2000  . INFLUENZA VACCINE  04/23/2017  . TETANUS/TDAP  09/23/2020  . PNA vac Low Risk Adult  Completed    Functional Status Survey:        Diet? No exam data present Hearing: Dentition: Pain:  Past Medical History:  Diagnosis Date  . A-fib ([place])   . Abdominal pain, unspecified site   . Abnormality of gait 06/02/2013  . Arthritis   . Cataracts, bilateral   . Depression   . Diverticulosis of colon (without mention of hemorrhage)   .  Diverticulosis of colon (without mention of hemorrhage) 10/27/2012  . Edema 10/27/2012  . GI bleed 06/2002  . Hemorrhage of gastrointestinal tract, unspecified   . History of stomach ulcers   . Hypertension   . Insomnia, unspecified 10/27/2012  . Localized osteoarthrosis not specified whether primary or secondary, lower leg 05/31/2011  . Migraines   . Nausea alone 10/28/2012  . Obesity, unspecified 10/27/2012  . Osteoarthrosis, unspecified whether generalized or localized, lower leg   . Other acariasis    peripherial neuropathy  . Other and unspecified alcohol dependence, continuous drinking behavior 10/28/2012  . Palpitations   . PVC (premature ventricular contraction)   . Unspecified hereditary and idiopathic  peripheral neuropathy   . UTI (lower urinary tract infection)    pt state uti w/ no pain    Past Surgical History:  Procedure Laterality Date  . ARTHROSCOPIC REPAIR ACL    . BREAST BIOPSY  1973   left  . BREAST REDUCTION SURGERY  12/23/2003  . cataracts    . CHOLECYSTECTOMY  2005  . TONSILLECTOMY AND ADENOIDECTOMY  1941    No family history on file.  Social History   Social History  . Marital status: Widowed    Spouse name: N/A  . Number of children: N/A  . Years of education: N/A   Occupational History  . retired Marine scientist    Social History Main Topics  . Smoking status: Former Smoker    Types: Cigarettes    Quit date: 10/09/1983  . Smokeless tobacco: Never Used  . Alcohol use 0.0 oz/week    1 - 2 Shots of liquor per week     Comment: daily  scotch   . Drug use: No  . Sexual activity: No   Other Topics Concern  . None   Social History Narrative   Lives at [facility], in [place]   Widow -2004   Stopped smoking 1985   POA, DNR, Living Will   [place] with walker   Alcohol scotch daily    Exercise none    reports that she quit smoking about 33 years ago. Her smoking use included Cigarettes. She has never used smokeless tobacco. She reports that she drinks alcohol. She reports that she does not use drugs.  Allergies as of 01/29/2017      Reactions   Mirtazapine    tremor   Nickel Other (See Comments)   inflammation   Other Itching   Acrylic nails   Prednisone    Causes sleep disturbances       Medication List       Accurate as of 01/29/17  2:50 PM. Always use your most recent med list.          acetaminophen 325 MG tablet Commonly known as:  TYLENOL Take 650 mg by mouth every 4 (four) hours as needed. For pain   ELIQUIS 5 MG Tabs tablet Generic drug:  apixaban TAKE 1 TABLET BY MOUTH TWICE DAILY   flecainide 50 MG tablet Commonly known as:  TAMBOCOR TAKE 1 TABLET BY MOUTH TWICE DAILY FOR ATRIAL FIBRILLATION   HYDROcodone-acetaminophen 5-325 MG  tablet Commonly known as:  NORCO/VICODIN Take 1 tablet by mouth as needed for moderate pain.   losartan 100 MG tablet Commonly known as:  COZAAR TAKE 1 TABLET BY MOUTH DAILY   metoprolol succinate 100 MG 24 hr tablet Commonly known as:  TOPROL-XL Take 1 tablet (100 mg total) by mouth daily. Take with or immediately following a meal.   NEO-SYNEPHRINE NA  Place 1 spray into the nose 2 (two) times daily as needed (congestion).   omeprazole 20 MG capsule Commonly known as:  PRILOSEC TAKE 1 CAPSULE(20 MG) BY MOUTH DAILY BEFORE SUPPER   ondansetron 4 MG tablet Commonly known as:  ZOFRAN Take 1 tablet (4 mg total) by mouth 2 (two) times daily as needed for nausea.   sennosides-docusate sodium 8.6-50 MG tablet Commonly known as:  SENOKOT-S Take 1 tablet by mouth 2 (two) times daily as needed for constipation.   sertraline 25 MG tablet Commonly known as:  ZOLOFT TAKE 2 TABLETS(50 MG) BY MOUTH DAILY   sodium chloride 0.65 % Soln nasal spray Commonly known as:  OCEAN Place 1 spray into both nostrils daily as needed for congestion.   zolpidem 10 MG tablet Commonly known as:  AMBIEN TAKE 1 TABLET BY MOUTH AT BEDTIME AS NEEDED FOR SLEEP       Review of Systems:  Review of Systems  Constitutional: Positive for malaise/fatigue. Negative for chills and fever.  HENT: Positive for hearing loss. Negative for congestion.   Eyes: Negative for blurred vision.  Respiratory: Negative for shortness of breath.   Cardiovascular: Negative for chest pain, palpitations and leg swelling.  Gastrointestinal: Negative for abdominal pain, blood in stool, constipation, diarrhea, heartburn, melena, nausea and vomiting.  Genitourinary: Positive for frequency and urgency. Negative for dysuria.  Musculoskeletal: Positive for back pain. Negative for falls and myalgias.  Skin: Negative for itching and rash.  Neurological: Negative for dizziness, loss of consciousness and weakness.  Endo/Heme/Allergies:  Does not bruise/bleed easily.  Psychiatric/Behavioral: Positive for depression. Negative for memory loss. The patient is not nervous/anxious.        Spirits seem improved on zoloft    Physical Exam: Vitals:   01/29/17 1428  BP: 112/70  Pulse: 76  Temp: 97.8 F (36.6 C)  TempSrc: Oral  SpO2: 95%  Weight: 188 lb (85.3 kg)   Body mass index is 28.8 kg/m. Physical Exam  Constitutional: She is oriented to person, place, and time. She appears well-developed and well-nourished. No distress.  HENT:  Head: Normocephalic and atraumatic.  Cardiovascular: Normal rate, regular rhythm, normal heart sounds and intact distal pulses.   Pulmonary/Chest: Effort normal and breath sounds normal. No respiratory distress. She has no wheezes.  Musculoskeletal: Normal range of motion.  Walking with lightweight folding walker  Neurological: She is alert and oriented to person, place, and time.  Skin: Skin is warm and dry.  Psychiatric: She has a normal mood and affect.    Labs reviewed: Basic Metabolic Panel:  Recent Labs  04/27/16 1256 05/09/16 0926 01/21/17  NA 140 138 142  K 3.9 3.5 4.1  CL 106 105  --   CO2 24 23  --   GLUCOSE 95 125*  --   BUN 17 11 13   CREATININE 0.76 0.77 0.9  CALCIUM 9.0 8.8*  --    Liver Function Tests:  Recent Labs  04/27/16 1256 05/09/16 0926 01/21/17  AST 15 19 17   ALT 13* 16 14  ALKPHOS 89 69 104  BILITOT 1.0 1.1  --   PROT 7.0 7.0  --   ALBUMIN 4.4 4.3  --    No results for input(s): LIPASE, AMYLASE in the last 8760 hours. No results for input(s): AMMONIA in the last 8760 hours. CBC:  Recent Labs  04/27/16 1256 05/09/16 0926 01/21/17  WBC 6.7 7.9 8.2  NEUTROABS 4.5 6.4  --   HGB 14.3 14.1 15.4  HCT 43.1 41.2  48*  MCV 95.4 93.8  --   PLT 215 217 230   Lipid Panel:  Recent Labs  01/21/17  CHOL 224*  HDL 97*  LDLCALC 90  TRIG 186*   Lab Results  Component Value Date   HGBA1C 5.3 10/03/2015    Assessment/Plan 1. Chronic  cough - not improving with PPI but did not c/o nausea today like she usually does, not just during allergy season -multifactorial - benzonatate (TESSALON) 100 MG capsule; Take 1 capsule (100 mg total) by mouth 2 (two) times daily as needed for cough.  Dispense: 60 capsule; Refill: 5  2. Gastroesophageal reflux disease, esophagitis presence not specified -cont omeprazole -still drinking alcohol in evening which is likely contributing  3. Depression, recurrent ([place]) -cont zoloft therapy which seems to have helped her some  4. Allergic rhinitis with postnasal drip -ongoing, allergy meds don't interact well with her bp meds and scotch  5. Urge incontinence of urine -did not have benefit with myrbetriq so stopped  6. Medicare annual wellness visit, subsequent -performed today as above   Labs/tests ordered:  No new Next appt:  6 mos med mgt   Trude Cansler L. Zakkary Thibault, D.O. [facility] [address], [place] [address] Cell Phone (Mon-Fri 8am-5pm):  [phone] On Call:  [phone] & follow prompts after 5pm & weekends Office Phone:  [phone] Office Fax:  [phone]

## 2017-02-10 ENCOUNTER — Telehealth: Payer: Self-pay | Admitting: Cardiology

## 2017-02-10 NOTE — Telephone Encounter (Signed)
New message       Pt c/o BP issue: STAT if pt c/o blurred vision, one-sided weakness or slurred speech  1. What are your last 5 BP readings?  Sat 238/102, 158/92 Sunday.  Nurse at [facility] took bp.  Pt has not had bp taken this am  2. Are you having any other symptoms (ex. Dizziness, headache, blurred vision, passed out)? Swelling in feet and slight headache  3. What is your BP issue?  bp was high over weekend and pt want to be seen.  I offered appt at 3:30 with Eulas Post but she could not come.  Please advise

## 2017-02-10 NOTE — Telephone Encounter (Signed)
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 May be advisable to have nurse at [facility] monitor BP for a few days to a week to see trends to determine if med changes are warranted.

## 2017-02-11 NOTE — Telephone Encounter (Signed)
Agree with advice. Monitor BP for now. See how she responds to Zoloft.  Lashanda Storlie [place] MD, [facility]

## 2017-02-12 NOTE — Telephone Encounter (Signed)
Returned call to patient.Dr.Jordan's advice given.Stated she just don't feel right.Sob with exertion.Stated she would like to be seen.Appointment scheduled with Almyra Deforest PA 02/14/17 at 1:30 pm.

## 2017-02-14 ENCOUNTER — Telehealth: Payer: Self-pay | Admitting: Physician Assistant

## 2017-02-14 ENCOUNTER — Encounter: Payer: Self-pay | Admitting: Physician Assistant

## 2017-02-14 ENCOUNTER — Ambulatory Visit (INDEPENDENT_AMBULATORY_CARE_PROVIDER_SITE_OTHER): Payer: Medicare Other | Admitting: Physician Assistant

## 2017-02-14 VITALS — BP 124/74 | HR 76 | Ht 67.5 in | Wt 190.0 lb

## 2017-02-14 DIAGNOSIS — I10 Essential (primary) hypertension: Secondary | ICD-10-CM | POA: Diagnosis not present

## 2017-02-14 DIAGNOSIS — I480 Paroxysmal atrial fibrillation: Secondary | ICD-10-CM

## 2017-02-14 DIAGNOSIS — Z79899 Other long term (current) drug therapy: Secondary | ICD-10-CM

## 2017-02-14 DIAGNOSIS — R0602 Shortness of breath: Secondary | ICD-10-CM

## 2017-02-14 DIAGNOSIS — R600 Localized edema: Secondary | ICD-10-CM | POA: Diagnosis not present

## 2017-02-14 MED ORDER — FUROSEMIDE 20 MG PO TABS: 20.0000 mg | ORAL_TABLET | ORAL | 6 refills | Status: DC | PRN

## 2017-02-14 NOTE — Patient Instructions (Addendum)
Avoid salt Limit daily fluid intake to less than 2 liters Weigh yourself every morning, sign of fluid accumulation include sudden increase in weight > 3 lbs overnight or 5 lbs in a single week.  Start 20mg  daily lasix on 02/17/2017 for 3 days, obtain BMET lab on 02/20/2017. Then take lasix on an as-needed basis.  Your physician has requested that you have an echocardiogram. Echocardiography is a painless test that uses sound waves to create images of your heart. It provides your doctor with information about the size and shape of your heart and how well your heart's chambers and valves are working. This procedure takes approximately one hour. There are no restrictions for this procedure.  Your physician recommends that you schedule a follow-up appointment in: 2-3 Weeks with [name]  Your physician recommends that you schedule a follow-up appointment in: 2-3 Months with [name]

## 2017-02-14 NOTE — Progress Notes (Signed)
Cardiology Office Note    Date:  02/14/2017   ID:  Dao, Mearns 1932-10-06, MRN [id number]  PCP:  Gayland Curry, DO  Cardiologist:  Dr. Martinique  Chief Complaint  Patient presents with  . Follow-up    seen for Dr. Martinique  . Edema  . Shortness of Breath  . Headache    History of Present Illness:  [name] is a 83 y.o. female with PMH of hypertension, obesity, palpitation, PVC, mild AI and history of atrial fibrillation. She was initially diagnosed with atrial fibrillation in 2013 and converted spontaneously. She had a recurrence in January 2014 and converted on flecainide. She has since been taking flecainide, eliquis and metoprolol. Her last echocardiogram obtained on 10/16/2012 showed EF 21-19%, grade 1 diastolic dysfunction, moderate LVH, mild AI. She is currently living in [facility]. Her last office visit was on 09/02/2016, she has been maintaining sinus rhythm on the current dose of flecainide. Based on telephone note is documented on 02/10/2017, she has been having mild edema in her feet, shakiness, nausea and had a mild headache. Her primary care provider thought might be anxiety and gave her some Zoloft. She continued to experience episodes of spike in her blood pressure and a feeling of malaise. She was placed on a schedule and advised to follow-up on her blood pressure.  Her blood pressure was elevated at greater than 200 several weeks back. Since then, her BP has normalized. Today's EKG showed normal sinus rhythm. On physical exam, she also has 2+ pitting edema in bilateral lower extremity. It is unclear to me what exactly caused her to have volume accumulation. She denies any recent chest pain. She does occasional have acid reflux symptoms at night however never with exertion. She has baseline shortness of breath with exertion. I recommended recheck an echocardiogram. I also started her on an as needed dose of Lasix. She was started on 20 mg daily of Lasix for 3 days  starting next Monday, then obtain basic metabolic panel next Thursday before resuming Lasix on an as needed basis. I will see her back in 2-3 weeks for further evaluation. If she has reaccumulation of fluid, I may decide to put her on a more scheduled Lasix dose. She also has a chronic dry cough, which has been going on for several years and has not changed despite switching from ACE inhibitor to ARB.   Past Medical History:  Diagnosis Date  . A-fib ([place])   . Abdominal pain, unspecified site   . Abnormality of gait 06/02/2013  . Arthritis   . Cataracts, bilateral   . Depression   . Diverticulosis of colon (without mention of hemorrhage)   . Diverticulosis of colon (without mention of hemorrhage) 10/27/2012  . Edema 10/27/2012  . GI bleed 06/2002  . Hemorrhage of gastrointestinal tract, unspecified   . History of stomach ulcers   . Hypertension   . Insomnia, unspecified 10/27/2012  . Localized osteoarthrosis not specified whether primary or secondary, lower leg 05/31/2011  . Migraines   . Nausea alone 10/28/2012  . Obesity, unspecified 10/27/2012  . Osteoarthrosis, unspecified whether generalized or localized, lower leg   . Other acariasis    peripherial neuropathy  . Other and unspecified alcohol dependence, continuous drinking behavior 10/28/2012  . Palpitations   . PVC (premature ventricular contraction)   . Unspecified hereditary and idiopathic peripheral neuropathy   . UTI (lower urinary tract infection)    pt state uti w/ no pain  Past Surgical History:  Procedure Laterality Date  . ARTHROSCOPIC REPAIR ACL    . BREAST BIOPSY  1973   left  . BREAST REDUCTION SURGERY  12/23/2003  . cataracts    . CHOLECYSTECTOMY  2005  . TONSILLECTOMY AND ADENOIDECTOMY  1941    Current Medications: Outpatient Medications Prior to Visit  Medication Sig Dispense Refill  . acetaminophen (TYLENOL) 325 MG tablet Take 650 mg by mouth every 4 (four) hours as needed. For pain    . benzonatate (TESSALON)  100 MG capsule Take 1 capsule (100 mg total) by mouth 2 (two) times daily as needed for cough. 60 capsule 5  . ELIQUIS 5 MG TABS tablet TAKE 1 TABLET BY MOUTH TWICE DAILY 180 tablet 1  . flecainide (TAMBOCOR) 50 MG tablet TAKE 1 TABLET BY MOUTH TWICE DAILY FOR ATRIAL FIBRILLATION 180 tablet 2  . HYDROcodone-acetaminophen (NORCO/VICODIN) 5-325 MG tablet Take 1 tablet by mouth as needed for moderate pain.    Marland Kitchen losartan (COZAAR) 100 MG tablet TAKE 1 TABLET BY MOUTH DAILY 90 tablet 2  . metoprolol succinate (TOPROL-XL) 100 MG 24 hr tablet Take 1 tablet (100 mg total) by mouth daily. Take with or immediately following a meal. 30 tablet 5  . ondansetron (ZOFRAN) 4 MG tablet Take 1 tablet (4 mg total) by mouth 2 (two) times daily as needed for nausea. 20 tablet 0  . Phenylephrine HCl (NEO-SYNEPHRINE NA) Place 1 spray into the nose 2 (two) times daily as needed (congestion).    . sennosides-docusate sodium (SENOKOT-S) 8.6-50 MG tablet Take 1 tablet by mouth 2 (two) times daily as needed for constipation.    . sertraline (ZOLOFT) 25 MG tablet TAKE 2 TABLETS(50 MG) BY MOUTH DAILY 180 tablet 3  . sodium chloride (OCEAN) 0.65 % SOLN nasal spray Place 1 spray into both nostrils daily as needed for congestion.    Marland Kitchen zolpidem (AMBIEN) 10 MG tablet TAKE 1 TABLET BY MOUTH AT BEDTIME AS NEEDED FOR SLEEP 30 tablet 0  . omeprazole (PRILOSEC) 20 MG capsule TAKE 1 CAPSULE(20 MG) BY MOUTH DAILY BEFORE SUPPER 90 capsule 1   No facility-administered medications prior to visit.      Allergies:   Mirtazapine; Nickel; Other; and Prednisone   Social History   Social History  . Marital status: Widowed    Spouse name: N/A  . Number of children: N/A  . Years of education: N/A   Occupational History  . retired Marine scientist    Social History Main Topics  . Smoking status: Former Smoker    Types: Cigarettes    Quit date: 10/09/1983  . Smokeless tobacco: Never Used  . Alcohol use 0.0 oz/week    1 - 2 Shots of liquor per week      Comment: daily  scotch   . Drug use: No  . Sexual activity: No   Other Topics Concern  . None   Social History Narrative   Lives at [facility], in [place]   Widow -2004   Stopped smoking [place], DNR, Living Will   Darling with walker   Alcohol scotch daily    Exercise none     Family History:  The patient's family history is not on file.   ROS:   Please see the history of present illness.    ROS All other systems reviewed and are negative.   PHYSICAL EXAM:   VS:  BP 124/74   Pulse 76   Ht 5' 7.5" (1.715 m)  Wt 190 lb (86.2 kg)   LMP  (LMP Unknown)   BMI 29.32 kg/m    GEN: Well nourished, well developed, in no acute distress  HEENT: normal  Neck: no JVD, carotid bruits, or masses Cardiac: RRR; no murmurs, rubs, or gallops. 2+ LE edema  Respiratory:  clear to auscultation bilaterally, normal work of breathing GI: soft, nontender, nondistended, + BS MS: no deformity or atrophy  Skin: warm and dry, no rash Neuro:  Alert and Oriented x 3, Strength and sensation are intact Psych: euthymic mood, full affect  Wt Readings from Last 3 Encounters:  02/14/17 190 lb (86.2 kg)  01/29/17 188 lb (85.3 kg)  12/18/16 193 lb (87.5 kg)      Studies/Labs Reviewed:   EKG:  EKG is ordered today.  The ekg ordered today demonstrates Normal sinus rhythm without significant ST-T wave changes. Single-lead T-wave inversion in lead 3, flattening of T waves in aVF, not significant, unchanged when compared to the previous EKG.  Recent Labs: 01/21/2017: ALT 14; BUN 13; Creatinine 0.9; Hemoglobin 15.4; Platelets 230; Potassium 4.1; Sodium 142   Lipid Panel    Component Value Date/Time   CHOL 224 (A) 01/21/2017   TRIG 186 (A) 01/21/2017   HDL 97 (A) 01/21/2017   Tubac 90 01/21/2017    Additional studies/ records that were reviewed today include:   Echo 10/16/2012 LV EF: 55% -  60%  Study Conclusions  - Left ventricle: The cavity size was normal. Wall thickness was  increased in a pattern of moderate LVH. Systolic function was normal. The estimated ejection fraction was in the range of 55% to 60%. Wall motion was normal; there were no regional wall motion abnormalities. Doppler parameters are consistent with abnormal left ventricular relaxation (grade 1 diastolic dysfunction). - Aortic valve: Mild regurgitation. - Left atrium: The atrium was mildly to moderately dilated.   ASSESSMENT:    1. SOB (shortness of breath) on exertion   2. Medication management   3. Lower extremity edema   4. Essential hypertension   5. PAF (paroxysmal atrial fibrillation) (HCC)      PLAN:  In order of problems listed above:  1. LE edema: Noticeable bilateral lower extremity edema, will add Lasix 20 mg on as-needed basis. She will take 20 mg daily for 3 days before switching to PRN dosing. She will have a basic metabolic panel at [facility] next Thursday and have the results faxed to [place]. I will see her back in month to see how she does.  2. Dyspnea on exertion: Given recent lower extremity edema, plan for outpatient echocardiogram. If EF is low, then will need further ischemic workup. Otherwise, she has no chest pain.  3. PAF on eliquis: CHA2DS2-Vasc score 5 (female, age, HTN, HF). No recurrence, continue metoprolol XL. No obvious bleeding issues.  4. Hypertension: Blood pressure very well-controlled 124/74 despite the recent blood pressure spike.    Medication Adjustments/Labs and Tests Ordered: Current medicines are reviewed at length with the patient today.  Concerns regarding medicines are outlined above.  Medication changes, Labs and Tests ordered today are listed in the Patient Instructions below. Patient Instructions  Avoid salt Limit daily fluid intake to less than 2 liters Weigh yourself every morning, sign of fluid accumulation include sudden increase in weight > 3 lbs overnight or 5 lbs in a single week.  Start 20mg  daily lasix on 02/17/2017  for 3 days, obtain BMET lab on 02/20/2017. Then take lasix on an as-needed basis.  Your  physician has requested that you have an echocardiogram. Echocardiography is a painless test that uses sound waves to create images of your heart. It provides your doctor with information about the size and shape of your heart and how well your heart's chambers and valves are working. This procedure takes approximately one hour. There are no restrictions for this procedure.  Your physician recommends that you schedule a follow-up appointment in: 2-3 Weeks with Desiree Hane  Your physician recommends that you schedule a follow-up appointment in: 2-3 Months with Dr Martinique      Signed, [place], [place]  02/14/2017 7:27 PM    [address], [place], [place]  [address] Phone: [phone]; Fax: [phone]

## 2017-02-14 NOTE — Telephone Encounter (Signed)
New message    [name] from [facility] [address] is calling to find out the frequency of pt furosemide.

## 2017-02-14 NOTE — Telephone Encounter (Signed)
Per OV 5/25: Start 20mg  daily lasix on 02/17/2017 for 3 days, obtain BMET lab on 02/20/2017. Then take lasix on an as-needed basis.  Notified pharmacy staff.

## 2017-02-18 ENCOUNTER — Telehealth: Payer: Self-pay

## 2017-02-18 MED ORDER — CHLORTHALIDONE 25 MG PO TABS: 25.0000 mg | ORAL_TABLET | Freq: Every day | ORAL | 6 refills | Status: DC

## 2017-02-18 NOTE — Telephone Encounter (Signed)
Spoke to [place] and patient at [facility] recommendation given.[name] will do a bmet and u/a in 2 weeks.She will send results to office.

## 2017-02-18 NOTE — Telephone Encounter (Signed)
Brunetta Genera D at 02/18/2017 3:06 PM   Status: Signed    Mliss Sax is calling because Mrs. Ashburn has elevated Blood pressures , also  Mrs. Drue Flirt is asking can they include a Urinalysis in the lab work . Please call      Spoke to [place] at [facility].She stated patient checked her B/P this morning with her electronic B/P monitor 200/104.Bernadette rechecked with manuel B/P monitor 168/82,160/82 pulse 82.Patient continues to feel lousy.She wanted to ask Dr.Jordan if he wanted to increase one of her B/P medications.Message sent to [place] for advice.

## 2017-02-18 NOTE — Telephone Encounter (Signed)
I would recommend starting chlorthalidone 25 mg daily in addition to her current meds. Needs repeat BMET in 1-2 weeks. OK if they want to check a UA.   Clovis Warwick Martinique MD, [facility]

## 2017-02-20 ENCOUNTER — Encounter: Payer: Self-pay | Admitting: Internal Medicine

## 2017-02-20 DIAGNOSIS — Z79899 Other long term (current) drug therapy: Secondary | ICD-10-CM | POA: Diagnosis not present

## 2017-02-20 DIAGNOSIS — R3915 Urgency of urination: Secondary | ICD-10-CM | POA: Diagnosis not present

## 2017-02-20 DIAGNOSIS — N390 Urinary tract infection, site not specified: Secondary | ICD-10-CM | POA: Diagnosis not present

## 2017-02-20 DIAGNOSIS — D649 Anemia, unspecified: Secondary | ICD-10-CM | POA: Diagnosis not present

## 2017-02-20 DIAGNOSIS — R319 Hematuria, unspecified: Secondary | ICD-10-CM | POA: Diagnosis not present

## 2017-02-20 LAB — BASIC METABOLIC PANEL
BUN: 14 mg/dL (ref 4–21)
BUN: 14 mg/dL (ref 4–21)
CREATININE: 0.9 mg/dL (ref 0.5–1.1)
Creatinine: 0.9 mg/dL (ref 0.5–1.1)
Glucose: 99 mg/dL
Glucose: 99 mg/dL
POTASSIUM: 3.9 mmol/L (ref 3.4–5.3)
Potassium: 3.9 mmol/L (ref 3.4–5.3)
Sodium: 140 mmol/L (ref 137–147)
Sodium: 140 mmol/L (ref 137–147)

## 2017-02-21 ENCOUNTER — Encounter: Payer: Self-pay | Admitting: *Deleted

## 2017-02-24 ENCOUNTER — Other Ambulatory Visit: Payer: Self-pay | Admitting: Internal Medicine

## 2017-02-24 ENCOUNTER — Telehealth: Payer: Self-pay | Admitting: *Deleted

## 2017-02-24 MED ORDER — CIPROFLOXACIN HCL 500 MG PO TABS: 500.0000 mg | ORAL_TABLET | Freq: Two times a day (BID) | ORAL | 0 refills | Status: AC

## 2017-02-24 NOTE — Telephone Encounter (Signed)
Manuela Schwartz, nurse with [facility] called and stated that patient has been calling them wondering what to do regarding her U/A Culture. Patient stated that she was not feeling good.   I called patient and she stated that she has insisted that they do a urine. She stated that she has night sweats, nausea and shaking. Has Urine frequency and burning when urinates. Wears a pull up at night because she can't make it to the bathroom.  Suspects she has fever due to the sweats. Stated that her BP at times has been over 200, but yesterday it was normal.  Stated that she has taken about 6 tablets of Lasix last week and her swelling has gone down quite a bit. Please Advise.  ([facility] labs placed in Dr. Cyndi Lennert folder to review).

## 2017-02-24 NOTE — Telephone Encounter (Signed)
Tomorrow is ok

## 2017-02-24 NOTE — Telephone Encounter (Signed)
Hard to know w/o actual temperature checks if she really has a UTI.  She's been nauseous off and on forever so that's not helpful.  She may be having frequency and urgency from taking so much lasix.  I recommend we do get an I/O cath UA c+s with proper technique, cbc with diff and cmp drawn.  She needs to hydrate especially if she's been taking the lasix for swelling.  Was her weight up when she took the lasix?

## 2017-02-24 NOTE — Telephone Encounter (Signed)
Spoke with susan @ [facility] and advised results, she will get the UA C&S but wanted to know if the labs need to be done stat or be done tomorrow. Please advise

## 2017-02-24 NOTE — Telephone Encounter (Signed)
Spoke with susan again and sent rx to pharmacy per Dr. Mariea Clonts , 1G of rocephin ordered now for UTI. Per Dr. Mariea Clonts no labs, this was done on 02/20/2017.

## 2017-02-28 ENCOUNTER — Ambulatory Visit (HOSPITAL_COMMUNITY): Payer: Medicare Other | Attending: Cardiovascular Disease

## 2017-02-28 ENCOUNTER — Other Ambulatory Visit: Payer: Self-pay

## 2017-02-28 DIAGNOSIS — R0602 Shortness of breath: Secondary | ICD-10-CM

## 2017-03-04 ENCOUNTER — Other Ambulatory Visit: Payer: Self-pay | Admitting: Internal Medicine

## 2017-03-05 ENCOUNTER — Encounter: Payer: Self-pay | Admitting: Physician Assistant

## 2017-03-05 ENCOUNTER — Ambulatory Visit (INDEPENDENT_AMBULATORY_CARE_PROVIDER_SITE_OTHER): Payer: Medicare Other | Admitting: Physician Assistant

## 2017-03-05 VITALS — BP 125/68 | HR 56 | Ht 67.5 in | Wt 186.4 lb

## 2017-03-05 DIAGNOSIS — R001 Bradycardia, unspecified: Secondary | ICD-10-CM

## 2017-03-05 DIAGNOSIS — I5032 Chronic diastolic (congestive) heart failure: Secondary | ICD-10-CM | POA: Diagnosis not present

## 2017-03-05 DIAGNOSIS — Z79899 Other long term (current) drug therapy: Secondary | ICD-10-CM

## 2017-03-05 DIAGNOSIS — I480 Paroxysmal atrial fibrillation: Secondary | ICD-10-CM

## 2017-03-05 DIAGNOSIS — I10 Essential (primary) hypertension: Secondary | ICD-10-CM | POA: Diagnosis not present

## 2017-03-05 MED ORDER — CHLORTHALIDONE 25 MG PO TABS: 25.0000 mg | ORAL_TABLET | Freq: Every day | ORAL | 6 refills | Status: DC | PRN

## 2017-03-05 NOTE — Patient Instructions (Signed)
Medication Instructions:   TAKE the hygroton (chlorthalidone) AS NEEDED for SYSTOLIC BLOOD PRESSURE above 160 (top number)  Labwork:   BMET today   Testing/Procedures:   Follow-Up:  As scheduled with Dr. Martinique  Check your BPs twice a day and bring the readings to your next visit.  If you need a refill on your cardiac medications before your next appointment, please call your pharmacy.

## 2017-03-05 NOTE — Progress Notes (Signed)
Cardiology Office Note    Date:  03/05/2017   ID:  [name], DOB [birth year], MRN [id number]  PCP:  Gayland Curry, DO  Cardiologist:  Dr. Martinique  Chief Complaint  Patient presents with  . Follow-up    seen for Dr. Martinique.     History of Present Illness:  [name] is a 83 y.o. female with PMH of hypertension, obesity, palpitation, PVC, mild AI and history of atrial fibrillation. She was initially diagnosed with atrial fibrillation in 2013 and converted spontaneously. She had a recurrence in January 2014 and converted on flecainide. She has since been taking flecainide, eliquis and metoprolol. Her last echocardiogram obtained on 10/16/2012 showed EF 59-16%, grade 1 diastolic dysfunction, moderate LVH, mild AI. She is currently living in [facility]. Her last office visit was on 09/02/2016, she has been maintaining sinus rhythm on the current dose of flecainide. Based on telephone note is documented on 02/10/2017, she has been having mild edema in her feet, shakiness, nausea and had a mild headache. Her primary care provider thought might be anxiety and gave her some Zoloft. She continued to experience episodes of spike in her blood pressure and a feeling of malaise. She was placed on a schedule and advised to follow-up on her blood pressure.  By the time I saw her on 02/14/2017, her blood pressure has normalized. However she had 2+ pedal edema in bilateral lower extremity on physical exam. I started her on Lasix 20 mg daily for 3 days before going down to as needed. Echocardiogram was done on 02/28/2017, EF was 55-60%, mild-to-moderate AR, grade 1 diastolic dysfunction. Since last office visit, she was also started on chlorthalidone for systolic blood pressure greater than 200. She presented today for follow-up, she says she has been feeling quite poorly after started on chlorthalidone. She is worried it is dropping her blood pressure too much and wished to use chlorthalidone as needed basis.  She says she also feels her head has been heavy but is not sure if she has any significant dizziness. Given possible sign of hypotension recently, I am okay with holding chlorthalidone and use it on as needed basis for systolic blood pressure greater than 160. Her heart rate is also in the 50s today, although I don't think it is severe enough to be responsible to recent symptom. However if her heart rate dipped down below into the 40s, I would be more inclined to reduce the Toprol-XL to 50 mg daily instead. Given the fact that she is on as needed dose of Lasix and also chlorthalidone recently, I will recheck a basic metabolic panel to make sure her renal function is will stable.   Past Medical History:  Diagnosis Date  . A-fib ([place])   . Abdominal pain, unspecified site   . Abnormality of gait 06/02/2013  . Arthritis   . Cataracts, bilateral   . Depression   . Diverticulosis of colon (without mention of hemorrhage)   . Diverticulosis of colon (without mention of hemorrhage) 10/27/2012  . Edema 10/27/2012  . GI bleed 06/2002  . Hemorrhage of gastrointestinal tract, unspecified   . History of stomach ulcers   . Hypertension   . Insomnia, unspecified 10/27/2012  . Localized osteoarthrosis not specified whether primary or secondary, lower leg 05/31/2011  . Migraines   . Nausea alone 10/28/2012  . Obesity, unspecified 10/27/2012  . Osteoarthrosis, unspecified whether generalized or localized, lower leg   . Other acariasis    peripherial neuropathy  .  Other and unspecified alcohol dependence, continuous drinking behavior 10/28/2012  . Palpitations   . PVC (premature ventricular contraction)   . Unspecified hereditary and idiopathic peripheral neuropathy   . UTI (lower urinary tract infection)    pt state uti w/ no pain    Past Surgical History:  Procedure Laterality Date  . ARTHROSCOPIC REPAIR ACL    . BREAST BIOPSY  1973   left  . BREAST REDUCTION SURGERY  12/23/2003  . cataracts    .  CHOLECYSTECTOMY  2005  . TONSILLECTOMY AND ADENOIDECTOMY  1941    Current Medications: Outpatient Medications Prior to Visit  Medication Sig Dispense Refill  . acetaminophen (TYLENOL) 325 MG tablet Take 650 mg by mouth every 4 (four) hours as needed. For pain    . benzonatate (TESSALON) 100 MG capsule Take 1 capsule (100 mg total) by mouth 2 (two) times daily as needed for cough. 60 capsule 5  . ELIQUIS 5 MG TABS tablet TAKE 1 TABLET BY MOUTH TWICE DAILY 180 tablet 1  . flecainide (TAMBOCOR) 50 MG tablet TAKE 1 TABLET BY MOUTH TWICE DAILY FOR ATRIAL FIBRILLATION 180 tablet 2  . furosemide (LASIX) 20 MG tablet Take 1 tablet (20 mg total) by mouth as needed. 30 tablet 6  . HYDROcodone-acetaminophen (NORCO/VICODIN) 5-325 MG tablet Take 1 tablet by mouth as needed for moderate pain.    Marland Kitchen losartan (COZAAR) 100 MG tablet TAKE 1 TABLET BY MOUTH DAILY 90 tablet 2  . metoprolol succinate (TOPROL-XL) 100 MG 24 hr tablet TAKE 1 TABLET BY MOUTH DAILY, WITH OR IMMEDIATELY FOLLOWING A MEAL 90 tablet 2  . ondansetron (ZOFRAN) 4 MG tablet Take 1 tablet (4 mg total) by mouth 2 (two) times daily as needed for nausea. 20 tablet 0  . Phenylephrine HCl (NEO-SYNEPHRINE NA) Place 1 spray into the nose 2 (two) times daily as needed (congestion).    . sennosides-docusate sodium (SENOKOT-S) 8.6-50 MG tablet Take 1 tablet by mouth 2 (two) times daily as needed for constipation.    . sertraline (ZOLOFT) 25 MG tablet TAKE 2 TABLETS(50 MG) BY MOUTH DAILY 180 tablet 3  . sodium chloride (OCEAN) 0.65 % SOLN nasal spray Place 1 spray into both nostrils daily as needed for congestion.    Marland Kitchen zolpidem (AMBIEN) 10 MG tablet TAKE 1 TABLET BY MOUTH AT BEDTIME AS NEEDED FOR SLEEP 30 tablet 0  . chlorthalidone (HYGROTON) 25 MG tablet Take 1 tablet (25 mg total) by mouth daily. 30 tablet 6   No facility-administered medications prior to visit.      Allergies:   Mirtazapine; Nickel; Other; and Prednisone   Social History   Social  History  . Marital status: Widowed    Spouse name: N/A  . Number of children: N/A  . Years of education: N/A   Occupational History  . retired Marine scientist    Social History Main Topics  . Smoking status: Former Smoker    Types: Cigarettes    Quit date: 10/09/1983  . Smokeless tobacco: Never Used  . Alcohol use 0.0 oz/week    1 - 2 Shots of liquor per week     Comment: daily  scotch   . Drug use: No  . Sexual activity: No   Other Topics Concern  . None   Social History Narrative   Lives at [facility], in [place]   Widow -2004   Stopped smoking 1985   POA, DNR, Living Will   [place] with walker   Alcohol scotch daily  Exercise none     Family History:  The patient's family history is not on file.   ROS:   Please see the history of present illness.    ROS All other systems reviewed and are negative.   PHYSICAL EXAM:   VS:  BP 125/68   Pulse (!) 56   Ht 5' 7.5" (1.715 m)   Wt 186 lb 6.4 oz (84.6 kg)   LMP  (LMP Unknown)   SpO2 97%   BMI 28.76 kg/m    GEN: Well nourished, well developed, in no acute distress  HEENT: normal  Neck: no JVD, carotid bruits, or masses Cardiac: RRR; no murmurs, rubs, or gallops,no edema  Respiratory:  clear to auscultation bilaterally, normal work of breathing GI: soft, nontender, nondistended, + BS MS: no deformity or atrophy  Skin: warm and dry, no rash Neuro:  Alert and Oriented x 3, Strength and sensation are intact Psych: euthymic mood, full affect  Wt Readings from Last 3 Encounters:  03/05/17 186 lb 6.4 oz (84.6 kg)  02/14/17 190 lb (86.2 kg)  01/29/17 188 lb (85.3 kg)      Studies/Labs Reviewed:   EKG:  EKG is ordered today.  The ekg ordered today demonstrates Sinus bradycardia, heart rate 53, right bundle branch block. Unchanged compared to the previous EKG.  Recent Labs: 01/21/2017: ALT 14; Hemoglobin 15.4; Platelets 230 02/20/2017: BUN 14; Creatinine 0.9; Potassium 3.9; Sodium 140   Lipid Panel    Component Value  Date/Time   CHOL 224 (A) 01/21/2017   TRIG 186 (A) 01/21/2017   HDL 97 (A) 01/21/2017   [place] 90 01/21/2017    Additional studies/ records that were reviewed today include:   Echo 02/28/2017 LV EF: 55% -   60%  Study Conclusions  - Left ventricle: The cavity size was normal. Wall thickness was   increased in a pattern of moderate LVH. There was focal basal   hypertrophy. Systolic function was normal. The estimated ejection   fraction was in the range of 55% to 60%. Wall motion was normal;   there were no regional wall motion abnormalities. Doppler   parameters are consistent with abnormal left ventricular   relaxation (grade 1 diastolic dysfunction). - Aortic valve: Mildly calcified annulus. There was mild to   moderate regurgitation. - Mitral valve: Valve area by pressure half-time: 1.88 cm^2.   ASSESSMENT:    1. Paroxysmal atrial fibrillation (HCC)   2. Encounter for long-term (current) use of medications   3. Essential hypertension   4. Chronic diastolic heart failure ([place])   5. Bradycardia      PLAN:  In order of problems listed above:  1. Paroxysmal atrial fibrillation: Currently in sinus rhythm. Continue flecainide and Toprol-XL. She will continue on eliquis at this time. No obvious sign of bleeding.  2. Bradycardia: Her heart rate is in the 50s today. I don't think it is low enough to cause any symptom at this time. If her heart rate does decreased into the 40s, then I would recommend decrease Toprol-XL to 50 mg daily.  3. Chronic diastolic heart failure: I started her on 20 mg Lasix 3 before going down to 20 mg is on an as-needed basis. Her lower extremity edema has largely resolved by this point. She says after she completed 3 days of Lasix, she has been using the Lasix every other day instead. She did not use any Lasix for the past 2 days. I will check a basic metabolic panel.  4. Hypertension:  She was started on chlorthalidone recently for systolic blood  pressure of over 200, her blood pressure is normal today. She says she has been having some weakness and also feel her head is heavy. She wishes to change chlorthalidone to PRN instead, I will change it to as needed for systolic blood pressure greater than 160.    Medication Adjustments/Labs and Tests Ordered: Current medicines are reviewed at length with the patient today.  Concerns regarding medicines are outlined above.  Medication changes, Labs and Tests ordered today are listed in the Patient Instructions below. Patient Instructions  Medication Instructions:   TAKE the hygroton (chlorthalidone) AS NEEDED for SYSTOLIC BLOOD PRESSURE above 160 (top number)  Labwork:   BMET today   Testing/Procedures:   Follow-Up:  As scheduled with Dr. Martinique  Check your BPs twice a day and bring the readings to your next visit.  If you need a refill on your cardiac medications before your next appointment, please call your pharmacy.      Hilbert Corrigan, [place]  03/05/2017 11:44 PM    [facility] [address], [place], [place]  [address] Phone: [phone]; Fax: [phone]

## 2017-03-06 LAB — BASIC METABOLIC PANEL
BUN/Creatinine Ratio: 14 (ref 12–28)
BUN: 13 mg/dL (ref 8–27)
CHLORIDE: 82 mmol/L — AB (ref 96–106)
CO2: 26 mmol/L (ref 20–29)
Calcium: 9 mg/dL (ref 8.7–10.3)
Creatinine, Ser: 0.96 mg/dL (ref 0.57–1.00)
GFR calc Af Amer: 64 mL/min/{1.73_m2} (ref >59)
GFR calc non Af Amer: 56 mL/min/{1.73_m2} — ABNORMAL LOW (ref >59)
GLUCOSE: 103 mg/dL — AB (ref 65–99)
POTASSIUM: 3.8 mmol/L (ref 3.5–5.2)
SODIUM: 124 mmol/L — AB (ref 134–144)

## 2017-03-07 ENCOUNTER — Other Ambulatory Visit: Payer: Self-pay | Admitting: *Deleted

## 2017-03-07 DIAGNOSIS — R7989 Other specified abnormal findings of blood chemistry: Secondary | ICD-10-CM

## 2017-03-07 DIAGNOSIS — Z79899 Other long term (current) drug therapy: Secondary | ICD-10-CM

## 2017-03-10 NOTE — Addendum Note (Signed)
Addended by: Theodore Demark on: 03/10/2017 01:55 PM   Modules accepted: Orders

## 2017-03-12 DIAGNOSIS — R7989 Other specified abnormal findings of blood chemistry: Secondary | ICD-10-CM | POA: Diagnosis not present

## 2017-03-12 DIAGNOSIS — Z79899 Other long term (current) drug therapy: Secondary | ICD-10-CM | POA: Diagnosis not present

## 2017-03-12 LAB — BASIC METABOLIC PANEL
BUN/Creatinine Ratio: 16 (ref 12–28)
BUN: 14 mg/dL (ref 8–27)
CALCIUM: 9.5 mg/dL (ref 8.7–10.3)
CO2: 24 mmol/L (ref 20–29)
Chloride: 90 mmol/L — ABNORMAL LOW (ref 96–106)
Creatinine, Ser: 0.88 mg/dL (ref 0.57–1.00)
GFR calc non Af Amer: 62 mL/min/{1.73_m2} (ref >59)
GFR, EST AFRICAN AMERICAN: 71 mL/min/{1.73_m2} (ref >59)
Glucose: 118 mg/dL — ABNORMAL HIGH (ref 65–99)
POTASSIUM: 4 mmol/L (ref 3.5–5.2)
Sodium: 134 mmol/L (ref 134–144)

## 2017-03-12 NOTE — Progress Notes (Signed)
Sodium corrected. Renal function stable.

## 2017-03-24 ENCOUNTER — Other Ambulatory Visit: Payer: Self-pay | Admitting: Nurse Practitioner

## 2017-04-02 ENCOUNTER — Other Ambulatory Visit: Payer: Self-pay | Admitting: Internal Medicine

## 2017-04-08 ENCOUNTER — Telehealth: Payer: Self-pay | Admitting: *Deleted

## 2017-04-08 NOTE — Telephone Encounter (Signed)
[facility] nurse Maudie Mercury got call from pt stating she wasn't feeling well and wanted to be seen asap. Kim went to pt's residence and called back with BP of 208/100 p71 temp 97.5, 97%. Pt has taking all bp meds today and just doesn't feel well. Per verbal from Dr. Mariea Clonts give pt clonidine 0.2mg  and pt scheduled appt with Sherrie Mustache, NP tomorrow.

## 2017-04-08 NOTE — Telephone Encounter (Signed)
I asked [name] to administer a one time dose of clonidine 0.2mg  once for elevated BP.  Pt had taken her medications including an extra dose of chlorthalidone but had only eaten a little bit of fruit today.  Unclear why she "does not feel well".  Advised to proceed to the ED if she has chest pain, weakness, difficulty with speech or numbness.  She currently refuses urgent care or ED visit today and does agree to be seen by NP at [facility] tomorrow as my [facility] schedule is full.

## 2017-04-09 ENCOUNTER — Ambulatory Visit (INDEPENDENT_AMBULATORY_CARE_PROVIDER_SITE_OTHER): Payer: Medicare Other | Admitting: Nurse Practitioner

## 2017-04-09 ENCOUNTER — Encounter: Payer: Self-pay | Admitting: Nurse Practitioner

## 2017-04-09 VITALS — BP 122/68 | HR 62 | Temp 97.8°F | Resp 17 | Ht 67.0 in | Wt 184.0 lb

## 2017-04-09 DIAGNOSIS — R600 Localized edema: Secondary | ICD-10-CM

## 2017-04-09 DIAGNOSIS — R053 Chronic cough: Secondary | ICD-10-CM

## 2017-04-09 DIAGNOSIS — I872 Venous insufficiency (chronic) (peripheral): Secondary | ICD-10-CM | POA: Diagnosis not present

## 2017-04-09 DIAGNOSIS — R05 Cough: Secondary | ICD-10-CM

## 2017-04-09 DIAGNOSIS — I10 Essential (primary) hypertension: Secondary | ICD-10-CM

## 2017-04-09 MED ORDER — ONDANSETRON HCL 4 MG PO TABS: 4.0000 mg | ORAL_TABLET | Freq: Two times a day (BID) | ORAL | 0 refills | Status: DC | PRN

## 2017-04-09 NOTE — Progress Notes (Signed)
Careteam: Patient Care Team: Gayland Curry, DO as PCP - General (Geriatric Medicine) Martinique, Peter M, MD as Attending Physician (Cardiology) [facility] [place], MD as Consulting Physician (Neurology)  Advanced Directive information    Allergies  Allergen Reactions  . Mirtazapine     tremor  . Nickel Other (See Comments)    inflammation  . Other Itching    Acrylic nails  . Prednisone     Causes sleep disturbances     Chief Complaint  Patient presents with  . Acute Visit    Pt is being seen due to elevated blood pressure. Pt reports bp of 208/100 yesterday. 133/71 at 8 pm last night. 179/87 this am before meds.   . Other    Caregiver from [facility] in room     HPI: Patient is a 83 y.o. female seen in the office today due to elevated blood pressure. Pt had elevated blood pressure (208/100) yesterday after medication (losartan, metoprolol and chlorthalidone). Order for Clonidine 0.2 mg by mouth given and blood pressure improved. She was also feeling bad when blood pressure was elevated but improved once blood pressure improved.  No changes in diet or medications. Complaint with medications.  Feeling much better today.  Pt reports generally is well controlled. Had bad tremors yesterday but this has improved.  Has ongoing cough- tickle in throat, for years. Opoid cough medication helped but addictive so she stopped.  Tessalon pearls has not helped  No congestion, non productive.  Not on ACEI Smoked for 30 years, quit smoking at 65.  Denies wheezing but admits to having shortness of breath on exertion (also states the most she does is "cough and sneeze") Did not improve with PPI so tessalon pearls were added.  She stopped taking omeprazole  Cough effecting quality of life.   Review of Systems:  Review of Systems  Constitutional: Negative for chills, fever and malaise/fatigue.  HENT: Positive for hearing loss. Negative for congestion.     Eyes: Negative for blurred vision.  Respiratory: Positive for cough. Negative for shortness of breath.   Cardiovascular: Positive for leg swelling. Negative for chest pain and palpitations.  Gastrointestinal: Negative for abdominal pain, constipation, diarrhea and heartburn.  Skin: Negative for itching and rash.  Neurological: Negative for dizziness, loss of consciousness and weakness.  Endo/Heme/Allergies: Does not bruise/bleed easily.    Past Medical History:  Diagnosis Date  . A-fib ([place])   . Abdominal pain, unspecified site   . Abnormality of gait 06/02/2013  . Arthritis   . Cataracts, bilateral   . Depression   . Diverticulosis of colon (without mention of hemorrhage)   . Diverticulosis of colon (without mention of hemorrhage) 10/27/2012  . Edema 10/27/2012  . GI bleed 06/2002  . Hemorrhage of gastrointestinal tract, unspecified   . History of stomach ulcers   . Hypertension   . Insomnia, unspecified 10/27/2012  . Localized osteoarthrosis not specified whether primary or secondary, lower leg 05/31/2011  . Migraines   . Nausea alone 10/28/2012  . Obesity, unspecified 10/27/2012  . Osteoarthrosis, unspecified whether generalized or localized, lower leg   . Other acariasis    peripherial neuropathy  . Other and unspecified alcohol dependence, continuous drinking behavior 10/28/2012  . Palpitations   . PVC (premature ventricular contraction)   . Unspecified hereditary and idiopathic peripheral neuropathy   . UTI (lower urinary tract infection)    pt state uti w/ no pain   Past Surgical History:  Procedure  Laterality Date  . ARTHROSCOPIC REPAIR ACL    . BREAST BIOPSY  1973   left  . BREAST REDUCTION SURGERY  12/23/2003  . cataracts    . CHOLECYSTECTOMY  2005  . TONSILLECTOMY AND ADENOIDECTOMY  1941   Social History:   reports that she quit smoking about 33 years ago. Her smoking use included Cigarettes. She has never used smokeless tobacco. She reports that she drinks alcohol. She  reports that she does not use drugs.  No family history on file.  Medications: Patient's Medications  New Prescriptions   No medications on file  Previous Medications   ACETAMINOPHEN (TYLENOL) 325 MG TABLET    Take 650 mg by mouth every 4 (four) hours as needed. For pain   CHLORTHALIDONE (HYGROTON) 25 MG TABLET    Take 1 tablet (25 mg total) by mouth daily as needed.   ELIQUIS 5 MG TABS TABLET    TAKE 1 TABLET BY MOUTH TWICE DAILY   FLECAINIDE (TAMBOCOR) 50 MG TABLET    TAKE 1 TABLET BY MOUTH TWICE DAILY FOR ATRIAL FIBRILLATION   FUROSEMIDE (LASIX) 20 MG TABLET    Take 1 tablet (20 mg total) by mouth as needed.   HYDROCODONE-ACETAMINOPHEN (NORCO/VICODIN) 5-325 MG TABLET    Take 1 tablet by mouth as needed for moderate pain.   LOSARTAN (COZAAR) 100 MG TABLET    TAKE 1 TABLET BY MOUTH DAILY   METOPROLOL SUCCINATE (TOPROL-XL) 100 MG 24 HR TABLET    TAKE 1 TABLET BY MOUTH DAILY, WITH OR IMMEDIATELY FOLLOWING A MEAL   ONDANSETRON (ZOFRAN) 4 MG TABLET    Take 1 tablet (4 mg total) by mouth 2 (two) times daily as needed for nausea.   PHENYLEPHRINE HCL (NEO-SYNEPHRINE NA)    Place 1 spray into the nose 2 (two) times daily as needed (congestion).   SERTRALINE (ZOLOFT) 25 MG TABLET    TAKE 2 TABLETS(50 MG) BY MOUTH DAILY   SODIUM CHLORIDE (OCEAN) 0.65 % SOLN NASAL SPRAY    Place 1 spray into both nostrils daily as needed for congestion.   ZOLPIDEM (AMBIEN) 10 MG TABLET    TAKE 1 TABLET BY MOUTH AT BEDTIME AS NEEDED FOR INSOMNIA  Modified Medications   No medications on file  Discontinued Medications   BENZONATATE (TESSALON) 100 MG CAPSULE    Take 1 capsule (100 mg total) by mouth 2 (two) times daily as needed for cough.   SENNOSIDES-DOCUSATE SODIUM (SENOKOT-S) 8.6-50 MG TABLET    Take 1 tablet by mouth 2 (two) times daily as needed for constipation.     Physical Exam:  Vitals:   04/09/17 1135  BP: 122/68  Pulse: 62  Resp: 17  Temp: 97.8 F (36.6 C)  TempSrc: Oral  SpO2: 97%  Weight:  184 lb (83.5 kg)  Height: 5\' 7"  (1.702 m)   Body mass index is 28.82 kg/m.  Physical Exam  Constitutional: She is oriented to person, place, and time. She appears well-developed and well-nourished. No distress.  HENT:  Mouth/Throat: Oropharynx is clear and moist. No oropharyngeal exudate.  Cardiovascular: Normal rate, regular rhythm and normal heart sounds.   Pulmonary/Chest: Effort normal and breath sounds normal. No respiratory distress. She has no wheezes.  Abdominal: Soft. Bowel sounds are normal. She exhibits no distension.  Musculoskeletal: She exhibits edema (trace).  Neurological: She is alert and oriented to person, place, and time.  Skin: Skin is warm and dry.  Psychiatric: She has a normal mood and affect.    Labs  reviewed: Basic Metabolic Panel:  Recent Labs  05/09/16 0926  02/20/17 0300 03/05/17 1243 03/12/17 1050  NA 138  < > 140 124* 134  K 3.5  < > 3.9 3.8 4.0  CL 105  --   --  82* 90*  CO2 23  --   --  26 24  GLUCOSE 125*  --   --  103* 118*  BUN 11  < > 14 13 14   CREATININE 0.77  < > 0.9 0.96 0.88  CALCIUM 8.8*  --   --  9.0 9.5  < > = values in this interval not displayed. Liver Function Tests:  Recent Labs  04/27/16 1256 05/09/16 0926 01/21/17  AST 15 19 17   ALT 13* 16 14  ALKPHOS 89 69 104  BILITOT 1.0 1.1  --   PROT 7.0 7.0  --   ALBUMIN 4.4 4.3  --    No results for input(s): LIPASE, AMYLASE in the last 8760 hours. No results for input(s): AMMONIA in the last 8760 hours. CBC:  Recent Labs  04/27/16 1256 05/09/16 0926 01/21/17  WBC 6.7 7.9 8.2  NEUTROABS 4.5 6.4  --   HGB 14.3 14.1 15.4  HCT 43.1 41.2 48*  MCV 95.4 93.8  --   PLT 215 217 230   Lipid Panel:  Recent Labs  01/21/17  CHOL 224*  HDL 97*  LDLCALC 90  TRIG 186*   TSH: No results for input(s): TSH in the last 8760 hours. A1C: Lab Results  Component Value Date   HGBA1C 5.3 10/03/2015     Assessment/Plan 1. Essential hypertension -improved today.    -discussed medication compliance and DASH diet.   2. Chronic cough -has been going on for years. Reviewed chest xray from epic from 2017 without acute findings; pt currently not on ACE inhibitor, PPI was tired and not effective therefore she quit taking, tessalon pearls did not help and effecting QOL - Ambulatory referral to Pulmonology for further evaluation   3. Edema of both lower extremities due to peripheral venous insufficiency -does not want to wear compression hose, encouraged to consider compression socks  -elevate legs as much as tolerates.  -limit sodium in diet.    Carlos American. Harle Battiest  [facility] [phone] 8 am - 5 pm) [phone] (after hours)

## 2017-04-09 NOTE — Telephone Encounter (Signed)
Spoke with kim Insurance underwriter) this morning and patient felt better after clonidine BP was 154/90. Pt will keep appt this morning at [facility]

## 2017-04-09 NOTE — Patient Instructions (Signed)
For swelling- compression hose or socks, elevated legs above level or heart as much as possible   Will place pulmonary referral due to chronic cough   DASH Eating Plan DASH stands for "Dietary Approaches to Stop Hypertension." The DASH eating plan is a healthy eating plan that has been shown to reduce high blood pressure (hypertension). It may also reduce your risk for type 2 diabetes, heart disease, and stroke. The DASH eating plan may also help with weight loss. What are tips for following this plan? General guidelines  Avoid eating more than 2,300 mg (milligrams) of salt (sodium) a day. If you have hypertension, you may need to reduce your sodium intake to 1,500 mg a day.  Limit alcohol intake to no more than 1 drink a day for nonpregnant women and 2 drinks a day for men. One drink equals 12 oz of beer, 5 oz of wine, or 1 oz of hard liquor.  Work with your health care provider to maintain a healthy body weight or to lose weight. Ask what an ideal weight is for you.  Get at least 30 minutes of exercise that causes your heart to beat faster (aerobic exercise) most days of the week. Activities may include walking, swimming, or biking.  Work with your health care provider or diet and nutrition specialist (dietitian) to adjust your eating plan to your individual calorie needs. Reading food labels  Check food labels for the amount of sodium per serving. Choose foods with less than 5 percent of the Daily Value of sodium. Generally, foods with less than 300 mg of sodium per serving fit into this eating plan.  To find whole grains, look for the word "whole" as the first word in the ingredient list. Shopping  Buy products labeled as "low-sodium" or "no salt added."  Buy fresh foods. Avoid canned foods and premade or frozen meals. Cooking  Avoid adding salt when cooking. Use salt-free seasonings or herbs instead of table salt or sea salt. Check with your health care provider or pharmacist  before using salt substitutes.  Do not fry foods. Cook foods using healthy methods such as baking, boiling, grilling, and broiling instead.  Cook with heart-healthy oils, such as olive, canola, soybean, or sunflower oil. Meal planning   Eat a balanced diet that includes: ? 5 or more servings of fruits and vegetables each day. At each meal, try to fill half of your plate with fruits and vegetables. ? Up to 6-8 servings of whole grains each day. ? Less than 6 oz of lean meat, poultry, or fish each day. A 3-oz serving of meat is about the same size as a deck of cards. One egg equals 1 oz. ? 2 servings of low-fat dairy each day. ? A serving of nuts, seeds, or beans 5 times each week. ? Heart-healthy fats. Healthy fats called Omega-3 fatty acids are found in foods such as flaxseeds and coldwater fish, like sardines, salmon, and mackerel.  Limit how much you eat of the following: ? Canned or prepackaged foods. ? Food that is high in trans fat, such as fried foods. ? Food that is high in saturated fat, such as fatty meat. ? Sweets, desserts, sugary drinks, and other foods with added sugar. ? Full-fat dairy products.  Do not salt foods before eating.  Try to eat at least 2 vegetarian meals each week.  Eat more home-cooked food and less restaurant, buffet, and fast food.  When eating at a restaurant, ask that your food be  prepared with less salt or no salt, if possible. What foods are recommended? The items listed may not be a complete list. Talk with your dietitian about what dietary choices are best for you. Grains Whole-grain or whole-wheat bread. Whole-grain or whole-wheat pasta. Brown rice. [name]. Bulgur. Whole-grain and low-sodium cereals. Pita bread. Low-fat, low-sodium crackers. Whole-wheat flour tortillas. Vegetables Fresh or frozen vegetables (raw, steamed, roasted, or grilled). Low-sodium or reduced-sodium tomato and vegetable juice. Low-sodium or reduced-sodium tomato  sauce and tomato paste. Low-sodium or reduced-sodium canned vegetables. Fruits All fresh, dried, or frozen fruit. Canned fruit in natural juice (without added sugar). Meat and other protein foods Skinless chicken or [place]. Ground chicken or [place]. Pork with fat trimmed off. Fish and seafood. Egg whites. Dried beans, peas, or lentils. Unsalted nuts, nut butters, and seeds. Unsalted canned beans. Lean cuts of beef with fat trimmed off. Low-sodium, lean deli meat. Dairy Low-fat (1%) or fat-free (skim) milk. Fat-free, low-fat, or reduced-fat cheeses. Nonfat, low-sodium ricotta or cottage cheese. Low-fat or nonfat yogurt. Low-fat, low-sodium cheese. Fats and oils Soft margarine without trans fats. Vegetable oil. Low-fat, reduced-fat, or light mayonnaise and salad dressings (reduced-sodium). Canola, safflower, olive, soybean, and sunflower oils. Avocado. Seasoning and other foods Herbs. Spices. Seasoning mixes without salt. Unsalted popcorn and pretzels. Fat-free sweets. What foods are not recommended? The items listed may not be a complete list. Talk with your dietitian about what dietary choices are best for you. Grains Baked goods made with fat, such as croissants, muffins, or some breads. Dry pasta or rice meal packs. Vegetables Creamed or fried vegetables. Vegetables in a cheese sauce. Regular canned vegetables (not low-sodium or reduced-sodium). Regular canned tomato sauce and paste (not low-sodium or reduced-sodium). Regular tomato and vegetable juice (not low-sodium or reduced-sodium). [name]. Olives. Fruits Canned fruit in a light or heavy syrup. Fried fruit. Fruit in cream or butter sauce. Meat and other protein foods Fatty cuts of meat. Ribs. Fried meat. [name]. Sausage. Bologna and other processed lunch meats. Salami. Fatback. Hotdogs. Bratwurst. Salted nuts and seeds. Canned beans with added salt. Canned or smoked fish. Whole eggs or egg yolks. Chicken or [place] with skin. Dairy Whole  or 2% milk, cream, and half-and-half. Whole or full-fat cream cheese. Whole-fat or sweetened yogurt. Full-fat cheese. Nondairy creamers. Whipped toppings. Processed cheese and cheese spreads. Fats and oils Butter. Stick margarine. Lard. Shortening. Ghee. Bacon fat. Tropical oils, such as coconut, palm kernel, or palm oil. Seasoning and other foods Salted popcorn and pretzels. Onion salt, garlic salt, seasoned salt, table salt, and sea salt. Worcestershire sauce. Tartar sauce. Barbecue sauce. Teriyaki sauce. Soy sauce, including reduced-sodium. Steak sauce. Canned and packaged gravies. Fish sauce. Oyster sauce. Cocktail sauce. Horseradish that you find on the shelf. Ketchup. Mustard. Meat flavorings and tenderizers. Bouillon cubes. Hot sauce and Tabasco sauce. Premade or packaged marinades. Premade or packaged taco seasonings. Relishes. Regular salad dressings. Where to find more information:  National Heart, Lung, and [place]: https://wilson-eaton.com/  American Heart Association: www.heart.org Summary  The DASH eating plan is a healthy eating plan that has been shown to reduce high blood pressure (hypertension). It may also reduce your risk for type 2 diabetes, heart disease, and stroke.  With the DASH eating plan, you should limit salt (sodium) intake to 2,300 mg a day. If you have hypertension, you may need to reduce your sodium intake to 1,500 mg a day.  When on the DASH eating plan, aim to eat more fresh fruits and vegetables, whole grains,  lean proteins, low-fat dairy, and heart-healthy fats.  Work with your health care provider or diet and nutrition specialist (dietitian) to adjust your eating plan to your individual calorie needs. This information is not intended to replace advice given to you by your health care provider. Make sure you discuss any questions you have with your health care provider. Document Released: 08/29/2011 Document Revised: 09/02/2016 Document Reviewed:  09/02/2016 Elsevier Interactive Patient Education  2017 [employer].

## 2017-04-22 ENCOUNTER — Other Ambulatory Visit: Payer: Self-pay | Admitting: *Deleted

## 2017-04-22 DIAGNOSIS — D649 Anemia, unspecified: Secondary | ICD-10-CM | POA: Diagnosis not present

## 2017-04-22 DIAGNOSIS — R197 Diarrhea, unspecified: Secondary | ICD-10-CM | POA: Diagnosis not present

## 2017-04-22 DIAGNOSIS — R110 Nausea: Secondary | ICD-10-CM | POA: Diagnosis not present

## 2017-04-22 DIAGNOSIS — R6889 Other general symptoms and signs: Secondary | ICD-10-CM | POA: Diagnosis not present

## 2017-04-22 LAB — CBC AND DIFFERENTIAL
HCT: 46 (ref 36–46)
Hemoglobin: 15.9 (ref 12.0–16.0)
Platelets: 226 (ref 150–399)
WBC: 7.8

## 2017-04-22 LAB — HEPATIC FUNCTION PANEL
ALT: 13 (ref 7–35)
AST: 20 (ref 13–35)
Alkaline Phosphatase: 86 (ref 25–125)
Bilirubin, Total: 1

## 2017-04-22 LAB — BASIC METABOLIC PANEL
BUN: 15 (ref 4–21)
Creatinine: 0.8 (ref 0.5–1.1)
Glucose: 112
Potassium: 3.6 (ref 3.4–5.3)
Sodium: 133 — AB (ref 137–147)

## 2017-04-24 DIAGNOSIS — R197 Diarrhea, unspecified: Secondary | ICD-10-CM | POA: Diagnosis not present

## 2017-04-24 DIAGNOSIS — R3915 Urgency of urination: Secondary | ICD-10-CM | POA: Diagnosis not present

## 2017-04-24 DIAGNOSIS — R350 Frequency of micturition: Secondary | ICD-10-CM | POA: Diagnosis not present

## 2017-04-24 DIAGNOSIS — R319 Hematuria, unspecified: Secondary | ICD-10-CM | POA: Diagnosis not present

## 2017-04-24 DIAGNOSIS — D649 Anemia, unspecified: Secondary | ICD-10-CM | POA: Diagnosis not present

## 2017-04-24 LAB — BASIC METABOLIC PANEL
BUN: 7 (ref 4–21)
Creatinine: 0.7 (ref 0.5–1.1)
Glucose: 92
Potassium: 3.2 — AB (ref 3.4–5.3)
Sodium: 139 (ref 137–147)

## 2017-04-29 ENCOUNTER — Non-Acute Institutional Stay (SKILLED_NURSING_FACILITY): Payer: Medicare Other | Admitting: Internal Medicine

## 2017-04-29 ENCOUNTER — Encounter: Payer: Self-pay | Admitting: Internal Medicine

## 2017-04-29 DIAGNOSIS — I10 Essential (primary) hypertension: Secondary | ICD-10-CM

## 2017-04-29 DIAGNOSIS — I480 Paroxysmal atrial fibrillation: Secondary | ICD-10-CM

## 2017-04-29 DIAGNOSIS — R110 Nausea: Secondary | ICD-10-CM | POA: Diagnosis not present

## 2017-04-29 DIAGNOSIS — G4700 Insomnia, unspecified: Secondary | ICD-10-CM | POA: Diagnosis not present

## 2017-04-29 DIAGNOSIS — E861 Hypovolemia: Secondary | ICD-10-CM

## 2017-04-29 DIAGNOSIS — K219 Gastro-esophageal reflux disease without esophagitis: Secondary | ICD-10-CM | POA: Diagnosis not present

## 2017-04-29 DIAGNOSIS — E860 Dehydration: Secondary | ICD-10-CM

## 2017-04-29 DIAGNOSIS — R197 Diarrhea, unspecified: Secondary | ICD-10-CM

## 2017-04-29 DIAGNOSIS — R05 Cough: Secondary | ICD-10-CM | POA: Diagnosis not present

## 2017-04-29 DIAGNOSIS — R059 Cough, unspecified: Secondary | ICD-10-CM

## 2017-04-29 NOTE — Progress Notes (Signed)
Patient ID: [name], female   DOB: [birth year], 83 y.o.   MRN: [id number]  Location:  [facility] Room Number: [place] of Service:  SNF (31)  Provider: Ether Wolters L. Mariea Clonts, D.O., C.M.D.  PCP: Gayland Curry, DO Patient Care Team: Gayland Curry, DO as PCP - General (Geriatric Medicine) [place], Peter M, MD as Attending Physician (Cardiology) [facility] Rose[facility] [place], MD as Consulting Physician (Neurology)  Extended Emergency Contact Information Primary Emergency Contact: Darden Palmer [place] of [place] Phone: [phone] Mobile Phone: [phone] Relation: Relative Secondary Emergency Contact: [place] Work Phone: [phone] Mobile Phone: [phone] Relation: None  Code Status: DNR Goals of care:  Advanced Directive information Advanced Directives 04/29/2017  Does Patient Have a Medical Advance Directive? Yes  Type of Advance Directive Out of facility DNR (pink MOST or yellow form);Healthcare Power of Attorney  Does patient want to make changes to medical advance directive? -  Copy of [place] in Chart? Yes  Pre-existing out of facility DNR order (yellow form or pink MOST form) Yellow form placed in chart (order not valid for inpatient use)     Allergies  Allergen Reactions  . Mirtazapine     tremor  . Nickel Other (See Comments)    inflammation  . Other Itching    Acrylic nails  . Prednisone     Causes sleep disturbances     Chief Complaint  Patient presents with  . New Admit To SNF    HPI:  83 y.o. female  with h/o afib, htn, chronic low back pain, OA, prior GI bleed, alcohol use, neuropathy, insomnia, chronic intermittent nausea and depression seen for admission to [facility] rehab on 04/23/17 with generalized malaise, weakness, nausea and a day of diarrhea prior to arrival.  She was found to be hypovolemic due to poor po intake for about 3  days due to nausea and her diarrhea.  She has already received IVFs and has started to eat and drink well again.  She reports that her stomach does not hold as much as it used to and she's having to eat smaller more frequent meals.  She seems to have spells of poor intake, nausea and diarrhea that resolve on their own.  She refuses hospitalization and workup outside of labs.  [place] caregiver is there b/w 10 and 3.    Past Medical History:  Diagnosis Date  . A-fib ([place])   . Abdominal pain, unspecified site   . Abnormality of gait 06/02/2013  . Arthritis   . Cataracts, bilateral   . Depression   . Diverticulosis of colon (without mention of hemorrhage)   . Diverticulosis of colon (without mention of hemorrhage) 10/27/2012  . Edema 10/27/2012  . GI bleed 06/2002  . Hemorrhage of gastrointestinal tract, unspecified   . History of stomach ulcers   . Hypertension   . Insomnia, unspecified 10/27/2012  . Localized osteoarthrosis not specified whether primary or secondary, lower leg 05/31/2011  . Migraines   . Nausea alone 10/28/2012  . Obesity, unspecified 10/27/2012  . Osteoarthrosis, unspecified whether generalized or localized, lower leg   . Other acariasis    peripherial neuropathy  . Other and unspecified alcohol dependence, continuous drinking behavior 10/28/2012  . Palpitations   . PVC (premature ventricular contraction)   . Unspecified hereditary and idiopathic peripheral neuropathy   . UTI (lower urinary  tract infection)    pt state uti w/ no pain    Past Surgical History:  Procedure Laterality Date  . ARTHROSCOPIC REPAIR ACL    . BREAST BIOPSY  1973   left  . BREAST REDUCTION SURGERY  12/23/2003  . cataracts    . CHOLECYSTECTOMY  2005  . TONSILLECTOMY AND ADENOIDECTOMY  1941      reports that she quit smoking about 33 years ago. Her smoking use included Cigarettes. She has never used smokeless tobacco. She reports that she drinks  alcohol. She reports that she does not use drugs. Social History   Social History  . Marital status: Widowed    Spouse name: N/A  . Number of children: N/A  . Years of education: N/A   Occupational History  . retired Marine scientist    Social History Main Topics  . Smoking status: Former Smoker    Types: Cigarettes    Quit date: 10/09/1983  . Smokeless tobacco: Never Used  . Alcohol use 0.0 oz/week    1 - 2 Shots of liquor per week     Comment: daily  scotch   . Drug use: No  . Sexual activity: No   Other Topics Concern  . Not on file   Social History Narrative   Lives at [facility], in [place]   Widow -2004   Stopped smoking 1985   POA, DNR, Living Will   [place] with walker   Alcohol scotch daily    Exercise none   Functional Status Survey:    Allergies  Allergen Reactions  . Mirtazapine     tremor  . Nickel Other (See Comments)    inflammation  . Other Itching    Acrylic nails  . Prednisone     Causes sleep disturbances     Pertinent  Health Maintenance Due  Topic Date Due  . DEXA SCAN  05/08/2000  . INFLUENZA VACCINE  04/23/2017  . PNA vac Low Risk Adult  Completed    Medications: Outpatient Encounter Prescriptions as of 04/29/2017  Medication Sig  . acetaminophen (TYLENOL) 325 MG tablet Take 650 mg by mouth every 4 (four) hours as needed. For pain  . cloNIDine (CATAPRES) 0.1 MG tablet Take 0.1 mg by mouth as needed.  Marland Kitchen ELIQUIS 5 MG TABS tablet TAKE 1 TABLET BY MOUTH TWICE DAILY  . flecainide (TAMBOCOR) 50 MG tablet TAKE 1 TABLET BY MOUTH TWICE DAILY FOR ATRIAL FIBRILLATION  . furosemide (LASIX) 20 MG tablet Take 1 tablet (20 mg total) by mouth as needed.  Marland Kitchen HYDROcodone-acetaminophen (NORCO/VICODIN) 5-325 MG tablet Take 1 tablet by mouth daily as needed for moderate pain.   Marland Kitchen losartan (COZAAR) 100 MG tablet TAKE 1 TABLET BY MOUTH DAILY  . metoprolol succinate (TOPROL-XL) 100 MG 24 hr tablet TAKE 1 TABLET BY MOUTH DAILY, WITH OR IMMEDIATELY FOLLOWING A MEAL  .  ondansetron (ZOFRAN) 4 MG tablet Take 1 tablet (4 mg total) by mouth 2 (two) times daily as needed for nausea.  Marland Kitchen Phenylephrine HCl (NEO-SYNEPHRINE NA) Place 1 spray into the nose 2 (two) times daily as needed (congestion).  . sennosides-docusate sodium (SENOKOT-S) 8.6-50 MG tablet Take 1 tablet by mouth daily.  . sertraline (ZOLOFT) 25 MG tablet TAKE 2 TABLETS(50 MG) BY MOUTH DAILY  . sodium chloride (OCEAN) 0.65 % SOLN nasal spray Place 1 spray into both nostrils daily as needed for congestion.  Marland Kitchen zolpidem (AMBIEN) 5 MG tablet Take 5 mg by mouth at bedtime as needed for sleep.  . [  DISCONTINUED] chlorthalidone (HYGROTON) 25 MG tablet Take 1 tablet (25 mg total) by mouth daily as needed.   No facility-administered encounter medications on file as of 04/29/2017.     Review of Systems  Constitutional: Positive for appetite change. Negative for chills and fever.  HENT: Negative for congestion.   Eyes: Negative for visual disturbance.       Glasses  Respiratory: Negative for chest tightness and shortness of breath.   Cardiovascular: Negative for chest pain, palpitations and leg swelling.  Gastrointestinal: Positive for diarrhea, nausea and vomiting. Negative for constipation.       At admission, now better  Genitourinary: Negative for dysuria.  Musculoskeletal: Positive for gait problem. Negative for joint swelling.  Neurological: Negative for dizziness and weakness.  Hematological: Does not bruise/bleed easily.  Psychiatric/Behavioral: Negative for agitation, behavioral problems, confusion and sleep disturbance. The patient is not nervous/anxious.     Vitals:   04/29/17 1044  BP: (!) 146/86  Pulse: (!) 54  Resp: 20  Temp: (!) 97.2 F (36.2 C)  TempSrc: Oral  SpO2: 93%  Weight: 183 lb (83 kg)   Body mass index is 28.66 kg/m. Physical Exam  Constitutional: She is oriented to person, place, and time. She appears well-developed and well-nourished. No distress.  HENT:  Head:  Normocephalic and atraumatic.  Right Ear: External ear normal.  Left Ear: External ear normal.  Mouth/Throat: Oropharynx is clear and moist.  Eyes: Pupils are equal, round, and reactive to light. Conjunctivae and EOM are normal.  Neck: Normal range of motion. Neck supple. No JVD present. No thyromegaly present.  Cardiovascular: Normal rate, regular rhythm, normal heart sounds and intact distal pulses.   Pulmonary/Chest: Effort normal and breath sounds normal. No respiratory distress.  Abdominal: Soft. Bowel sounds are normal. She exhibits no distension and no mass. There is no tenderness.  Musculoskeletal: Normal range of motion. She exhibits edema.  Seated in recliner chair  Lymphadenopathy:    She has no cervical adenopathy.  Neurological: She is alert and oriented to person, place, and time.  Skin: Skin is warm and dry.  Psychiatric: She has a normal mood and affect.    Labs reviewed: Basic Metabolic Panel:  Recent Labs  05/09/16 0926  03/05/17 1243 03/12/17 1050 04/22/17 0212 04/24/17 0500  NA 138  < > 124* 134 133* 139  K 3.5  < > 3.8 4.0 3.6 3.2*  CL 105  --  82* 90*  --   --   CO2 23  --  26 24  --   --   GLUCOSE 125*  --  103* 118*  --   --   BUN 11  < > 13 14 15 7   CREATININE 0.77  < > 0.96 0.88 0.8 0.7  CALCIUM 8.8*  --  9.0 9.5  --   --   < > = values in this interval not displayed. Liver Function Tests:  Recent Labs  05/09/16 0926 01/21/17 04/22/17 0212  AST 19 17 20   ALT 16 14 13   ALKPHOS 69 104 86  BILITOT 1.1  --   --   PROT 7.0  --   --   ALBUMIN 4.3  --   --    No results for input(s): LIPASE, AMYLASE in the last 8760 hours. No results for input(s): AMMONIA in the last 8760 hours. CBC:  Recent Labs  05/09/16 0926 01/21/17 04/22/17 0212  WBC 7.9 8.2 7.8  NEUTROABS 6.4  --   --   HGB  14.1 15.4 15.9  HCT 41.2 48* 46  MCV 93.8  --   --   PLT 217 230 226   Assessment/Plan:   1. Hypovolemia due to dehydration -resolved with IVFs and repeat  bmp, intake returned to near normal (needs smaller portions)  2. Nausea -resolved, seems to have this intermittently--I question if there is a role of her scotch intake (two each night)  3. Diarrhea of presumed infectious origin -resolved after 24 hrs  4. Essential hypertension -bp was quite high when meds held but has returned to satisfactory range -cont current regimen: losartan 100mg  daily, clonidine prn SBP>180, toprol xl 100mg  daily   5. Gastroesophageal reflux disease, esophagitis presence not specified May not truly contribute unless better off scotch Not on meds   6. Paroxysmal atrial fibrillation (HCC) -cont flecainide and toprol xl plus eliquis for rate control  7.  Cough -resolved while in rehab -? Due to holding losartan for 4 days or allergies (has now had it again for a few days) or an allergy to her cat or something else in her home -she plans to get a filter device at home to see if that helps, we also discussed putting the mattress/boxspring, pillows in liners to protect from dust and pet dander--interestingly, she has her blanket and pillow from home here which must have some of the fur on them and she's not affected  8.  Insomnia: -pt's Lorrin Mais has been reduced to 5mg  qhs prn insomnia and she has rested well here so no need for 10mg  in outpatient world  Patient is being discharged with the following home health services:  none  Patient is being discharged with the following durable medical equipment: no new, has walker, wheelchair   Patient has been advised to keep regular f/u with me as scheduled. 08/06/2017   Future labs/tests needed:  No new  Cartier Washko L. Calieb Lichtman, D.O. [facility] [address], [place] [address] Cell Phone (Mon-Fri 8am-5pm):  [phone] On Call:  [phone] & follow prompts after 5pm & weekends Office Phone:  [phone] Office Fax:  [phone]

## 2017-05-01 ENCOUNTER — Other Ambulatory Visit: Payer: Self-pay | Admitting: Internal Medicine

## 2017-05-02 ENCOUNTER — Other Ambulatory Visit: Payer: Self-pay | Admitting: Internal Medicine

## 2017-05-02 ENCOUNTER — Other Ambulatory Visit: Payer: Self-pay

## 2017-05-02 MED ORDER — ZOLPIDEM TARTRATE 5 MG PO TABS: 5.0000 mg | ORAL_TABLET | Freq: Every evening | ORAL | 0 refills | Status: DC | PRN

## 2017-05-17 NOTE — Progress Notes (Signed)
[name] Date of Birth: Apr 14, 1933 Medical Record #[id number]  History of Present Illness: [name] is seen for follow up of atrial fibrillation. She initially was diagnosed with Afib in 2013 and converted spontaneously. She had recurrence in Jan 2014 and converted on Flecainide. She has been managed with flecainide and metoprolol. She is also anticoagulated with Eliquis. She was seen by Martha Deforest PA-C in May with complaints of edema, dyspnea, shakiness, and nausea. Given lasix to take on a prn basis. Echo updated and showed mild to moderate AI with normal LV function. Seen in June and noted intermittent spikes in BP. Was placed on chlorthalidone but this made her feel weak. It was switched to prn.    She is currently living at [facility]. She was admitted to the Rehab unit on August 1 with dehydration with nausea and diarrhea. Received IVF. Otherwise is doing OK. Just generally doesn't feel well and states her quality of life is poor. Has a chronic cough and is scheduled for evaluation with pulmonary. No edema. No PND or orthopnea. No chest pain.  Current Outpatient Prescriptions on File Prior to Visit  Medication Sig Dispense Refill  . acetaminophen (TYLENOL) 325 MG tablet Take 650 mg by mouth every 4 (four) hours as needed. For pain    . ELIQUIS 5 MG TABS tablet TAKE 1 TABLET BY MOUTH TWICE DAILY 180 tablet 1  . flecainide (TAMBOCOR) 50 MG tablet TAKE 1 TABLET BY MOUTH TWICE DAILY FOR ATRIAL FIBRILLATION 180 tablet 1  . HYDROcodone-acetaminophen (NORCO/VICODIN) 5-325 MG tablet Take 1 tablet by mouth daily as needed for moderate pain.     Marland Kitchen losartan (COZAAR) 100 MG tablet TAKE 1 TABLET BY MOUTH DAILY 90 tablet 1  . metoprolol succinate (TOPROL-XL) 100 MG 24 hr tablet TAKE 1 TABLET BY MOUTH DAILY, WITH OR IMMEDIATELY FOLLOWING A MEAL 90 tablet 1  . ondansetron (ZOFRAN) 4 MG tablet Take 1 tablet (4 mg total) by mouth 2 (two) times daily as needed for nausea. 20 tablet 0  . Phenylephrine  HCl (NEO-SYNEPHRINE NA) Place 1 spray into the nose 2 (two) times daily as needed (congestion).    . sennosides-docusate sodium (SENOKOT-S) 8.6-50 MG tablet Take 1 tablet by mouth daily.    . sertraline (ZOLOFT) 25 MG tablet TAKE 2 TABLETS(50 MG) BY MOUTH DAILY 180 tablet 3  . sodium chloride (OCEAN) 0.65 % SOLN nasal spray Place 1 spray into both nostrils daily as needed for congestion.    Marland Kitchen zolpidem (AMBIEN) 5 MG tablet Take 1 tablet (5 mg total) by mouth at bedtime as needed for sleep. 30 tablet 0  . furosemide (LASIX) 20 MG tablet Take 1 tablet (20 mg total) by mouth as needed. 30 tablet 6   No current facility-administered medications on file prior to visit.     Allergies  Allergen Reactions  . Mirtazapine     tremor  . Nickel Other (See Comments)    inflammation  . Other Itching    Acrylic nails  . Prednisone     Causes sleep disturbances     Past Medical History:  Diagnosis Date  . A-fib ([place])   . Abdominal pain, unspecified site   . Abnormality of gait 06/02/2013  . Arthritis   . Cataracts, bilateral   . Depression   . Diverticulosis of colon (without mention of hemorrhage)   . Diverticulosis of colon (without mention of hemorrhage) 10/27/2012  . Edema 10/27/2012  . GI bleed 06/2002  . Hemorrhage of  gastrointestinal tract, unspecified   . History of stomach ulcers   . Hypertension   . Insomnia, unspecified 10/27/2012  . Localized osteoarthrosis not specified whether primary or secondary, lower leg 05/31/2011  . Migraines   . Nausea alone 10/28/2012  . Obesity, unspecified 10/27/2012  . Osteoarthrosis, unspecified whether generalized or localized, lower leg   . Other acariasis    peripherial neuropathy  . Other and unspecified alcohol dependence, continuous drinking behavior 10/28/2012  . Palpitations   . PVC (premature ventricular contraction)   . Unspecified hereditary and idiopathic peripheral neuropathy   . UTI (lower urinary tract infection)    pt state uti w/ no pain     Past Surgical History:  Procedure Laterality Date  . ARTHROSCOPIC REPAIR ACL    . BREAST BIOPSY  1973   left  . BREAST REDUCTION SURGERY  12/23/2003  . cataracts    . CHOLECYSTECTOMY  2005  . TONSILLECTOMY AND ADENOIDECTOMY  1941    History  Smoking Status  . Former Smoker  . Types: Cigarettes  . Quit date: 10/09/1983  Smokeless Tobacco  . Never Used    History  Alcohol Use  . 0.0 oz/week  . 1 - 2 Shots of liquor per week    Comment: daily  scotch     History reviewed. No pertinent family history.  Review of Systems: As noted in history of present illness All other systems were reviewed and are negative.  Physical Exam: BP 118/62   Pulse 64   Ht 5' 7.5" (1.715 m)   Wt 180 lb 12.8 oz (82 kg)   LMP  (LMP Unknown)   BMI 27.90 kg/m  She is a pleasant, elderly white female in no acute distress. She is in a wheelchair.  The patient is alert and oriented x 3.    The skin is warm and dry.    The HEENT exam is normal. There is no JVD.  The lungs are clear.    The heart exam reveals a regular rate with a normal S1 and S2.  There is a grade 4-7/8 diastolic murmur the right upper sternal border.  The PMI is not displaced.   Abdominal exam reveals good bowel sounds.  There is no guarding or rebound.  There is no hepatosplenomegaly or tenderness.  There are no masses.  Exam of the legs reveal no clubbing, cyanosis, or edema.  The legs are without rashes.  The distal pulses are intact.  Cranial nerves II - XII are intact.   LABORATORY DATA: Lab Results  Component Value Date   WBC 7.8 04/22/2017   HGB 15.9 04/22/2017   HCT 46 04/22/2017   PLT 226 04/22/2017   GLUCOSE 118 (H) 03/12/2017   CHOL 224 (A) 01/21/2017   TRIG 186 (A) 01/21/2017   HDL 97 (A) 01/21/2017   LDLCALC 90 01/21/2017   ALT 13 04/22/2017   AST 20 04/22/2017   NA 139 04/24/2017   K 3.2 (A) 04/24/2017   CL 90 (L) 03/12/2017   CREATININE 0.7 04/24/2017   BUN 7 04/24/2017   CO2 24 03/12/2017   TSH 1.215  06/05/2013   INR 1.30 06/05/2013   HGBA1C 5.3 10/03/2015     Echo: 02/28/17: Study Conclusions  - Left ventricle: The cavity size was normal. Wall thickness was   increased in a pattern of moderate LVH. There was focal basal   hypertrophy. Systolic function was normal. The estimated ejection   fraction was in the range of 55% to  60%. Wall motion was normal;   there were no regional wall motion abnormalities. Doppler   parameters are consistent with abnormal left ventricular   relaxation (grade 1 diastolic dysfunction). - Aortic valve: Mildly calcified annulus. There was mild to   moderate regurgitation. - Mitral valve: Valve area by pressure half-time: 1.88 cm^2.  Assessment / Plan: 1. Atrial fibrillation- maintaining NSR on flecainide. Recommend continue 50 mg bid for maintenance. No documented recurrence since 2014. Explained rationale for continued Eliquis use to reduce risk of CVA. She is on appropriate dose. 83 yo but normal renal function and body weight > 60 kg.  2. Hypertension, controlled. Continue losartan and Toprol. Currently only using chlorthalidone as needed for BP spikes.   3. PVCs-  Minimal symptoms.   4.Mild-moderate AI by prior Echo.asymptomatic.   5. Edema- resolved.   Follow up in 6 months.

## 2017-05-19 ENCOUNTER — Encounter: Payer: Self-pay | Admitting: Cardiology

## 2017-05-19 ENCOUNTER — Ambulatory Visit (INDEPENDENT_AMBULATORY_CARE_PROVIDER_SITE_OTHER): Payer: Medicare Other | Admitting: Cardiology

## 2017-05-19 VITALS — BP 118/62 | HR 64 | Ht 67.5 in | Wt 180.8 lb

## 2017-05-19 DIAGNOSIS — I480 Paroxysmal atrial fibrillation: Secondary | ICD-10-CM | POA: Diagnosis not present

## 2017-05-19 DIAGNOSIS — I5032 Chronic diastolic (congestive) heart failure: Secondary | ICD-10-CM

## 2017-05-19 DIAGNOSIS — I10 Essential (primary) hypertension: Secondary | ICD-10-CM

## 2017-05-19 NOTE — Patient Instructions (Signed)
Continue your current therapy  I will see you in 6 months.   

## 2017-05-23 ENCOUNTER — Other Ambulatory Visit (INDEPENDENT_AMBULATORY_CARE_PROVIDER_SITE_OTHER): Payer: Medicare Other

## 2017-05-23 ENCOUNTER — Encounter: Payer: Self-pay | Admitting: Internal Medicine

## 2017-05-23 ENCOUNTER — Ambulatory Visit (INDEPENDENT_AMBULATORY_CARE_PROVIDER_SITE_OTHER): Payer: Medicare Other | Admitting: Internal Medicine

## 2017-05-23 ENCOUNTER — Ambulatory Visit (INDEPENDENT_AMBULATORY_CARE_PROVIDER_SITE_OTHER)
Admission: RE | Admit: 2017-05-23 | Discharge: 2017-05-23 | Disposition: A | Discharge status: Home / Self Care | Payer: Medicare Other | Source: Ambulatory Visit | Attending: Internal Medicine | Admitting: Internal Medicine

## 2017-05-23 VITALS — BP 122/62 | HR 65 | Ht 67.5 in | Wt 181.0 lb

## 2017-05-23 DIAGNOSIS — R059 Cough, unspecified: Secondary | ICD-10-CM

## 2017-05-23 DIAGNOSIS — I10 Essential (primary) hypertension: Secondary | ICD-10-CM

## 2017-05-23 DIAGNOSIS — Z23 Encounter for immunization: Secondary | ICD-10-CM

## 2017-05-23 DIAGNOSIS — R05 Cough: Secondary | ICD-10-CM

## 2017-05-23 LAB — CBC WITH DIFFERENTIAL/PLATELET
BASOS ABS: 0.1 10*3/uL (ref 0.0–0.1)
Basophils Relative: 0.8 % (ref 0.0–3.0)
EOS ABS: 0.1 10*3/uL (ref 0.0–0.7)
Eosinophils Relative: 0.8 % (ref 0.0–5.0)
HEMATOCRIT: 45 % (ref 36.0–46.0)
Hemoglobin: 15.1 g/dL — ABNORMAL HIGH (ref 12.0–15.0)
LYMPHS PCT: 21.6 % (ref 12.0–46.0)
Lymphs Abs: 1.4 10*3/uL (ref 0.7–4.0)
MCHC: 33.5 g/dL (ref 30.0–36.0)
MCV: 101 fl — ABNORMAL HIGH (ref 78.0–100.0)
Monocytes Absolute: 0.7 10*3/uL (ref 0.1–1.0)
Monocytes Relative: 10.4 % (ref 3.0–12.0)
Neutro Abs: 4.4 10*3/uL (ref 1.4–7.7)
Neutrophils Relative %: 66.4 % (ref 43.0–77.0)
Platelets: 234 10*3/uL (ref 150.0–400.0)
RBC: 4.45 Mil/uL (ref 3.87–5.11)
RDW: 16 % — ABNORMAL HIGH (ref 11.5–15.5)
WBC: 6.6 10*3/uL (ref 4.0–10.5)

## 2017-05-23 MED ORDER — AZELASTINE-FLUTICASONE 137-50 MCG/ACT NA SUSP: 1.0000 | Freq: Two times a day (BID) | NASAL | 11 refills | Status: DC

## 2017-05-23 MED ORDER — IRBESARTAN 75 MG PO TABS: 75.0000 mg | ORAL_TABLET | Freq: Every day | ORAL | 11 refills | Status: DC

## 2017-05-23 NOTE — Assessment & Plan Note (Addendum)
For reasons that may related to vascular permability and nitric oxide pathways but not elevated  bradykinin levels (as seen with  ACEi use) losartan in the generic form has been reported now from mulitple sources  to cause a similar pattern of non-specific  upper airway symptoms as seen with acei.   This has not been reported with exposure to the other ARB's to date, so it seems reasonable for now to try   generic  avapro if ARB needed or use an alternative class altogether.  See:  Lelon Frohlich Allergy Asthma Immunol  2008: 101: p [phone]    Try avapro 75 mg daily x one month   If not better may need trial off high dose toprol: Strongly prefer in this setting: Bystolic, the most beta -1  selective Beta blocker available in sample form, with bisoprolol the most selective generic choice  on the market.    Total time devoted to counseling  > 50 % of initial 60 min office visit:  review case with pt/ discussion of options/alternatives/ personally creating written customized instructions  in presence of pt  then going over those specific  Instructions directly with the pt including how to use all of the meds but in particular covering each new medication in detail and the difference between the maintenance= "automatic" meds and the prns using an action plan format for the latter (If this problem/symptom => do that organization reading Left to right).  Please see AVS from this visit for a full list of these instructions which I personally wrote for this pt and  are unique to this visit.

## 2017-05-23 NOTE — Progress Notes (Signed)
Subjective:     Patient ID: [name], female   DOB: 08-26-1933,     MRN: [id number]  HPI  12 yowf quit smoking in 1985 fine at the time retired Scientist, research (physical sciences) now a widow living independently at [facility] with cough x 2013 referred to pulmonary clinic 05/23/2017 by Sherrie Mustache for Dr Jose Persia.   05/23/2017 1st [facility] office visit/ Kimori Tartaglia   Chief Complaint  Patient presents with  . Pulmonary Consult    Referred by Dr. Sherrie Mustache. Pt c/o cough x 5 years, worse x 1 1/2 yrs. Her cough comes and goes throughout the day and sometimes wakes her up in the night.   living at [facility] since 2001 new appt since 2004 and cough onset 2013 daily / dry hack only improved when moved to SNF temporarily ? If held cozar during that admit?  Doe x 150 ft flat slow pace with walker x 2 years no recent change  No noct sob but cough most every noct after asleep  Cough occurs in fits then starts sneezing with lot of nasal discharge lots of saline occ neo much better in rehab  No allergy of sinus eval to date     Kouffman Reflux v Neurogenic Cough Differentiator Reflux Comments  Do you awaken from a sound sleep coughing violently?                            With trouble breathing? Yes avg one per night    Do you have choking episodes when you cannot  Get enough air, gasping for air ?              No    Do you usually cough when you lie down into  The bed, or when you just lie down to rest ?                          No    Do you usually cough after meals or eating?         no   Do you cough when (or after) you bend over?    no   GERD SCORE     Kouffman Reflux v Neurogenic Cough Differentiator Neurogenic   Do you more-or-less cough all day long? sporadic   Does change of temperature make you cough? No    Does laughing or chuckling cause you to cough? no   Do fumes (perfume, automobile fumes, burned  Toast, etc.,) cause you to cough ?      no   Does speaking, singing, or talking  on the phone cause you to cough   ?               Sometimes    Neurogenic/Airway score      Tried ppi before supper x maybe 30 days  No better  No obvious day to day or daytime variability or assoc excess/ purulent sputum or mucus plugs or hemoptysis or cp or chest tightness, subjective wheeze or overt sinus or hb symptoms. No unusual exp hx or h/o childhood pna/ asthma or knowledge of premature birth.  Sleeping ok without nocturnal  or early am exacerbation  of respiratory  c/o's or need for noct saba. Also denies any obvious fluctuation of symptoms with weather or environmental changes or other aggravating or alleviating factors except as outlined above   Current Medications, Allergies, Complete Past Medical  History, Past Surgical History, Family History, and Social History were reviewed in [employer] record.  ROS  The following are not active complaints unless bolded sore throat, dysphagia, dental problems, itching, sneezing,  nasal congestion or excess mucus/ purulent secretions, ear ache,   fever, chills, sweats, unintended wt loss, classically pleuritic or exertional cp,  orthopnea pnd or leg swelling, presyncope, palpitations, abdominal pain, anorexia, nausea, vomiting, diarrhea  or change in bowel or bladder habits, change in stools or urine, dysuria,hematuria,  rash, arthralgias, visual complaints, headache, numbness, weakness or ataxia or problems with walking or coordination,  change in mood/affect or memory.          Review of Systems     Objective:   Physical Exam    w/c bound elderly wf nad  Wt Readings from Last 3 Encounters:  05/23/17 181 lb (82.1 kg)  05/19/17 180 lb 12.8 oz (82 kg)  04/29/17 183 lb (83 kg)    Vital signs reviewed  - Note on arrival 02 sats  100% on  RA      HEENT: nl dentition, turbinates bilaterally, and oropharynx. Nl external ear canals without cough reflex   NECK :  without JVD/Nodes/TM/ nl carotid upstrokes  bilaterally   LUNGS: no acc muscle use,  Nl contour chest which is clear to A and P bilaterally without cough on insp or exp maneuvers   CV:  RRR  no s3 or murmur or increase in P2, and no edema   ABD:  soft and nontender with nl inspiratory excursion in the supine position. No bruits or organomegaly appreciated, bowel sounds nl  MS:  Ext warm without deformities, calf tenderness, cyanosis or clubbing No obvious joint restrictions   SKIN: warm and dry without lesions    NEURO:  alert, approp, nl sensorium with  no motor or cerebellar deficits apparent.    CXR PA and Lateral:   05/23/2017 :    I personally reviewed images and agree with radiology impression as follows:       Labs ordered 05/23/2017  Allergy profile        Assessment:

## 2017-05-23 NOTE — Patient Instructions (Addendum)
Dymista one twice daily each nostril x one month   Pepcid 20 mg at bedtime over the counter   For drainage / throat tickle try take CHLORPHENIRAMINE  4 mg - take one every 4 hours as needed - available over the counter- may cause drowsiness so start with just a bedtime dose or two and see how you tolerate it before trying in daytime    Stop losartan and replace with ibesartan 75 mg daily   GERD (REFLUX)  is an extremely common cause of respiratory symptoms just like yours , many times with no obvious heartburn at all.    It can be treated with medication, but also with lifestyle changes including elevation of the head of your bed (ideally with 6 inch  bed blocks),  Smoking cessation, avoidance of late meals, excessive alcohol, and avoid fatty foods, chocolate, peppermint, colas, red wine, and acidic juices such as orange juice.  NO MINT OR MENTHOL PRODUCTS SO NO COUGH DROPS   USE SUGARLESS CANDY INSTEAD (Jolley ranchers or Stover's or Life Savers) or even ice chips will also do - the key is to swallow to prevent all throat clearing. NO OIL BASED VITAMINS - use powdered substitutes.    Please remember to go to the lab and x-ray department downstairs in the basement  for your tests - we will call you with the results when they are available.   Please schedule a follow up office visit in 4 weeks, sooner if needed

## 2017-05-23 NOTE — Assessment & Plan Note (Signed)
Onset 2013  - Trial off losartan 05/23/2017 and on 1st gen h1/ dymista  - Allergy profile 05/23/2017 >  Eos 0. /  IgE    Onset late in life with noct flares on high dose toprol could represent cough variant asthma but she's convinced she was all better when taken off losartan at SNF and now back to her regular independent appt and coughing again.  She may be right about the losartan: see hbp  However. More than likely this is Upper airway cough syndrome (previously labeled PNDS) , is  so named because it's frequently impossible to sort out how much is  CR/sinusitis with freq throat clearing (which can be related to primary GERD)   vs  causing  secondary (" extra esophageal")  GERD from wide swings in gastric pressure that occur with throat clearing, often  promoting self use of mint and menthol lozenges that reduce the lower esophageal sphincter tone and exacerbate the problem further in a cyclical fashion.   These are the same pts (now being labeled as having "irritable larynx syndrome" by some cough centers) who not infrequently have a history of having failed to tolerate ace inhibitors, dry powder inhalers or biphosphonates or report having atypical/extraesophageal reflux symptoms that don't respond to standard doses of PPI  and are easily confused as having aecopd or asthma flares by even experienced allergists/ pulmonologists (myself included).   Will try dymista/ 1st gen h1 and h2  and f/u in 1 month    Reviewed with pt  The standardized cough guidelines published in Chest by Lissa Morales in 2006 are still the best available and consist of a multiple step process (up to 12!) , not a single office visit,  and are intended  to address this problem logically,  with an alogrithm dependent on response to empiric treatment at  each progressive step  to determine a specific diagnosis with  minimal addtional testing needed. Therefore if adherence is an issue or can't be accurately verified,  it's very  unlikely the standard evaluation and treatment will be successful here.    Furthermore, response to therapy (other than acute cough suppression, which should only be used short term with avoidance of narcotic containing cough syrups if possible), can be a gradual process for which the patient is not likely to  perceive immediate benefit.  Unlike going to an eye doctor where the best perscription is almost always the first one and is immediately effective, this is almost never the case in the management of chronic cough syndromes. Therefore the patient needs to commit up front to consistently adhere to recommendations  for up to 6 weeks of therapy directed at the likely underlying problem(s) before the response can be reasonably evaluated.

## 2017-05-27 LAB — RESPIRATORY ALLERGY PROFILE REGION II ~~LOC~~
Allergen, A. alternata, m6: <0.1 kU/L
Allergen, Cedar tree, t12: <0.1 kU/L
Allergen, Mouse Urine Protein, e78: <0.1 kU/L
Allergen, Oak,t7: <0.1 kU/L
Box Elder IgE: <0.1 kU/L
Cat Dander: <0.1 kU/L
IGE (IMMUNOGLOBULIN E), SERUM: 19 kU/L (ref <115)
Pecan/Hickory Tree IgE: <0.1 kU/L
Rough Pigweed  IgE: <0.1 kU/L
Sheep Sorrel IgE: <0.1 kU/L

## 2017-05-27 NOTE — Progress Notes (Signed)
Spoke with pt and notified of results per Dr. Wert. Pt verbalized understanding and denied any questions. 

## 2017-06-01 ENCOUNTER — Other Ambulatory Visit: Payer: Self-pay | Admitting: Internal Medicine

## 2017-06-02 NOTE — Telephone Encounter (Signed)
rx called into pharmacy

## 2017-06-27 ENCOUNTER — Ambulatory Visit (INDEPENDENT_AMBULATORY_CARE_PROVIDER_SITE_OTHER): Payer: Medicare Other | Admitting: Internal Medicine

## 2017-06-27 ENCOUNTER — Encounter: Payer: Self-pay | Admitting: Internal Medicine

## 2017-06-27 VITALS — BP 130/72 | HR 82 | Ht 67.5 in | Wt 185.0 lb

## 2017-06-27 DIAGNOSIS — I10 Essential (primary) hypertension: Secondary | ICD-10-CM

## 2017-06-27 DIAGNOSIS — R059 Cough, unspecified: Secondary | ICD-10-CM

## 2017-06-27 DIAGNOSIS — R05 Cough: Secondary | ICD-10-CM | POA: Diagnosis not present

## 2017-06-27 MED ORDER — METOPROLOL SUCCINATE ER 100 MG PO TB24: ORAL_TABLET | ORAL | Status: DC

## 2017-06-27 MED ORDER — PANTOPRAZOLE SODIUM 40 MG PO TBEC: 40.0000 mg | DELAYED_RELEASE_TABLET | Freq: Every day | ORAL | 2 refills | Status: DC

## 2017-06-27 NOTE — Progress Notes (Signed)
Subjective:     Patient ID: [name], female   DOB: 01/17/1933,     MRN: [id number]    Brief patient profile:  41 yowf quit smoking in 1985 fine at the time retired Scientist, research (physical sciences) now a widow living independently at [facility] with cough x 2013 referred to pulmonary clinic 05/23/2017 by Sherrie Mustache for Dr Jose Persia.   History of Present Illness  05/23/2017 1st [facility] office visit/ Jaynia Fendley   Chief Complaint  Patient presents with  . Pulmonary Consult    Referred by Dr. Sherrie Mustache. Pt c/o cough x 5 years, worse x 1 1/2 yrs. Her cough comes and goes throughout the day and sometimes wakes her up in the night.   living at [facility] since 2001 new appt since 2004 and cough onset 2013 daily / dry hack only improved when moved to SNF temporarily ? If held cozar during that admit?  Doe x 150 ft flat slow pace with walker x 2 years no recent change  No noct sob but cough most every noct after asleep  Cough occurs in fits then starts sneezing with lot of nasal discharge lots of saline occ neo much better in rehab  No allergy or sinus eval to date    Kouffman Reflux v Neurogenic Cough Differentiator Reflux Comments  Do you awaken from a sound sleep coughing violently?                            With trouble breathing? Yes avg one per night    Do you have choking episodes when you cannot  Get enough air, gasping for air ?              No    Do you usually cough when you lie down into  The bed, or when you just lie down to rest ?                          No    Do you usually cough after meals or eating?         no   Do you cough when (or after) you bend over?    no   GERD SCORE     Kouffman Reflux v Neurogenic Cough Differentiator Neurogenic   Do you more-or-less cough all day long? sporadic   Does change of temperature make you cough? No    Does laughing or chuckling cause you to cough? no   Do fumes (perfume, automobile fumes, burned  Toast, etc.,) cause you to cough  ?      no   Does speaking, singing, or talking on the phone cause you to cough   ?               Sometimes    Neurogenic/Airway score      Tried ppi before supper x maybe 30 days  No better rec Dymista one twice daily each nostril x one month  Pepcid 20 mg at bedtime over the counter  For drainage / throat tickle try take CHLORPHENIRAMINE  4 mg - take one every 4 hours as needed - available over the counter- may cause drowsiness so start with just a bedtime dose or two and see how you tolerate it before trying in daytime   Stop losartan and replace with ibesartan 75 mg daily  GERD diet    06/27/2017  f/u ov/Birdell Frasier re:  cough x 2013  Chief Complaint  Patient presents with  . Follow-up    Pt states her cough has improved some. She is having alot of clear nasal d/c.   after cough/ sneeze tends to have watery drainage  Sleeps fine now s noct cough p 1st gen H1 blockers at hs but only takes one all day, usually in am Cough Tends to peak in early afternoon  Denies  limited by breathing from desired activities  / very sedentary - lives in independent living but food is delivered, she does not walk to cafeteria    No obvious day to day or daytime variability or assoc excess/ purulent sputum or mucus plugs or hemoptysis or cp or chest tightness, subjective wheeze or overt sinus or hb symptoms. No unusual exp hx or h/o childhood pna/ asthma or knowledge of premature birth.  Sleeping ok flat without nocturnal  or early am exacerbation  of respiratory  c/o's or need for noct saba. Also denies any obvious fluctuation of symptoms with weather or environmental changes or other aggravating or alleviating factors except as outlined above   Current Allergies, Complete Past Medical History, Past Surgical History, Family History, and Social History were reviewed in [employer] record.  ROS  The following are not active complaints unless bolded Hoarseness, sore throat, dysphagia, dental  problems, itching, sneezing,  nasal congestion and discharge of excess mucus or purulent secretions, ear ache,   fever, chills, sweats, unintended wt loss or wt gain, classically pleuritic or exertional cp,  orthopnea pnd or leg swelling, presyncope, palpitations, abdominal pain, anorexia, nausea, vomiting, diarrhea  or change in bowel habits or change in bladder habits, change in stools or change in urine, dysuria, hematuria,  rash, arthralgias, visual complaints, headache, numbness, weakness or ataxia or problems with walking or coordination,  change in mood/affect or memory.        Current Meds  Medication Sig  . acetaminophen (TYLENOL) 325 MG tablet Take 650 mg by mouth every 4 (four) hours as needed. For pain  . Azelastine-Fluticasone (DYMISTA) 137-50 MCG/ACT SUSP Place 1 spray into the nose 2 (two) times daily.  . chlorthalidone (HYGROTON) 25 MG tablet Take 25 mg by mouth daily as needed. (SBP >180 mmHg)   . ELIQUIS 5 MG TABS tablet TAKE 1 TABLET BY MOUTH TWICE DAILY  . flecainide (TAMBOCOR) 50 MG tablet TAKE 1 TABLET BY MOUTH TWICE DAILY FOR ATRIAL FIBRILLATION  . furosemide (LASIX) 20 MG tablet Take 1 tablet (20 mg total) by mouth as needed.  Marland Kitchen HYDROcodone-acetaminophen (NORCO/VICODIN) 5-325 MG tablet Take 1 tablet by mouth daily as needed for moderate pain.   Marland Kitchen irbesartan (AVAPRO) 75 MG tablet Take 1 tablet (75 mg total) by mouth daily.  .      . ondansetron (ZOFRAN) 4 MG tablet Take 1 tablet (4 mg total) by mouth 2 (two) times daily as needed for nausea.  Marland Kitchen Phenylephrine HCl (NEO-SYNEPHRINE NA) Place 1 spray into the nose 2 (two) times daily as needed (congestion).  . sennosides-docusate sodium (SENOKOT-S) 8.6-50 MG tablet Take 1 tablet by mouth daily.  . sertraline (ZOLOFT) 25 MG tablet TAKE 2 TABLETS(50 MG) BY MOUTH DAILY  . sodium chloride (OCEAN) 0.65 % SOLN nasal spray Place 1 spray into both nostrils daily as needed for congestion.  Marland Kitchen zolpidem (AMBIEN) 5 MG tablet TAKE 1 TABLET BY  MOUTH AT BEDTIME AS NEEDED FOR SLEEP  . [  metoprolol succinate (TOPROL-XL) 100 MG 24 hr tablet TAKE 1  TABLET BY MOUTH DAILY, WITH OR IMMEDIATELY FOLLOWING A MEAL                    Objective:   Physical Exam    w/c bound elderly wf nad occ thraot clearing    06/27/2017        185  05/23/17 181 lb (82.1 kg)  05/19/17 180 lb 12.8 oz (82 kg)  04/29/17 183 lb (83 kg)    Vital signs reviewed  - Note on arrival 02 sats   95% RA    HEENT: nl dentition, turbinates bilaterally, and oropharynx. Nl external ear canals without cough reflex   NECK :  without JVD/Nodes/TM/ nl carotid upstrokes bilaterally   LUNGS: no acc muscle use,  Nl contour chest which is clear to A and P bilaterally without cough on insp or exp maneuvers   CV:  RRR  no s3 or murmur or increase in P2, and  Tace to 1+ pitting lower ext  edema (p missing am lasix)  ABD:  soft and nontender with nl inspiratory excursion in the supine position. No bruits or organomegaly appreciated, bowel sounds nl  MS:  Ext warm without deformities, calf tenderness, cyanosis or clubbing No obvious joint restrictions   SKIN: warm and dry without lesions    NEURO:  alert, approp, nl sensorium with  no motor or cerebellar deficits apparent.    CXR PA and Lateral:   05/23/2017 :    I personally reviewed images and agree with radiology impression as follows:    No active cardiopulmonary disease.           Assessment:

## 2017-06-27 NOTE — Assessment & Plan Note (Signed)
Onset 2013  - Trial off losartan 05/23/2017 and on 1st gen h1/ dymista  - Allergy profile 05/23/2017 >  Eos 0.1/  IgE  19  RAST neg - added ppi qam 06/27/2017 and increase daytime 1st gen H1 blockers per guidelines   Improvement in noct cough but still daytime cough peaking in early afternoon with ddx  = UACS vs cough variant asthma so rec more aggressive rx with 1st gen H1 blockers per guidelines  And add ppi q am for uacs and change toprol dosing for possible asthma (see separate a/p)   I had an extended discussion with the patient reviewing all relevant studies completed to date and  lasting 15 to 20 minutes of a 25 minute visit    Each maintenance medication was reviewed in detail including most importantly the difference between maintenance and prns and under what circumstances the prns are to be triggered using an action plan format that is not reflected in the computer generated alphabetically organized AVS.    Please see AVS for specific instructions unique to this visit that I personally wrote and verbalized to the the pt in detail and then reviewed with pt  by my nurse highlighting any  changes in therapy recommended at today's visit to their plan of care.

## 2017-06-27 NOTE — Assessment & Plan Note (Signed)
Change losartan to avapro 05/23/2017 due to cough  - split toprol into divided doses 06/27/2017  = 100 mg one half bid   Normally  prefer in this setting: Bystolic, the most beta -1  selective Beta blocker available in sample form, with bisoprolol the most selective generic choice  on the market - but since I'm not inclined to think this is really asthma related cough it's ok to use the toprol at the dose that smoothes out the dose response better (lower peak effects)   see avs for instructions unique to this ov

## 2017-06-27 NOTE — Patient Instructions (Addendum)
Add Pantoprazole Take 30-60 min before first meal of the day   GERD (REFLUX)  is an extremely common cause of respiratory symptoms just like yours , many times with no obvious heartburn at all.    It can be treated with medication, but also with lifestyle changes including elevation of the head of your bed (ideally with 6 inch  bed blocks),  Smoking cessation, avoidance of late meals, excessive alcohol, and avoid fatty foods, chocolate, peppermint, colas, red wine, and acidic juices such as orange juice.  NO MINT OR MENTHOL PRODUCTS SO NO COUGH DROPS   USE SUGARLESS CANDY INSTEAD (Jolley ranchers or Stover's or Life Savers) or even ice chips will also do - the key is to swallow to prevent all throat clearing. NO OIL BASED VITAMINS - use powdered substitutes.    For drainage / throat tickle try take CHLORPHENIRAMINE  4 mg - take one every 4 hours as needed - available over the counter- may cause drowsiness so start with just a bedtime dose or two and see how you tolerate it before trying in daytime    Change toprol (metaprolol) one  Half twice daily     Please schedule a follow up office visit in 4 weeks, sooner if needed

## 2017-07-09 ENCOUNTER — Other Ambulatory Visit: Payer: Self-pay | Admitting: Internal Medicine

## 2017-07-12 ENCOUNTER — Other Ambulatory Visit: Payer: Self-pay | Admitting: Internal Medicine

## 2017-07-12 DIAGNOSIS — K219 Gastro-esophageal reflux disease without esophagitis: Secondary | ICD-10-CM

## 2017-07-25 ENCOUNTER — Encounter: Payer: Self-pay | Admitting: Internal Medicine

## 2017-07-25 ENCOUNTER — Ambulatory Visit (INDEPENDENT_AMBULATORY_CARE_PROVIDER_SITE_OTHER): Payer: Medicare Other | Admitting: Internal Medicine

## 2017-07-25 VITALS — BP 158/80 | HR 76 | Ht 67.5 in | Wt 184.0 lb

## 2017-07-25 DIAGNOSIS — R059 Cough, unspecified: Secondary | ICD-10-CM

## 2017-07-25 DIAGNOSIS — I10 Essential (primary) hypertension: Secondary | ICD-10-CM | POA: Diagnosis not present

## 2017-07-25 DIAGNOSIS — R05 Cough: Secondary | ICD-10-CM

## 2017-07-25 NOTE — Patient Instructions (Addendum)
Pantoprazole 40 mg  Take 30-60 min before first meal of the day and continue prilosec 30 min before your last   GERD (REFLUX)  is an extremely common cause of respiratory symptoms just like yours , many times with no obvious heartburn at all.    It can be treated with medication, but also with lifestyle changes including elevation of the head of your bed (ideally with 6 inch  bed blocks),  Smoking cessation, avoidance of late meals, excessive alcohol, and avoid fatty foods, chocolate, peppermint, colas, red wine, and acidic juices such as orange juice.  NO MINT OR MENTHOL PRODUCTS SO NO COUGH DROPS   USE SUGARLESS CANDY INSTEAD (Jolley ranchers or Stover's or Life Savers) or even ice chips will also do - the key is to swallow to prevent all throat clearing. NO OIL BASED VITAMINS - use powdered substitutes.    For drainage / throat tickle try take CHLORPHENIRAMINE  4 mg - take one every 4 hours as needed - available over the counter- may cause drowsiness so start with just a bedtime dose or two and see how you tolerate it before trying in daytime    If not satisfied call [facility] after Thanksgiving at [phone] to schedule an asthma challenge test   Please see patient coordinator before you leave today  to schedule sinus ct and we will call you with results

## 2017-07-25 NOTE — Progress Notes (Signed)
Subjective:     Patient ID: [name], female   DOB: [birth year],     MRN: [id number]    Brief patient profile:  79 yowf quit smoking in 1985 fine at the time retired Scientist, research (physical sciences) now a widow living independently at [facility] with cough x 2013 referred to pulmonary clinic 05/23/2017 by [name] for Dr [name].   History of Present Illness  05/23/2017 1st [name] office visit/ [name]   Chief Complaint  Patient presents with  . Pulmonary Consult    Referred by Dr. Sherrie [name]. Pt c/o cough x 5 years, worse x 1 1/2 yrs. Her cough comes and goes throughout the day and sometimes wakes her up in the night.   living at [facility] since 2001 new appt since 2004 and cough onset 2013 daily / dry hack only improved when moved to SNF temporarily ? If held cozar during that admit?  Doe x 150 ft flat slow pace with walker x 2 years no recent change  No noct sob but cough most every noct after asleep  Cough occurs in fits then starts sneezing with lot of nasal discharge lots of saline occ neo much better in rehab  No allergy or sinus eval to date    Kouffman Reflux v Neurogenic Cough Differentiator Reflux Comments  Do you awaken from a sound sleep coughing violently?                            With trouble breathing? Yes avg one per night    Do you have choking episodes when you cannot  Get enough air, gasping for air ?              No    Do you usually cough when you lie down into  The bed, or when you just lie down to rest ?                          No    Do you usually cough after meals or eating?         no   Do you cough when (or after) you bend over?    no   GERD SCORE     Kouffman Reflux v Neurogenic Cough Differentiator Neurogenic   Do you more-or-less cough all day long? sporadic   Does change of temperature make you cough? No    Does laughing or chuckling cause you to cough? no   Do fumes (perfume, automobile fumes, burned  Toast, etc.,) cause you to cough  ?      no   Does speaking, singing, or talking on the phone cause you to cough   ?               Sometimes    Neurogenic/Airway score      Tried ppi before supper x maybe 30 days  No better rec Dymista one twice daily each nostril x one month  Pepcid 20 mg at bedtime over the counter  For drainage / throat tickle try take CHLORPHENIRAMINE  4 mg - take one every 4 hours as needed - available over the counter- may cause drowsiness so start with just a bedtime dose or two and see how you tolerate it before trying in daytime   Stop losartan and replace with ibesartan 75 mg daily  GERD diet    06/27/2017  f/u ov/[name] re:  cough x 2013  Chief Complaint  Patient presents with  . Follow-up    Pt states her cough has improved some. She is having alot of clear nasal d/c.   after cough/ sneeze tends to have watery drainage  Sleeps fine now s noct cough p 1st gen H1 blockers at hs but only takes one all day, usually in am Cough Tends to peak in early afternoon  Denies  limited by breathing from desired activities  / very sedentary - lives in independent living but food is delivered, she does not walk to cafeteria  rec Add Pantoprazole Take 30-60 min before first meal of the day  GERD diet   For drainage / throat tickle try take CHLORPHENIRAMINE  4 mg - take one every 4 hours as needed - available over the counter- may cause drowsiness so start with just a bedtime dose or two and see how you tolerate it before trying in daytime   Change toprol (metaprolol) one  Half twice daily      07/25/2017  f/u ov/[name] re: cough x 2013 slt improved  Chief Complaint  Patient presents with  . Follow-up    Pt states cough "may be a little better".   not using ppi or dymsta consistently as rec  Noct cough eliminated, just having the daytime sporadic cough non prod s sob  No obvious day to day or daytime variability or assoc excess/ purulent sputum or mucus plugs or hemoptysis or cp or chest tightness,  subjective wheeze or overt sinus or hb symptoms. No unusual exp hx or h/o childhood pna/ asthma or knowledge of premature birth.  Sleeping ok flat without nocturnal  or early am exacerbation  of respiratory  c/o's or need for noct saba. Also denies any obvious fluctuation of symptoms with weather or environmental changes or other aggravating or alleviating factors except as outlined above   Current Allergies, Complete Past Medical History, Past Surgical History, Family History, and Social History were reviewed in [employer] record.  ROS  The following are not active complaints unless bolded Hoarseness, sore throat, dysphagia, dental problems, itching, sneezing,  nasal congestion or discharge of excess mucus or purulent secretions, ear ache,   fever, chills, sweats, unintended wt loss or wt gain, classically pleuritic or exertional cp,  orthopnea pnd or leg swelling, presyncope, palpitations, abdominal pain, anorexia, nausea, vomiting, diarrhea  or change in bowel habits or change in bladder habits, change in stools or change in urine, dysuria, hematuria,  rash, arthralgias, visual complaints, headache, numbness, weakness or ataxia or problems with walking or coordination,  change in mood/affect or memory.        Current Meds  Medication Sig  . acetaminophen (TYLENOL) 325 MG tablet Take 650 mg by mouth every 4 (four) hours as needed. For pain  . Azelastine-Fluticasone (DYMISTA) 137-50 MCG/ACT SUSP Place 1 spray into the nose 2 (two) times daily.  . chlorthalidone (HYGROTON) 25 MG tablet Take 25 mg by mouth daily as needed. (SBP >180 mmHg)   . ELIQUIS 5 MG TABS tablet TAKE 1 TABLET BY MOUTH TWICE DAILY  . flecainide (TAMBOCOR) 50 MG tablet TAKE 1 TABLET BY MOUTH TWICE DAILY FOR ATRIAL FIBRILLATION  . furosemide (LASIX) 20 MG tablet Take 1 tablet (20 mg total) by mouth as needed.  Marland Kitchen HYDROcodone-acetaminophen (NORCO/VICODIN) 5-325 MG tablet Take 1 tablet by mouth daily as needed  for moderate pain.   Marland Kitchen irbesartan (AVAPRO) 75 MG tablet Take 1 tablet (75 mg total)  by mouth daily.  . metoprolol succinate (TOPROL-XL) 100 MG 24 hr tablet One half twice daily  . omeprazole (PRILOSEC) 20 MG capsule TAKE 1 CAPSULE(20 MG) BY MOUTH DAILY BEFORE SUPPER  . ondansetron (ZOFRAN) 4 MG tablet Take 1 tablet (4 mg total) by mouth 2 (two) times daily as needed for nausea.  . pantoprazole (PROTONIX) 40 MG tablet Take 1 tablet (40 mg total) by mouth daily. Take 30-60 min before first meal of the day  . Phenylephrine HCl (NEO-SYNEPHRINE NA) Place 1 spray into the nose 2 (two) times daily as needed (congestion).  . sennosides-docusate sodium (SENOKOT-S) 8.6-50 MG tablet Take 1 tablet by mouth daily.  . sertraline (ZOLOFT) 25 MG tablet TAKE 2 TABLETS(50 MG) BY MOUTH DAILY  . sodium chloride (OCEAN) 0.65 % SOLN nasal spray Place 1 spray into both nostrils daily as needed for congestion.  Marland Kitchen zolpidem (AMBIEN) 5 MG tablet TAKE 1 TABLET BY MOUTH AT BEDTIME AS NEEDED FOR SLEEP                           Objective:   Physical Exam    w/c bound elderly wf nad occ thraot clearing    07/27/2017        184  06/27/2017        185  05/23/17 181 lb (82.1 kg)  05/19/17 180 lb 12.8 oz (82 kg)  04/29/17 183 lb (83 kg)    Vital signs reviewed  - Note on arrival 02 sats   94% RA and bp 158/80 with pulse 76    HEENT: nl dentition, turbinates bilaterally, and oropharynx. Nl external ear canals without cough reflex   NECK :  without JVD/Nodes/TM/ nl carotid upstrokes bilaterally   LUNGS: no acc muscle use,  Nl contour chest which is clear to A and P bilaterally without cough on insp or exp maneuvers   CV:  RRR  no s3 or murmur or increase in P2, and  1+ pitting lower ext  Edema wearing hose   ABD:  soft and nontender with nl inspiratory excursion in the supine position. No bruits or organomegaly appreciated, bowel sounds nl  MS:  Ext warm without deformities, calf tenderness, cyanosis  or clubbing No obvious joint restrictions   SKIN: warm and dry without lesions    NEURO:  alert, approp, nl sensorium with  no motor or cerebellar deficits apparent.    CXR PA and Lateral:   05/23/2017 :    I personally reviewed images and agree with radiology impression as follows:    No active cardiopulmonary disease.           Assessment:

## 2017-07-27 ENCOUNTER — Encounter: Payer: Self-pay | Admitting: Internal Medicine

## 2017-07-27 NOTE — Assessment & Plan Note (Signed)
Change losartan to avapro 05/23/2017 due to cough  - split toprol into divided doses 06/27/2017  = 100 mg one half bid   bp not ideal but reluctant to push lopressor harder > Strongly prefer in this setting: Bystolic, the most beta -1  selective Beta blocker available in sample form, with bisoprolol the most selective generic choice  on the market.   Follow up per Primary Care planned

## 2017-07-27 NOTE — Assessment & Plan Note (Signed)
Onset 2013  - Trial off losartan 05/23/2017 and on 1st gen h1/ dymista > improved 06/27/2017 50%  - Allergy profile 05/23/2017 >  Eos 0.1/  IgE  19  RAST neg - added ppi qam 06/27/2017 and increase daytime 1st gen H1 blockers per guidelines  > but did not follow instructions consistently  - CT sinus  07/25/2017   Lack of cough resolution on a verified empirical regimen(which we have yet to accomplish) could mean an alternative diagnosis (eg occult asthma or eos bronchitis) , persistence of the disease state (eg sinusitis- which we have not excluded here or bronchiectasis- which seems unlikely clinically) , or inadequacy of currently available therapy (eg no medical rx available for non-acid gerd)   The standardized cough guidelines published in Chest by Lissa Morales in 2006 are still the best available and consist of a multiple step process (up to 12!) , not a single office visit,  and are intended  to address this problem logically,  with an alogrithm dependent on response to empiric treatment at  each progressive step  to determine a specific diagnosis with  minimal addtional testing needed. Therefore if adherence is an issue or can't be accurately verified,  it's very unlikely the standard evaluation and treatment will be successful here.    Furthermore, response to therapy (other than acute cough suppression, which should only be used short term with avoidance of narcotic containing cough syrups if possible), can be a gradual process for which the patient is not likely to  perceive immediate benefit.  Unlike going to an eye doctor where the best perscription is almost always the first one and is immediately effective, this is almost never the case in the management of chronic cough syndromes. Therefore the patient needs to commit up front to consistently adhere to recommendations  for up to 6 weeks of therapy directed at the likely underlying problem(s) before the response can be reasonably evaluated.    Will proceed with sinus ct then consider MCT next if cough persists.  I had an extended discussion with the patient reviewing all relevant studies completed to date and  lasting 15 to 20 minutes of a 25 minute visit    Each maintenance medication was reviewed in detail including most importantly the difference between maintenance and prns and under what circumstances the prns are to be triggered using an action plan format that is not reflected in the computer generated alphabetically organized AVS.    Please see AVS for specific instructions unique to this visit that I personally wrote and verbalized to the the pt in detail and then reviewed with pt  by my nurse highlighting any  changes in therapy recommended at today's visit to their plan of care.

## 2017-08-01 ENCOUNTER — Ambulatory Visit (INDEPENDENT_AMBULATORY_CARE_PROVIDER_SITE_OTHER)
Admission: RE | Admit: 2017-08-01 | Discharge: 2017-08-01 | Disposition: A | Discharge status: Home / Self Care | Payer: Medicare Other | Source: Ambulatory Visit | Attending: Internal Medicine | Admitting: Internal Medicine

## 2017-08-01 DIAGNOSIS — R05 Cough: Secondary | ICD-10-CM

## 2017-08-01 DIAGNOSIS — J320 Chronic maxillary sinusitis: Secondary | ICD-10-CM | POA: Diagnosis not present

## 2017-08-01 DIAGNOSIS — R059 Cough, unspecified: Secondary | ICD-10-CM

## 2017-08-01 NOTE — Progress Notes (Signed)
Spoke with pt and notified of results per Dr. Wert. Pt verbalized understanding and denied any questions. 

## 2017-08-06 ENCOUNTER — Encounter: Payer: Self-pay | Admitting: Internal Medicine

## 2017-08-06 ENCOUNTER — Non-Acute Institutional Stay: Payer: Medicare Other | Admitting: Internal Medicine

## 2017-08-06 VITALS — BP 158/80 | HR 60 | Temp 97.8°F | Wt 182.0 lb

## 2017-08-06 DIAGNOSIS — K219 Gastro-esophageal reflux disease without esophagitis: Secondary | ICD-10-CM

## 2017-08-06 DIAGNOSIS — I10 Essential (primary) hypertension: Secondary | ICD-10-CM | POA: Diagnosis not present

## 2017-08-06 DIAGNOSIS — F325 Major depressive disorder, single episode, in full remission: Secondary | ICD-10-CM | POA: Diagnosis not present

## 2017-08-06 DIAGNOSIS — D6869 Other thrombophilia: Secondary | ICD-10-CM

## 2017-08-06 DIAGNOSIS — N3946 Mixed incontinence: Secondary | ICD-10-CM

## 2017-08-06 DIAGNOSIS — I480 Paroxysmal atrial fibrillation: Secondary | ICD-10-CM | POA: Diagnosis not present

## 2017-08-06 DIAGNOSIS — I4891 Unspecified atrial fibrillation: Secondary | ICD-10-CM

## 2017-08-06 MED ORDER — NEBIVOLOL HCL 10 MG PO TABS: 10.0000 mg | ORAL_TABLET | Freq: Every day | ORAL | 3 refills | Status: DC

## 2017-08-06 MED ORDER — FAMOTIDINE 20 MG PO TABS
20.0000 mg | ORAL_TABLET | Freq: Every day | ORAL | 5 refills | Status: DC
Start: 2017-08-06 — End: 2017-12-10

## 2017-08-06 NOTE — Progress Notes (Signed)
Location:  [facility] psychologist of Service:  Clinic (12)  Provider: Nysia Dell L. Mariea Clonts, D.O., C.M.D.  Code Status: DNR Goals of Care:  Advanced Directives 08/06/2017  Does Patient Have a Medical Advance Directive? Yes  Type of Advance Directive Out of facility DNR (pink MOST or yellow form);Healthcare Power of Attorney  Does patient want to make changes to medical advance directive? No - Patient declined  Copy of [place] in Chart? Yes  Pre-existing out of facility DNR order (yellow form or pink MOST form) Yellow form placed in chart (order not valid for inpatient use)   Chief Complaint  Patient presents with  . Medical Management of Chronic Issues    71mth follow-up    HPI: Patient is a 83 y.o. female seen today for medical management of chronic diseases.    BP elevated. She just took her medication before coming and walked all the way in from her caregiver's car at the entry to health care.  Had to take her as needed chlorthalidone b/c bp was 180 "the other day"--takes 1-2 times per month.    She's become more incontinent--uses pad inside her depend.  Uses overnight. Tried myrbetriq before without success.  Is on chlorthalidone diuretic.    Says her hair is falling out.  She wants to ease off the zoloft.  Is on 50mg .  Noticed the hair loss since starting it.    Is now on ambien 5mg  at bedtime since rehab. She was sleeping fine in rehab.  She now tells me that I tricked her and it does not work as well.  Discussed reasons for the change (which I have recommended in the past).    Cough:  Had CT sinuses.  Not an allergic person.    Losartan was changed to avapro 8/31 due to cough.  Toprol was split into 100mg  one half bid.She is not faithful about her protonix.  Winds up taking it with the meal or immediately before it.  Taking protonix in am and prilosec in the evening as she remembers.  Also on ocean nasal spray, phenylephrine and dymista.  Still  has her dry cough.  Cough leads to sneezing which leads to clear mucus of copious amts. Also on pepcid once a day and takes it.    Back is doing fine.  Takes tylenol each morning.  Has not needed hydrocodone except for a bad headache two weeks ago.    Still gets nausea that sweeps over her, get chills and has feeling of impending doom.  Says it doesn't happen often.  zofran does not help.   Already on too many meds and not a good benzo candidate with her alcohol use.    Past Medical History:  Diagnosis Date  . A-fib ([place])   . Abdominal pain, unspecified site   . Abnormality of gait 06/02/2013  . Arthritis   . Cataracts, bilateral   . Depression   . Diverticulosis of colon (without mention of hemorrhage)   . Diverticulosis of colon (without mention of hemorrhage) 10/27/2012  . Edema 10/27/2012  . GI bleed 06/2002  . Hemorrhage of gastrointestinal tract, unspecified   . History of stomach ulcers   . Hypertension   . Insomnia, unspecified 10/27/2012  . Localized osteoarthrosis not specified whether primary or secondary, lower leg 05/31/2011  . Migraines   . Nausea alone 10/28/2012  . Obesity, unspecified 10/27/2012  . Osteoarthrosis, unspecified whether generalized or localized, lower leg   . Other  acariasis    peripherial neuropathy  . Other and unspecified alcohol dependence, continuous drinking behavior 10/28/2012  . Palpitations   . PVC (premature ventricular contraction)   . Unspecified hereditary and idiopathic peripheral neuropathy   . UTI (lower urinary tract infection)    pt state uti w/ no pain    Past Surgical History:  Procedure Laterality Date  . ARTHROSCOPIC REPAIR ACL    . BREAST BIOPSY  1973   left  . BREAST REDUCTION SURGERY  12/23/2003  . cataracts    . CHOLECYSTECTOMY  2005  . TONSILLECTOMY AND ADENOIDECTOMY  1941    Allergies  Allergen Reactions  . Mirtazapine     tremor  . Nickel Other (See Comments)    inflammation  . Other Itching    Acrylic nails  .  Prednisone     Causes sleep disturbances     Outpatient Encounter Medications as of 08/06/2017  Medication Sig  . acetaminophen (TYLENOL) 325 MG tablet Take 650 mg by mouth every 4 (four) hours as needed. For pain  . Azelastine-Fluticasone (DYMISTA) 137-50 MCG/ACT SUSP Place 1 spray into the nose 2 (two) times daily.  . chlorthalidone (HYGROTON) 25 MG tablet Take 25 mg by mouth daily as needed. (SBP >180 mmHg)   . ELIQUIS 5 MG TABS tablet TAKE 1 TABLET BY MOUTH TWICE DAILY  . flecainide (TAMBOCOR) 50 MG tablet TAKE 1 TABLET BY MOUTH TWICE DAILY FOR ATRIAL FIBRILLATION  . furosemide (LASIX) 20 MG tablet Take 1 tablet (20 mg total) by mouth as needed.  Marland Kitchen HYDROcodone-acetaminophen (NORCO/VICODIN) 5-325 MG tablet Take 1 tablet by mouth daily as needed for moderate pain.   Marland Kitchen irbesartan (AVAPRO) 75 MG tablet Take 1 tablet (75 mg total) by mouth daily.  . metoprolol succinate (TOPROL-XL) 100 MG 24 hr tablet One half twice daily  . omeprazole (PRILOSEC) 20 MG capsule TAKE 1 CAPSULE(20 MG) BY MOUTH DAILY BEFORE SUPPER  . ondansetron (ZOFRAN) 4 MG tablet Take 1 tablet (4 mg total) by mouth 2 (two) times daily as needed for nausea.  . pantoprazole (PROTONIX) 40 MG tablet Take 1 tablet (40 mg total) by mouth daily. Take 30-60 min before first meal of the day  . Phenylephrine HCl (NEO-SYNEPHRINE NA) Place 1 spray into the nose 2 (two) times daily as needed (congestion).  . sennosides-docusate sodium (SENOKOT-S) 8.6-50 MG tablet Take 1 tablet by mouth daily.  . sertraline (ZOLOFT) 25 MG tablet TAKE 2 TABLETS(50 MG) BY MOUTH DAILY  . sodium chloride (OCEAN) 0.65 % SOLN nasal spray Place 1 spray into both nostrils daily as needed for congestion.  Marland Kitchen zolpidem (AMBIEN) 5 MG tablet TAKE 1 TABLET BY MOUTH AT BEDTIME AS NEEDED FOR SLEEP   No facility-administered encounter medications on file as of 08/06/2017.     Review of Systems:  Review of Systems  Constitutional: Positive for malaise/fatigue. Negative  for chills and fever.  HENT: Negative for congestion.   Eyes: Negative for blurred vision.  Respiratory: Positive for cough. Negative for sputum production, shortness of breath and wheezing.   Cardiovascular: Negative for chest pain, palpitations and leg swelling.  Gastrointestinal: Positive for heartburn and nausea. Negative for abdominal pain, blood in stool, constipation and melena.  Genitourinary: Positive for frequency and urgency. Negative for dysuria, flank pain and hematuria.  Musculoskeletal: Positive for back pain.  Neurological: Negative for dizziness, loss of consciousness and weakness.  Psychiatric/Behavioral: Negative for depression and memory loss. The patient is nervous/anxious and has insomnia.  Health Maintenance  Topic Date Due  . DEXA SCAN  05/08/2000  . TETANUS/TDAP  09/23/2020  . INFLUENZA VACCINE  Completed  . PNA vac Low Risk Adult  Completed    Physical Exam: Vitals:   08/06/17 1125  Weight: 182 lb (82.6 kg)   Body mass index is 28.08 kg/m. Physical Exam  Constitutional: She is oriented to person, place, and time. She appears well-developed and well-nourished. No distress.  Cardiovascular: Intact distal pulses.  irreg irreg  Pulmonary/Chest: Effort normal and breath sounds normal. No respiratory distress.  Abdominal: Soft. Bowel sounds are normal. She exhibits no distension. There is no tenderness.  Musculoskeletal: Normal range of motion.  Neurological: She is alert and oriented to person, place, and time.  Essential tremor  Skin: Skin is warm and dry.  Psychiatric: She has a normal mood and affect.    Labs reviewed: Basic Metabolic Panel: Recent Labs    03/05/17 1243 03/12/17 1050 04/22/17 0212 04/24/17 0500  NA 124* 134 133* 139  K 3.8 4.0 3.6 3.2*  CL 82* 90*  --   --   CO2 26 24  --   --   GLUCOSE 103* 118*  --   --   BUN 13 14 15 7   CREATININE 0.96 0.88 0.8 0.7  CALCIUM 9.0 9.5  --   --    Liver Function Tests: Recent Labs     01/21/17 04/22/17 0212  AST 17 20  ALT 14 13  ALKPHOS 104 86   No results for input(s): LIPASE, AMYLASE in the last 8760 hours. No results for input(s): AMMONIA in the last 8760 hours. CBC: Recent Labs    01/21/17 04/22/17 0212 05/23/17 1131  WBC 8.2 7.8 6.6  NEUTROABS  --   --  4.4  HGB 15.4 15.9 15.1*  HCT 48* 46 45.0  MCV  --   --  101.0*  PLT 230 226 234.0   Lipid Panel: Recent Labs    01/21/17  CHOL 224*  HDL 97*  LDLCALC 90  TRIG 186*   Lab Results  Component Value Date   HGBA1C 5.3 10/03/2015    Procedures since last visit: [place] Wo Cm  Result Date: 08/01/2017 CLINICAL DATA:  83 year old female with chronic nonproductive cough. Sinus drainage, nasal congestion, sneezing. EXAM: CT PARANASAL SINUS LIMITED WITHOUT CONTRAST TECHNIQUE: Non-contiguous multidetector CT images of the paranasal sinuses were obtained in a single plane without contrast. COMPARISON:  Head CT without contrast 12/04/2015. FINDINGS: Visible noncontrast brain parenchyma appears stable since 2017. Calcified atherosclerosis at the skull base. Visible orbit and scalp soft tissues also appear stable. Negative visible noncontrast deep soft tissue spaces of the neck. Visible bilateral tympanic cavities, mastoid air cells, and also some petrous apex air cells are well pneumatized. The bilateral paranasal sinuses are well pneumatized. There is chronic right maxillary sinus mucoperiosteal thickening, with a diminutive size of that sinus. This is unchanged since 2017. No sinus fluid levels or bubbly opacity. Grossly negative visible nasal cavity ; there is rightward nasal septal deviation. IMPRESSION: 1. Evidence of chronic right maxillary sinusitis which is unchanged since 2017. 2. But other sinuses are clear and there is no superimposed acute inflammation identified; no CT evidence of acute sinusitis. Electronically Signed   By: Genevie Ann M.D.   On: 08/01/2017 12:31    Assessment/Plan 1.  Essential hypertension - at Dr. Gustavus Bryant recommendation and due to uncontrolled bp, stop metoprolol and start bystolic - nebivolol (BYSTOLIC) 10 MG tablet; Take 1  tablet (10 mg total) daily by mouth.  Dispense: 90 tablet; Refill: 3  2. Gastroesophageal reflux disease, esophagitis presence not specified -cont current regimen as ordered-be more faithful   3. Paroxysmal atrial fibrillation ([facility]) -rate controlled with bystolic,remains on long term flecainide  4. Hypercoagulable state due to atrial fibrillation ([place]) -continues on eliquis therapy, weight and renal function allow for this dose despite age  68. Major depressive disorder with single episode, in full remission ([place]) -try to taper zoloft to 50m daily for 2 weeks, then d/c -if not feeling well after stopping, may gradually restart the same way, but notify me first  6.  Urinary incontinence -worsening, needing pad in depends, did not benefit from meds in the past -not helped by diuretic, but it's just prn bp over 409 systolic which she has not needed  Labs/tests ordered: No orders of the defined types were placed in this encounter.  Next appt:  4 mos med mgt   Erynne Kealey L. Andrell Tallman, D.O. [facility] [address], [place] [address] Cell Phone (Mon-Fri 8am-5pm):  [phone] On Call:  [phone] & follow prompts after 5pm & weekends Office Phone:  [phone] Office Fax:  [phone]

## 2017-08-06 NOTE — Patient Instructions (Signed)
Stop metoprolol. Start bystolic 10mg  daily.  Decrease zoloft to 25mg  (one tab) daily for 2 weeks, then stop zoloft.

## 2017-08-08 ENCOUNTER — Other Ambulatory Visit: Payer: Self-pay | Admitting: Internal Medicine

## 2017-08-08 NOTE — Telephone Encounter (Signed)
rx called into pharmacy

## 2017-08-18 DIAGNOSIS — N3946 Mixed incontinence: Secondary | ICD-10-CM | POA: Insufficient documentation

## 2017-09-08 ENCOUNTER — Ambulatory Visit (INDEPENDENT_AMBULATORY_CARE_PROVIDER_SITE_OTHER): Payer: Medicare Other | Admitting: Nurse Practitioner

## 2017-09-08 ENCOUNTER — Encounter: Payer: Self-pay | Admitting: Nurse Practitioner

## 2017-09-08 VITALS — BP 120/82 | HR 67 | Temp 98.1°F | Ht 67.5 in | Wt 185.0 lb

## 2017-09-08 DIAGNOSIS — R319 Hematuria, unspecified: Secondary | ICD-10-CM

## 2017-09-08 DIAGNOSIS — N939 Abnormal uterine and vaginal bleeding, unspecified: Secondary | ICD-10-CM | POA: Diagnosis not present

## 2017-09-08 LAB — CBC WITH DIFFERENTIAL/PLATELET
BASOS ABS: 29 {cells}/uL (ref 0–200)
Basophils Relative: 0.4 %
EOS ABS: 58 {cells}/uL (ref 15–500)
EOS PCT: 0.8 %
HEMATOCRIT: 43.9 % (ref 35.0–45.0)
HEMOGLOBIN: 15.3 g/dL (ref 11.7–15.5)
LYMPHS ABS: 2073 {cells}/uL (ref 850–3900)
MCH: 32.8 pg (ref 27.0–33.0)
MCHC: 34.9 g/dL (ref 32.0–36.0)
MCV: 94.2 fL (ref 80.0–100.0)
MONOS PCT: 10.3 %
MPV: 9.8 fL (ref 7.5–12.5)
NEUTROS ABS: 4387 {cells}/uL (ref 1500–7800)
NEUTROS PCT: 60.1 %
Platelets: 243 10*3/uL (ref 140–400)
RBC: 4.66 10*6/uL (ref 3.80–5.10)
RDW: 13 % (ref 11.0–15.0)
Total Lymphocyte: 28.4 %
WBC mixed population: 752 cells/uL (ref 200–950)
WBC: 7.3 10*3/uL (ref 3.8–10.8)

## 2017-09-08 LAB — POCT URINALYSIS DIPSTICK
Bilirubin, UA: NEGATIVE
Glucose, UA: NEGATIVE
KETONES UA: NEGATIVE
NITRITE UA: POSITIVE
PH UA: 6 (ref 5.0–8.0)
PROTEIN UA: NEGATIVE
SPEC GRAV UA: 1.02 (ref 1.010–1.025)
UROBILINOGEN UA: 0.2 U/dL

## 2017-09-08 NOTE — Progress Notes (Signed)
Careteam: Patient Care Team: Gayland Curry, DO as PCP - General (Geriatric Medicine) Martinique, Peter M, MD as Attending Physician (Cardiology) [facility] Rosezella [place], MD as Consulting Physician (Neurology)  Advanced Directive information    Allergies  Allergen Reactions  . Mirtazapine     tremor  . Nickel Other (See Comments)    inflammation  . Other Itching    Acrylic nails  . Prednisone     Causes sleep disturbances     Chief Complaint  Patient presents with  . Acute Visit    Blood in urine or from vaginal area. Patient denies any other symptoms (abdominal pain or discomfort, no back pain out of the normal, no urgency, or frequency)      HPI: Patient is a 83 y.o. female seen in the office today due to blood in urine.  This morning used BSC and noticed blood in the toilet.  Has dysuria but this has been going on for years. no abdominal pain or fever.  Has urinary incontinence which comes with frequency and urgency but this is not new. Took a bath this morning and put a pad on. She noticed before visit there was blood on pad but no urine.  Hx of vaginal bleeding in the 41s. Had a D&C at that time. Has not seen GYN in years.   Review of Systems:  Review of Systems  Constitutional: Negative for chills and fever.  Gastrointestinal: Negative for abdominal pain, constipation and diarrhea.  Genitourinary: Positive for frequency, hematuria and urgency. Negative for dysuria and flank pain.  Musculoskeletal: Negative for myalgias.  Neurological: Negative for dizziness, weakness and headaches.    Past Medical History:  Diagnosis Date  . A-fib ([place])   . Abdominal pain, unspecified site   . Abnormality of gait 06/02/2013  . Arthritis   . Cataracts, bilateral   . Depression   . Diverticulosis of colon (without mention of hemorrhage)   . Diverticulosis of colon (without mention of hemorrhage) 10/27/2012  . Edema 10/27/2012  . GI bleed 06/2002  .  Hemorrhage of gastrointestinal tract, unspecified   . History of stomach ulcers   . Hypertension   . Insomnia, unspecified 10/27/2012  . Localized osteoarthrosis not specified whether primary or secondary, lower leg 05/31/2011  . Migraines   . Nausea alone 10/28/2012  . Obesity, unspecified 10/27/2012  . Osteoarthrosis, unspecified whether generalized or localized, lower leg   . Other acariasis    peripherial neuropathy  . Other and unspecified alcohol dependence, continuous drinking behavior 10/28/2012  . Palpitations   . PVC (premature ventricular contraction)   . Unspecified hereditary and idiopathic peripheral neuropathy   . UTI (lower urinary tract infection)    pt state uti w/ no pain   Past Surgical History:  Procedure Laterality Date  . ARTHROSCOPIC REPAIR ACL    . BREAST BIOPSY  1973   left  . BREAST REDUCTION SURGERY  12/23/2003  . cataracts    . CHOLECYSTECTOMY  2005  . TONSILLECTOMY AND ADENOIDECTOMY  1941   Social History:   reports that she quit smoking about 33 years ago. Her smoking use included cigarettes. She has a 30.00 pack-year smoking history. she has never used smokeless tobacco. She reports that she drinks alcohol. She reports that she does not use drugs.  History reviewed. No pertinent family history.  Medications:   Medication List        Accurate as of 09/08/17  4:09 PM. Always use your most  recent med list.          acetaminophen 325 MG tablet Commonly known as:  TYLENOL   Azelastine-Fluticasone 137-50 MCG/ACT Susp Commonly known as:  DYMISTA Place 1 spray into the nose 2 (two) times daily.   chlorthalidone 25 MG tablet Commonly known as:  HYGROTON   ELIQUIS 5 MG Tabs tablet Generic drug:  apixaban TAKE 1 TABLET BY MOUTH TWICE DAILY   famotidine 20 MG tablet Commonly known as:  PEPCID Take 1 tablet (20 mg total) daily by mouth.   flecainide 50 MG tablet Commonly known as:  TAMBOCOR TAKE 1 TABLET BY MOUTH TWICE DAILY FOR ATRIAL  FIBRILLATION   furosemide 20 MG tablet Commonly known as:  LASIX Take 1 tablet (20 mg total) by mouth as needed.   HYDROcodone-acetaminophen 5-325 MG tablet Commonly known as:  NORCO/VICODIN   irbesartan 75 MG tablet Commonly known as:  AVAPRO Take 1 tablet (75 mg total) by mouth daily.   nebivolol 10 MG tablet Commonly known as:  BYSTOLIC Take 1 tablet (10 mg total) daily by mouth.   NEO-SYNEPHRINE NA   omeprazole 20 MG capsule Commonly known as:  PRILOSEC TAKE 1 CAPSULE(20 MG) BY MOUTH DAILY BEFORE SUPPER   ondansetron 4 MG tablet Commonly known as:  ZOFRAN Take 1 tablet (4 mg total) by mouth 2 (two) times daily as needed for nausea.   pantoprazole 40 MG tablet Commonly known as:  PROTONIX Take 1 tablet (40 mg total) by mouth daily. Take 30-60 min before first meal of the day   sennosides-docusate sodium 8.6-50 MG tablet Commonly known as:  SENOKOT-S   sodium chloride 0.65 % Soln nasal spray Commonly known as:  OCEAN   zolpidem 5 MG tablet Commonly known as:  AMBIEN TAKE 1 TABLET BY MOUTH EVERY NIGHT AT BEDTIME AS NEEDED FOR SLEEP        Physical Exam:  Vitals:   09/08/17 1548  BP: 120/82  Pulse: 67  Temp: 98.1 F (36.7 C)  TempSrc: Oral  SpO2: 95%  Weight: 185 lb (83.9 kg)  Height: 5' 7.5" (1.715 m)   Body mass index is 28.55 kg/m.  Physical Exam  Constitutional: She is oriented to person, place, and time. She appears well-developed and well-nourished.  Cardiovascular: Normal rate, regular rhythm and normal heart sounds.  Pulmonary/Chest: Effort normal and breath sounds normal.  Abdominal: Soft. Bowel sounds are normal. She exhibits no distension. There is no tenderness.  Genitourinary: Rectum normal. Pelvic exam was performed with patient prone. There is bleeding in the vagina.  Genitourinary Comments: Exam limited due to tolerability   Neurological: She is alert and oriented to person, place, and time.  Skin: Skin is warm and dry.    Labs  reviewed: Basic Metabolic Panel: Recent Labs    03/05/17 1243 03/12/17 1050 04/22/17 0212 04/24/17 0500  NA 124* 134 133* 139  K 3.8 4.0 3.6 3.2*  CL 82* 90*  --   --   CO2 26 24  --   --   GLUCOSE 103* 118*  --   --   BUN 13 14 15 7   CREATININE 0.96 0.88 0.8 0.7  CALCIUM 9.0 9.5  --   --    Liver Function Tests: Recent Labs    01/21/17 04/22/17 0212  AST 17 20  ALT 14 13  ALKPHOS 104 86   No results for input(s): LIPASE, AMYLASE in the last 8760 hours. No results for input(s): AMMONIA in the last 8760 hours. CBC: Recent  Labs    01/21/17 04/22/17 0212 05/23/17 1131  WBC 8.2 7.8 6.6  NEUTROABS  --   --  4.4  HGB 15.4 15.9 15.1*  HCT 48* 46 45.0  MCV  --   --  101.0*  PLT 230 226 234.0   Lipid Panel: Recent Labs    01/21/17  CHOL 224*  HDL 97*  LDLCALC 90  TRIG 186*   TSH: No results for input(s): TSH in the last 8760 hours. A1C: Lab Results  Component Value Date   HGBA1C 5.3 10/03/2015     Assessment/Plan 1. Hematuria, unspecified type - POC Urinalysis Dipstick, after further evaluation appears to be vaginal bleeding not hematuria.   2. Vaginal bleeding -started today, visible blood noted to incontinence pad.  - Ambulatory referral to Gynecology- urgent  - CBC with Differential/Platelets  3. A fib -currently on eliquis, will cont on current elquis and monitor Hgb may need to hold if hgb trends down.    Carlos American. Harle Battiest  [facility] [phone] 8 am - 5 pm) [phone] (after hours)

## 2017-09-08 NOTE — Patient Instructions (Addendum)
Referral placed to GYN for evaluation of the bleeding.  Will get a blood count today Cont eliquis for now

## 2017-09-09 ENCOUNTER — Other Ambulatory Visit: Payer: Self-pay | Admitting: Internal Medicine

## 2017-09-09 DIAGNOSIS — N939 Abnormal uterine and vaginal bleeding, unspecified: Secondary | ICD-10-CM

## 2017-09-10 ENCOUNTER — Ambulatory Visit: Payer: Self-pay | Admitting: Nurse Practitioner

## 2017-09-11 ENCOUNTER — Ambulatory Visit (INDEPENDENT_AMBULATORY_CARE_PROVIDER_SITE_OTHER): Payer: Medicare Other | Admitting: Obstetrics & Gynecology

## 2017-09-11 ENCOUNTER — Encounter: Payer: Self-pay | Admitting: Obstetrics & Gynecology

## 2017-09-11 VITALS — BP 148/86 | Wt 187.0 lb

## 2017-09-11 DIAGNOSIS — R21 Rash and other nonspecific skin eruption: Secondary | ICD-10-CM | POA: Diagnosis not present

## 2017-09-11 DIAGNOSIS — Z124 Encounter for screening for malignant neoplasm of cervix: Secondary | ICD-10-CM

## 2017-09-11 DIAGNOSIS — N950 Postmenopausal bleeding: Secondary | ICD-10-CM

## 2017-09-11 DIAGNOSIS — N3946 Mixed incontinence: Secondary | ICD-10-CM | POA: Diagnosis not present

## 2017-09-11 DIAGNOSIS — R87610 Atypical squamous cells of undetermined significance on cytologic smear of cervix (ASC-US): Secondary | ICD-10-CM | POA: Diagnosis not present

## 2017-09-11 MED ORDER — CLOBETASOL PROPIONATE 0.05 % EX OINT: 1.0000 "application " | TOPICAL_OINTMENT | Freq: Two times a day (BID) | CUTANEOUS | 0 refills | Status: DC

## 2017-09-11 NOTE — Progress Notes (Signed)
[name] 26-Aug-1933 [id number]   History:    83 y.o. G0 widowed.  Presented accompanied by the daughter of her passed away husband.  RP: Postmenopausal bleeding for a few days recently  HPI: Menopausal for many years without hormone replacement therapy.  Had a few days of mild red to brownish tinged underwear recently.  No frank vaginal bleeding.  No pelvic pain.  Has a severe case of urinary incontinence with both stress incontinence and urgency.  Also has severe nocturnal incontinence of urine, where she wakes up in the middle of the night completely soaked.  Severe vulvar irritation for which she applies daily zinc oxide cream.  Patient is on Eliquis 5 mg daily.    Past medical history,surgical history, family history and social history were all reviewed and documented in the EPIC chart.  Gynecologic History No LMP recorded (lmp unknown). Patient is postmenopausal. Contraception: post menopausal status Last Pap: 6 years ago. Results were: normal Last mammogram: 2013. Results were: normal [place] 2011 No recent Bone Density  Obstetric History OB History  Gravida Para Term Preterm AB Living  0 0 0 0 0 0  SAB TAB Ectopic Multiple Live Births  0 0 0 0 0         ROS: A ROS was performed and pertinent positives and negatives are included in the history.  GENERAL: No fevers or chills. HEENT: No change in vision, no earache, sore throat or sinus congestion. NECK: No pain or stiffness. CARDIOVASCULAR: No chest pain or pressure. No palpitations. PULMONARY: No shortness of breath, cough or wheeze. GASTROINTESTINAL: No abdominal pain, nausea, vomiting or diarrhea, melena or bright red blood per rectum. GENITOURINARY: No urinary frequency, urgency, hesitancy or dysuria. MUSCULOSKELETAL: No joint or muscle pain, no back pain, no recent trauma. DERMATOLOGIC: No rash, no itching, no lesions. ENDOCRINE: No polyuria, polydipsia, no heat or cold intolerance. No recent change in weight.  HEMATOLOGICAL: No anemia or easy bruising or bleeding. NEUROLOGIC: No headache, seizures, numbness, tingling or weakness. PSYCHIATRIC: No depression, no loss of interest in normal activity or change in sleep pattern.     Exam:   BP (!) 148/86   Wt 187 lb (84.8 kg)   LMP  (LMP Unknown)   BMI 28.86 kg/m   Body mass index is 28.86 kg/m.  General appearance : Well developed well nourished female. No acute distress  Pelvic: Vulva: Severe erythema over whole vulva.  No discrete lesion.  B[place]olin, Urethra, Skene Glands: Within normal limits             Vagina: No gross lesions or discharge  Cervix: No gross lesions or discharge.  Pap reflex done  Uterus AV, normal size, shape and consistency, non-tender and mobile  Adnexa  Without masses or tenderness  Anus and perineum  normal      Assessment/Plan:  83 y.o. female for annual exam   1. Postmenopausal bleeding Mild postmenopausal bleeding which could be associated with vulvar irritation, but will rule out endometrial pathology with pelvic ultrasound.  Follow-up as soon as possible for pelvic ultrasound, endometrial biopsy if indicated.  Patient agrees with plan. - US Transvaginal Non-OB; Future  2. Screening for malignant neoplasm of cervix - Pap IG w/ reflex to HPV when ASC-U  3. Vulvar rash Probably secondary to severe urinary incontinence.  Will try to prevent irritation from urine as much as possible.  Clobetasol ointment prescribed.  Usage, risks and benefits reviewed.  4. Mixed stress and urge urinary incontinence Severe  mixed stress and urge urinary incontinence with severe nocturnal enuresis.  Decision to refer to Urology, Dr Matilde Sprang for evaluation, management and recommendations.  Other orders - clobetasol ointment (TEMOVATE) 0.05 %; Apply 1 application topically 2 (two) times daily.  Counseling on above issues more than 50% for 45 minutes.  Princess Bruins MD, 3:10 PM 09/11/2017

## 2017-09-14 ENCOUNTER — Other Ambulatory Visit: Payer: Self-pay | Admitting: Internal Medicine

## 2017-09-14 NOTE — Patient Instructions (Signed)
1. Postmenopausal bleeding Mild postmenopausal bleeding which could be associated with vulvar irritation, but will rule out endometrial pathology with pelvic ultrasound.  Follow-up as soon as possible for pelvic ultrasound, endometrial biopsy if indicated.  Patient agrees with plan. - US Transvaginal Non-OB; Future  2. Screening for malignant neoplasm of cervix - Pap IG w/ reflex to HPV when ASC-U  3. Vulvar rash Probably secondary to severe urinary incontinence.  Will try to prevent irritation from urine as much as possible.  Clobetasol ointment prescribed.  Usage, risks and benefits reviewed.  4. Mixed stress and urge urinary incontinence Severe mixed stress and urge urinary incontinence with severe nocturnal enuresis.  Decision to refer to Urology, Dr Matilde Sprang for evaluation, management and recommendations.  Other orders - clobetasol ointment (TEMOVATE) 0.05 %; Apply 1 application topically 2 (two) times daily.  [name], it was a pleasure meeting you today!  I will see you again soon for the pelvic ultrasound.

## 2017-09-17 ENCOUNTER — Telehealth: Payer: Self-pay | Admitting: *Deleted

## 2017-09-17 NOTE — Telephone Encounter (Signed)
-----   Message from Martha Bruins, MD sent at 09/11/2017  3:16 PM EST ----- Regarding: Refer to Urology/Dr MacDiarmid Severe Urinary incontinence with nocturnal Enuresis.

## 2017-09-17 NOTE — Telephone Encounter (Signed)
Referral faxed to [facility] they will contact pt to schedule. Pt aware.

## 2017-09-18 ENCOUNTER — Other Ambulatory Visit: Payer: Self-pay | Admitting: *Deleted

## 2017-09-18 MED ORDER — ZOLPIDEM TARTRATE 5 MG PO TABS: 5.0000 mg | ORAL_TABLET | Freq: Every evening | ORAL | 0 refills | Status: DC | PRN

## 2017-09-19 ENCOUNTER — Other Ambulatory Visit: Payer: Self-pay

## 2017-09-19 LAB — PAP IG W/ RFLX HPV ASCU

## 2017-09-19 LAB — HUMAN PAPILLOMAVIRUS, HIGH RISK: HPV DNA HIGH RISK: NOT DETECTED

## 2017-09-23 ENCOUNTER — Other Ambulatory Visit: Payer: Self-pay | Admitting: Internal Medicine

## 2017-09-24 ENCOUNTER — Other Ambulatory Visit: Payer: Self-pay | Admitting: Internal Medicine

## 2017-09-30 NOTE — Telephone Encounter (Signed)
Scheduled on 11/12/2017 @ 10:30am with Dr.MacDiarmid

## 2017-10-08 ENCOUNTER — Ambulatory Visit (INDEPENDENT_AMBULATORY_CARE_PROVIDER_SITE_OTHER): Payer: Medicare Other | Admitting: Obstetrics & Gynecology

## 2017-10-08 ENCOUNTER — Ambulatory Visit (INDEPENDENT_AMBULATORY_CARE_PROVIDER_SITE_OTHER): Payer: Medicare Other

## 2017-10-08 ENCOUNTER — Encounter: Payer: Self-pay | Admitting: Obstetrics & Gynecology

## 2017-10-08 DIAGNOSIS — N9089 Other specified noninflammatory disorders of vulva and perineum: Secondary | ICD-10-CM

## 2017-10-08 DIAGNOSIS — N950 Postmenopausal bleeding: Secondary | ICD-10-CM | POA: Diagnosis not present

## 2017-10-08 MED ORDER — CLOBETASOL PROPIONATE 0.05 % EX OINT: 1.0000 "application " | TOPICAL_OINTMENT | CUTANEOUS | 3 refills | Status: DC

## 2017-10-08 NOTE — Progress Notes (Signed)
    [name] Nov 11, 1932 [id number]        84 y.o.  G0  RP:  PMB for Pelvic US  HPI:  No recurrence of PMB x treatment of vulvitis on 09/11/2017.  At that visit on 09/11/2017 we noted:   Menopausal for many years without hormone replacement therapy.  Had a few days of mild red to brownish tinged underwear recently.  No frank vaginal bleeding.  No pelvic pain.  Has a severe case of urinary incontinence with both stress incontinence and urgency.  Also has severe nocturnal incontinence of urine, where she wakes up in the middle of the night completely soaked.  Severe vulvar irritation for which she applies daily zinc oxide cream.  Patient is on Eliquis 5 mg daily.     Past medical history,surgical history, problem list, medications, allergies, family history and social history were all reviewed and documented in the EPIC chart.  Directed ROS with pertinent positives and negatives documented in the history of present illness/assessment and plan.  Exam:  There were no vitals filed for this visit. General appearance:  Normal  Pelvic US today: T/V images.  Uterus anteverted, homogeneous measuring 3.93 x 2.45 x 1.54 cm.  Endometrial line normal at 1.7 mm.  Small amount of endometrial fluid measured at 1.3 x 0.2 x 1.4 cm.  Right and left ovaries not seen.  No apparent mass in right and left adnexa.  No free fluid in posterior cul-de-sac.   Assessment/Plan:  84 y.o. G0  1. Postmenopausal bleeding Thin normal endometrial lining at 1.7 mm.  No recurrence of postmenopausal bleeding after treating her vulvitis.  Patient reassured.  2. Vulvar irritation Vulvar irritation and inflammation much improved on clobetasol ointment.  Will represcribed the same today.  Recommend that patient does not use more than twice a week, applying a thin layer only on the affected skin.  Other orders - clobetasol ointment (TEMOVATE) 0.05 %; Apply 1 application topically 2 (two) times a week.  Counseling on above  issues more than 50% for 15 minutes.  Princess Bruins MD, 11:47 AM 10/08/2017

## 2017-10-12 ENCOUNTER — Encounter: Payer: Self-pay | Admitting: Obstetrics & Gynecology

## 2017-10-12 NOTE — Patient Instructions (Signed)
1. Postmenopausal bleeding Thin normal endometrial lining at 1.7 mm.  No recurrence of postmenopausal bleeding after treating her vulvitis.  Patient reassured.  2. Vulvar irritation Vulvar irritation and inflammation much improved on clobetasol ointment.  Will represcribed the same today.  Recommend that patient does not use more than twice a week, applying a thin layer only on the affected skin.  Other orders - clobetasol ointment (TEMOVATE) 0.05 %; Apply 1 application topically 2 (two) times a week.  [name], good seeing you today!

## 2017-10-16 ENCOUNTER — Other Ambulatory Visit: Payer: Self-pay | Admitting: Internal Medicine

## 2017-10-16 DIAGNOSIS — K219 Gastro-esophageal reflux disease without esophagitis: Secondary | ICD-10-CM

## 2017-10-20 ENCOUNTER — Other Ambulatory Visit: Payer: Self-pay | Admitting: Internal Medicine

## 2017-10-20 NOTE — Telephone Encounter (Signed)
rx called into pharmacy

## 2017-10-27 ENCOUNTER — Other Ambulatory Visit: Payer: Self-pay | Admitting: Internal Medicine

## 2017-11-12 ENCOUNTER — Encounter: Payer: Self-pay | Admitting: Internal Medicine

## 2017-11-12 DIAGNOSIS — R351 Nocturia: Secondary | ICD-10-CM | POA: Diagnosis not present

## 2017-11-12 DIAGNOSIS — N390 Urinary tract infection, site not specified: Secondary | ICD-10-CM | POA: Diagnosis not present

## 2017-11-12 DIAGNOSIS — N3944 Nocturnal enuresis: Secondary | ICD-10-CM | POA: Diagnosis not present

## 2017-11-12 DIAGNOSIS — N3946 Mixed incontinence: Secondary | ICD-10-CM | POA: Diagnosis not present

## 2017-11-12 DIAGNOSIS — R350 Frequency of micturition: Secondary | ICD-10-CM | POA: Diagnosis not present

## 2017-11-17 ENCOUNTER — Other Ambulatory Visit: Payer: Self-pay | Admitting: Internal Medicine

## 2017-11-23 ENCOUNTER — Other Ambulatory Visit: Payer: Self-pay | Admitting: Internal Medicine

## 2017-11-24 NOTE — Telephone Encounter (Signed)
rx called into pharmacy

## 2017-11-26 IMAGING — CT CT ABD-PELV W/ CM
2 of 5 series · 16 of 46 positions shown, 18 images · IV contrast (ISOVUE)
Comparison: 04/27/2016

CLINICAL DATA: Abdominal pain

EXAM:
CT ABDOMEN AND PELVIS WITH CONTRAST
TECHNIQUE: Multidetector CT imaging of the abdomen and pelvis was performed
using the standard protocol following bolus administration of
intravenous contrast.
CONTRAST:  100mL L3N3DT-C00 IOPAMIDOL (L3N3DT-C00) INJECTION 61%

[Series 2: abd/pel with · axial · 0.77mm/px · z∈[-435,-45]mm · 13 of 92 slices shown, 15 images]
[im 7/92  soft-tissue]
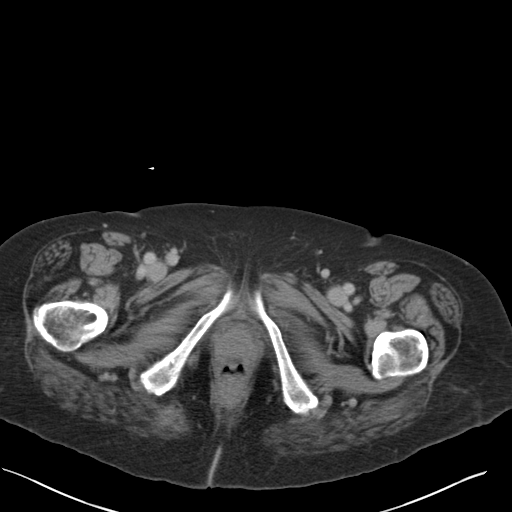
[im 7/92  bone]
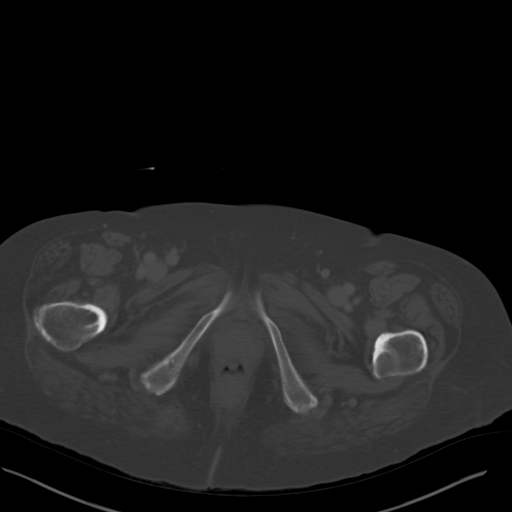
[im 13/92  soft-tissue]
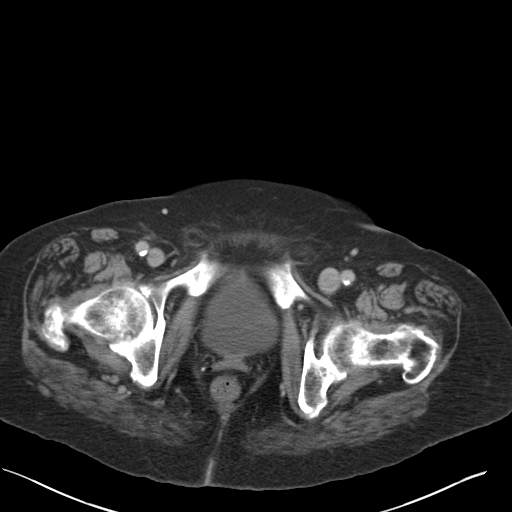
[im 19/92  soft-tissue]
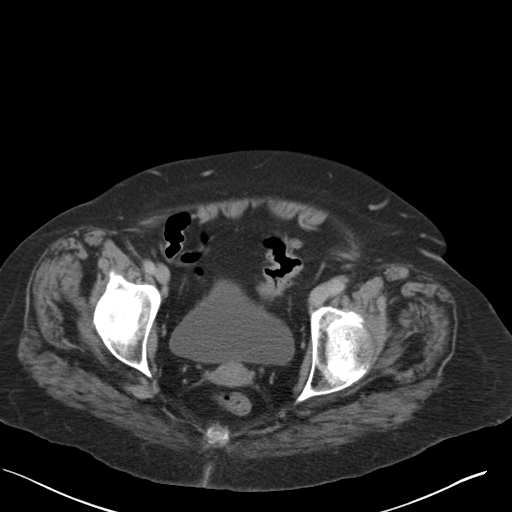
[im 25/92  soft-tissue]
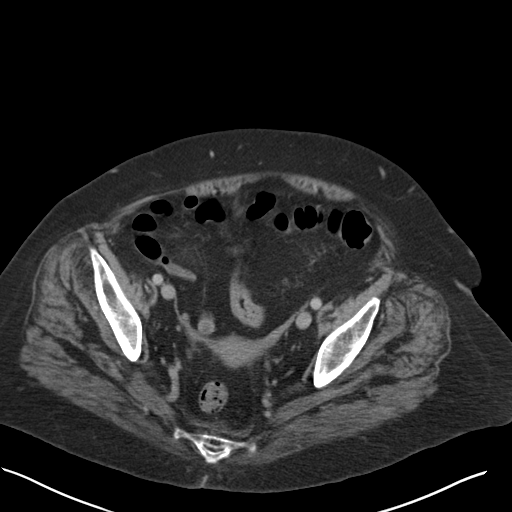
[im 31/92  soft-tissue]
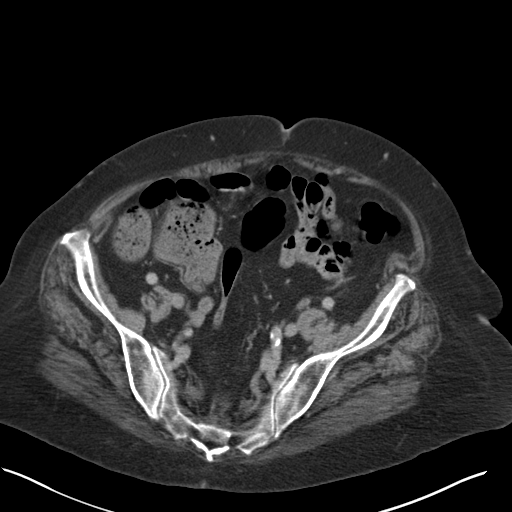
[im 37/92  soft-tissue]
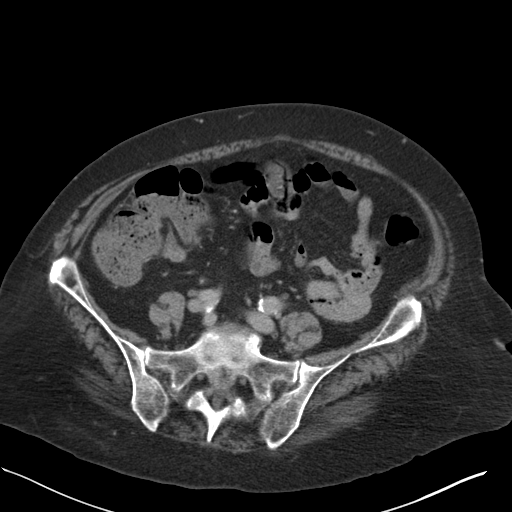
[im 49/92  soft-tissue]
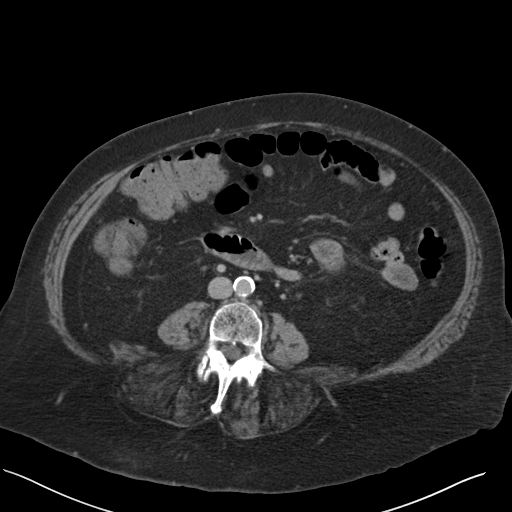
[im 55/92  soft-tissue]
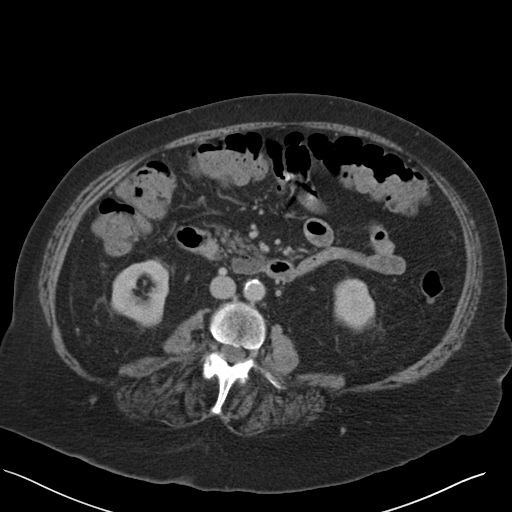
[im 61/92  soft-tissue]
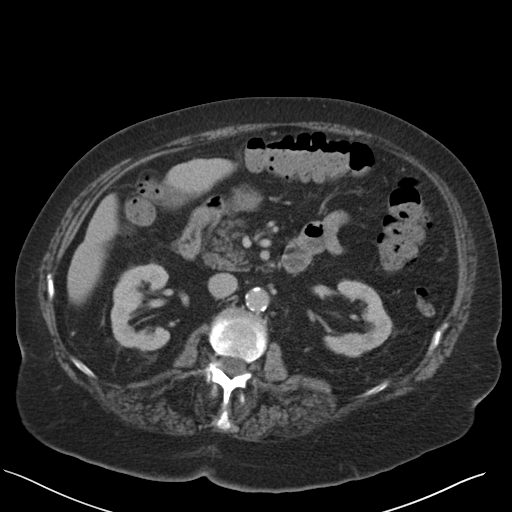
[im 61/92  bone]
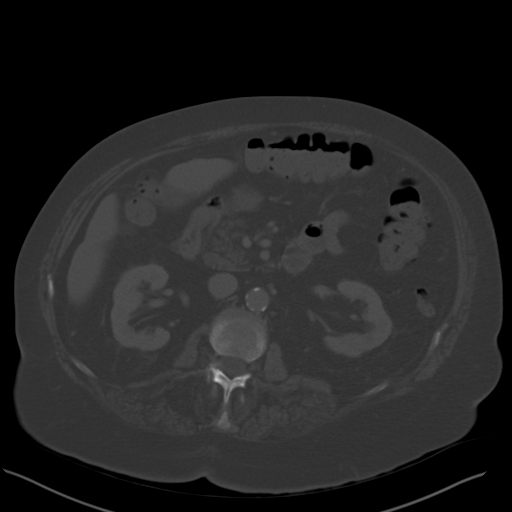
[im 67/92  soft-tissue]
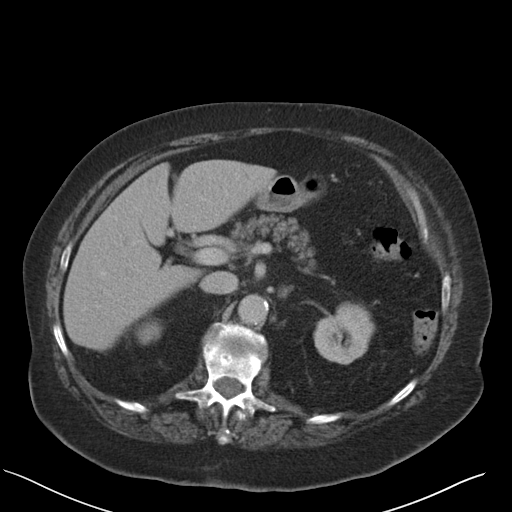
[im 73/92  soft-tissue]
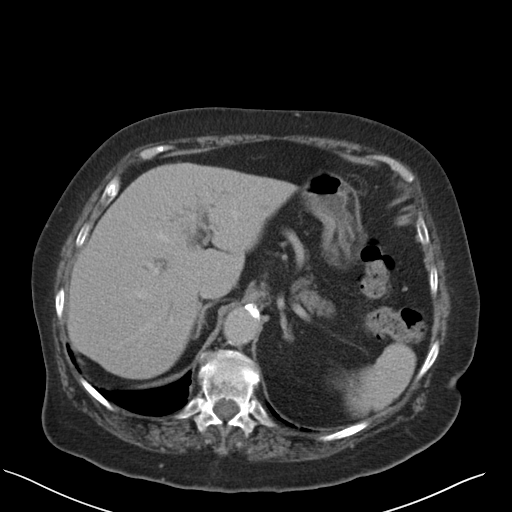
[im 79/92  soft-tissue]
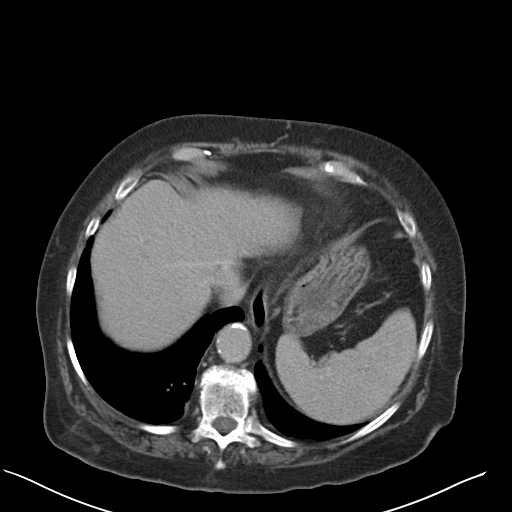
[im 85/92  soft-tissue]
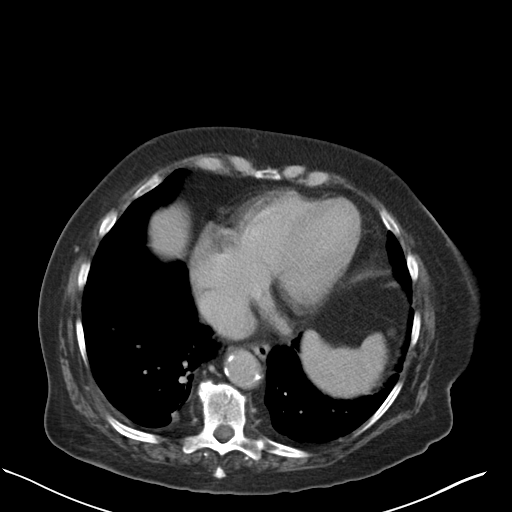

[Series 6: coronal a/|p · coronal · 0.80mm/px · 3 of 159 slices shown]
[im 53/159  soft-tissue]
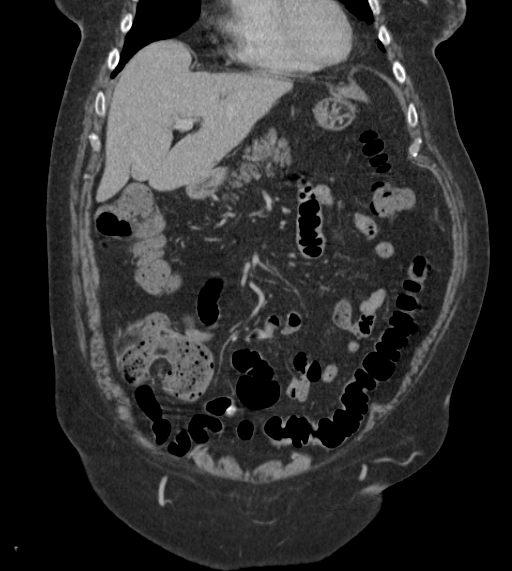
[im 71/159  soft-tissue]
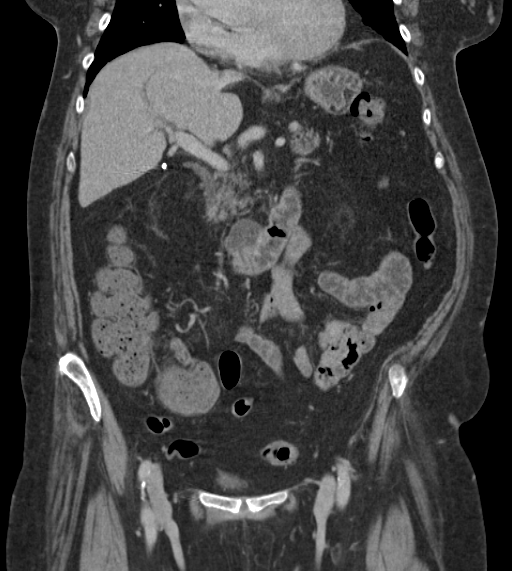
[im 88/159  soft-tissue]
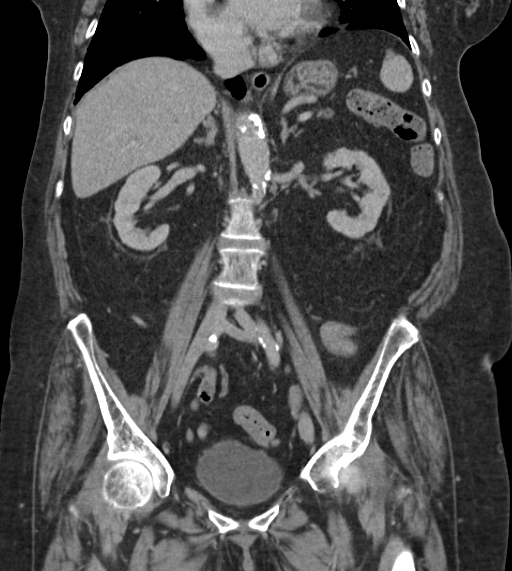

[16 of 46 positions shown; findings below may reference images not displayed]

FINDINGS: Lower chest:  The lung bases are unremarkable.

Hepatobiliary: Enhanced liver the shows minimal intrahepatic biliary
ductal dilatation no focal hepatic mass. The patient is status
postcholecystectomy.

Pancreas: Enhanced pancreas is unremarkable.

Spleen: Enhanced spleen is unremarkable.

Adrenals/Urinary Tract: No adrenal gland mass. Kidneys are
symmetrical in size and enhancement. Mild lobulated renal contour.
No hydronephrosis or hydroureter. No calcified ureteral calculi. The
urinary bladder is unremarkable.

Delayed renal images shows bilateral renal symmetrical excretion.
Bilateral visualized proximal ureter is unremarkable.

Stomach/Bowel: Study is limited without oral contrast. There is no
small bowel obstruction. No thickened or dilated small bowel loops.
Moderate stool noted within right colon and cecum. No pericecal
inflammation. There is a low lying cecum. The terminal ileum is
unremarkable. Some colonic gas and stool noted in transverse colon.
Some colonic gas noted in distal left colon and proximal sigmoid
colon. Redundant sigmoid colon. Moderate gas noted mid sigmoid
colon. No distal colonic obstruction. No acute colitis or
diverticulitis.

Vascular/Lymphatic: No aortic aneurysm. Atherosclerotic
calcifications of abdominal aorta and iliac arteries are noted. No
retroperitoneal or mesenteric adenopathy.

Reproductive: The uterus is atrophic. No adnexal mass. No pelvic
free fluid.

Other: There is no ascites or free abdominal air. Normal appendix
partially visualized in coronal image 86. No inguinal adenopathy.

Musculoskeletal: Sagittal images of the spine shows degenerative
changes thoracolumbar spine. Mild degenerative changes bilateral SI
joints.
IMPRESSION: 1. Status post cholecystectomy.
2. Moderate stool noted within right colon and cecum. No pericecal
inflammation. Low lying cecum. Normal appendix.
3. No small bowel obstruction.
4. Redundant sigmoid colon. Moderate gas noted within mid sigmoid
colon. No evidence of colitis or diverticulitis.
5. No hydronephrosis or hydroureter. No nephrolithiasis. No
calcified ureteral calculi.
6. Degenerative changes thoracic spine.

## 2017-11-26 IMAGING — CR DG ELBOW COMPLETE 3+V*L*
4 series · 4 of 4 positions shown · non-contrast
Comparison: None.

CLINICAL DATA: Initial evaluation for acute trauma, fall.

EXAM:
LEFT ELBOW - COMPLETE 3+ VIEW

[x elbow ap left]
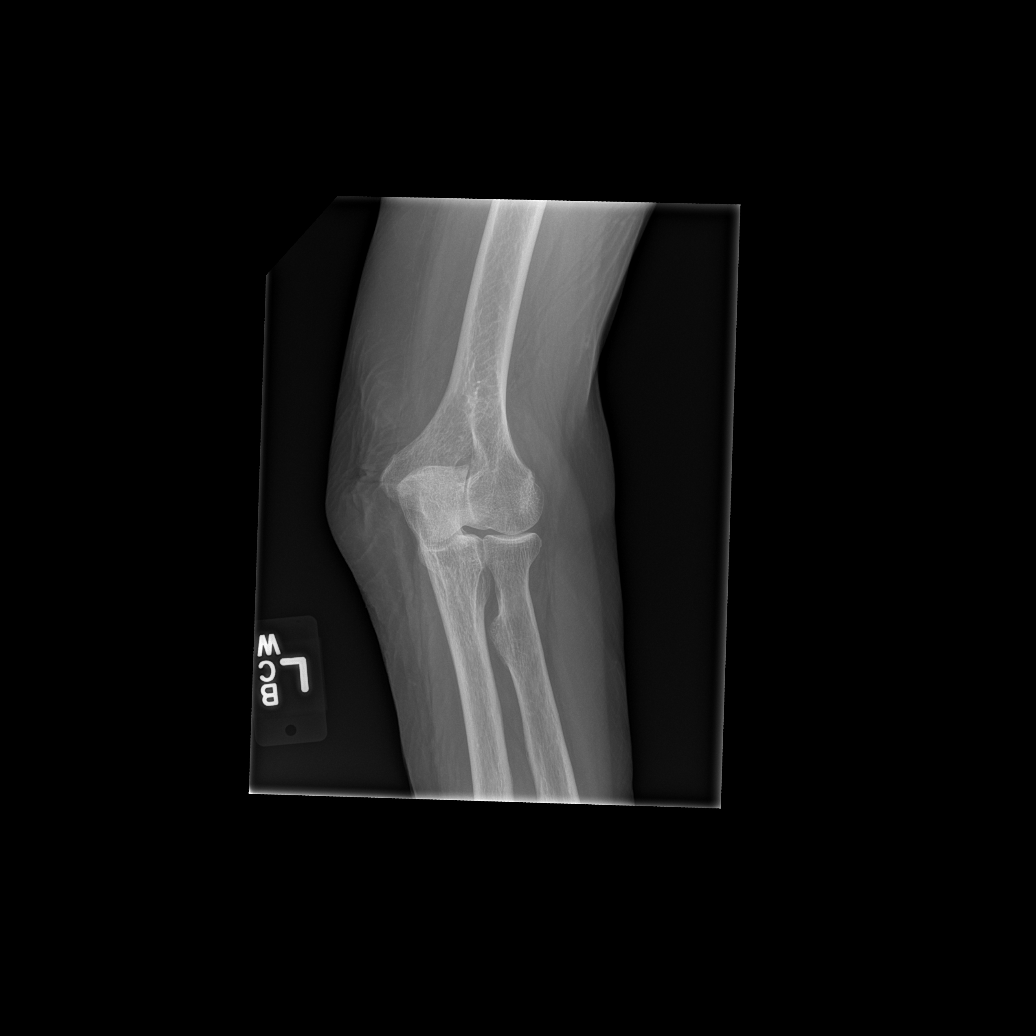

[x elbow obl left (1 of 2)]
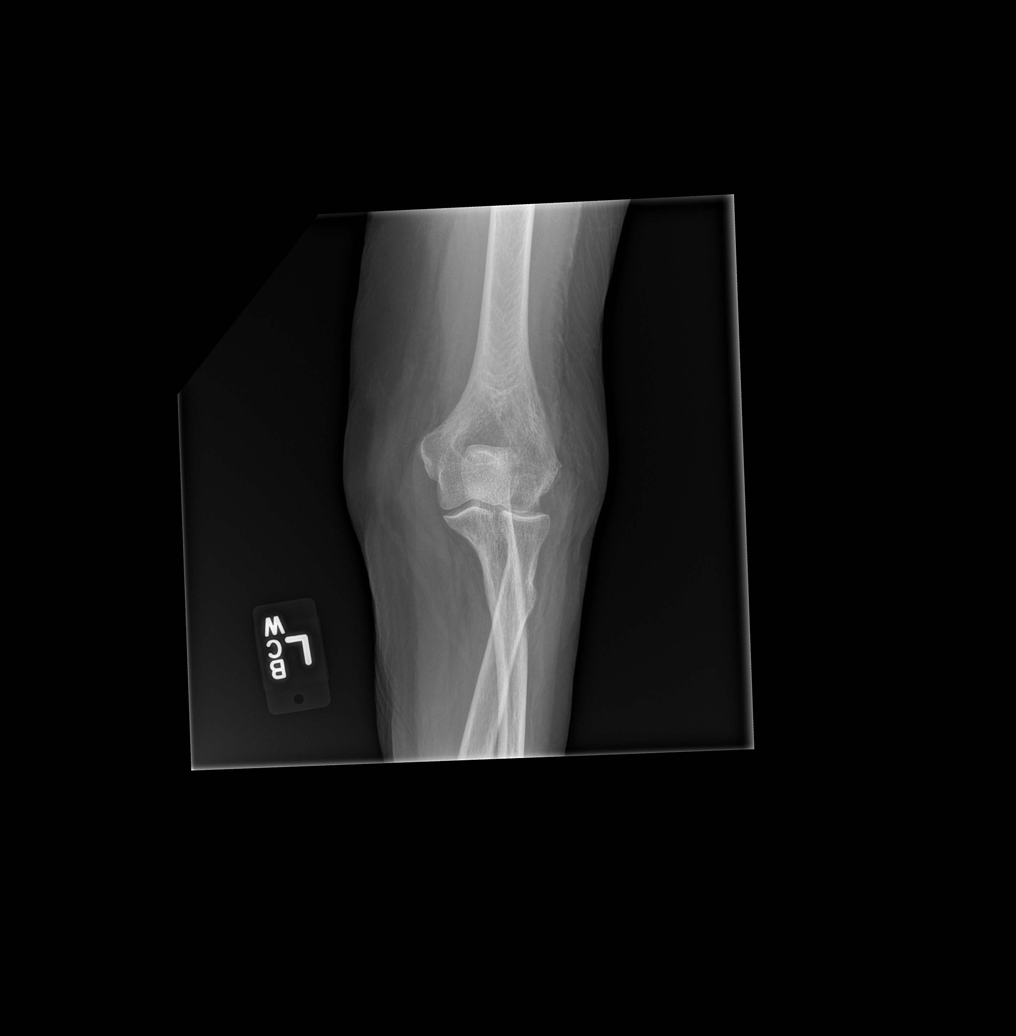

[x elbow obl left (2 of 2)]
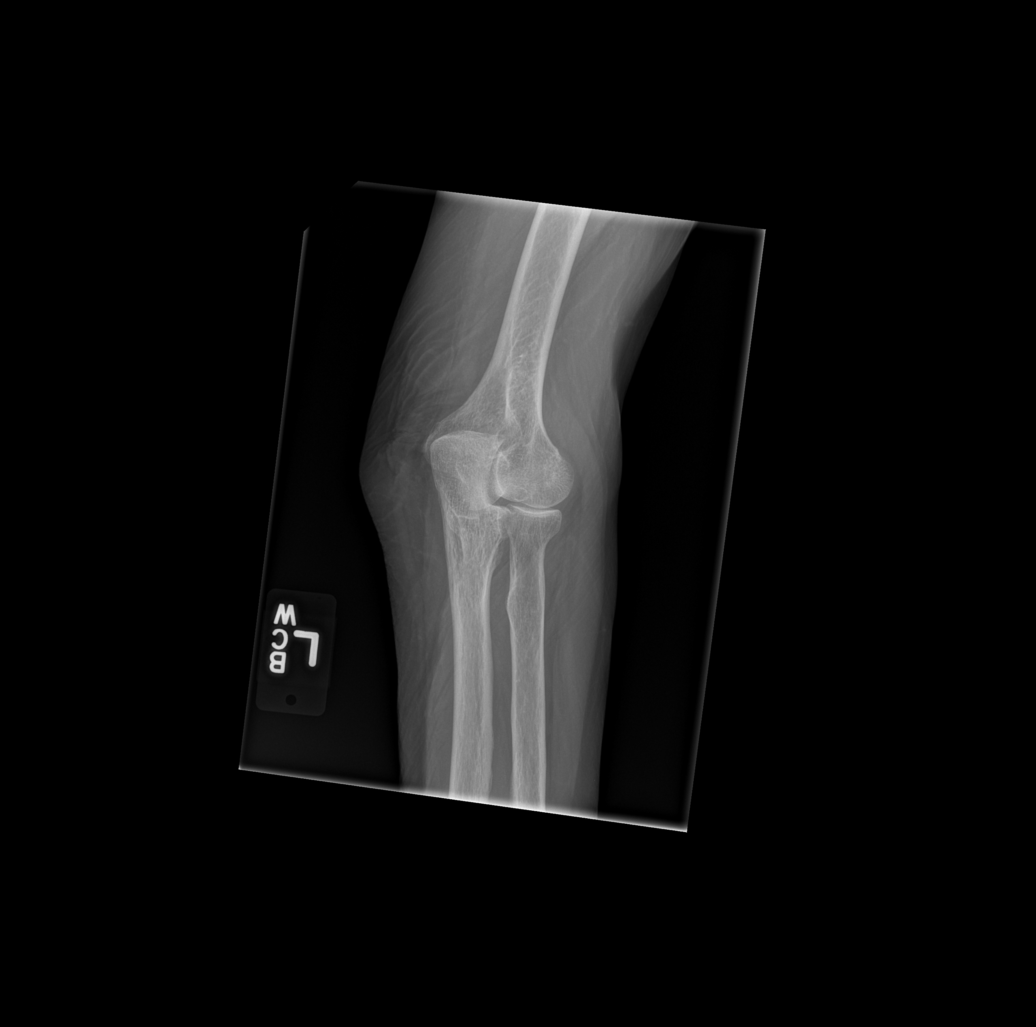

[x elbow lat left]
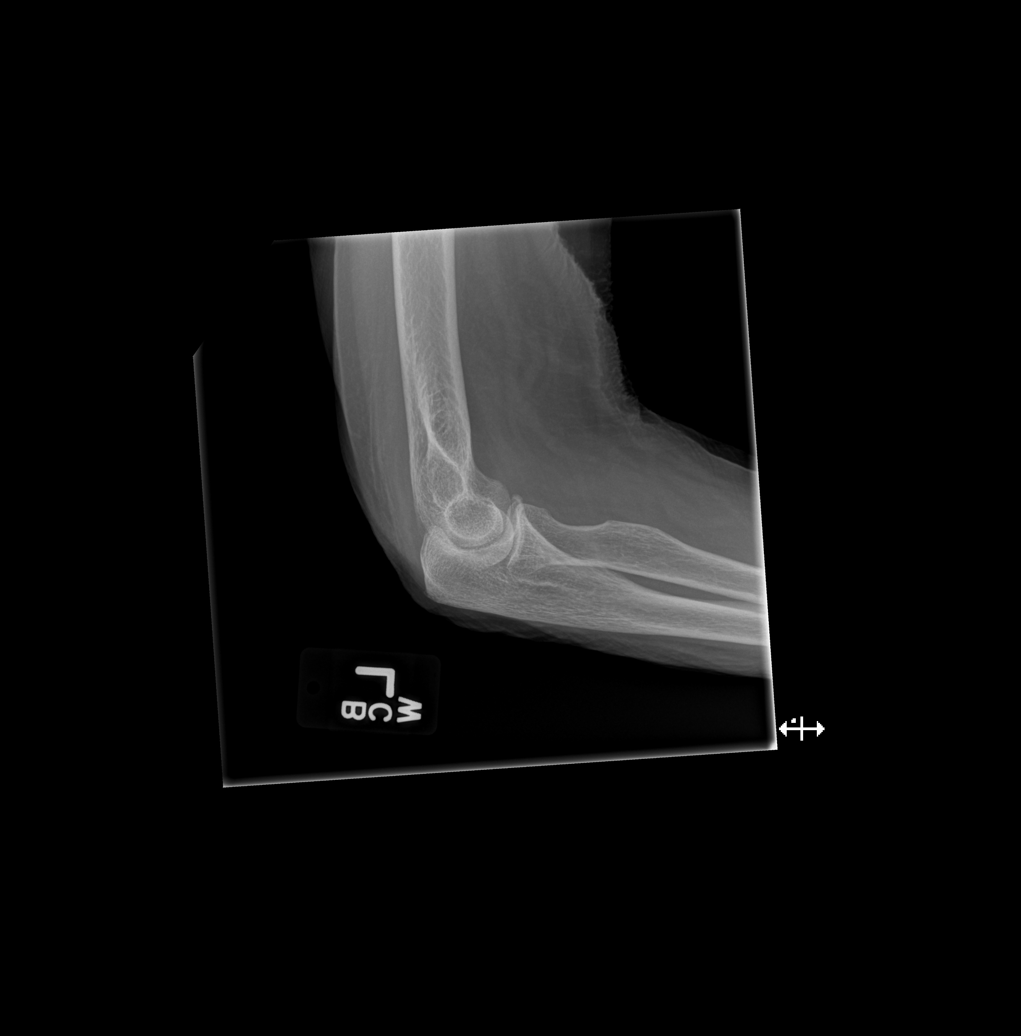

[4 of 4 positions shown; findings below may reference images not displayed]

FINDINGS: No acute fracture or dislocation. No joint effusion. Mild
degenerative spurring at the olecranon. No other significant
degenerative changes. Osseous mineralization within normal limits.
No acute soft tissue abnormality.
IMPRESSION: No acute osseous abnormality about the elbow.

## 2017-12-10 ENCOUNTER — Non-Acute Institutional Stay: Payer: Medicare Other | Admitting: Internal Medicine

## 2017-12-10 ENCOUNTER — Encounter: Payer: Self-pay | Admitting: Internal Medicine

## 2017-12-10 VITALS — BP 130/70 | HR 81 | Temp 97.7°F | Wt 188.0 lb

## 2017-12-10 DIAGNOSIS — I480 Paroxysmal atrial fibrillation: Secondary | ICD-10-CM

## 2017-12-10 DIAGNOSIS — F325 Major depressive disorder, single episode, in full remission: Secondary | ICD-10-CM

## 2017-12-10 DIAGNOSIS — I5032 Chronic diastolic (congestive) heart failure: Secondary | ICD-10-CM | POA: Diagnosis not present

## 2017-12-10 DIAGNOSIS — N939 Abnormal uterine and vaginal bleeding, unspecified: Secondary | ICD-10-CM

## 2017-12-10 DIAGNOSIS — G4700 Insomnia, unspecified: Secondary | ICD-10-CM | POA: Diagnosis not present

## 2017-12-10 DIAGNOSIS — D6869 Other thrombophilia: Secondary | ICD-10-CM | POA: Diagnosis not present

## 2017-12-10 DIAGNOSIS — I10 Essential (primary) hypertension: Secondary | ICD-10-CM | POA: Diagnosis not present

## 2017-12-10 DIAGNOSIS — N3941 Urge incontinence: Secondary | ICD-10-CM

## 2017-12-10 DIAGNOSIS — F1020 Alcohol dependence, uncomplicated: Secondary | ICD-10-CM

## 2017-12-10 DIAGNOSIS — I4891 Unspecified atrial fibrillation: Secondary | ICD-10-CM

## 2017-12-10 DIAGNOSIS — R110 Nausea: Secondary | ICD-10-CM

## 2017-12-10 MED ORDER — ONDANSETRON HCL 4 MG PO TABS: 4.0000 mg | ORAL_TABLET | Freq: Two times a day (BID) | ORAL | 0 refills | Status: DC | PRN

## 2017-12-10 MED ORDER — FAMOTIDINE 20 MG PO TABS: 20.0000 mg | ORAL_TABLET | Freq: Every day | ORAL | 5 refills | Status: DC

## 2017-12-10 NOTE — Patient Instructions (Addendum)
Increase tylenol to two tablets by mouth twice a day.

## 2017-12-10 NOTE — Progress Notes (Signed)
Location:  Occupational psychologist of Service:  Clinic (12)  Provider: Akiah Bauch L. Mariea Clonts, D.O., C.M.D.  Code Status: DNR Goals of Care:  Advanced Directives 12/10/2017  Does Patient Have a Medical Advance Directive? Yes  Type of Paramedic of [place];Living will;Out of facility DNR (pink MOST or yellow form)  Does patient want to make changes to medical advance directive? No - Patient declined  Copy of [place] in Chart? Yes  Pre-existing out of facility DNR order (yellow form or pink MOST form) Yellow form placed in chart (order not valid for inpatient use)     Chief Complaint  Patient presents with  . Medical Management of Chronic Issues    58mth follow-up    HPI: Patient is a 84 y.o. female seen today for medical management of chronic diseases.    No new things.  Reports things are all the same. Cough is still present. She does not think the myrbetriq has helped her bladder at all. She'd had samples that are about to run out.  She is still having leakage.  She still thinks she is going too often.    Has her tremor--does think it is a bit worse--glass shakes when she has her evening scotch.  Very occasional pvcs. No palpitations or signs of afib.   No increase in edema.  She is a little short of breath--she thinks she holds her breath and then has to catch up.  Not using her dymista.  Not taking her reflux medication.   Wishes she did not have to take eliquis b/c of the bad bruising.  She's tired and ambivalent.   Perrin Smack is doing well.   Says she might start using her tylenol more.  Has been taking it just once a day in the morning, but took one overnight for headache and she was much more mobile in the morning.   Out of hydrocodone that orthopedics gives her.   Using petroleum jelly instead of clobetasol. Bleeding was perineal irritation not overt postmenopausal bleeding.  Says her parts are marinating in urine.     Saw gyn and had pelvic ultrasound which did not reveal any worrisome pathology.    Past Medical History:  Diagnosis Date  . A-fib ([place])   . Abdominal pain, unspecified site   . Abnormality of gait 06/02/2013  . Arthritis   . Cataracts, bilateral   . Depression   . Diverticulosis of colon (without mention of hemorrhage)   . Diverticulosis of colon (without mention of hemorrhage) 10/27/2012  . Edema 10/27/2012  . GI bleed 06/2002  . Hemorrhage of gastrointestinal tract, unspecified   . History of stomach ulcers   . Hypertension   . Insomnia, unspecified 10/27/2012  . Localized osteoarthrosis not specified whether primary or secondary, lower leg 05/31/2011  . Migraines   . Nausea alone 10/28/2012  . Obesity, unspecified 10/27/2012  . Osteoarthrosis, unspecified whether generalized or localized, lower leg   . Other acariasis    peripherial neuropathy  . Other and unspecified alcohol dependence, continuous drinking behavior 10/28/2012  . Palpitations   . PVC (premature ventricular contraction)   . Unspecified hereditary and idiopathic peripheral neuropathy   . UTI (lower urinary tract infection)    pt state uti w/ no pain    Past Surgical History:  Procedure Laterality Date  . ARTHROSCOPIC REPAIR ACL    . BREAST BIOPSY  1973   left  . BREAST REDUCTION SURGERY  12/23/2003  .  cataracts    . CHOLECYSTECTOMY  2005  . TONSILLECTOMY AND ADENOIDECTOMY  1941    Allergies  Allergen Reactions  . Mirtazapine     tremor  . Nickel Other (See Comments)    inflammation  . Other Itching    Acrylic nails  . Prednisone     Causes sleep disturbances     Outpatient Encounter Medications as of 12/10/2017  Medication Sig  . acetaminophen (TYLENOL) 325 MG tablet Take 650 mg by mouth every 4 (four) hours as needed. For pain  . Azelastine-Fluticasone (DYMISTA) 137-50 MCG/ACT SUSP Place 1 spray into the nose 2 (two) times daily.  . chlorthalidone (HYGROTON) 25 MG tablet Take 25 mg by mouth daily as  needed. (SBP >180 mmHg)   . clobetasol ointment (TEMOVATE) 8.12 % Apply 1 application topically 2 (two) times a week.  Marland Kitchen ELIQUIS 5 MG TABS tablet TAKE 1 TABLET BY MOUTH TWICE DAILY  . famotidine (PEPCID) 20 MG tablet Take 1 tablet (20 mg total) daily by mouth.  . flecainide (TAMBOCOR) 50 MG tablet TAKE 1 TABLET BY MOUTH TWICE DAILY FOR ATRIAL FIBRILLATION  . furosemide (LASIX) 20 MG tablet Take 1 tablet (20 mg total) by mouth as needed.  Marland Kitchen HYDROcodone-acetaminophen (NORCO/VICODIN) 5-325 MG tablet Take 1 tablet by mouth daily as needed for moderate pain.   Marland Kitchen irbesartan (AVAPRO) 75 MG tablet Take 1 tablet (75 mg total) by mouth daily.  . nebivolol (BYSTOLIC) 10 MG tablet Take 1 tablet (10 mg total) daily by mouth.  Marland Kitchen omeprazole (PRILOSEC) 20 MG capsule TAKE 1 CAPSULE(20 MG) BY MOUTH DAILY BEFORE SUPPER  . ondansetron (ZOFRAN) 4 MG tablet Take 1 tablet (4 mg total) by mouth 2 (two) times daily as needed for nausea.  . pantoprazole (PROTONIX) 40 MG tablet TAKE 1 TABLET BY MOUTH DAILY 30 TO 60 MINUTES BEFORE FIRST MEAL OF THE DAY  . Phenylephrine HCl (NEO-SYNEPHRINE NA) Place 1 spray into the nose 2 (two) times daily as needed (congestion).  . sennosides-docusate sodium (SENOKOT-S) 8.6-50 MG tablet Take 1 tablet by mouth as needed.   . sodium chloride (OCEAN) 0.65 % SOLN nasal spray Place 1 spray into both nostrils daily as needed for congestion.  Marland Kitchen zolpidem (AMBIEN) 5 MG tablet TAKE 1 TABLET BY MOUTH AT BEDTIME AS NEEDED FOR SLEEP   No facility-administered encounter medications on file as of 12/10/2017.     Review of Systems:  Review of Systems  Constitutional: Positive for malaise/fatigue. Negative for chills and fever.  HENT: Negative for congestion.   Eyes: Negative for blurred vision.       Glasses  Respiratory: Positive for cough and shortness of breath. Negative for sputum production and wheezing.   Cardiovascular: Negative for chest pain, palpitations and leg swelling.    Gastrointestinal: Negative for abdominal pain, blood in stool, constipation, heartburn, melena, nausea and vomiting.  Genitourinary: Positive for frequency and urgency. Negative for dysuria, flank pain and hematuria.       Incontinent overnight  Musculoskeletal: Positive for back pain and joint pain. Negative for falls.  Skin: Negative for itching and rash.  Neurological: Positive for weakness. Negative for dizziness, loss of consciousness and headaches.  Endo/Heme/Allergies: Bruises/bleeds easily.  Psychiatric/Behavioral: Negative for depression and memory loss. The patient is nervous/anxious and has insomnia.        Drinks 1-2 scotches per night    Health Maintenance  Topic Date Due  . DEXA SCAN  05/08/2000  . TETANUS/TDAP  09/23/2020  . INFLUENZA VACCINE  Completed  . PNA vac Low Risk Adult  Completed    Physical Exam: Vitals:   12/10/17 1115  BP: 130/70  Pulse: 81  Temp: 97.7 F (36.5 C)  TempSrc: Oral  SpO2: 96%  Weight: 188 lb (85.3 kg)   Body mass index is 29.01 kg/m. Physical Exam  Constitutional: She is oriented to person, place, and time. She appears well-developed and well-nourished. No distress.  HENT:  Head: Normocephalic and atraumatic.  Eyes:  glasses  Cardiovascular: Normal rate, regular rhythm, normal heart sounds and intact distal pulses.  Pulmonary/Chest: Effort normal and breath sounds normal. No respiratory distress.  Abdominal: Soft. Bowel sounds are normal. She exhibits no distension. There is no tenderness.  Musculoskeletal: Normal range of motion.  Comes in wheelchair  Neurological: She is alert and oriented to person, place, and time.  Skin: Skin is warm and dry. Capillary refill takes less than 2 seconds.  Psychiatric: She has a normal mood and affect.    Labs reviewed: Basic Metabolic Panel: Recent Labs    03/05/17 1243 03/12/17 1050 04/22/17 0212 04/24/17 0500  NA 124* 134 133* 139  K 3.8 4.0 3.6 3.2*  CL 82* 90*  --   --   CO2  26 24  --   --   GLUCOSE 103* 118*  --   --   BUN 13 14 15 7   CREATININE 0.96 0.88 0.8 0.7  CALCIUM 9.0 9.5  --   --    Liver Function Tests: Recent Labs    01/21/17 04/22/17 0212  AST 17 20  ALT 14 13  ALKPHOS 104 86   No results for input(s): LIPASE, AMYLASE in the last 8760 hours. No results for input(s): AMMONIA in the last 8760 hours. CBC: Recent Labs    04/22/17 0212 05/23/17 1131 09/08/17 1631  WBC 7.8 6.6 7.3  NEUTROABS  --  4.4 4,387  HGB 15.9 15.1* 15.3  HCT 46 45.0 43.9  MCV  --  101.0* 94.2  PLT 226 234.0 243   Lipid Panel: Recent Labs    01/21/17  CHOL 224*  HDL 97*  LDLCALC 90  TRIG 186*   Lab Results  Component Value Date   HGBA1C 5.3 10/03/2015   Assessment/Plan 1. Nausea -intermittent, suspect related to her alcohol intake in the evenings and not taking food, some reflux which she's adamant she does not have -occasional zofran ok, but not daily - ondansetron (ZOFRAN) 4 MG tablet; Take 1 tablet (4 mg total) by mouth 2 (two) times daily as needed for nausea.  Dispense: 20 tablet; Refill: 0  2. Alcohol dependence, continuous ([place]) -continues to take her hs scotch, but says it's very watered down, has been counseled on mixing with ambien not recommended, but chooses to continue mixture  3. Chronic diastolic heart failure (HCC) -stable lately with current regimen, no changes made, monitor  4. Major depressive disorder with single episode, in full remission ([place]) -cont off medication, was on zoloft at one point, but requested to come off, mood doing fine  5. Paroxysmal atrial fibrillation (HCC) -cont eliquis therapy and rate is controlled, regular today  6. Hypercoagulable state due to atrial fibrillation (HCC) -cont eliquis, f/u cbc, cmp before next visit  7. Urge incontinence of urine -ongoing, reports no help with myrbetriq that I've also given her before w/o notable benefit, need notes from urology, they must have been sent to gyn  only  8. Vaginal bleeding -pelvic ultrasound ok with gyn, to be using topical antifungal  for her peri-area, but chose to use vaseline instead and says she was told this was ok by urologist  9. Essential hypertension -bp at goal with current regimen, cont same and monitor, no dizziness  10. Insomnia, unspecified type -I've reduced her ambien to 5mg  qhs which she reports was sneaky--again explained beer's criteria and risk of mixing with scotch  Note:  Med list updated and removed all meds pt reports not taking at all.  Kept prns.    Labs/tests ordered:  Cbc, cmp before Next appt:  4 mos med mgt, labs before  Faiz Weber L. Josue Kass, D.O. [facility] [address], [place] [address] Cell Phone (Mon-Fri 8am-5pm):  [phone] On Call:  [phone] & follow prompts after 5pm & weekends Office Phone:  [phone] Office Fax:  [phone]

## 2017-12-22 ENCOUNTER — Other Ambulatory Visit: Payer: Self-pay | Admitting: Internal Medicine

## 2017-12-23 ENCOUNTER — Other Ambulatory Visit: Payer: Self-pay | Admitting: *Deleted

## 2017-12-23 MED ORDER — ZOLPIDEM TARTRATE 5 MG PO TABS: 5.0000 mg | ORAL_TABLET | Freq: Every evening | ORAL | 0 refills | Status: DC | PRN

## 2017-12-23 NOTE — Telephone Encounter (Signed)
Patient requested refill on her Zolpidem [place] Verified LR:11/24/17 Pharmacy Confirmed.  Phoned Rx to pharmacy.

## 2018-01-17 ENCOUNTER — Other Ambulatory Visit: Payer: Self-pay | Admitting: Internal Medicine

## 2018-01-19 ENCOUNTER — Other Ambulatory Visit: Payer: Self-pay | Admitting: Internal Medicine

## 2018-01-21 ENCOUNTER — Other Ambulatory Visit: Payer: Self-pay | Admitting: Internal Medicine

## 2018-01-26 ENCOUNTER — Other Ambulatory Visit: Payer: Self-pay | Admitting: Internal Medicine

## 2018-02-19 ENCOUNTER — Other Ambulatory Visit: Payer: Self-pay | Admitting: Internal Medicine

## 2018-02-23 ENCOUNTER — Ambulatory Visit (INDEPENDENT_AMBULATORY_CARE_PROVIDER_SITE_OTHER): Payer: Medicare Other

## 2018-02-23 ENCOUNTER — Ambulatory Visit (INDEPENDENT_AMBULATORY_CARE_PROVIDER_SITE_OTHER): Payer: Medicare Other | Admitting: Physician Assistant

## 2018-02-23 ENCOUNTER — Encounter (INDEPENDENT_AMBULATORY_CARE_PROVIDER_SITE_OTHER): Payer: Self-pay | Admitting: Physician Assistant

## 2018-02-23 DIAGNOSIS — M170 Bilateral primary osteoarthritis of knee: Secondary | ICD-10-CM | POA: Diagnosis not present

## 2018-02-23 DIAGNOSIS — M25561 Pain in right knee: Secondary | ICD-10-CM | POA: Diagnosis not present

## 2018-02-23 DIAGNOSIS — M25562 Pain in left knee: Secondary | ICD-10-CM

## 2018-02-23 DIAGNOSIS — G8929 Other chronic pain: Secondary | ICD-10-CM

## 2018-02-23 MED ORDER — METHYLPREDNISOLONE ACETATE 40 MG/ML IJ SUSP
40.0000 mg | INTRAMUSCULAR | Status: AC | PRN
  Administered 2018-02-23: 40 mg via INTRA_ARTICULAR

## 2018-02-23 MED ORDER — HYDROCODONE-ACETAMINOPHEN 5-325 MG PO TABS: 1.0000 | ORAL_TABLET | Freq: Four times a day (QID) | ORAL | 0 refills | Status: DC | PRN

## 2018-02-23 MED ORDER — LIDOCAINE HCL 1 % IJ SOLN
0.5000 mL | INTRAMUSCULAR | Status: AC | PRN
  Administered 2018-02-23: .5 mL

## 2018-02-23 NOTE — Progress Notes (Signed)
   Procedure Note  Patient: [name]             Date of Birth: [birth year]           MRN: [id number]             Visit Date: 02/23/2018  HPI: [name] comes in today almost a year after her last bilateral knee injections.  She had no known injury to either knee.  She states that cortisone injections one year ago lasted briefly.  She is wondering what else can be done.  She is asking for refill on her hydrocodone which she has not had firmness in the last year takes this sparingly for the knee pain.  Physical exam bilateral knees: Good range of motion of both knees.  Tenderness medial joint line of both knees.  No effusion abnormal warmth erythema.    Radiographs: AP and lateral views of both knees shows end-stage arthritis with bone-on-bone medial compartment and severe patellofemoral arthritic changes.  No acute fractures no bony other maladies otherwise   Procedures: Visit Diagnoses: Chronic pain of left knee - Plan: XR Knee 1-2 Views Left  Chronic pain of right knee - Plan: XR Knee 1-2 Views Right  Primary osteoarthritis of both knees  Large Joint Inj: bilateral knee on 02/23/2018 11:34 AM Indications: pain Details: 22 G 1.5 in needle, anterolateral approach  Arthrogram: No  Medications (Right): 0.5 mL lidocaine 1 %; 40 mg methylPREDNISolone acetate 40 MG/ML Medications (Left): 0.5 mL lidocaine 1 %; 40 mg methylPREDNISolone acetate 40 MG/ML Outcome: tolerated well, no immediate complications Procedure, treatment alternatives, risks and benefits explained, specific risks discussed. Consent was given by the patient. Immediately prior to procedure a time out was called to verify the correct patient, procedure, equipment, support staff and site/side marked as required. Patient was prepped and draped in the usual sterile fashion.     Plan: She will follow-up on an as-needed basis.  She was given prescription for Norco 01/24/2024 #30 with no refills she is to use these  sparingly.

## 2018-02-25 ENCOUNTER — Telehealth (INDEPENDENT_AMBULATORY_CARE_PROVIDER_SITE_OTHER): Payer: Self-pay | Admitting: Orthopaedic Surgery

## 2018-02-25 NOTE — Telephone Encounter (Signed)
[facility] called wanting to know the frequency on the hydrocodone RX. CB # [phone]

## 2018-02-26 ENCOUNTER — Telehealth (INDEPENDENT_AMBULATORY_CARE_PROVIDER_SITE_OTHER): Payer: Self-pay | Admitting: Orthopaedic Surgery

## 2018-02-26 NOTE — Telephone Encounter (Signed)
Amy with [facility] pharmacy called left voicemail message advised she received 2 different set of directions on the Rx (Norco)  1 tab every 6 hours as needed for moderate pain and directions stating  Take 1 to 2 tabs every 4 to 6 hours for pain.  Amy asked which set of directions you want on the label. The number to contact Amy is [phone]

## 2018-02-26 NOTE — Telephone Encounter (Signed)
Called nad left VM for the pharmacist that the directions should be one every 6 hours

## 2018-03-05 ENCOUNTER — Telehealth: Payer: Self-pay | Admitting: *Deleted

## 2018-03-05 NOTE — Telephone Encounter (Signed)
Clinic Nurse from [facility] called and left message on Clinical Intake and requested to speak with [facility]. Stated that patient is complaining of Right Foot Pain and thinking it is gout and requesting bloodwork to be done. The pain level is 10/10 walking and 3/10 at rest.   [facility] Nurse back and she stated that she has scheduled an appointment with Dr. Eulas Post tomorrow to be evaluated. Nurse spoke with Dr. Mariea Clonts.

## 2018-03-06 ENCOUNTER — Ambulatory Visit (INDEPENDENT_AMBULATORY_CARE_PROVIDER_SITE_OTHER): Payer: Medicare Other | Admitting: Internal Medicine

## 2018-03-06 ENCOUNTER — Other Ambulatory Visit: Payer: Self-pay | Admitting: Internal Medicine

## 2018-03-06 ENCOUNTER — Encounter: Payer: Self-pay | Admitting: Internal Medicine

## 2018-03-06 VITALS — BP 144/76 | HR 64 | Temp 98.0°F | Ht 67.0 in | Wt 189.2 lb

## 2018-03-06 DIAGNOSIS — M109 Gout, unspecified: Secondary | ICD-10-CM

## 2018-03-06 LAB — URIC ACID: URIC ACID, SERUM: 7 mg/dL (ref 2.5–7.0)

## 2018-03-06 MED ORDER — INDOMETHACIN 50 MG PO CAPS: 50.0000 mg | ORAL_CAPSULE | Freq: Three times a day (TID) | ORAL | 0 refills | Status: DC

## 2018-03-06 MED ORDER — COLCHICINE 0.6 MG PO TABS: 0.6000 mg | ORAL_TABLET | Freq: Every day | ORAL | 0 refills | Status: DC

## 2018-03-06 NOTE — Patient Instructions (Addendum)
START COLCHICINE 0.6MG  2 TIMES DAILY X 3 DAYS   INDOMETHACIN 50MG  3 TIMES DAILY X 5 DAYS - TAKE WITH FOOD  AVOIDS FOODS/DRINKS HIGH IN PURINES  Continue other medications as ordered  Will call with lab result  Follow up with Dr Mariea Clonts as scheduled or sooner if need be  Low-Purine Diet Purines are compounds that affect the level of uric acid in your body. A low-purine diet is a diet that is low in purines. Eating a low-purine diet can prevent the level of uric acid in your body from getting too high and causing gout or kidney stones or both. What do I need to know about this diet?  Choose low-purine foods. Examples of low-purine foods are listed in the next section.  Drink plenty of fluids, especially water. Fluids can help remove uric acid from your body. Try to drink 8-16 cups (1.9-3.8 L) a day.  Limit foods high in fat, especially saturated fat, as fat makes it harder for the body to get rid of uric acid. Foods high in saturated fat include pizza, cheese, ice cream, whole milk, fried foods, and gravies. Choose foods that are lower in fat and lean sources of protein. Use olive oil when cooking as it contains healthy fats that are not high in saturated fat.  Limit alcohol. Alcohol interferes with the elimination of uric acid from your body. If you are having a gout attack, avoid all alcohol.  Keep in mind that different people's bodies react differently to different foods. You will probably learn over time which foods do or do not affect you. If you discover that a food tends to cause your gout to flare up, avoid eating that food. You can more freely enjoy foods that do not cause problems. If you have any questions about a food item, talk to your dietitian or health care provider. Which foods are low, moderate, and high in purines? The following is a list of foods that are low, moderate, and high in purines. You can eat any amount of the foods that are low in purines. You may be able to have  small amounts of foods that are moderate in purines. Ask your health care provider how much of a food moderate in purines you can have. Avoid foods high in purines. Grains  Foods low in purines: Enriched white bread, pasta, rice, cake, cornbread, popcorn.  Foods moderate in purines: Whole-grain breads and cereals, wheat germ, bran, oatmeal. Uncooked oatmeal. Dry wheat bran or wheat germ.  Foods high in purines: Pancakes, [place] toast, biscuits, muffins. Vegetables  Foods low in purines: All vegetables, except those that are moderate in purines.  Foods moderate in purines: Asparagus, cauliflower, spinach, mushrooms, green peas. Fruits  All fruits are low in purines. Meats and other Protein Foods  Foods low in purines: Eggs, nuts, peanut butter.  Foods moderate in purines: 80-90% lean beef, lamb, veal, pork, poultry, fish, eggs, peanut butter, nuts. Crab, lobster, oysters, and shrimp. Cooked dried beans, peas, and lentils.  Foods high in purines: Anchovies, sardines, herring, mussels, tuna, codfish, scallops, trout, and haddock. [name]. Organ meats (such as liver or kidney). Tripe. Game meat. Goose. Sweetbreads. Dairy  All dairy foods are low in purines. Low-fat and fat-free dairy products are best because they are low in saturated fat. Beverages  Drinks low in purines: Water, carbonated beverages, tea, coffee, cocoa.  Drinks moderate in purines: Soft drinks and other drinks sweetened with high-fructose corn syrup. Juices. To find whether a food or  drink is sweetened with high-fructose corn syrup, look at the ingredients list.  Drinks high in purines: Alcoholic beverages (such as beer). Condiments  Foods low in purines: Salt, herbs, olives, pickles, relishes, vinegar.  Foods moderate in purines: Butter, margarine, oils, mayonnaise. Fats and Oils  Foods low in purines: All types, except gravies and sauces made with meat.  Foods high in purines: Gravies and sauces made with  meat. Other Foods  Foods low in purines: Sugars, sweets, gelatin. Cake. Soups made without meat.  Foods moderate in purines: Meat-based or fish-based soups, broths, or bouillons. Foods and drinks sweetened with high-fructose corn syrup.  Foods high in purines: High-fat desserts (such as ice cream, cookies, cakes, pies, doughnuts, and chocolate). Contact your dietitian for more information on foods that are not listed here. This information is not intended to replace advice given to you by your health care provider. Make sure you discuss any questions you have with your health care provider. Document Released: 01/04/2011 Document Revised: 02/15/2016 Document Reviewed: 08/16/2013 Elsevier Interactive Patient Education  2017 [place].  Gout Gout is painful swelling that can happen in some of your joints. Gout is a type of arthritis. This condition is caused by having too much uric acid in your body. Uric acid is a chemical that is made when your body breaks down substances called purines. If your body has too much uric acid, sharp crystals can form and build up in your joints. This causes pain and swelling. Gout attacks can happen quickly and be very painful (acute gout). Over time, the attacks can affect more joints and happen more often (chronic gout). Follow these instructions at home: During a Gout Attack  If directed, put ice on the painful area: ? Put ice in a plastic bag. ? Place a towel between your skin and the bag. ? Leave the ice on for 20 minutes, 2-3 times a day.  Rest the joint as much as possible. If the joint is in your leg, you may be given crutches to use.  Raise (elevate) the painful joint above the level of your heart as often as you can.  Drink enough fluids to keep your pee (urine) clear or pale yellow.  Take over-the-counter and prescription medicines only as told by your doctor.  Do not drive or use heavy machinery while taking prescription pain  medicine.  Follow instructions from your doctor about what you can or cannot eat and drink.  Return to your normal activities as told by your doctor. Ask your doctor what activities are safe for you. Avoiding Future Gout Attacks  Follow a low-purine diet as told by a specialist (dietitian) or your doctor. Avoid foods and drinks that have a lot of purines, such as: ? Liver. ? Kidney. ? Anchovies. ? Asparagus. ? Herring. ? Mushrooms ? Mussels. ? Beer.  Limit alcohol intake to no more than 1 drink a day for nonpregnant women and 2 drinks a day for men. One drink equals 12 oz of beer, 5 oz of wine, or 1 oz of hard liquor.  Stay at a healthy weight or lose weight if you are overweight. If you want to lose weight, talk with your doctor. It is important that you do not lose weight too fast.  Start or continue an exercise plan as told by your doctor.  Drink enough fluids to keep your pee clear or pale yellow.  Take over-the-counter and prescription medicines only as told by your doctor.  Keep all follow-up visits  as told by your doctor. This is important. Contact a doctor if:  You have another gout attack.  You still have symptoms of a gout attack after10 days of treatment.  You have problems (side effects) because of your medicines.  You have chills or a fever.  You have burning pain when you pee (urinate).  You have pain in your lower back or belly. Get help right away if:  You have very bad pain.  Your pain cannot be controlled.  You cannot pee. This information is not intended to replace advice given to you by your health care provider. Make sure you discuss any questions you have with your health care provider. Document Released: 06/18/2008 Document Revised: 02/15/2016 Document Reviewed: 06/22/2015 Elsevier Interactive Patient Education  [employer].

## 2018-03-06 NOTE — Progress Notes (Signed)
Patient ID: KENDI DEFALCO, female   DOB: [birth year], 84 y.o.   MRN: [id number]   [facility] OFFICE  Provider: DR Arletha Grippe  Code Status:  Goals of Care:  Advanced Directives 12/10/2017  Does Patient Have a Medical Advance Directive? Yes  Type of Paramedic of [place];Living will;Out of facility DNR (pink MOST or yellow form)  Does patient want to make changes to medical advance directive? No - Patient declined  Copy of [place] in Chart? Yes  Pre-existing out of facility DNR order (yellow form or pink MOST form) Yellow form placed in chart (order not valid for inpatient use)     Chief Complaint  Patient presents with  . Acute Visit    Right big toe very painful, inflammed and discolored. Started 3 nights ago. Pain is only tender to the touch and unable to walk on without pain. She believes it to be gout since it runs in her family. Patient is fasting.     HPI: Patient is a 84 y.o. female seen today for an acute visit for swollen painful right great toe x 3 days. She cannot tolerate any pressure on the toe. Tylenol ineffective for the pain. No hx gout. She does drink a glass of scotch each evening. She also consumes seafood and pork during the week at various times. No other trigger foods.   Past Medical History:  Diagnosis Date  . A-fib ([place])   . Abdominal pain, unspecified site   . Abnormality of gait 06/02/2013  . Arthritis   . Cataracts, bilateral   . Depression   . Diverticulosis of colon (without mention of hemorrhage)   . Diverticulosis of colon (without mention of hemorrhage) 10/27/2012  . Edema 10/27/2012  . GI bleed 06/2002  . Hemorrhage of gastrointestinal tract, unspecified   . History of stomach ulcers   . Hypertension   . Insomnia, unspecified 10/27/2012  . Localized osteoarthrosis not specified whether primary or secondary, lower leg 05/31/2011  . Migraines   . Nausea alone 10/28/2012  . Obesity, unspecified 10/27/2012  .  Osteoarthrosis, unspecified whether generalized or localized, lower leg   . Other acariasis    peripherial neuropathy  . Other and unspecified alcohol dependence, continuous drinking behavior 10/28/2012  . Palpitations   . PVC (premature ventricular contraction)   . Unspecified hereditary and idiopathic peripheral neuropathy   . UTI (lower urinary tract infection)    pt state uti w/ no pain    Past Surgical History:  Procedure Laterality Date  . ARTHROSCOPIC REPAIR ACL    . BREAST BIOPSY  1973   left  . BREAST REDUCTION SURGERY  12/23/2003  . cataracts    . CHOLECYSTECTOMY  2005  . TONSILLECTOMY AND ADENOIDECTOMY  1941     reports that she quit smoking about 34 years ago. Her smoking use included cigarettes. She has a 30.00 pack-year smoking history. She has never used smokeless tobacco. She reports that she drinks about 0.6 - 1.2 oz of alcohol per week. She reports that she does not use drugs. Social History   Socioeconomic History  . Marital status: Widowed    Spouse name: Not on file  . Number of children: Not on file  . Years of education: Not on file  . Highest education level: Not on file  Occupational History  . Occupation: retired Marine scientist  Social Needs  . Financial resource strain: Not on file  . Food insecurity:    Worry: Not on  file    Inability: Not on file  . Transportation needs:    Medical: Not on file    Non-medical: Not on file  Tobacco Use  . Smoking status: Former Smoker    Packs/day: 1.00    Years: 30.00    Pack years: 30.00    Types: Cigarettes    Last attempt to quit: 10/09/1983    Years since quitting: 34.4  . Smokeless tobacco: Never Used  Substance and Sexual Activity  . Alcohol use: Yes    Alcohol/week: 0.6 - 1.2 oz    Types: 1 - 2 Shots of liquor per week    Comment: 2 DRINKS A DAY - daily  scotch   . Drug use: No  . Sexual activity: Never  Lifestyle  . Physical activity:    Days per week: Not on file    Minutes per session: Not on file    . Stress: Not on file  Relationships  . Social connections:    Talks on phone: Not on file    Gets together: Not on file    Attends religious service: Not on file    Active member of club or organization: Not on file    Attends meetings of clubs or organizations: Not on file    Relationship status: Not on file  . Intimate partner violence:    Fear of current or ex partner: Not on file    Emotionally abused: Not on file    Physically abused: Not on file    Forced sexual activity: Not on file  Other Topics Concern  . Not on file  Social History Narrative   Lives at [facility], in [place]   Widow -2004   Stopped smoking [place], DNR, Living Will   [place] with walker   Alcohol scotch daily    Exercise none    Family History  Problem Relation Age of Onset  . Breast cancer Mother   . Hypertension Mother   . Hypertension Father     Allergies  Allergen Reactions  . Mirtazapine     tremor  . Nickel Other (See Comments)    inflammation  . Other Itching    Acrylic nails  . Prednisone     Causes sleep disturbances     Outpatient Encounter Medications as of 03/06/2018  Medication Sig  . acetaminophen (TYLENOL) 325 MG tablet Take 650 mg by mouth 2 (two) times daily. For pain   . chlorthalidone (HYGROTON) 25 MG tablet Take 25 mg by mouth daily as needed. (SBP >180 mmHg)   . ELIQUIS 5 MG TABS tablet TAKE 1 TABLET BY MOUTH TWICE DAILY  . famotidine (PEPCID) 20 MG tablet Take 1 tablet (20 mg total) by mouth at bedtime.  . flecainide (TAMBOCOR) 50 MG tablet TAKE 1 TABLET BY MOUTH TWICE DAILY FOR ATRIAL FIBRILLATION  . furosemide (LASIX) 20 MG tablet Take 1 tablet (20 mg total) by mouth as needed.  . irbesartan (AVAPRO) 75 MG tablet Take 1 tablet (75 mg total) by mouth daily.  . nebivolol (BYSTOLIC) 10 MG tablet Take 1 tablet (10 mg total) daily by mouth.  . ondansetron (ZOFRAN) 4 MG tablet Take 1 tablet (4 mg total) by mouth 2 (two) times daily as needed for nausea.  Marland Kitchen  Phenylephrine HCl (NEO-SYNEPHRINE NA) Place 1 spray into the nose 2 (two) times daily as needed (congestion).  . sennosides-docusate sodium (SENOKOT-S) 8.6-50 MG tablet Take 1 tablet by mouth as needed.   . sodium chloride (  OCEAN) 0.65 % SOLN nasal spray Place 1 spray into both nostrils daily as needed for congestion.  Marland Kitchen zolpidem (AMBIEN) 5 MG tablet TAKE 1 TABLET BY MOUTH EVERY NIGHT AT BEDTIME AS NEEDED FOR SLEEP  . HYDROcodone-acetaminophen (NORCO) 5-325 MG tablet Take 1 tablet by mouth every 6 (six) hours as needed for moderate pain. One to two tabs every 4-6 hours for pain (Patient not taking: Reported on 03/06/2018)   No facility-administered encounter medications on file as of 03/06/2018.     Review of Systems:  Review of Systems  Musculoskeletal: Positive for arthralgias, gait problem and joint swelling.  Skin: Positive for color change.  All other systems reviewed and are negative.   Health Maintenance  Topic Date Due  . DEXA SCAN  05/08/2000  . INFLUENZA VACCINE  04/23/2018  . TETANUS/TDAP  09/23/2020  . PNA vac Low Risk Adult  Completed    Physical Exam: Vitals:   03/06/18 1059  BP: (!) 144/76  Pulse: 64  Temp: 98 F (36.7 C)  SpO2: 95%  Weight: 189 lb 3.2 oz (85.8 kg)  Height: 5\' 7"  (1.702 m)   Body mass index is 29.63 kg/m. Physical Exam  Constitutional: She is oriented to person, place, and time. She appears well-developed and well-nourished.  Cardiovascular: Intact distal pulses.  Trace LE edema b/l.   Musculoskeletal: She exhibits edema, tenderness and deformity (hemmertoes right).  Right 1st toe with swelling and TTP at 1st MTP joint; blue-purple appearance with cap refill intact  Neurological: She is alert and oriented to person, place, and time.  Skin: Skin is warm and dry.  Psychiatric: She has a normal mood and affect. Her behavior is normal. Judgment and thought content normal.    Labs reviewed: Basic Metabolic Panel: Recent Labs     03/12/17 1050 04/22/17 0212 04/24/17 0500  NA 134 133* 139  K 4.0 3.6 3.2*  CL 90*  --   --   CO2 24  --   --   GLUCOSE 118*  --   --   BUN 14 15 7   CREATININE 0.88 0.8 0.7  CALCIUM 9.5  --   --    Liver Function Tests: Recent Labs    04/22/17 0212  AST 20  ALT 13  ALKPHOS 86   No results for input(s): LIPASE, AMYLASE in the last 8760 hours. No results for input(s): AMMONIA in the last 8760 hours. CBC: Recent Labs    04/22/17 0212 05/23/17 1131 09/08/17 1631  WBC 7.8 6.6 7.3  NEUTROABS  --  4.4 4,387  HGB 15.9 15.1* 15.3  HCT 46 45.0 43.9  MCV  --  101.0* 94.2  PLT 226 234.0 243   Lipid Panel: No results for input(s): CHOL, HDL, LDLCALC, TRIG, CHOLHDL, LDLDIRECT in the last 8760 hours. Lab Results  Component Value Date   HGBA1C 5.3 10/03/2015    Procedures since last visit: Xr Knee 1-2 Views Left  Result Date: 02/23/2018 Radiographs: AP and lateral views of both knees shows end-stage arthritis with bone-on-bone medial compartment and severe patellofemoral arthritic changes.  No acute fractures no bony other maladies otherwise   Xr Knee 1-2 Views Right  Result Date: 02/23/2018 Radiographs: AP and lateral views of both knees shows end-stage arthritis with bone-on-bone medial compartment and severe patellofemoral arthritic changes.  No acute fractures no bony other maladies otherwise    Assessment/Plan   ICD-10-CM   1. Acute gout involving toe of right foot, unspecified cause M10.9 Uric Acid  colchicine 0.6 MG tablet    indomethacin (INDOCIN) 50 MG capsule   Reduce Etoh use  START COLCHICINE 0.6MG  2 TIMES DAILY X 3 DAYS   INDOMETHACIN 50MG  3 TIMES DAILY X 5 DAYS - TAKE WITH FOOD  AVOIDS FOODS/DRINKS HIGH IN PURINES education handout given  Continue other medications as ordered  Will call with lab result  Follow up with Dr Mariea Clonts as scheduled or sooner if need be  [employer]. Perlie Gold  [facility] [address] [place], [place] [address] [phone] Cell (Monday-Friday 8 AM - 5 PM) [phone] After 5 PM and follow prompts

## 2018-03-10 ENCOUNTER — Non-Acute Institutional Stay: Payer: Medicare Other

## 2018-03-10 VITALS — BP 138/84 | HR 90 | Temp 97.9°F | Ht 67.0 in | Wt 189.0 lb

## 2018-03-10 DIAGNOSIS — E2839 Other primary ovarian failure: Secondary | ICD-10-CM

## 2018-03-10 DIAGNOSIS — Z0000 Encounter for general adult medical examination without abnormal findings: Secondary | ICD-10-CM | POA: Diagnosis not present

## 2018-03-10 NOTE — Patient Instructions (Signed)
[name] , Thank you for taking time to come for your Medicare Wellness Visit. I appreciate your ongoing commitment to your health goals. Please review the following plan we discussed and let me know if I can assist you in the future.   Screening recommendations/referrals: Colonoscopy excluded, over age 82 Mammogram excluded, over age 59 Bone Density due, ordered Recommended yearly ophthalmology/optometry visit for glaucoma screening and checkup Recommended yearly dental visit for hygiene and checkup  Vaccinations: Influenza vaccine up to date, due 2019 fall season Pneumococcal vaccine up to date, completed Tdap vaccine up to date, due 09/23/2020 Shingles vaccine due    Advanced directives: in chart  Conditions/risks identified: none  Next appointment: Dr. Mariea Clonts 04/15/2018 @ 11am   Preventive Care 7 Years and Older, Female Preventive care refers to lifestyle choices and visits with your health care provider that can promote health and wellness. What does preventive care include?  A yearly physical exam. This is also called an annual well check.  Dental exams once or twice a year.  Routine eye exams. Ask your health care provider how often you should have your eyes checked.  Personal lifestyle choices, including:  Daily care of your teeth and gums.  Regular physical activity.  Eating a healthy diet.  Avoiding tobacco and drug use.  Limiting alcohol use.  Practicing safe sex.  Taking low-dose aspirin every day.  Taking vitamin and mineral supplements as recommended by your health care provider. What happens during an annual well check? The services and screenings done by your health care provider during your annual well check will depend on your age, overall health, lifestyle risk factors, and family history of disease. Counseling  Your health care provider may ask you questions about your:  Alcohol use.  Tobacco use.  Drug use.  Emotional well-being.  Home  and relationship well-being.  Sexual activity.  Eating habits.  History of falls.  Memory and ability to understand (cognition).  Work and work Statistician.  Reproductive health. Screening  You may have the following tests or measurements:  Height, weight, and BMI.  Blood pressure.  Lipid and cholesterol levels. These may be checked every 5 years, or more frequently if you are over 5 years old.  Skin check.  Lung cancer screening. You may have this screening every year starting at age 65 if you have a 30-pack-year history of smoking and currently smoke or have quit within the past 15 years.  Fecal occult blood test (FOBT) of the stool. You may have this test every year starting at age 17.  Flexible sigmoidoscopy or colonoscopy. You may have a sigmoidoscopy every 5 years or a colonoscopy every 10 years starting at age 70.  Hepatitis C blood test.  Hepatitis B blood test.  Sexually transmitted disease (STD) testing.  Diabetes screening. This is done by checking your blood sugar (glucose) after you have not eaten for a while (fasting). You may have this done every 1-3 years.  Bone density scan. This is done to screen for osteoporosis. You may have this done starting at age 20.  Mammogram. This may be done every 1-2 years. Talk to your health care provider about how often you should have regular mammograms. Talk with your health care provider about your test results, treatment options, and if necessary, the need for more tests. Vaccines  Your health care provider may recommend certain vaccines, such as:  Influenza vaccine. This is recommended every year.  Tetanus, diphtheria, and acellular pertussis (Tdap, Td) vaccine. You  may need a Td booster every 10 years.  Zoster vaccine. You may need this after age 6.  Pneumococcal 13-valent conjugate (PCV13) vaccine. One dose is recommended after age 86.  Pneumococcal polysaccharide (PPSV23) vaccine. One dose is recommended  after age 97. Talk to your health care provider about which screenings and vaccines you need and how often you need them. This information is not intended to replace advice given to you by your health care provider. Make sure you discuss any questions you have with your health care provider. Document Released: 10/06/2015 Document Revised: 05/29/2016 Document Reviewed: 07/11/2015 Elsevier Interactive Patient Education  2017 [employer] Prevention in the Home Falls can cause injuries. They can happen to people of all ages. There are many things you can do to make your home safe and to help prevent falls. What can I do on the outside of my home?  Regularly fix the edges of walkways and driveways and fix any cracks.  Remove anything that might make you trip as you walk through a door, such as a raised step or threshold.  Trim any bushes or trees on the path to your home.  Use bright outdoor lighting.  Clear any walking paths of anything that might make someone trip, such as rocks or tools.  Regularly check to see if handrails are loose or broken. Make sure that both sides of any steps have handrails.  Any raised decks and porches should have guardrails on the edges.  Have any leaves, snow, or ice cleared regularly.  Use sand or salt on walking paths during winter.  Clean up any spills in your garage right away. This includes oil or grease spills. What can I do in the bathroom?  Use night lights.  Install grab bars by the toilet and in the tub and shower. Do not use towel bars as grab bars.  Use non-skid mats or decals in the tub or shower.  If you need to sit down in the shower, use a plastic, non-slip stool.  Keep the floor dry. Clean up any water that spills on the floor as soon as it happens.  Remove soap buildup in the tub or shower regularly.  Attach bath mats securely with double-sided non-slip rug tape.  Do not have throw rugs and other things on the floor  that can make you trip. What can I do in the bedroom?  Use night lights.  Make sure that you have a light by your bed that is easy to reach.  Do not use any sheets or blankets that are too big for your bed. They should not hang down onto the floor.  Have a firm chair that has side arms. You can use this for support while you get dressed.  Do not have throw rugs and other things on the floor that can make you trip. What can I do in the kitchen?  Clean up any spills right away.  Avoid walking on wet floors.  Keep items that you use a lot in easy-to-reach places.  If you need to reach something above you, use a strong step stool that has a grab bar.  Keep electrical cords out of the way.  Do not use floor polish or wax that makes floors slippery. If you must use wax, use non-skid floor wax.  Do not have throw rugs and other things on the floor that can make you trip. What can I do with my stairs?  Do not leave any items  on the stairs.  Make sure that there are handrails on both sides of the stairs and use them. Fix handrails that are broken or loose. Make sure that handrails are as long as the stairways.  Check any carpeting to make sure that it is firmly attached to the stairs. Fix any carpet that is loose or worn.  Avoid having throw rugs at the top or bottom of the stairs. If you do have throw rugs, attach them to the floor with carpet tape.  Make sure that you have a light switch at the top of the stairs and the bottom of the stairs. If you do not have them, ask someone to add them for you. What else can I do to help prevent falls?  Wear shoes that:  Do not have high heels.  Have rubber bottoms.  Are comfortable and fit you well.  Are closed at the toe. Do not wear sandals.  If you use a stepladder:  Make sure that it is fully opened. Do not climb a closed stepladder.  Make sure that both sides of the stepladder are locked into place.  Ask someone to hold it  for you, if possible.  Clearly mark and make sure that you can see:  Any grab bars or handrails.  First and last steps.  Where the edge of each step is.  Use tools that help you move around (mobility aids) if they are needed. These include:  Canes.  Walkers.  Scooters.  Crutches.  Turn on the lights when you go into a dark area. Replace any light bulbs as soon as they burn out.  Set up your furniture so you have a clear path. Avoid moving your furniture around.  If any of your floors are uneven, fix them.  If there are any pets around you, be aware of where they are.  Review your medicines with your doctor. Some medicines can make you feel dizzy. This can increase your chance of falling. Ask your doctor what other things that you can do to help prevent falls. This information is not intended to replace advice given to you by your health care provider. Make sure you discuss any questions you have with your health care provider. Document Released: 07/06/2009 Document Revised: 02/15/2016 Document Reviewed: 10/14/2014 Elsevier Interactive Patient Education  2017 [employer].

## 2018-03-10 NOTE — Progress Notes (Signed)
Subjective:   [name] is a 84 y.o. female who presents for Medicare Annual (Subsequent) preventive examination at [facility]  Last AWV-01/29/2017    Objective:     Vitals: BP 138/84 (BP Location: Right Arm, Patient Position: Sitting)   Pulse 90   Temp 97.9 F (36.6 C) (Oral)   Ht 5\' 7"  (1.702 m)   Wt 189 lb (85.7 kg)   LMP  (LMP Unknown)   SpO2 97%   BMI 29.60 kg/m   Body mass index is 29.6 kg/m.  Advanced Directives 03/10/2018 12/10/2017 08/06/2017 04/29/2017 04/09/2017 12/18/2016 09/25/2016  Does Patient Have a Medical Advance Directive? Yes Yes Yes Yes Yes Yes Yes  Type of Paramedic of [place];Out of facility DNR (pink MOST or yellow form) [place];Living will;Out of facility DNR (pink MOST or yellow form) Out of facility DNR (pink MOST or yellow form);Healthcare Power of [employer] of facility DNR (pink MOST or yellow form);Healthcare Power of Attorney Living will;Healthcare Power of [place];Living will;Out of facility DNR (pink MOST or yellow form) [place];Living will;Out of facility DNR (pink MOST or yellow form)  Does patient want to make changes to medical advance directive? No - Patient declined No - Patient declined No - Patient declined - - - -  Copy of [place] in Chart? Yes Yes Yes Yes - Yes Yes  Pre-existing out of facility DNR order (yellow form or pink MOST form) Yellow form placed in chart (order not valid for inpatient use) Yellow form placed in chart (order not valid for inpatient use) Yellow form placed in chart (order not valid for inpatient use) Yellow form placed in chart (order not valid for inpatient use) - Yellow form placed in chart (order not valid for inpatient use) Yellow form placed in chart (order not valid for inpatient use)    Tobacco Social History   Tobacco Use  Smoking Status Former Smoker  . Packs/day:  1.00  . Years: 30.00  . Pack years: 30.00  . Types: Cigarettes  . Last attempt to quit: 10/09/1983  . Years since quitting: 34.4  Smokeless Tobacco Never Used     Counseling given: Not Answered   Clinical Intake:  Pre-visit preparation completed: No  Pain : No/denies pain     Nutritional Risks: None Diabetes: No  How often do you need to have someone help you when you read instructions, pamphlets, or other written materials from your doctor or pharmacy?: 1 - Never What is the last grade level you completed in school?: BSN  Interpreter Needed?: No  Information entered by :: Tyson Dense, RN  Past Medical History:  Diagnosis Date  . A-fib ([place])   . Abdominal pain, unspecified site   . Abnormality of gait 06/02/2013  . Arthritis   . Cataracts, bilateral   . Depression   . Diverticulosis of colon (without mention of hemorrhage)   . Diverticulosis of colon (without mention of hemorrhage) 10/27/2012  . Edema 10/27/2012  . GI bleed 06/2002  . Hemorrhage of gastrointestinal tract, unspecified   . History of stomach ulcers   . Hypertension   . Insomnia, unspecified 10/27/2012  . Localized osteoarthrosis not specified whether primary or secondary, lower leg 05/31/2011  . Migraines   . Nausea alone 10/28/2012  . Obesity, unspecified 10/27/2012  . Osteoarthrosis, unspecified whether generalized or localized, lower leg   . Other acariasis    peripherial neuropathy  .  Other and unspecified alcohol dependence, continuous drinking behavior 10/28/2012  . Palpitations   . PVC (premature ventricular contraction)   . Unspecified hereditary and idiopathic peripheral neuropathy   . UTI (lower urinary tract infection)    pt state uti w/ no pain   Past Surgical History:  Procedure Laterality Date  . ARTHROSCOPIC REPAIR ACL    . BREAST BIOPSY  1973   left  . BREAST REDUCTION SURGERY  12/23/2003  . cataracts    . CHOLECYSTECTOMY  2005  . TONSILLECTOMY AND ADENOIDECTOMY  1941   Family  History  Problem Relation Age of Onset  . Breast cancer Mother   . Hypertension Mother   . Hypertension Father    Social History   Socioeconomic History  . Marital status: Widowed    Spouse name: Not on file  . Number of children: Not on file  . Years of education: Not on file  . Highest education level: Not on file  Occupational History  . Occupation: retired Marine scientist  Social Needs  . Financial resource strain: Not hard at all  . Food insecurity:    Worry: Never true    Inability: Never true  . Transportation needs:    Medical: No    Non-medical: No  Tobacco Use  . Smoking status: Former Smoker    Packs/day: 1.00    Years: 30.00    Pack years: 30.00    Types: Cigarettes    Last attempt to quit: 10/09/1983    Years since quitting: 34.4  . Smokeless tobacco: Never Used  Substance and Sexual Activity  . Alcohol use: Yes    Alcohol/week: 0.6 - 1.2 oz    Types: 1 - 2 Shots of liquor per week    Comment: 2 DRINKS A DAY - daily  scotch   . Drug use: No  . Sexual activity: Never  Lifestyle  . Physical activity:    Days per week: 0 days    Minutes per session: 0 min  . Stress: Only a little  Relationships  . Social connections:    Talks on phone: Three times a week    Gets together: Three times a week    Attends religious service: Never    Active member of club or organization: No    Attends meetings of clubs or organizations: Never    Relationship status: Widowed  Other Topics Concern  . Not on file  Social History Narrative   Lives at [facility], in [place]   Widow -2004   Stopped smoking 1985   POA, DNR, Living Will   [place] with walker   Alcohol scotch daily    Exercise none    Outpatient Encounter Medications as of 03/10/2018  Medication Sig  . acetaminophen (TYLENOL) 325 MG tablet Take 650 mg by mouth 2 (two) times daily. For pain   . chlorthalidone (HYGROTON) 25 MG tablet Take 25 mg by mouth daily as needed. (SBP >180 mmHg)   . colchicine 0.6 MG tablet Take  1 tablet (0.6 mg total) by mouth daily.  Marland Kitchen ELIQUIS 5 MG TABS tablet TAKE 1 TABLET BY MOUTH TWICE DAILY  . famotidine (PEPCID) 20 MG tablet Take 1 tablet (20 mg total) by mouth at bedtime.  . flecainide (TAMBOCOR) 50 MG tablet TAKE 1 TABLET BY MOUTH TWICE DAILY FOR ATRIAL FIBRILLATION  . furosemide (LASIX) 20 MG tablet Take 1 tablet (20 mg total) by mouth as needed.  Marland Kitchen HYDROcodone-acetaminophen (NORCO) 5-325 MG tablet Take 1 tablet by mouth every  6 (six) hours as needed for moderate pain. One to two tabs every 4-6 hours for pain  . indomethacin (INDOCIN) 50 MG capsule Take 1 capsule (50 mg total) by mouth 3 (three) times daily with meals.  . irbesartan (AVAPRO) 75 MG tablet Take 1 tablet (75 mg total) by mouth daily.  . nebivolol (BYSTOLIC) 10 MG tablet Take 1 tablet (10 mg total) daily by mouth.  . ondansetron (ZOFRAN) 4 MG tablet Take 1 tablet (4 mg total) by mouth 2 (two) times daily as needed for nausea.  Marland Kitchen Phenylephrine HCl (NEO-SYNEPHRINE NA) Place 1 spray into the nose 2 (two) times daily as needed (congestion).  . sennosides-docusate sodium (SENOKOT-S) 8.6-50 MG tablet Take 1 tablet by mouth as needed.   . sodium chloride (OCEAN) 0.65 % SOLN nasal spray Place 1 spray into both nostrils daily as needed for congestion.  Marland Kitchen zolpidem (AMBIEN) 5 MG tablet TAKE 1 TABLET BY MOUTH EVERY NIGHT AT BEDTIME AS NEEDED FOR SLEEP   No facility-administered encounter medications on file as of 03/10/2018.     Activities of Daily Living In your present state of health, do you have any difficulty performing the following activities: 03/10/2018  Hearing? Y  Vision? N  Difficulty concentrating or making decisions? N  Walking or climbing stairs? Y  Dressing or bathing? N  Doing errands, shopping? N  Preparing Food and eating ? N  Using the Toilet? N  In the past six months, have you accidently leaked urine? Y  Do you have problems with loss of bowel control? N  Managing your Medications? N  Managing  your Finances? N  Housekeeping or managing your Housekeeping? N  Some recent data might be hidden    Patient Care Team: Gayland Curry, DO as PCP - General (Geriatric Medicine) [place], Peter M, MD as Attending Physician (Cardiology) [facility] Rosezella [place], MD as Consulting Physician (Neurology)    Assessment:   This is a routine wellness examination for [name].  Exercise Activities and Dietary recommendations Current Exercise Habits: The patient does not participate in regular exercise at present, Exercise limited by: orthopedic condition(s)  Goals    None      Fall Risk Fall Risk  03/10/2018 03/06/2018 12/10/2017 08/06/2017 04/09/2017  Falls in the past year? No No No No No  Comment - - - - -  Number falls in past yr: - - - - -  Injury with Fall? - - - - -  Comment - - - - -  Risk for fall due to : - - - - -  Follow up - - - - -   Is the patient's home free of loose throw rugs in walkways, pet beds, electrical cords, etc?   yes      Grab bars in the bathroom? yes      Handrails on the stairs?   yes      Adequate lighting?   yes  Depression Screen PHQ 2/9 Scores 03/10/2018 12/10/2017 08/06/2017 01/29/2017  PHQ - 2 Score 0 0 0 0     Cognitive Function MMSE - Mini Mental State Exam 03/10/2018 01/29/2017 10/04/2015  Orientation to time 5 5 5   Orientation to Place 5 5 5   Registration 3 3 3   Attention/ Calculation 5 5 5   Recall 3 3 3   Language- name 2 objects 2 2 2   Language- repeat 1 1 1   Language- follow 3 step command 3 3 3   Language- read & follow direction 1  1 1  Write a sentence 1 1 1   Copy design 1 1 1   Total score 30 30 30         Immunization History  Administered Date(s) Administered  . Influenza Split 07/23/2013  . Influenza Whole 06/23/2012  . Influenza, High Dose Seasonal PF 07/19/2016, 05/23/2017  . Influenza,inj,Quad PF,6+ Mos 06/23/2015  . Influenza-Unspecified 07/11/2014  . Pneumococcal Conjugate-13 10/04/2015  . Pneumococcal  Polysaccharide-23 09/23/2010  . Td 09/23/2010  . Zoster 09/23/1998    Qualifies for Shingles Vaccine? Yes, educated and declined for now  Screening Tests Health Maintenance  Topic Date Due  . DEXA SCAN  05/08/2000  . INFLUENZA VACCINE  04/23/2018  . TETANUS/TDAP  09/23/2020  . PNA vac Low Risk Adult  Completed    Cancer Screenings: Lung: Low Dose CT Chest recommended if Age 80-80 years, 30 pack-year currently smoking OR have quit w/in 15years. Patient does not qualify. Breast:  Up to date on Mammogram? Yes   Up to date of Bone Density/Dexa? No, ordered Colorectal: up to date  Additional Screenings:  Hepatitis C Screening: declined     Plan:  I have personally reviewed and addressed the Medicare Annual Wellness questionnaire and have noted the following in the patient's chart:  A. Medical and social history B. Use of alcohol, tobacco or illicit drugs  C. Current medications and supplements D. Functional ability and status E.  Nutritional status F.  Physical activity G. Advance directives H. List of other physicians I.  Hospitalizations, surgeries, and ER visits in previous 12 months J.  Independence to include hearing, vision, cognitive, depression L. Referrals and appointments - none  In addition, I have reviewed and discussed with patient certain preventive protocols, quality metrics, and best practice recommendations. A written personalized care plan for preventive services as well as general preventive health recommendations were provided to patient.  See attached scanned questionnaire for additional information.   Signed,   Tyson Dense, RN Nurse Health Advisor  Patient Concerns: None

## 2018-03-12 ENCOUNTER — Telehealth: Payer: Self-pay | Admitting: *Deleted

## 2018-03-12 NOTE — Telephone Encounter (Signed)
Sounds appropriate.  She will need to have her stool checked for blood though it could be a result of the colchicine potentially.  It often causes more diarrhea than changes to the color of the stool as far as I'm aware.

## 2018-03-12 NOTE — Telephone Encounter (Signed)
Patient called and stated that she is having black loose stools, not tarey. Stated it started 2 days ago. Stated that she saw Dr. Eulas Post 03/06/18 for gout and given Colchicine, which she has finished and Indomethacin, which she stopped taking yesterday. Patient has a history of Ulcer and is taking Eliquis. No other complaints.   I offered patient an appointment for today and she refused. Scheduled one for tomorrow with Dr. Eulas Post.   Advised patient if things worsened or any new symptoms to go to the ER. She agreed.

## 2018-03-13 ENCOUNTER — Encounter: Payer: Self-pay | Admitting: Internal Medicine

## 2018-03-13 ENCOUNTER — Ambulatory Visit (INDEPENDENT_AMBULATORY_CARE_PROVIDER_SITE_OTHER): Payer: Medicare Other | Admitting: Internal Medicine

## 2018-03-13 VITALS — BP 188/72 | HR 64 | Temp 98.1°F

## 2018-03-13 DIAGNOSIS — S5010XA Contusion of unspecified forearm, initial encounter: Secondary | ICD-10-CM

## 2018-03-13 DIAGNOSIS — S51851A Open bite of right forearm, initial encounter: Secondary | ICD-10-CM

## 2018-03-13 DIAGNOSIS — W5501XA Bitten by cat, initial encounter: Secondary | ICD-10-CM | POA: Diagnosis not present

## 2018-03-13 DIAGNOSIS — L089 Local infection of the skin and subcutaneous tissue, unspecified: Secondary | ICD-10-CM | POA: Diagnosis not present

## 2018-03-13 DIAGNOSIS — Z7901 Long term (current) use of anticoagulants: Secondary | ICD-10-CM

## 2018-03-13 DIAGNOSIS — R195 Other fecal abnormalities: Secondary | ICD-10-CM

## 2018-03-13 LAB — CBC WITH DIFFERENTIAL/PLATELET
BASOS PCT: 0.3 %
Basophils Absolute: 18 cells/uL (ref 0–200)
EOS PCT: 1.2 %
Eosinophils Absolute: 71 cells/uL (ref 15–500)
HEMATOCRIT: 38.5 % (ref 35.0–45.0)
Hemoglobin: 13.4 g/dL (ref 11.7–15.5)
LYMPHS ABS: 1363 {cells}/uL (ref 850–3900)
MCH: 33.6 pg — ABNORMAL HIGH (ref 27.0–33.0)
MCHC: 34.8 g/dL (ref 32.0–36.0)
MCV: 96.5 fL (ref 80.0–100.0)
MPV: 9.9 fL (ref 7.5–12.5)
Monocytes Relative: 11.1 %
NEUTROS PCT: 64.3 %
Neutro Abs: 3794 cells/uL (ref 1500–7800)
PLATELETS: 261 10*3/uL (ref 140–400)
RBC: 3.99 10*6/uL (ref 3.80–5.10)
RDW: 14.5 % (ref 11.0–15.0)
TOTAL LYMPHOCYTE: 23.1 %
WBC: 5.9 10*3/uL (ref 3.8–10.8)
WBCMIX: 655 {cells}/uL (ref 200–950)

## 2018-03-13 MED ORDER — CEFAZOLIN SODIUM 1 G IJ SOLR: 1.0000 g | Freq: Once | INTRAMUSCULAR | Status: DC

## 2018-03-13 MED ORDER — CEFTRIAXONE SODIUM 1 G IJ SOLR
1.0000 g | Freq: Once | INTRAMUSCULAR | Status: AC
  Administered 2018-03-13: 1 g via INTRAMUSCULAR

## 2018-03-13 NOTE — Progress Notes (Signed)
Patient ID: [name], female   DOB: 08/18/1933, 84 y.o.   MRN: [id number]   [facility] OFFICE  Provider: DR Arletha Grippe  Code Status: Goals of Care:  Advanced Directives 03/14/2018  Does Patient Have a Medical Advance Directive? Yes  Type of Paramedic of [place];Out of facility DNR (pink MOST or yellow form)  Does patient want to make changes to medical advance directive? -  Copy of [place] in Chart? Yes  Pre-existing out of facility DNR order (yellow form or pink MOST form) Yellow form placed in chart (order not valid for inpatient use)     Chief Complaint  Patient presents with  . Acute Visit    Black stools started Wednesday and bleeding into her tissues on arms after a cat attack Monday morning.     HPI: Patient is a 84 y.o. female seen today for an acute visit for black stools. She takes eliquis 2/2 afib and recently completed indocin for acute gout attack. She has a hx GI bleed and diverticulosis. Hgb 15.3 in Dec 2018.  She was attacked by her cat on 03/09/18 when she startled him and had sudden swelling of right wrist. Area became painful last night. No d/c. She has worsening ecchymosis of RUE and also has left forearm contusion. Last Td 09/2010.  Past Medical History:  Diagnosis Date  . A-fib ([place])   . Abdominal pain, unspecified site   . Abnormality of gait 06/02/2013  . Arthritis   . Cataracts, bilateral   . Depression   . Diverticulosis of colon (without mention of hemorrhage)   . Diverticulosis of colon (without mention of hemorrhage) 10/27/2012  . Edema 10/27/2012  . GI bleed 06/2002  . Hemorrhage of gastrointestinal tract, unspecified   . History of stomach ulcers   . Hypertension   . Insomnia, unspecified 10/27/2012  . Localized osteoarthrosis not specified whether primary or secondary, lower leg 05/31/2011  . Migraines   . Nausea alone 10/28/2012  . Obesity, unspecified 10/27/2012  . Osteoarthrosis, unspecified whether  generalized or localized, lower leg   . Other acariasis    peripherial neuropathy  . Other and unspecified alcohol dependence, continuous drinking behavior 10/28/2012  . Palpitations   . PVC (premature ventricular contraction)   . Unspecified hereditary and idiopathic peripheral neuropathy   . UTI (lower urinary tract infection)    pt state uti w/ no pain    Past Surgical History:  Procedure Laterality Date  . ARTHROSCOPIC REPAIR ACL    . BREAST BIOPSY  1973   left  . BREAST REDUCTION SURGERY  12/23/2003  . cataracts    . CHOLECYSTECTOMY  2005  . TONSILLECTOMY AND ADENOIDECTOMY  1941     reports that she quit smoking about 34 years ago. Her smoking use included cigarettes. She has a 30.00 pack-year smoking history. She has never used smokeless tobacco. She reports that she drinks about 0.6 - 1.2 oz of alcohol per week. She reports that she does not use drugs. Social History   Socioeconomic History  . Marital status: Widowed    Spouse name: Not on file  . Number of children: Not on file  . Years of education: Not on file  . Highest education level: Not on file  Occupational History  . Occupation: retired Marine scientist  Social Needs  . Financial resource strain: Not hard at all  . Food insecurity:    Worry: Never true    Inability: Never true  .  Transportation needs:    Medical: No    Non-medical: No  Tobacco Use  . Smoking status: Former Smoker    Packs/day: 1.00    Years: 30.00    Pack years: 30.00    Types: Cigarettes    Last attempt to quit: 10/09/1983    Years since quitting: 34.4  . Smokeless tobacco: Never Used  Substance and Sexual Activity  . Alcohol use: Yes    Alcohol/week: 0.6 - 1.2 oz    Types: 1 - 2 Shots of liquor per week    Comment: 2 DRINKS A DAY - daily  scotch   . Drug use: No  . Sexual activity: Never  Lifestyle  . Physical activity:    Days per week: 0 days    Minutes per session: 0 min  . Stress: Only a little  Relationships  . Social  connections:    Talks on phone: Three times a week    Gets together: Three times a week    Attends religious service: Never    Active member of club or organization: No    Attends meetings of clubs or organizations: Never    Relationship status: Widowed  . Intimate partner violence:    Fear of current or ex partner: No    Emotionally abused: No    Physically abused: No    Forced sexual activity: No  Other Topics Concern  . Not on file  Social History Narrative   Lives at [facility], in [place]   Widow -2004   Stopped smoking [place], DNR, Living Will   [place] with walker   Alcohol scotch daily    Exercise none    Family History  Problem Relation Age of Onset  . Breast cancer Mother   . Hypertension Mother   . Hypertension Father     Allergies  Allergen Reactions  . Mirtazapine     tremor  . [place] Other (See Comments)    inflammation  . Other Itching    Acrylic nails  . Prednisone     Causes sleep disturbances     Outpatient Encounter Medications as of 03/13/2018  Medication Sig  . acetaminophen (TYLENOL) 325 MG tablet Take 325-487.5 mg by mouth See admin instructions. Take 325 mg in the morning and take 487.5 mg at night  . chlorthalidone (HYGROTON) 25 MG tablet Take 25 mg by mouth daily as needed. (SBP >180 mmHg)   . ELIQUIS 5 MG TABS tablet TAKE 1 TABLET BY MOUTH TWICE DAILY  . famotidine (PEPCID) 20 MG tablet Take 1 tablet (20 mg total) by mouth at bedtime.  . flecainide (TAMBOCOR) 50 MG tablet TAKE 1 TABLET BY MOUTH TWICE DAILY FOR ATRIAL FIBRILLATION  . furosemide (LASIX) 20 MG tablet Take 1 tablet (20 mg total) by mouth as needed. (Patient taking differently: Take 20 mg by mouth as needed for fluid or edema. )  . HYDROcodone-acetaminophen (NORCO) 5-325 MG tablet Take 1 tablet by mouth every 6 (six) hours as needed for moderate pain. One to two tabs every 4-6 hours for pain (Patient taking differently: Take 1-2 tablets by mouth See admin instructions. One to  two tabs every 4-6 hours for pain)  . irbesartan (AVAPRO) 75 MG tablet Take 1 tablet (75 mg total) by mouth daily.  . nebivolol (BYSTOLIC) 10 MG tablet Take 1 tablet (10 mg total) daily by mouth.  . ondansetron (ZOFRAN) 4 MG tablet Take 1 tablet (4 mg total) by mouth 2 (two) times daily as  needed for nausea.  Marland Kitchen Phenylephrine HCl (NEO-SYNEPHRINE NA) Place 1 spray into the nose 2 (two) times daily as needed (congestion).  . sennosides-docusate sodium (SENOKOT-S) 8.6-50 MG tablet Take 1 tablet by mouth as needed for constipation.   . sodium chloride (OCEAN) 0.65 % SOLN nasal spray Place 2 sprays into both nostrils daily as needed for congestion.   Marland Kitchen zolpidem (AMBIEN) 5 MG tablet TAKE 1 TABLET BY MOUTH EVERY NIGHT AT BEDTIME AS NEEDED FOR SLEEP  . [DISCONTINUED] colchicine 0.6 MG tablet Take 1 tablet (0.6 mg total) by mouth daily.  . [DISCONTINUED] indomethacin (INDOCIN) 50 MG capsule Take 1 capsule (50 mg total) by mouth 3 (three) times daily with meals. (Patient not taking: Reported on 03/13/2018)  . [EXPIRED] ceFAZolin (ANCEF) injection 1 g   . [EXPIRED] cefTRIAXone (ROCEPHIN) injection 1 g    No facility-administered encounter medications on file as of 03/13/2018.     Review of Systems:  Review of Systems  Gastrointestinal: Positive for blood in stool.  Musculoskeletal: Positive for arthralgias and joint swelling.  Skin: Positive for wound.  Hematological: Bruises/bleeds easily.  All other systems reviewed and are negative.   Health Maintenance  Topic Date Due  . DEXA SCAN  05/08/2000  . INFLUENZA VACCINE  04/23/2018  . TETANUS/TDAP  09/23/2020  . PNA vac Low Risk Adult  Completed    Physical Exam: Vitals:   03/13/18 1127  BP: (!) 188/72  Pulse: 64  Temp: 98.1 F (36.7 C)  SpO2: 94%   There is no height or weight on file to calculate BMI. Physical Exam  Constitutional: She is oriented to person, place, and time. She appears well-developed and well-nourished.    Cardiovascular: Normal rate and intact distal pulses. An irregularly irregular rhythm present. Exam reveals no gallop and no friction rub.  Murmur heard.  Systolic murmur is present with a grade of 1/6. LE edema b/l, no calf TTP  Pulmonary/Chest: Effort normal and breath sounds normal. No stridor. No respiratory distress. She has no wheezes. She has no rales.  Abdominal: Soft. Bowel sounds are normal. She exhibits no distension and no mass. There is no tenderness. There is no rebound and no guarding. No hernia.  obese  Genitourinary: Rectal exam shows guaiac positive stool (grossly).  Genitourinary Comments: Good tone, no palpable rectal mass, no hemorrhoid  Musculoskeletal: She exhibits edema (small and large joints) and tenderness.  Neurological: She is alert and oriented to person, place, and time.  Skin: Skin is warm and dry. Bruising (b/l forearm) noted.     R>L forearm contusion  Psychiatric: She has a normal mood and affect. Her behavior is normal. Judgment and thought content normal.    Labs reviewed: Basic Metabolic Panel: Recent Labs    04/22/17 0212 04/24/17 0500 03/14/18 1143  NA 133* 139 137  K 3.6 3.2* 4.5  CL  --   --  107  GLUCOSE  --   --  121*  BUN 15 7 15   CREATININE 0.8 0.7 0.80   Liver Function Tests: Recent Labs    04/22/17 0212  AST 20  ALT 13  ALKPHOS 86   No results for input(s): LIPASE, AMYLASE in the last 8760 hours. No results for input(s): AMMONIA in the last 8760 hours. CBC: Recent Labs    09/08/17 1631 03/13/18 0123 03/14/18 1136 03/14/18 1143  WBC 7.3 5.9 6.7  --   NEUTROABS 4,387 3,794 4.6  --   HGB 15.3 13.4 13.8 14.3  HCT 43.9 38.5  42.5 42.0  MCV 94.2 96.5 101.7*  --   PLT 243 261 199  --    Lipid Panel: No results for input(s): CHOL, HDL, LDLCALC, TRIG, CHOLHDL, LDLDIRECT in the last 8760 hours. Lab Results  Component Value Date   HGBA1C 5.3 10/03/2015    Procedures since last visit: Xr Knee 1-2 Views Left  Result  Date: 02/23/2018 Radiographs: AP and lateral views of both knees shows end-stage arthritis with bone-on-bone medial compartment and severe patellofemoral arthritic changes.  No acute fractures no bony other maladies otherwise   Xr Knee 1-2 Views Right  Result Date: 02/23/2018 Radiographs: AP and lateral views of both knees shows end-stage arthritis with bone-on-bone medial compartment and severe patellofemoral arthritic changes.  No acute fractures no bony other maladies otherwise    Assessment/Plan   ICD-10-CM   1. Occult blood in stools - likely 2/2 eliquis +/- indocin use R19.5 CBC with Differential/Platelets  2. Cat bite of right forearm with infection, initial encounter S51.851A CBC with Differential/Platelets   L08.9 ceFAZolin (ANCEF) injection 1 g   W55.01XA cefTRIAXone (ROCEPHIN) injection 1 g  3. Long term current use of anticoagulant Z79.01   4. Contusion of forearm, unspecified laterality, initial encounter S50.10XA    b/l    Will need additional abx at discretion of ER provider  ROCEPHIN 1 [place]  RECOMMEND YOU GO TO THE ER AND HAVE ABSCESSED EVALUATED FOR POTENTIAL I & D  Will call with lab results  Monitor BP at home and cal if it cont to be elevated  May soak right wrist in warm epsom salt 2 times daily for 5 days  Continue current medications as ordered  Td UTD  She has aged out of colonoscopies; will check Hgb today  Follow up as scheduled or sooner if need be   [facility] Gold  [facility] [address] [place], [place] [address] [phone] Cell (Monday-Friday 8 AM - 5 PM) [phone] After 5 PM and follow prompts

## 2018-03-13 NOTE — Patient Instructions (Addendum)
ROCEPHIN 1 GRAM INJECTION GIVEN TODAY  RECOMMEND YOU GO TO THE ER AND HAVE ABSCESSED EVALUATED FOR POTENTIAL I & D  Will call with lab results  May soak right wrist in warm epsom salt 2 times daily for 5 days  Continue current medications as ordered  Follow up as scheduled or sooner if need be   Animal Bite Animal bite wounds can get infected. It is important to get proper medical treatment. Ask your doctor if you need rabies treatment. Follow these instructions at home: Wound care  Follow instructions from your doctor about how to take care of your wound. Make sure you: ? Wash your hands with soap and water before you change your bandage (dressing). If you cannot use soap and water, use hand sanitizer. ? Change your bandage as told by your doctor. ? Leave stitches (sutures), skin glue, or skin tape (adhesive) strips in place. They may need to stay in place for 2 weeks or longer. If tape strips get loose and curl up, you may trim the loose edges. Do not remove tape strips completely unless your doctor says it is okay.  Check your wound every day for signs of infection. Watch for: ? Redness, swelling, or pain that gets worse. ? Fluid, blood, or pus. General instructions  Take or apply over-the-counter and prescription medicines only as told by your doctor.  If you were prescribed an antibiotic, take or apply it as told by your doctor. Do not stop using the antibiotic even if your condition improves.  Keep the injured area raised (elevated) above the level of your heart while you are sitting or lying down.  If directed, apply ice to the injured area. ? Put ice in a plastic bag. ? Place a towel between your skin and the bag. ? Leave the ice on for 20 minutes, 2-3 times per day.  Keep all follow-up visits as told by your doctor. This is important. Contact a doctor if:  You have redness, swelling, or pain that gets worse.  You have a general feeling of sickness  (malaise).  You feel sick to your stomach (nauseous).  You throw up (vomit).  You have pain that does not get better. Get help right away if:  You have a red streak going away from your wound.  You have fluid, blood, or pus coming from your wound.  You have a fever or chills.  You have trouble moving your injured area.  You have numbness or tingling anywhere on your body. This information is not intended to replace advice given to you by your health care provider. Make sure you discuss any questions you have with your health care provider. Document Released: 09/09/2005 Document Revised: 02/15/2016 Document Reviewed: 01/25/2015 Elsevier Interactive Patient Education  2018 [employer].

## 2018-03-14 ENCOUNTER — Emergency Department (HOSPITAL_COMMUNITY)
Admission: EM | Admit: 2018-03-14 | Discharge: 2018-03-14 | Disposition: A | Discharge status: Home / Self Care | Payer: Medicare Other | Attending: Emergency Medicine | Admitting: Emergency Medicine

## 2018-03-14 ENCOUNTER — Encounter: Payer: Self-pay | Admitting: Internal Medicine

## 2018-03-14 ENCOUNTER — Encounter (HOSPITAL_COMMUNITY): Payer: Self-pay

## 2018-03-14 ENCOUNTER — Other Ambulatory Visit: Payer: Self-pay

## 2018-03-14 DIAGNOSIS — Y998 Other external cause status: Secondary | ICD-10-CM | POA: Insufficient documentation

## 2018-03-14 DIAGNOSIS — I4510 Unspecified right bundle-branch block: Secondary | ICD-10-CM | POA: Diagnosis not present

## 2018-03-14 DIAGNOSIS — Y939 Activity, unspecified: Secondary | ICD-10-CM | POA: Insufficient documentation

## 2018-03-14 DIAGNOSIS — I5032 Chronic diastolic (congestive) heart failure: Secondary | ICD-10-CM | POA: Diagnosis not present

## 2018-03-14 DIAGNOSIS — R2231 Localized swelling, mass and lump, right upper limb: Secondary | ICD-10-CM | POA: Diagnosis present

## 2018-03-14 DIAGNOSIS — S5011XA Contusion of right forearm, initial encounter: Secondary | ICD-10-CM | POA: Insufficient documentation

## 2018-03-14 DIAGNOSIS — W5503XA Scratched by cat, initial encounter: Secondary | ICD-10-CM | POA: Diagnosis not present

## 2018-03-14 DIAGNOSIS — Y929 Unspecified place or not applicable: Secondary | ICD-10-CM | POA: Diagnosis not present

## 2018-03-14 DIAGNOSIS — Z79899 Other long term (current) drug therapy: Secondary | ICD-10-CM | POA: Insufficient documentation

## 2018-03-14 DIAGNOSIS — Z87891 Personal history of nicotine dependence: Secondary | ICD-10-CM | POA: Diagnosis not present

## 2018-03-14 DIAGNOSIS — Z7901 Long term (current) use of anticoagulants: Secondary | ICD-10-CM | POA: Diagnosis not present

## 2018-03-14 DIAGNOSIS — S50811A Abrasion of right forearm, initial encounter: Secondary | ICD-10-CM

## 2018-03-14 DIAGNOSIS — I110 Hypertensive heart disease with heart failure: Secondary | ICD-10-CM | POA: Diagnosis not present

## 2018-03-14 DIAGNOSIS — R0902 Hypoxemia: Secondary | ICD-10-CM | POA: Diagnosis not present

## 2018-03-14 DIAGNOSIS — I10 Essential (primary) hypertension: Secondary | ICD-10-CM | POA: Diagnosis not present

## 2018-03-14 DIAGNOSIS — I480 Paroxysmal atrial fibrillation: Secondary | ICD-10-CM | POA: Insufficient documentation

## 2018-03-14 LAB — CBC WITH DIFFERENTIAL/PLATELET
ABS IMMATURE GRANULOCYTES: 0 10*3/uL (ref 0.0–0.1)
BASOS ABS: 0 10*3/uL (ref 0.0–0.1)
Basophils Relative: 0 %
Eosinophils Absolute: 0.1 10*3/uL (ref 0.0–0.7)
Eosinophils Relative: 1 %
HCT: 42.5 % (ref 36.0–46.0)
HEMOGLOBIN: 13.8 g/dL (ref 12.0–15.0)
Immature Granulocytes: 1 %
LYMPHS ABS: 1.3 10*3/uL (ref 0.7–4.0)
LYMPHS PCT: 19 %
MCH: 33 pg (ref 26.0–34.0)
MCHC: 32.5 g/dL (ref 30.0–36.0)
MCV: 101.7 fL — ABNORMAL HIGH (ref 78.0–100.0)
MONO ABS: 0.7 10*3/uL (ref 0.1–1.0)
MONOS PCT: 11 %
NEUTROS ABS: 4.6 10*3/uL (ref 1.7–7.7)
Neutrophils Relative %: 68 %
Platelets: 199 10*3/uL (ref 150–400)
RBC: 4.18 MIL/uL (ref 3.87–5.11)
RDW: 14.9 % (ref 11.5–15.5)
WBC: 6.7 10*3/uL (ref 4.0–10.5)

## 2018-03-14 LAB — I-STAT CHEM 8, ED
BUN: 15 mg/dL (ref 6–20)
CALCIUM ION: 1.02 mmol/L — AB (ref 1.15–1.40)
CHLORIDE: 107 mmol/L (ref 101–111)
Creatinine, Ser: 0.8 mg/dL (ref 0.44–1.00)
Glucose, Bld: 121 mg/dL — ABNORMAL HIGH (ref 65–99)
HCT: 42 % (ref 36.0–46.0)
Hemoglobin: 14.3 g/dL (ref 12.0–15.0)
Potassium: 4.5 mmol/L (ref 3.5–5.1)
Sodium: 137 mmol/L (ref 135–145)
TCO2: 21 mmol/L — ABNORMAL LOW (ref 22–32)

## 2018-03-14 LAB — I-STAT CG4 LACTIC ACID, ED: Lactic Acid, Venous: 1.69 mmol/L (ref 0.5–1.9)

## 2018-03-14 MED ORDER — AMOXICILLIN-POT CLAVULANATE 875-125 MG PO TABS: 1.0000 | ORAL_TABLET | Freq: Two times a day (BID) | ORAL | 0 refills | Status: DC

## 2018-03-14 MED ORDER — LIDOCAINE-EPINEPHRINE (PF) 2 %-1:200000 IJ SOLN
10.0000 mL | Freq: Once | INTRAMUSCULAR | Status: AC
  Administered 2018-03-14: 10 mL via INTRADERMAL
  Filled 2018-03-14: qty 20

## 2018-03-14 MED ORDER — MORPHINE SULFATE (PF) 4 MG/ML IV SOLN
4.0000 mg | Freq: Once | INTRAVENOUS | Status: AC
  Administered 2018-03-14: 4 mg via INTRAVENOUS
  Filled 2018-03-14: qty 1

## 2018-03-14 MED ORDER — AMPICILLIN-SULBACTAM SODIUM 3 (2-1) G IJ SOLR
3.0000 g | Freq: Once | INTRAMUSCULAR | Status: AC
  Administered 2018-03-14: 3 g via INTRAVENOUS
  Filled 2018-03-14: qty 3

## 2018-03-14 NOTE — ED Triage Notes (Signed)
Pt presents for evaluation of cat scratch. Pt reports occurred last Saturday and was seen by PCP this week, given IM PCN with no improvement. Pt with redness to elbow, some complaint of pain and pus to site. Pt is hypertensive and took PRN chlorthalidone at 1010. Took hydrocodone at 0915 for pain.

## 2018-03-14 NOTE — ED Notes (Signed)
EDP at bedside  

## 2018-03-14 NOTE — Discharge Instructions (Signed)
You have a large bruise to your right forearm from recent cat scratch.  Please change dressing daily and monitor for worsening changes or signs of infection.  Take antibiotic as prescribed for the full duration.  Return if you have any concerns.  Follow up with your primary care provider next week for recheck of wound.

## 2018-03-14 NOTE — ED Provider Notes (Signed)
[facility] EMERGENCY DEPARTMENT Provider Note   CSN: [id number] Arrival date & time: 03/14/18  1051     History   Chief Complaint Chief Complaint  Patient presents with  . Wound Infection    HPI [name] is a 84 y.o. female.  HPI   84 year old female with history of PAF currently on Eliquis, CHF, hypertension, alcohol abuse brought here via EMS from home for evaluation of potential arm infection.  Patient reports 6 days ago she accidentally startled her cat who is up-to-date with immunization.  The cat clawed her right wrist.  Since then she noticed pain and swelling to the affected area.  She was seen by her PCP yesterday for the wound.  Her PCP was concern for potential abscess and recommend patient to follow-up in the ER for further care.  Patient did receive Rocephin IV during the visit.  Patient mention at rest, the pain is 3 out of 10, with movement, pain is 6 out of 10.  Pain is nonradiating.  No associated fever, chills, focal numbness or weakness.  She is currently on blood thinner medication.  She is up-to-date with tetanus.  She is right-hand dominant.  Denies any numbness distal to the injury.  Past Medical History:  Diagnosis Date  . A-fib ([place])   . Abdominal pain, unspecified site   . Abnormality of gait 06/02/2013  . Arthritis   . Cataracts, bilateral   . Depression   . Diverticulosis of colon (without mention of hemorrhage)   . Diverticulosis of colon (without mention of hemorrhage) 10/27/2012  . Edema 10/27/2012  . GI bleed 06/2002  . Hemorrhage of gastrointestinal tract, unspecified   . History of stomach ulcers   . Hypertension   . Insomnia, unspecified 10/27/2012  . Localized osteoarthrosis not specified whether primary or secondary, lower leg 05/31/2011  . Migraines   . Nausea alone 10/28/2012  . Obesity, unspecified 10/27/2012  . Osteoarthrosis, unspecified whether generalized or localized, lower leg   . Other acariasis    peripherial  neuropathy  . Other and unspecified alcohol dependence, continuous drinking behavior 10/28/2012  . Palpitations   . PVC (premature ventricular contraction)   . Unspecified hereditary and idiopathic peripheral neuropathy   . UTI (lower urinary tract infection)    pt state uti w/ no pain    Patient Active Problem List   Diagnosis Date Noted  . Chronic diastolic heart failure ([place]) 12/10/2017  . Urinary incontinence, mixed 08/18/2017  . Hypercoagulable state due to atrial fibrillation ([place]) 08/06/2017  . Major depressive disorder with single episode, in full remission ([place]) 08/06/2017  . Gastroesophageal reflux disease 04/29/2017  . Diarrhea of presumed infectious origin 04/29/2017  . Lumbar paraspinal muscle spasm 05/20/2016  . Pruritus 11/08/2013  . Hx of peptic ulcer 06/13/2013  . Hyperglycemia 06/07/2013  . DJD (degenerative joint disease) 06/07/2013  . Hypokalemia 06/06/2013  . Abnormality of gait 06/02/2013  . Unintentional weight loss 03/29/2013  . Cough 03/29/2013  . Tremor 01/11/2013  . Nausea 01/11/2013  . Depression   . Alcohol dependence, continuous ([place]) 10/28/2012  . Insomnia 10/27/2012  . Paroxysmal atrial fibrillation (H[facility])   . PVC (premature ventricular contraction)   . Essential hypertension 10/08/2012    Past Surgical History:  Procedure Laterality Date  . ARTHROSCOPIC REPAIR ACL    . BREAST BIOPSY  1973   left  . BREAST REDUCTION SURGERY  12/23/2003  . cataracts    . CHOLECYSTECTOMY  2005  . TONSILLECTOMY AND  ADENOIDECTOMY  1941     OB History    Gravida  0   Para  0   Term  0   Preterm  0   AB  0   Living  0     SAB  0   TAB  0   Ectopic  0   Multiple  0   Live Births  0            Home Medications    Prior to Admission medications   Medication Sig Start Date End Date Taking? Authorizing Provider  acetaminophen (TYLENOL) 325 MG tablet Take 650 mg by mouth 2 (two) times daily. For pain     [provider]    chlorthalidone (HYGROTON) 25 MG tablet Take 25 mg by mouth daily as needed. (SBP >180 mmHg)     [provider]  ELIQUIS 5 MG TABS tablet TAKE 1 TABLET BY MOUTH TWICE DAILY 01/19/18   Reed, Tiffany L, DO  famotidine (PEPCID) 20 MG tablet Take 1 tablet (20 mg total) by mouth at bedtime. 12/10/17   Reed, Tiffany L, DO  flecainide (TAMBOCOR) 50 MG tablet TAKE 1 TABLET BY MOUTH TWICE DAILY FOR ATRIAL FIBRILLATION 01/26/18   Reed, Tiffany L, DO  furosemide (LASIX) 20 MG tablet Take 1 tablet (20 mg total) by mouth as needed. 02/14/17   Almyra Deforest, PA  HYDROcodone-acetaminophen (NORCO) 5-325 MG tablet Take 1 tablet by mouth every 6 (six) hours as needed for moderate pain. One to two tabs every 4-6 hours for pain 02/23/18   Pete Pelt, PA-C  indomethacin (INDOCIN) 50 MG capsule Take 1 capsule (50 mg total) by mouth 3 (three) times daily with meals. Patient not taking: Reported on 03/13/2018 03/06/18   Gildardo Cranker, DO  irbesartan (AVAPRO) 75 MG tablet Take 1 tablet (75 mg total) by mouth daily. 05/23/17   Tanda Rockers, MD  nebivolol (BYSTOLIC) 10 MG tablet Take 1 tablet (10 mg total) daily by mouth. 08/06/17   Reed, Tiffany L, DO  ondansetron (ZOFRAN) 4 MG tablet Take 1 tablet (4 mg total) by mouth 2 (two) times daily as needed for nausea. 12/10/17   Reed, Tiffany L, DO  Phenylephrine HCl (NEO-SYNEPHRINE NA) Place 1 spray into the nose 2 (two) times daily as needed (congestion).    [provider]  sennosides-docusate sodium (SENOKOT-S) 8.6-50 MG tablet Take 1 tablet by mouth as needed.     [provider]  sodium chloride (OCEAN) 0.65 % SOLN nasal spray Place 1 spray into both nostrils daily as needed for congestion.    [provider]  zolpidem (AMBIEN) 5 MG tablet TAKE 1 TABLET BY MOUTH EVERY NIGHT AT BEDTIME AS NEEDED FOR SLEEP 02/19/18   Gayland Curry, DO    Family History Family History  Problem Relation Age of Onset  . Breast cancer Mother   . Hypertension  Mother   . Hypertension Father     Social History Social History   Tobacco Use  . Smoking status: Former Smoker    Packs/day: 1.00    Years: 30.00    Pack years: 30.00    Types: Cigarettes    Last attempt to quit: 10/09/1983    Years since quitting: 34.4  . Smokeless tobacco: Never Used  Substance Use Topics  . Alcohol use: Yes    Alcohol/week: 0.6 - 1.2 oz    Types: 1 - 2 Shots of liquor per week    Comment: 2 DRINKS A  DAY - daily  scotch   . Drug use: No     Allergies   Mirtazapine; Nickel; Other; and Prednisone   Review of Systems Review of Systems  All other systems reviewed and are negative.    Physical Exam Updated Vital Signs BP (!) 220/75 (BP Location: Left Arm)   Pulse 72   Temp 97.9 F (36.6 C) (Oral)   Resp 18   LMP  (LMP Unknown)   SpO2 97%   Physical Exam  Constitutional: She is oriented to person, place, and time. She appears well-developed and well-nourished. No distress.  HENT:  Head: Atraumatic.  Eyes: Conjunctivae are normal.  Neck: Neck supple.  Cardiovascular: Normal rate and regular rhythm.  Pulmonary/Chest: Effort normal and breath sounds normal.  Abdominal: Soft. She exhibits no distension. There is no tenderness.  Musculoskeletal: She exhibits tenderness (Right forearm: There is an indurated subcutaneous nodule 2x2cm to dorsum of distal forearm with fluctuance, oozing blood with surrounding skin ecchymosis extending to midforearm.  radial pulse 2+, brisk cap refill distally).  Neurological: She is alert and oriented to person, place, and time.  Skin: No rash noted.  Psychiatric: She has a normal mood and affect.  Nursing note and vitals reviewed.    ED Treatments / Results  Labs (all labs ordered are listed, but only abnormal results are displayed) Labs Reviewed  CBC WITH DIFFERENTIAL/PLATELET - Abnormal; Notable for the following components:      Result Value   MCV 101.7 (*)    All other components within normal limits    I-STAT CHEM 8, ED - Abnormal; Notable for the following components:   Glucose, Bld 121 (*)    Calcium, Ion 1.02 (*)    TCO2 21 (*)    All other components within normal limits  I-STAT CG4 LACTIC ACID, ED    EKG EKG Interpretation  Date/Time:  Saturday March 14 2018 12:09:06 EDT Ventricular Rate:  66 PR Interval:    QRS Duration: 130 QT Interval:  435 QTC Calculation: 456 R Axis:   -41 Text Interpretation:  Sinus rhythm Left bundle branch block new from first prior Confirmed by Pattricia Boss [phone]) on 03/14/2018 1:09:59 PM   Radiology No results found.  Procedures .Marland KitchenIncision and Drainage Date/Time: 03/14/2018 3:34 PM Performed by: Domenic Moras, PA-C Authorized by: Domenic Moras, PA-C   Consent:    Consent obtained:  Verbal   Consent given by:  Patient   Risks discussed:  Incomplete drainage, infection and pain   Alternatives discussed:  No treatment Location:    Type:  Fluid collection   Size:  2x2   Location:  Upper extremity   Upper extremity location:  Wrist   Wrist location:  R wrist Pre-procedure details:    Skin preparation:  Betadine Anesthesia (see MAR for exact dosages):    Anesthesia method:  Local infiltration   Local anesthetic:  Lidocaine 2% WITH epi Procedure type:    Complexity:  Simple Procedure details:    Needle aspiration: no     Incision types:  Stab incision   Incision depth:  Subcutaneous   Scalpel blade:  11   Wound management:  Probed and deloculated   Drainage:  Bloody   Drainage amount:  Moderate   Wound treatment:  Wound left open   Packing materials:  None Post-procedure details:    Patient tolerance of procedure:  Tolerated well, no immediate complications   (including critical care time)  Medications Ordered in ED Medications  Ampicillin-Sulbactam (UNASYN) 3 g  in sodium chloride 0.9 % 100 mL IVPB (0 g Intravenous Stopped 03/14/18 1411)  morphine 4 MG/ML injection 4 mg (4 mg Intravenous Given 03/14/18 1312)   lidocaine-EPINEPHrine (XYLOCAINE W/EPI) 2 %-1:200000 (PF) injection 10 mL (10 mLs Intradermal Given 03/14/18 1509)     Initial Impression / Assessment and Plan / ED Course  I have reviewed the triage vital signs and the nursing notes.  Pertinent labs & imaging results that were available during my care of the patient were reviewed by me and considered in my medical decision making (see chart for details).     BP (!) 153/93   Pulse 84   Temp 97.9 F (36.6 C) (Oral)   Resp (!) 21   Ht 5\' 7"  (1.702 m)   Wt 85.7 kg (189 lb)   LMP  (LMP Unknown)   SpO2 96%   BMI 29.60 kg/m    Final Clinical Impressions(s) / ED Diagnoses   Final diagnoses:  Traumatic hematoma of right forearm, initial encounter  Cat scratch of forearm, right, initial encounter    ED Discharge Orders        Ordered    amoxicillin-clavulanate (AUGMENTIN) 875-125 MG tablet  2 times daily     03/14/18 1540     1:17 PM Pt was accidentally clawed by her cat 6 days ago.  She has a tense subcutaneous nodule noted to dorsum of R wrist, ttp. It is oozing out small amount of blood, no pus.  Suspect hematoma vs abscess.  No systemic involvement.  Pt found to be hypertensive without cp, sob, abd pain, or focal numbness.  She is UTD with immunization.  Initially BP elevated but it has since subsided.  Pt has hx of HTN, and did take her morning BP medication. She denies CP, SOB, abd pain, weakness numbness or headache.   3:37 PM Incision and drainage procedure was performed.  No purulent discharge.  This is likely a hematoma.  Pressure dressing applied to wound.  Encourage patient to have her dressing changed daily skin infection.  Patient discharged home with Augmentin due to the increased risks of infection from cat scratch.  Return precautions discussed.  Care discussed with Dr. Jeanell Sparrow.    Domenic Moras, PA-C 03/14/18 1542    Pattricia Boss, MD 03/14/18 [phone]

## 2018-03-18 ENCOUNTER — Encounter: Payer: Self-pay | Admitting: Internal Medicine

## 2018-03-18 ENCOUNTER — Non-Acute Institutional Stay: Payer: Medicare Other | Admitting: Internal Medicine

## 2018-03-18 VITALS — BP 112/70 | HR 70 | Temp 98.5°F | Ht 67.0 in | Wt 188.0 lb

## 2018-03-18 DIAGNOSIS — W5503XA Scratched by cat, initial encounter: Secondary | ICD-10-CM

## 2018-03-18 DIAGNOSIS — S5011XA Contusion of right forearm, initial encounter: Secondary | ICD-10-CM

## 2018-03-18 DIAGNOSIS — S50811A Abrasion of right forearm, initial encounter: Secondary | ICD-10-CM

## 2018-03-18 DIAGNOSIS — M109 Gout, unspecified: Secondary | ICD-10-CM | POA: Diagnosis not present

## 2018-03-18 DIAGNOSIS — Z7901 Long term (current) use of anticoagulants: Secondary | ICD-10-CM | POA: Diagnosis not present

## 2018-03-18 DIAGNOSIS — I480 Paroxysmal atrial fibrillation: Secondary | ICD-10-CM

## 2018-03-18 NOTE — Progress Notes (Signed)
Location:  [facility] clinic Provider: Fantasy Donald L. Mariea Clonts, D.O., C.M.D.  Code Status: DNR Goals of Care:  Advanced Directives 03/18/2018  Does Patient Have a Medical Advance Directive? Yes  Type of Paramedic of [place];Living will;Out of facility DNR (pink MOST or yellow form)  Does patient want to make changes to medical advance directive? No - Patient declined  Copy of [place] in Chart? Yes  Pre-existing out of facility DNR order (yellow form or pink MOST form) Yellow form placed in chart (order not valid for inpatient use)   Chief Complaint  Patient presents with  . Follow-up    from ED for Cat Scratch    HPI: Patient is a 84 y.o. female seen today for an acute visit for cat scratch wound.  She rolled over in bed 03/09/18 and startled her cat.  He scratched her right and left arms.  She developed contusion that progressed to a hematoma to her right dorsal forearm after taking indomethacin for a gout flare in her right great toe (already on eliquis for her afib).  Td was up to date.  She saw Dr. Eulas Post on 6/21 for this and dark stools--was heme positive but on eliquis and indocin.  She was given rocephin IM there and advised to go to the ED for I/D.  Labs were done. She did proceed to the ED and a small opening was made in the hematoma with minimal drainage per pt and caregiver.  She's been getting dresing changes done by [place].  The nurse who first saw the wound reports that the size has improved considerably.  It is now the size of a golf ball with ecchymoses that have moved down into the fngers and swelling of wrist and hand are gone.  It remains very tender.  In the ED, she was given an Rx for augmentin which she is still taking.  There are no signs of infection at this time.  She reports her temp is running higher than baseline, but reviewing readings, they've fluctuated from 97.7 to 98.5.    Past Medical History:  Diagnosis Date  .  A-fib ([place])   . Abdominal pain, unspecified site   . Abnormality of gait 06/02/2013  . Arthritis   . Cataracts, bilateral   . Depression   . Diverticulosis of colon (without mention of hemorrhage)   . Diverticulosis of colon (without mention of hemorrhage) 10/27/2012  . Edema 10/27/2012  . GI bleed 06/2002  . Hemorrhage of gastrointestinal tract, unspecified   . History of stomach ulcers   . Hypertension   . Insomnia, unspecified 10/27/2012  . Localized osteoarthrosis not specified whether primary or secondary, lower leg 05/31/2011  . Migraines   . Nausea alone 10/28/2012  . Obesity, unspecified 10/27/2012  . Osteoarthrosis, unspecified whether generalized or localized, lower leg   . Other acariasis    peripherial neuropathy  . Other and unspecified alcohol dependence, continuous drinking behavior 10/28/2012  . Palpitations   . PVC (premature ventricular contraction)   . Unspecified hereditary and idiopathic peripheral neuropathy   . UTI (lower urinary tract infection)    pt state uti w/ no pain    Past Surgical History:  Procedure Laterality Date  . ARTHROSCOPIC REPAIR ACL    . BREAST BIOPSY  1973   left  . BREAST REDUCTION SURGERY  12/23/2003  . cataracts    . CHOLECYSTECTOMY  2005  . TONSILLECTOMY AND ADENOIDECTOMY  1941    Allergies  Allergen Reactions  . Mirtazapine     tremor  . Nickel Other (See Comments)    inflammation  . Other Itching    Acrylic nails  . Prednisone     Causes sleep disturbances     Outpatient Encounter Medications as of 03/18/2018  Medication Sig  . acetaminophen (TYLENOL) 325 MG tablet Take 325-487.5 mg by mouth See admin instructions. Take 325 mg in the morning and take 487.5 mg at night  . amoxicillin-clavulanate (AUGMENTIN) 875-125 MG tablet Take 1 tablet by mouth 2 (two) times daily. One po bid x 7 days  . chlorthalidone (HYGROTON) 25 MG tablet Take 25 mg by mouth daily as needed. (SBP >180 mmHg)   . ELIQUIS 5 MG TABS tablet TAKE 1 TABLET BY  MOUTH TWICE DAILY  . famotidine (PEPCID) 20 MG tablet Take 1 tablet (20 mg total) by mouth at bedtime.  . flecainide (TAMBOCOR) 50 MG tablet TAKE 1 TABLET BY MOUTH TWICE DAILY FOR ATRIAL FIBRILLATION  . furosemide (LASIX) 20 MG tablet Take 1 tablet (20 mg total) by mouth as needed. (Patient taking differently: Take 20 mg by mouth as needed for fluid or edema. )  . HYDROcodone-acetaminophen (NORCO) 5-325 MG tablet Take 1 tablet by mouth every 6 (six) hours as needed for moderate pain. One to two tabs every 4-6 hours for pain (Patient taking differently: Take 1-2 tablets by mouth See admin instructions. One to two tabs every 4-6 hours for pain)  . irbesartan (AVAPRO) 75 MG tablet Take 1 tablet (75 mg total) by mouth daily.  . nebivolol (BYSTOLIC) 10 MG tablet Take 1 tablet (10 mg total) daily by mouth.  . neomycin-bacitracin-polymyxin (NEOSPORIN) ointment Apply 1 application topically 3 (three) times daily as needed for wound care.  . ondansetron (ZOFRAN) 4 MG tablet Take 1 tablet (4 mg total) by mouth 2 (two) times daily as needed for nausea.  Marland Kitchen Phenylephrine HCl (NEO-SYNEPHRINE NA) Place 1 spray into the nose 2 (two) times daily as needed (congestion).  . sennosides-docusate sodium (SENOKOT-S) 8.6-50 MG tablet Take 1 tablet by mouth as needed for constipation.   . sodium chloride (OCEAN) 0.65 % SOLN nasal spray Place 2 sprays into both nostrils daily as needed for congestion.   Marland Kitchen zolpidem (AMBIEN) 5 MG tablet TAKE 1 TABLET BY MOUTH EVERY NIGHT AT BEDTIME AS NEEDED FOR SLEEP   No facility-administered encounter medications on file as of 03/18/2018.     Review of Systems:  Review of Systems  Constitutional: Negative for chills and fever.  Eyes:       Glasses  Respiratory: Negative for shortness of breath.   Cardiovascular: Negative for chest pain.  Gastrointestinal: Negative for blood in stool and melena.  Genitourinary: Negative for dysuria.  Musculoskeletal: Negative for joint pain.  Skin:         Hematoma and ecchymoses right forearm, cat scratch with surrounding ecchymoses left forearm  Neurological: Negative for dizziness.  Endo/Heme/Allergies: Bruises/bleeds easily.  Psychiatric/Behavioral: Negative for memory loss.    Health Maintenance  Topic Date Due  . DEXA SCAN  05/08/2000  . INFLUENZA VACCINE  04/23/2018  . TETANUS/TDAP  09/23/2020  . PNA vac Low Risk Adult  Completed    Physical Exam: Vitals:   03/18/18 1335  BP: 112/70  Pulse: 70  Temp: 98.5 F (36.9 C)  TempSrc: Oral  SpO2: 94%  Weight: 188 lb (85.3 kg)  Height: 5\' 7"  (1.702 m)   Body mass index is 29.44 kg/m. Physical Exam  Constitutional: She  is oriented to person, place, and time. She appears well-developed and well-nourished. No distress.  Cardiovascular:  irreg irreg  Pulmonary/Chest: Effort normal and breath sounds normal.  Musculoskeletal: Normal range of motion.  Comes in wheelchair  Neurological: She is alert and oriented to person, place, and time.  Skin:  Right forearm with golf-ball sized hematoma on dorsum with small opening centrally, tender and lumpy beneath; no foul drainage, only blood; ecchymoses progressing down fingers and some proximal to wound also; small laceration of left forearm with ecchymoses around it also  Psychiatric: She has a normal mood and affect.   Labs reviewed: Basic Metabolic Panel: Recent Labs    04/22/17 0212 04/24/17 0500 03/14/18 1143  NA 133* 139 137  K 3.6 3.2* 4.5  CL  --   --  107  GLUCOSE  --   --  121*  BUN 15 7 15   CREATININE 0.8 0.7 0.80   Liver Function Tests: Recent Labs    04/22/17 0212  AST 20  ALT 13  ALKPHOS 86   No results for input(s): LIPASE, AMYLASE in the last 8760 hours. No results for input(s): AMMONIA in the last 8760 hours. CBC: Recent Labs    09/08/17 1631 03/13/18 0123 03/14/18 1136 03/14/18 1143  WBC 7.3 5.9 6.7  --   NEUTROABS 4,387 3,794 4.6  --   HGB 15.3 13.4 13.8 14.3  HCT 43.9 38.5 42.5 42.0   MCV 94.2 96.5 101.7*  --   PLT 243 261 199  --    Lipid Panel: No results for input(s): CHOL, HDL, LDLCALC, TRIG, CHOLHDL, LDLDIRECT in the last 8760 hours. Lab Results  Component Value Date   HGBA1C 5.3 10/03/2015    Procedures since last visit: Xr Knee 1-2 Views Left  Result Date: 02/23/2018 Radiographs: AP and lateral views of both knees shows end-stage arthritis with bone-on-bone medial compartment and severe patellofemoral arthritic changes.  No acute fractures no bony other maladies otherwise   Xr Knee 1-2 Views Right  Result Date: 02/23/2018 Radiographs: AP and lateral views of both knees shows end-stage arthritis with bone-on-bone medial compartment and severe patellofemoral arthritic changes.  No acute fractures no bony other maladies otherwise    Assessment/Plan 1. Cat scratch of forearm, right, initial encounter - complete augmentin course  - AMB referral to wound care center  2. Traumatic hematoma of right forearm, initial encounter -said to be improved, but I'm concerned about how this looks and the duration of time it may take to heal -took a photo of it, but did not do properly to upload for epic so if wound care physician needs photo, it can be sent at their request - AMB referral to wound care center  3. Long term current use of anticoagulant -continues on eliquis therapy  4. Paroxysmal atrial fibrillation (HCC) -cont bystolic for rate control and eliquis anticoagulation, flecainide antiarrhythmic as per cardiology  5. Right great toe acute gout -resolved after indomethacin  Labs/tests ordered:   Orders Placed This Encounter  Procedures  . AMB referral to wound care center    Referral Priority:   Routine    Referral Type:   Consultation    Number of Visits Requested:   1   Next appt:  04/15/2018  Onie Kasparek L. Chey Cho, D.O. [facility] [address], [place] [address] Cell Phone (Mon-Fri 8am-5pm):   [phone] On Call:  [phone] & follow prompts after 5pm & weekends Office Phone:  [phone] Office  Fax:  [phone]

## 2018-03-19 DIAGNOSIS — B351 Tinea unguium: Secondary | ICD-10-CM | POA: Diagnosis not present

## 2018-03-20 ENCOUNTER — Other Ambulatory Visit: Payer: Self-pay | Admitting: Internal Medicine

## 2018-03-25 ENCOUNTER — Encounter: Payer: Self-pay | Admitting: Internal Medicine

## 2018-04-07 ENCOUNTER — Telehealth: Payer: Self-pay

## 2018-04-07 DIAGNOSIS — S50811A Abrasion of right forearm, initial encounter: Secondary | ICD-10-CM

## 2018-04-07 DIAGNOSIS — M109 Gout, unspecified: Secondary | ICD-10-CM

## 2018-04-07 DIAGNOSIS — W5503XA Scratched by cat, initial encounter: Principal | ICD-10-CM

## 2018-04-07 DIAGNOSIS — E785 Hyperlipidemia, unspecified: Secondary | ICD-10-CM

## 2018-04-07 DIAGNOSIS — Z7901 Long term (current) use of anticoagulants: Secondary | ICD-10-CM

## 2018-04-07 NOTE — Telephone Encounter (Signed)
Orders placed.  At the time of her last visit, she was going to get them at [facility] [facility] and orders were written here.  We'll have to cancel them so the phlebotomist isn't looking for her here in the morning.

## 2018-04-07 NOTE — Telephone Encounter (Signed)
Patient is scheduled for fasting labs to be drawn at the office on 04/08/18. Patient does not want to have labs done at [facility].   Please place orders.

## 2018-04-08 ENCOUNTER — Other Ambulatory Visit: Payer: Medicare Other

## 2018-04-08 ENCOUNTER — Encounter (HOSPITAL_BASED_OUTPATIENT_CLINIC_OR_DEPARTMENT_OTHER): Payer: Medicare Other

## 2018-04-08 DIAGNOSIS — W5503XA Scratched by cat, initial encounter: Secondary | ICD-10-CM

## 2018-04-08 DIAGNOSIS — E785 Hyperlipidemia, unspecified: Secondary | ICD-10-CM | POA: Diagnosis not present

## 2018-04-08 DIAGNOSIS — S50811A Abrasion of right forearm, initial encounter: Secondary | ICD-10-CM | POA: Diagnosis not present

## 2018-04-08 DIAGNOSIS — Z7901 Long term (current) use of anticoagulants: Secondary | ICD-10-CM

## 2018-04-08 DIAGNOSIS — M109 Gout, unspecified: Secondary | ICD-10-CM | POA: Diagnosis not present

## 2018-04-08 LAB — COMPLETE METABOLIC PANEL WITH GFR
AG Ratio: 1.8 (calc) (ref 1.0–2.5)
ALT: 11 U/L (ref 6–29)
AST: 17 U/L (ref 10–35)
Albumin: 4.3 g/dL (ref 3.6–5.1)
Alkaline phosphatase (APISO): 87 U/L (ref 33–130)
BUN: 12 mg/dL (ref 7–25)
CO2: 27 mmol/L (ref 20–32)
Calcium: 9.5 mg/dL (ref 8.6–10.4)
Chloride: 98 mmol/L (ref 98–110)
Creat: 0.85 mg/dL (ref 0.60–0.88)
GFR, Est African American: 74 mL/min/{1.73_m2} (ref >=60)
GFR, Est Non African American: 64 mL/min/{1.73_m2} (ref >=60)
Globulin: 2.4 g/dL (calc) (ref 1.9–3.7)
Glucose, Bld: 119 mg/dL — ABNORMAL HIGH (ref 65–99)
Potassium: 4.8 mmol/L (ref 3.5–5.3)
Sodium: 135 mmol/L (ref 135–146)
Total Bilirubin: 0.7 mg/dL (ref 0.2–1.2)
Total Protein: 6.7 g/dL (ref 6.1–8.1)

## 2018-04-08 LAB — LIPID PANEL
Cholesterol: 198 mg/dL (ref <200)
HDL: 74 mg/dL (ref >50)
LDL Cholesterol (Calc): 106 mg/dL (calc) — ABNORMAL HIGH
Non-HDL Cholesterol (Calc): 124 mg/dL (calc) (ref <130)
Total CHOL/HDL Ratio: 2.7 (calc) (ref <5.0)
Triglycerides: 87 mg/dL (ref <150)

## 2018-04-08 LAB — CBC WITH DIFFERENTIAL/PLATELET
Basophils Absolute: 32 cells/uL (ref 0–200)
Basophils Relative: 0.6 %
Eosinophils Absolute: 81 cells/uL (ref 15–500)
Eosinophils Relative: 1.5 %
HCT: 42.9 % (ref 35.0–45.0)
Hemoglobin: 14.7 g/dL (ref 11.7–15.5)
Lymphs Abs: 1561 cells/uL (ref 850–3900)
MCH: 33.9 pg — ABNORMAL HIGH (ref 27.0–33.0)
MCHC: 34.3 g/dL (ref 32.0–36.0)
MCV: 98.8 fL (ref 80.0–100.0)
MPV: 9.6 fL (ref 7.5–12.5)
Monocytes Relative: 11.4 %
Neutro Abs: 3110 cells/uL (ref 1500–7800)
Neutrophils Relative %: 57.6 %
Platelets: 206 10*3/uL (ref 140–400)
RBC: 4.34 10*6/uL (ref 3.80–5.10)
RDW: 14.7 % (ref 11.0–15.0)
Total Lymphocyte: 28.9 %
WBC mixed population: 616 cells/uL (ref 200–950)
WBC: 5.4 10*3/uL (ref 3.8–10.8)

## 2018-04-08 LAB — URIC ACID: Uric Acid, Serum: 8.2 mg/dL — ABNORMAL HIGH (ref 2.5–7.0)

## 2018-04-09 ENCOUNTER — Other Ambulatory Visit: Payer: Self-pay | Admitting: *Deleted

## 2018-04-09 ENCOUNTER — Encounter: Payer: Self-pay | Admitting: *Deleted

## 2018-04-09 MED ORDER — ALLOPURINOL 100 MG PO TABS: 100.0000 mg | ORAL_TABLET | Freq: Every day | ORAL | 1 refills | Status: DC

## 2018-04-15 ENCOUNTER — Encounter: Payer: Self-pay | Admitting: Internal Medicine

## 2018-04-15 ENCOUNTER — Non-Acute Institutional Stay: Payer: Medicare Other | Admitting: Internal Medicine

## 2018-04-15 VITALS — BP 128/60 | HR 66 | Temp 97.8°F | Ht 67.0 in | Wt 186.0 lb

## 2018-04-15 DIAGNOSIS — M109 Gout, unspecified: Secondary | ICD-10-CM | POA: Diagnosis not present

## 2018-04-15 DIAGNOSIS — S50811S Abrasion of right forearm, sequela: Secondary | ICD-10-CM | POA: Diagnosis not present

## 2018-04-15 DIAGNOSIS — W5503XS Scratched by cat, sequela: Secondary | ICD-10-CM

## 2018-04-15 DIAGNOSIS — I10 Essential (primary) hypertension: Secondary | ICD-10-CM | POA: Diagnosis not present

## 2018-04-15 DIAGNOSIS — I480 Paroxysmal atrial fibrillation: Secondary | ICD-10-CM

## 2018-04-15 DIAGNOSIS — G4700 Insomnia, unspecified: Secondary | ICD-10-CM | POA: Diagnosis not present

## 2018-04-15 NOTE — Progress Notes (Signed)
Location:  Occupational psychologist of Service:  Clinic (12)  Provider: Kahdijah Errickson L. Mariea Clonts, D.O., C.M.D.  Code Status: DNR Goals of Care:  Advanced Directives 03/18/2018  Does Patient Have a Medical Advance Directive? Yes  Type of Paramedic of [place];Living will;Out of facility DNR (pink MOST or yellow form)  Does patient want to make changes to medical advance directive? No - Patient declined  Copy of [place] in Chart? Yes  Pre-existing out of facility DNR order (yellow form or pink MOST form) Yellow form placed in chart (order not valid for inpatient use)     Chief Complaint  Patient presents with  . Medical Management of Chronic Issues    40mth follow-up    HPI: Patient is a 84 y.o. female seen today for medical management of chronic diseases.    Had to take chlorthalidone once in the past month for her bp which was over 190.  Otherwise bp at goal.  She is sleeping well with ambien for rest.  Reports that the allopurinol makes her feel blah, but says she might feel blah anyhow and she does not want to try uloric instead b/c she has a 90 day supply of allopurinol.  Afib on eliquis therapy.  Rate controlled.    Back pain ok except when she gets a bad muscle spasm once in a while.    Past Medical History:  Diagnosis Date  . A-fib ([place])   . Abdominal pain, unspecified site   . Abnormality of gait 06/02/2013  . Arthritis   . Cataracts, bilateral   . Depression   . Diverticulosis of colon (without mention of hemorrhage)   . Diverticulosis of colon (without mention of hemorrhage) 10/27/2012  . Edema 10/27/2012  . GI bleed 06/2002  . Hemorrhage of gastrointestinal tract, unspecified   . History of stomach ulcers   . Hypertension   . Insomnia, unspecified 10/27/2012  . Localized osteoarthrosis not specified whether primary or secondary, lower leg 05/31/2011  . Migraines   . Nausea alone 10/28/2012  . Obesity,  unspecified 10/27/2012  . Osteoarthrosis, unspecified whether generalized or localized, lower leg   . Other acariasis    peripherial neuropathy  . Other and unspecified alcohol dependence, continuous drinking behavior 10/28/2012  . Palpitations   . PVC (premature ventricular contraction)   . Unspecified hereditary and idiopathic peripheral neuropathy   . UTI (lower urinary tract infection)    pt state uti w/ no pain    Past Surgical History:  Procedure Laterality Date  . ARTHROSCOPIC REPAIR ACL    . BREAST BIOPSY  1973   left  . BREAST REDUCTION SURGERY  12/23/2003  . cataracts    . CHOLECYSTECTOMY  2005  . TONSILLECTOMY AND ADENOIDECTOMY  1941    Allergies  Allergen Reactions  . Mirtazapine     tremor  . Nickel Other (See Comments)    inflammation  . Other Itching    Acrylic nails  . Prednisone     Causes sleep disturbances     Outpatient Encounter Medications as of 04/15/2018  Medication Sig  . acetaminophen (TYLENOL) 325 MG tablet Take 325-487.5 mg by mouth See admin instructions. Take 325 mg in the morning and take 487.5 mg at night  . allopurinol (ZYLOPRIM) 100 MG tablet Take 1 tablet (100 mg total) by mouth daily.  . chlorthalidone (HYGROTON) 25 MG tablet Take 25 mg by mouth daily as needed. (SBP >180 mmHg)   .  ELIQUIS 5 MG TABS tablet TAKE 1 TABLET BY MOUTH TWICE DAILY  . famotidine (PEPCID) 20 MG tablet Take 1 tablet (20 mg total) by mouth at bedtime.  . flecainide (TAMBOCOR) 50 MG tablet TAKE 1 TABLET BY MOUTH TWICE DAILY FOR ATRIAL FIBRILLATION  . furosemide (LASIX) 20 MG tablet Take 1 tablet (20 mg total) by mouth as needed. (Patient taking differently: Take 20 mg by mouth as needed for fluid or edema. )  . irbesartan (AVAPRO) 75 MG tablet Take 1 tablet (75 mg total) by mouth daily.  . nebivolol (BYSTOLIC) 10 MG tablet Take 1 tablet (10 mg total) daily by mouth.  . ondansetron (ZOFRAN) 4 MG tablet Take 1 tablet (4 mg total) by mouth 2 (two) times daily as needed for  nausea.  Marland Kitchen Phenylephrine HCl (NEO-SYNEPHRINE NA) Place 1 spray into the nose 2 (two) times daily as needed (congestion).  . sodium chloride (OCEAN) 0.65 % SOLN nasal spray Place 2 sprays into both nostrils daily as needed for congestion.   Marland Kitchen zolpidem (AMBIEN) 5 MG tablet TAKE 1 TABLET BY MOUTH AT BEDTIME AS NEEDED FOR SLEEP  . [DISCONTINUED] amoxicillin-clavulanate (AUGMENTIN) 875-125 MG tablet Take 1 tablet by mouth 2 (two) times daily. One po bid x 7 days  . [DISCONTINUED] HYDROcodone-acetaminophen (NORCO) 5-325 MG tablet Take 1 tablet by mouth every 6 (six) hours as needed for moderate pain. One to two tabs every 4-6 hours for pain (Patient taking differently: Take 1-2 tablets by mouth See admin instructions. One to two tabs every 4-6 hours for pain)  . [DISCONTINUED] neomycin-bacitracin-polymyxin (NEOSPORIN) ointment Apply 1 application topically 3 (three) times daily as needed for wound care.  . [DISCONTINUED] sennosides-docusate sodium (SENOKOT-S) 8.6-50 MG tablet Take 1 tablet by mouth as needed for constipation.    No facility-administered encounter medications on file as of 04/15/2018.     Review of Systems:  Review of Systems  Constitutional: Negative for chills and fever.  HENT: Negative for congestion.   Eyes: Negative for blurred vision.  Respiratory: Negative for cough and shortness of breath.   Cardiovascular: Negative for chest pain, palpitations and leg swelling.  Gastrointestinal: Negative for abdominal pain, blood in stool, constipation, heartburn and melena.  Genitourinary: Negative for dysuria.  Musculoskeletal: Positive for back pain. Negative for falls and joint pain.  Neurological: Negative for dizziness and loss of consciousness.  Endo/Heme/Allergies: Bruises/bleeds easily.  Psychiatric/Behavioral: Negative for depression and memory loss. The patient has insomnia. The patient is not nervous/anxious.     Health Maintenance  Topic Date Due  . DEXA SCAN  05/08/2000    . INFLUENZA VACCINE  04/23/2018  . TETANUS/TDAP  09/23/2020  . PNA vac Low Risk Adult  Completed    Physical Exam: Vitals:   04/15/18 1047  BP: 128/60  Pulse: 66  Temp: 97.8 F (36.6 C)  TempSrc: Oral  SpO2: 97%  Weight: 186 lb (84.4 kg)  Height: 5\' 7"  (1.702 m)   Body mass index is 29.13 kg/m. Physical Exam  Constitutional: She is oriented to person, place, and time. She appears well-developed and well-nourished. No distress.  Cardiovascular: Intact distal pulses.  irreg irreg  Pulmonary/Chest: Effort normal and breath sounds normal. No respiratory distress.  Abdominal: Bowel sounds are normal.  Musculoskeletal: Normal range of motion.  Comes in wheelchair  Neurological: She is alert and oriented to person, place, and time.  Skin:  Cat scratch has healed  Psychiatric: She has a normal mood and affect.    Labs reviewed: Basic  Metabolic Panel: Recent Labs    04/24/17 0500 03/14/18 1143 04/08/18 1037  NA 139 137 135  K 3.2* 4.5 4.8  CL  --  107 98  CO2  --   --  27  GLUCOSE  --  121* 119*  BUN 7 15 12   CREATININE 0.7 0.80 0.85  CALCIUM  --   --  9.5   Liver Function Tests: Recent Labs    04/22/17 0212 04/08/18 1037  AST 20 17  ALT 13 11  ALKPHOS 86  --   BILITOT  --  0.7  PROT  --  6.7   No results for input(s): LIPASE, AMYLASE in the last 8760 hours. No results for input(s): AMMONIA in the last 8760 hours. CBC: Recent Labs    03/13/18 0123 03/14/18 1136 03/14/18 1143 04/08/18 1037  WBC 5.9 6.7  --  5.4  NEUTROABS 3,794 4.6  --  3,110  HGB 13.4 13.8 14.3 14.7  HCT 38.5 42.5 42.0 42.9  MCV 96.5 101.7*  --  98.8  PLT 261 199  --  206   Lipid Panel: Recent Labs    04/08/18 1037  CHOL 198  HDL 74  LDLCALC 106*  TRIG 87  CHOLHDL 2.7   Lab Results  Component Value Date   HGBA1C 5.3 10/03/2015    Assessment/Plan 1. Gouty arthritis of right great toe -cont allopurinol therapy due to high uric acid and recent flare  2. Cat scratch  of forearm, right, sequela -resolved  3. Paroxysmal atrial fibrillation (HCC) -cont eliquis and rate control with bystolic, antiarrhythmic flecainide she's been on for years  4. Insomnia, unspecified type -cont ambien at hs  5. Essential hypertension -bp at goal most days.  Has prn chlorthalidone  Labs/tests ordered:  No orders of the defined types were placed in this encounter.  Next appt:  08/26/2018  Lauralie Blacksher L. Janeen Watson, D.O. [facility] [address], [place] [address] Cell Phone (Mon-Fri 8am-5pm):  [phone] On Call:  [phone] & follow prompts after 5pm & weekends Office Phone:  [phone] Office Fax:  [phone]

## 2018-04-16 ENCOUNTER — Other Ambulatory Visit: Payer: Self-pay | Admitting: Internal Medicine

## 2018-04-16 DIAGNOSIS — R05 Cough: Secondary | ICD-10-CM

## 2018-04-16 DIAGNOSIS — R059 Cough, unspecified: Secondary | ICD-10-CM

## 2018-04-20 ENCOUNTER — Other Ambulatory Visit: Payer: Self-pay | Admitting: Internal Medicine

## 2018-04-20 MED ORDER — ZOLPIDEM TARTRATE 5 MG PO TABS: 5.0000 mg | ORAL_TABLET | Freq: Every evening | ORAL | 0 refills | Status: DC | PRN

## 2018-04-20 NOTE — Telephone Encounter (Signed)
A medication refill was received from pharmacy for zolpidem 5 mg. Rx was called in to pharmacy after verifying last fill date, provider, and quantity on [place] database.   

## 2018-04-24 ENCOUNTER — Ambulatory Visit
Admission: RE | Admit: 2018-04-24 | Discharge: 2018-04-24 | Disposition: A | Discharge status: Home / Self Care | Payer: Medicare Other | Source: Ambulatory Visit | Attending: Internal Medicine | Admitting: Internal Medicine

## 2018-04-24 DIAGNOSIS — M85852 Other specified disorders of bone density and structure, left thigh: Secondary | ICD-10-CM | POA: Diagnosis not present

## 2018-04-24 DIAGNOSIS — M810 Age-related osteoporosis without current pathological fracture: Secondary | ICD-10-CM | POA: Diagnosis not present

## 2018-04-24 DIAGNOSIS — Z780 Asymptomatic menopausal state: Secondary | ICD-10-CM | POA: Diagnosis not present

## 2018-04-24 DIAGNOSIS — E2839 Other primary ovarian failure: Secondary | ICD-10-CM

## 2018-05-19 ENCOUNTER — Other Ambulatory Visit: Payer: Self-pay | Admitting: *Deleted

## 2018-05-19 MED ORDER — ZOLPIDEM TARTRATE 5 MG PO TABS: 5.0000 mg | ORAL_TABLET | Freq: Every evening | ORAL | 0 refills | Status: DC | PRN

## 2018-05-19 NOTE — Telephone Encounter (Signed)
[name] RF Phoned to pharmacy.

## 2018-06-16 ENCOUNTER — Other Ambulatory Visit: Payer: Self-pay | Admitting: Internal Medicine

## 2018-06-18 ENCOUNTER — Other Ambulatory Visit: Payer: Self-pay | Admitting: Internal Medicine

## 2018-07-06 ENCOUNTER — Ambulatory Visit (INDEPENDENT_AMBULATORY_CARE_PROVIDER_SITE_OTHER): Payer: Medicare Other | Admitting: Physician Assistant

## 2018-07-15 ENCOUNTER — Other Ambulatory Visit: Payer: Self-pay | Admitting: Internal Medicine

## 2018-07-15 DIAGNOSIS — I10 Essential (primary) hypertension: Secondary | ICD-10-CM

## 2018-07-17 ENCOUNTER — Other Ambulatory Visit: Payer: Self-pay | Admitting: Internal Medicine

## 2018-07-17 DIAGNOSIS — Z23 Encounter for immunization: Secondary | ICD-10-CM | POA: Diagnosis not present

## 2018-07-17 NOTE — Telephone Encounter (Signed)
[place] Database verified and compliance confirmed   

## 2018-07-20 ENCOUNTER — Encounter (INDEPENDENT_AMBULATORY_CARE_PROVIDER_SITE_OTHER): Payer: Self-pay | Admitting: Physician Assistant

## 2018-07-20 ENCOUNTER — Ambulatory Visit (INDEPENDENT_AMBULATORY_CARE_PROVIDER_SITE_OTHER): Payer: Medicare Other | Admitting: Physician Assistant

## 2018-07-20 DIAGNOSIS — M1712 Unilateral primary osteoarthritis, left knee: Secondary | ICD-10-CM | POA: Diagnosis not present

## 2018-07-20 DIAGNOSIS — M1711 Unilateral primary osteoarthritis, right knee: Secondary | ICD-10-CM

## 2018-07-20 MED ORDER — HYDROCODONE-ACETAMINOPHEN 5-325 MG PO TABS: 1.0000 | ORAL_TABLET | Freq: Four times a day (QID) | ORAL | 0 refills | Status: DC | PRN

## 2018-07-20 MED ORDER — METHYLPREDNISOLONE ACETATE 40 MG/ML IJ SUSP
40.0000 mg | INTRAMUSCULAR | Status: AC | PRN
  Administered 2018-07-20: 40 mg via INTRA_ARTICULAR

## 2018-07-20 MED ORDER — LIDOCAINE HCL 1 % IJ SOLN
5.0000 mL | INTRAMUSCULAR | Status: AC | PRN
  Administered 2018-07-20: 5 mL

## 2018-07-20 MED ORDER — LIDOCAINE HCL 1 % IJ SOLN
2.0000 mL | INTRAMUSCULAR | Status: AC | PRN
  Administered 2018-07-20: 2 mL

## 2018-07-20 NOTE — Progress Notes (Signed)
   Procedure Note  Patient: [name]             Date of Birth: 07-Sep-1933           MRN: [id number]             Visit Date: 07/20/2018  HPI: Mrs. [name] comes in today almost 4 months status post bilateral knee injections.  She is requesting injections to both knees.  She states that the injections really helped her knee pain is just recently returned.  She is had no new injuries to either knee.  She has asking for refill on her hydrocodone which she is been using sparingly for severe pain in her knees.  Bilateral knees: Good range of motion of both knees.  She has tenderness along medial joint line of both knees.  Slight effusion of the right knee.  No abnormal warmth erythema or ecchymosis of either knee.  Procedures: Visit Diagnoses: Unilateral primary osteoarthritis, left knee  Unilateral primary osteoarthritis, right knee  Large Joint Inj: bilateral knee on 07/20/2018 11:32 AM Indications: pain Details: 22 G 1.5 in needle, anterolateral approach  Arthrogram: No  Medications (Right): 2 mL lidocaine 1 %; 40 mg methylPREDNISolone acetate 40 MG/ML Medications (Left): 5 mL lidocaine 1 %; 40 mg methylPREDNISolone acetate 40 MG/ML Aspirate (Left): 10 mL yellow Outcome: tolerated well, no immediate complications Procedure, treatment alternatives, risks and benefits explained, specific risks discussed. Consent was given by the patient. Immediately prior to procedure a time out was called to verify the correct patient, procedure, equipment, support staff and site/side marked as required. Patient was prepped and draped in the usual sterile fashion.    Plan: See her back in 3 months for repeat injections both knees.  She is given 30 tablets of Norco to use for severe knee pain.  Questions were encouraged and answered at length.

## 2018-08-08 ENCOUNTER — Inpatient Hospital Stay (HOSPITAL_BASED_OUTPATIENT_CLINIC_OR_DEPARTMENT_OTHER)
Admission: EM | Admit: 2018-08-08 | Discharge: 2018-08-11 | DRG: 193 | Disposition: A | Discharge status: Home / Self Care | Payer: Medicare Other | Attending: Internal Medicine | Admitting: Internal Medicine

## 2018-08-08 ENCOUNTER — Encounter (HOSPITAL_BASED_OUTPATIENT_CLINIC_OR_DEPARTMENT_OTHER): Payer: Self-pay | Admitting: Emergency Medicine

## 2018-08-08 ENCOUNTER — Other Ambulatory Visit: Payer: Self-pay

## 2018-08-08 ENCOUNTER — Emergency Department (HOSPITAL_BASED_OUTPATIENT_CLINIC_OR_DEPARTMENT_OTHER): Payer: Medicare Other

## 2018-08-08 DIAGNOSIS — Z79899 Other long term (current) drug therapy: Secondary | ICD-10-CM

## 2018-08-08 DIAGNOSIS — J129 Viral pneumonia, unspecified: Secondary | ICD-10-CM | POA: Diagnosis not present

## 2018-08-08 DIAGNOSIS — Z888 Allergy status to other drugs, medicaments and biological substances status: Secondary | ICD-10-CM

## 2018-08-08 DIAGNOSIS — I480 Paroxysmal atrial fibrillation: Secondary | ICD-10-CM | POA: Diagnosis not present

## 2018-08-08 DIAGNOSIS — G4700 Insomnia, unspecified: Secondary | ICD-10-CM | POA: Diagnosis not present

## 2018-08-08 DIAGNOSIS — I5032 Chronic diastolic (congestive) heart failure: Secondary | ICD-10-CM | POA: Diagnosis not present

## 2018-08-08 DIAGNOSIS — R0902 Hypoxemia: Secondary | ICD-10-CM

## 2018-08-08 DIAGNOSIS — Z87891 Personal history of nicotine dependence: Secondary | ICD-10-CM | POA: Diagnosis not present

## 2018-08-08 DIAGNOSIS — I10 Essential (primary) hypertension: Secondary | ICD-10-CM | POA: Diagnosis present

## 2018-08-08 DIAGNOSIS — N390 Urinary tract infection, site not specified: Secondary | ICD-10-CM | POA: Diagnosis not present

## 2018-08-08 DIAGNOSIS — J9601 Acute respiratory failure with hypoxia: Secondary | ICD-10-CM | POA: Diagnosis present

## 2018-08-08 DIAGNOSIS — C801 Malignant (primary) neoplasm, unspecified: Secondary | ICD-10-CM | POA: Diagnosis present

## 2018-08-08 DIAGNOSIS — C787 Secondary malignant neoplasm of liver and intrahepatic bile duct: Secondary | ICD-10-CM | POA: Diagnosis not present

## 2018-08-08 DIAGNOSIS — Z7901 Long term (current) use of anticoagulants: Secondary | ICD-10-CM | POA: Diagnosis not present

## 2018-08-08 DIAGNOSIS — B961 Klebsiella pneumoniae [K. pneumoniae] as the cause of diseases classified elsewhere: Secondary | ICD-10-CM | POA: Diagnosis not present

## 2018-08-08 DIAGNOSIS — G609 Hereditary and idiopathic neuropathy, unspecified: Secondary | ICD-10-CM | POA: Diagnosis not present

## 2018-08-08 DIAGNOSIS — I110 Hypertensive heart disease with heart failure: Secondary | ICD-10-CM | POA: Diagnosis not present

## 2018-08-08 DIAGNOSIS — Z66 Do not resuscitate: Secondary | ICD-10-CM | POA: Diagnosis present

## 2018-08-08 DIAGNOSIS — K219 Gastro-esophageal reflux disease without esophagitis: Secondary | ICD-10-CM | POA: Diagnosis not present

## 2018-08-08 DIAGNOSIS — R05 Cough: Secondary | ICD-10-CM | POA: Diagnosis not present

## 2018-08-08 LAB — URINALYSIS, ROUTINE W REFLEX MICROSCOPIC
Bilirubin Urine: NEGATIVE
Glucose, UA: NEGATIVE mg/dL
Ketones, ur: NEGATIVE mg/dL
NITRITE: POSITIVE — AB
PH: 6 (ref 5.0–8.0)
Protein, ur: NEGATIVE mg/dL

## 2018-08-08 LAB — CBC WITH DIFFERENTIAL/PLATELET
Abs Immature Granulocytes: 0.04 10*3/uL (ref 0.00–0.07)
BASOS ABS: 0 10*3/uL (ref 0.0–0.1)
BASOS PCT: 0 %
EOS PCT: 0 %
Eosinophils Absolute: 0 10*3/uL (ref 0.0–0.5)
HCT: 41.4 % (ref 36.0–46.0)
Hemoglobin: 13.6 g/dL (ref 12.0–15.0)
IMMATURE GRANULOCYTES: 1 %
LYMPHS ABS: 1.3 10*3/uL (ref 0.7–4.0)
LYMPHS PCT: 17 %
MCH: 31.9 pg (ref 26.0–34.0)
MCHC: 32.9 g/dL (ref 30.0–36.0)
MCV: 97.2 fL (ref 80.0–100.0)
Monocytes Absolute: 0.9 10*3/uL (ref 0.1–1.0)
Monocytes Relative: 11 %
Neutro Abs: 5.6 10*3/uL (ref 1.7–7.7)
Neutrophils Relative %: 71 %
PLATELETS: 154 10*3/uL (ref 150–400)
RBC: 4.26 MIL/uL (ref 3.87–5.11)
RDW: 14.1 % (ref 11.5–15.5)
WBC MORPHOLOGY: ABNORMAL
WBC: 7.9 10*3/uL (ref 4.0–10.5)
nRBC: 0 % (ref 0.0–0.2)

## 2018-08-08 LAB — COMPREHENSIVE METABOLIC PANEL
ALK PHOS: 73 U/L (ref 38–126)
ALT: 15 U/L (ref 0–44)
ANION GAP: 11 (ref 5–15)
AST: 24 U/L (ref 15–41)
Albumin: 3.8 g/dL (ref 3.5–5.0)
BUN: 14 mg/dL (ref 8–23)
CO2: 26 mmol/L (ref 22–32)
CREATININE: 0.91 mg/dL (ref 0.44–1.00)
Calcium: 8.9 mg/dL (ref 8.9–10.3)
Chloride: 95 mmol/L — ABNORMAL LOW (ref 98–111)
GFR, EST NON AFRICAN AMERICAN: 57 mL/min — AB (ref >60)
Glucose, Bld: 130 mg/dL — ABNORMAL HIGH (ref 70–99)
Potassium: 3.7 mmol/L (ref 3.5–5.1)
SODIUM: 132 mmol/L — AB (ref 135–145)
Total Bilirubin: 1.2 mg/dL (ref 0.3–1.2)
Total Protein: 6.8 g/dL (ref 6.5–8.1)

## 2018-08-08 LAB — URINALYSIS, MICROSCOPIC (REFLEX): Squamous Epithelial / LPF: NONE SEEN (ref 0–5)

## 2018-08-08 LAB — GLUCOSE, CAPILLARY
Glucose-Capillary: 135 mg/dL — ABNORMAL HIGH (ref 70–99)
Glucose-Capillary: 119 mg/dL — ABNORMAL HIGH (ref 70–99)

## 2018-08-08 LAB — PHOSPHORUS: PHOSPHORUS: 3.2 mg/dL (ref 2.5–4.6)

## 2018-08-08 LAB — I-STAT CG4 LACTIC ACID, ED: LACTIC ACID, VENOUS: 1.53 mmol/L (ref 0.5–1.9)

## 2018-08-08 LAB — BRAIN NATRIURETIC PEPTIDE: B NATRIURETIC PEPTIDE 5: 298.3 pg/mL — AB (ref 0.0–100.0)

## 2018-08-08 LAB — INFLUENZA PANEL BY PCR (TYPE A & B)
INFLAPCR: NEGATIVE
INFLBPCR: NEGATIVE

## 2018-08-08 LAB — MAGNESIUM: Magnesium: 1.7 mg/dL (ref 1.7–2.4)

## 2018-08-08 MED ORDER — LEVOFLOXACIN IN D5W 750 MG/150ML IV SOLN
750.0000 mg | INTRAVENOUS | Status: DC
  Administered 2018-08-08: 750 mg via INTRAVENOUS
  Filled 2018-08-08: qty 150

## 2018-08-08 MED ORDER — FLECAINIDE ACETATE 50 MG PO TABS
50.0000 mg | ORAL_TABLET | Freq: Two times a day (BID) | ORAL | Status: DC
  Administered 2018-08-08 – 2018-08-11 (×6): 50 mg via ORAL
  Filled 2018-08-08 (×7): qty 1

## 2018-08-08 MED ORDER — BUDESONIDE 0.5 MG/2ML IN SUSP: 0.5000 mg | Freq: Two times a day (BID) | RESPIRATORY_TRACT | Status: DC

## 2018-08-08 MED ORDER — FAMOTIDINE 20 MG PO TABS: 20.0000 mg | ORAL_TABLET | Freq: Every day | ORAL | Status: DC

## 2018-08-08 MED ORDER — IRBESARTAN 75 MG PO TABS
75.0000 mg | ORAL_TABLET | Freq: Every day | ORAL | Status: DC
  Administered 2018-08-09 – 2018-08-11 (×3): 75 mg via ORAL
  Filled 2018-08-08 (×3): qty 1

## 2018-08-08 MED ORDER — IPRATROPIUM-ALBUTEROL 0.5-2.5 (3) MG/3ML IN SOLN
3.0000 mL | Freq: Four times a day (QID) | RESPIRATORY_TRACT | Status: DC
  Administered 2018-08-08: 3 mL via RESPIRATORY_TRACT
  Filled 2018-08-08 (×2): qty 3

## 2018-08-08 MED ORDER — NEBIVOLOL HCL 10 MG PO TABS
10.0000 mg | ORAL_TABLET | Freq: Every day | ORAL | Status: DC
  Administered 2018-08-09 – 2018-08-11 (×3): 10 mg via ORAL
  Filled 2018-08-08 (×4): qty 1

## 2018-08-08 MED ORDER — POTASSIUM CHLORIDE CRYS ER 20 MEQ PO TBCR
20.0000 meq | EXTENDED_RELEASE_TABLET | Freq: Once | ORAL | Status: AC
  Administered 2018-08-08: 20 meq via ORAL
  Filled 2018-08-08: qty 1

## 2018-08-08 MED ORDER — SALINE SPRAY 0.65 % NA SOLN
2.0000 | Freq: Two times a day (BID) | NASAL | Status: DC
  Administered 2018-08-08 – 2018-08-11 (×6): 2 via NASAL
  Filled 2018-08-08: qty 44

## 2018-08-08 MED ORDER — ONDANSETRON HCL 4 MG/2ML IJ SOLN: 4.0000 mg | Freq: Four times a day (QID) | INTRAMUSCULAR | Status: DC | PRN

## 2018-08-08 MED ORDER — ACETAMINOPHEN 650 MG RE SUPP: 650.0000 mg | Freq: Four times a day (QID) | RECTAL | Status: DC | PRN

## 2018-08-08 MED ORDER — ONDANSETRON HCL 4 MG PO TABS
4.0000 mg | ORAL_TABLET | Freq: Four times a day (QID) | ORAL | Status: DC | PRN
  Administered 2018-08-09 – 2018-08-11 (×2): 4 mg via ORAL
  Filled 2018-08-08 (×2): qty 1

## 2018-08-08 MED ORDER — ALLOPURINOL 100 MG PO TABS
100.0000 mg | ORAL_TABLET | Freq: Every day | ORAL | Status: DC
  Administered 2018-08-09 – 2018-08-11 (×3): 100 mg via ORAL
  Filled 2018-08-08 (×4): qty 1

## 2018-08-08 MED ORDER — ZOLPIDEM TARTRATE 5 MG PO TABS
5.0000 mg | ORAL_TABLET | Freq: Every evening | ORAL | Status: DC | PRN
  Administered 2018-08-08 – 2018-08-10 (×3): 5 mg via ORAL
  Filled 2018-08-08 (×3): qty 1

## 2018-08-08 MED ORDER — IPRATROPIUM BROMIDE 0.02 % IN SOLN
0.5000 mg | Freq: Four times a day (QID) | RESPIRATORY_TRACT | Status: DC
  Administered 2018-08-08 – 2018-08-10 (×5): 0.5 mg via RESPIRATORY_TRACT
  Filled 2018-08-08 (×5): qty 2.5

## 2018-08-08 MED ORDER — LEVALBUTEROL HCL 1.25 MG/0.5ML IN NEBU
1.2500 mg | INHALATION_SOLUTION | Freq: Four times a day (QID) | RESPIRATORY_TRACT | Status: DC
  Filled 2018-08-08: qty 0.5

## 2018-08-08 MED ORDER — ACETAMINOPHEN 325 MG PO TABS: 650.0000 mg | ORAL_TABLET | Freq: Four times a day (QID) | ORAL | Status: DC | PRN

## 2018-08-08 MED ORDER — ALBUTEROL SULFATE (2.5 MG/3ML) 0.083% IN NEBU: 2.5000 mg | INHALATION_SOLUTION | RESPIRATORY_TRACT | Status: DC | PRN

## 2018-08-08 MED ORDER — KETOROLAC TROMETHAMINE 15 MG/ML IJ SOLN
15.0000 mg | Freq: Once | INTRAMUSCULAR | Status: AC
  Administered 2018-08-08: 15 mg via INTRAVENOUS
  Filled 2018-08-08: qty 1

## 2018-08-08 MED ORDER — MELATONIN 5 MG PO TABS
5.0000 mg | ORAL_TABLET | Freq: Every day | ORAL | Status: DC
  Administered 2018-08-08 – 2018-08-10 (×3): 5 mg via ORAL
  Filled 2018-08-08 (×3): qty 1

## 2018-08-08 MED ORDER — MAGNESIUM SULFATE 2 GM/50ML IV SOLN
2.0000 g | Freq: Once | INTRAVENOUS | Status: AC
  Administered 2018-08-08: 2 g via INTRAVENOUS
  Filled 2018-08-08: qty 50

## 2018-08-08 MED ORDER — PHENYLEPHRINE HCL 1 % NA SOLN: 1.0000 [drp] | Freq: Four times a day (QID) | NASAL | Status: DC | PRN

## 2018-08-08 MED ORDER — HYDROCODONE-ACETAMINOPHEN 5-325 MG PO TABS
1.0000 | ORAL_TABLET | Freq: Four times a day (QID) | ORAL | Status: DC | PRN
  Administered 2018-08-08 – 2018-08-10 (×5): 1 via ORAL
  Filled 2018-08-08 (×5): qty 1

## 2018-08-08 MED ORDER — ALBUTEROL SULFATE (2.5 MG/3ML) 0.083% IN NEBU
INHALATION_SOLUTION | RESPIRATORY_TRACT | Status: AC
  Administered 2018-08-08: 2.5 mg
  Filled 2018-08-08: qty 3

## 2018-08-08 MED ORDER — IPRATROPIUM-ALBUTEROL 0.5-2.5 (3) MG/3ML IN SOLN
RESPIRATORY_TRACT | Status: AC
  Administered 2018-08-08: 3 mL
  Filled 2018-08-08: qty 3

## 2018-08-08 MED ORDER — FAMOTIDINE 20 MG PO TABS
20.0000 mg | ORAL_TABLET | Freq: Two times a day (BID) | ORAL | Status: DC
  Administered 2018-08-09 – 2018-08-11 (×5): 20 mg via ORAL
  Filled 2018-08-08 (×6): qty 1

## 2018-08-08 MED ORDER — APIXABAN 5 MG PO TABS
5.0000 mg | ORAL_TABLET | Freq: Two times a day (BID) | ORAL | Status: DC
  Administered 2018-08-08 – 2018-08-11 (×6): 5 mg via ORAL
  Filled 2018-08-08 (×6): qty 1

## 2018-08-08 MED ORDER — DM-GUAIFENESIN ER 30-600 MG PO TB12
1.0000 | ORAL_TABLET | Freq: Two times a day (BID) | ORAL | Status: DC
  Administered 2018-08-09 – 2018-08-11 (×5): 1 via ORAL
  Filled 2018-08-08 (×5): qty 1

## 2018-08-08 MED ORDER — LEVALBUTEROL HCL 1.25 MG/0.5ML IN NEBU
1.2500 mg | INHALATION_SOLUTION | Freq: Four times a day (QID) | RESPIRATORY_TRACT | Status: DC
  Administered 2018-08-08 – 2018-08-10 (×5): 1.25 mg via RESPIRATORY_TRACT
  Filled 2018-08-08 (×8): qty 0.5

## 2018-08-08 MED ORDER — BUDESONIDE 0.5 MG/2ML IN SUSP
0.5000 mg | Freq: Two times a day (BID) | RESPIRATORY_TRACT | Status: DC
  Administered 2018-08-08 – 2018-08-11 (×6): 0.5 mg via RESPIRATORY_TRACT
  Filled 2018-08-08 (×6): qty 2

## 2018-08-08 NOTE — ED Triage Notes (Signed)
Reports fever and productive cough x 3 days.

## 2018-08-08 NOTE — ED Notes (Signed)
Pt reports that she was told her temperature was 102 yesterday but she didn't feel warm

## 2018-08-08 NOTE — Progress Notes (Signed)
DNR order verified with patient and Hart Carwin, RN. DNR bracelet in place to patient. Will continue to monitor.

## 2018-08-08 NOTE — H&P (Addendum)
History and Physical    [name] BTD:[id number] DOB: 03/06/1933 DOA: 08/08/2018  PCP: Gayland Curry, DO  Patient coming from: Home.  I have personally briefly reviewed patient's old medical records in [place]  Chief Complaint: Cough and fever.  HPI: [name] is a 84 y.o. female with medical history of paroxysmal atrial fibrillation, osteoarthritis, bilateral cataracts, depression, colonic diverticulosis, history of GI bleed/PUD, hypertension, insomnia, migraine headaches, obesity, peripheral neuropathy, history UTI with no pain who is coming from [facility] due to productive cough of yellowish sputum and fever for the past 3 days.  She has had decreased appetite, fatigue, malaise and had a temperature checked yesterday which was 102 F at home.  She complains of left-sided lower chest wall and upper abdomen tenderness with coughing.  She states that she has had intense coughing episodes at times.  She has had some trouble sleeping.  She denies sick contacts or travel history.  She complains of urinary frequency.  She complains of nausea, but no emesis.  She denies sore throat, rhinorrhea, wheezing or hemoptysis.  No chest pain, palpitations, dizziness, diaphoresis, PND, orthopnea or pitting edema of the lower extremities.  She denies diarrhea, constipation, melena or hematochezia.  She denies polyuria, polydipsia, polyphagia or blurred vision.  ED Course: Initial vital signs temperature 98.6 F, pulse 74, respirations 18, blood pressure 123/78 mmHg and O2 sat 91% on room air.  Urinalysis had a cloudy appearance and was positive for nitrates and trace leukocyte esterase.  Only 6-10 WBC, but many bacteria on microscopic examination.  Her CBC was normal.  Her CMP showed a sodium of 132 and chloride of 95 mmol/L.  Her glucose was 130 mg/dL.  All other values are within normal limits.  BNP was 298.3 pg/mL.  Lactic acid was normal.  Her chest radiograph did not show any acute  abnormalities.  Review of Systems: As per HPI otherwise 10 point review of systems negative.   Past Medical History:  Diagnosis Date  . A-fib ([place])   . Abdominal pain, unspecified site   . Abnormality of gait 06/02/2013  . Arthritis   . Cataracts, bilateral   . Depression   . Diverticulosis of colon (without mention of hemorrhage)   . Diverticulosis of colon (without mention of hemorrhage) 10/27/2012  . Edema 10/27/2012  . GI bleed 06/2002  . Hemorrhage of gastrointestinal tract, unspecified   . History of stomach ulcers   . Hypertension   . Insomnia, unspecified 10/27/2012  . Localized osteoarthrosis not specified whether primary or secondary, lower leg 05/31/2011  . Migraines   . Nausea alone 10/28/2012  . Obesity, unspecified 10/27/2012  . Osteoarthrosis, unspecified whether generalized or localized, lower leg   . Other acariasis    peripherial neuropathy  . Other and unspecified alcohol dependence, continuous drinking behavior 10/28/2012  . Palpitations   . PVC (premature ventricular contraction)   . Unspecified hereditary and idiopathic peripheral neuropathy   . UTI (lower urinary tract infection)    pt state uti w/ no pain    Past Surgical History:  Procedure Laterality Date  . ARTHROSCOPIC REPAIR ACL    . BREAST BIOPSY  1973   left  . BREAST REDUCTION SURGERY  12/23/2003  . cataracts    . CHOLECYSTECTOMY  2005  . TONSILLECTOMY AND ADENOIDECTOMY  1941     reports that she quit smoking about 34 years ago. Her smoking use included cigarettes. She has a 30.00 pack-year smoking history. She has never  used smokeless tobacco. She reports that she drinks about 1.0 - 2.0 standard drinks of alcohol per week. She reports that she does not use drugs.  Allergies  Allergen Reactions  . Mirtazapine     tremor  . Nickel Other (See Comments)    inflammation  . Other Itching    Acrylic nails, and trace metals  . Prednisone     Causes sleep disturbances     Family History  Problem  Relation Age of Onset  . Breast cancer Mother   . Hypertension Mother   . Hypertension Father    Prior to Admission medications   Medication Sig Start Date End Date Taking? Authorizing Provider  acetaminophen (TYLENOL) 325 MG tablet Take 325-487.5 mg by mouth See admin instructions. Take 325 mg in the morning and take 487.5 mg at night    [provider]  allopurinol (ZYLOPRIM) 100 MG tablet Take 1 tablet (100 mg total) by mouth daily. 04/09/18   Reed, Tiffany L, DO  apixaban (ELIQUIS) 5 MG TABS tablet TAKE 5 MG BY MOUTH TWICE DAILY 07/15/18   Reed, Tiffany L, DO  BYSTOLIC 10 MG tablet TAKE 1 TABLET BY MOUTH DAILY 07/15/18   Reed, Tiffany L, DO  chlorthalidone (HYGROTON) 25 MG tablet Take 25 mg by mouth daily as needed. (SBP >180 mmHg)     [provider]  famotidine (PEPCID) 20 MG tablet Take 1 tablet (20 mg total) by mouth at bedtime. 12/10/17   Reed, Tiffany L, DO  flecainide (TAMBOCOR) 50 MG tablet TAKE 1 TABLET BY MOUTH TWICE DAILY FOR ATRIAL FIBRILLATION 07/15/18   Reed, Tiffany L, DO  furosemide (LASIX) 20 MG tablet Take 1 tablet (20 mg total) by mouth as needed. Patient taking differently: Take 20 mg by mouth as needed for fluid or edema.  02/14/17   Almyra Deforest, PA  HYDROcodone-acetaminophen (NORCO) 5-325 MG tablet Take 1 tablet by mouth every 6 (six) hours as needed for moderate pain. One to two tabs every 4-6 hours for pain 07/20/18   Pete Pelt, PA-C  irbesartan (AVAPRO) 75 MG tablet TAKE 1 TABLET(75 MG) BY MOUTH DAILY 04/17/18   Reed, Tiffany L, DO  ondansetron (ZOFRAN) 4 MG tablet Take 1 tablet (4 mg total) by mouth 2 (two) times daily as needed for nausea. 12/10/17   Reed, Tiffany L, DO  Phenylephrine HCl (NEO-SYNEPHRINE NA) Place 1 spray into the nose 2 (two) times daily as needed (congestion).    [provider]  sodium chloride (OCEAN) 0.65 % SOLN nasal spray Place 2 sprays into both nostrils daily as needed for congestion.     [provider]   zolpidem (AMBIEN) 5 MG tablet TAKE 1 TABLET BY MOUTH EVERY NIGHT AT BEDTIME AS NEEDED FOR SLEEP 07/17/18   Gayland Curry, DO    Physical Exam: Vitals:   08/08/18 1235 08/08/18 1254 08/08/18 1335 08/08/18 1432  BP: 131/63  (!) 139/56 129/81  Pulse: 82 83 82 97  Resp: 15 (!) 22 (!) 21 18  Temp:    98.4 F (36.9 C)  TempSrc:    Oral  SpO2: 95% 94% 94% 96%  Weight:      Height:        Constitutional: Looks acutely ill. Eyes: PERRL, lids and conjunctivae are mildly injected. ENMT: Mucous membranes are moist. Posterior pharynx looks erythematous, but no exudate. Neck: normal, supple, no masses, no thyromegaly Respiratory: Decreased breath sounds.  Positive bilateral rhonchi and wheezing.  No crackles. Normal respiratory effort.  No accessory muscle use.  Cardiovascular: Regular rate and rhythm, no murmurs / rubs / gallops. No extremity edema. 2+ pedal pulses. No carotid bruits.  Abdomen: Soft, tenderness on upper abdomen/lower chest wall from coughing, no masses palpated. No hepatosplenomegaly. Bowel sounds positive.  Musculoskeletal: no clubbing / cyanosis.  Good ROM, no contractures. Normal muscle tone.  Skin: no rashes, lesions, ulcers on very limited dermatological examination. Neurologic: CN 2-12 grossly intact. Sensation intact, DTR normal. Strength 5/5 in all 4.  Psychiatric: Normal judgment and insight. Alert and oriented x 4. Normal mood.   Labs on Admission: I have personally reviewed following labs and imaging studies  CBC: Recent Labs  Lab 08/08/18 1158  WBC 7.9  NEUTROABS 5.6  HGB 13.6  HCT 41.4  MCV 97.2  PLT 660   Basic Metabolic Panel: Recent Labs  Lab 08/08/18 1158  NA 132*  K 3.7  CL 95*  CO2 26  GLUCOSE 130*  BUN 14  CREATININE 0.91  CALCIUM 8.9  MG 1.7  PHOS 3.2   GFR: Estimated Creatinine Clearance: 52.3 mL/min (by C-G formula based on SCr of 0.91 mg/dL). Liver Function Tests: Recent Labs  Lab 08/08/18 1158  AST 24  ALT 15  ALKPHOS 73   BILITOT 1.2  PROT 6.8  ALBUMIN 3.8   No results for input(s): LIPASE, AMYLASE in the last 168 hours. No results for input(s): AMMONIA in the last 168 hours. Coagulation Profile: No results for input(s): INR, PROTIME in the last 168 hours. Cardiac Enzymes: No results for input(s): CKTOTAL, CKMB, CKMBINDEX, TROPONINI in the last 168 hours. BNP (last 3 results) No results for input(s): PROBNP in the last 8760 hours. HbA1C: No results for input(s): HGBA1C in the last 72 hours. CBG: No results for input(s): GLUCAP in the last 168 hours. Lipid Profile: No results for input(s): CHOL, HDL, LDLCALC, TRIG, CHOLHDL, LDLDIRECT in the last 72 hours. Thyroid Function Tests: No results for input(s): TSH, T4TOTAL, FREET4, T3FREE, THYROIDAB in the last 72 hours. Anemia Panel: No results for input(s): VITAMINB12, FOLATE, FERRITIN, TIBC, IRON, RETICCTPCT in the last 72 hours. Urine analysis:    Component Value Date/Time   COLORURINE YELLOW 08/08/2018 1159   APPEARANCEUR CLOUDY (A) 08/08/2018 1159   LABSPEC <1.005 (L) 08/08/2018 1159   PHURINE 6.0 08/08/2018 1159   GLUCOSEU NEGATIVE 08/08/2018 1159   HGBUR SMALL (A) 08/08/2018 1159   BILIRUBINUR NEGATIVE 08/08/2018 1159   BILIRUBINUR Neg 09/08/2017 1557   KETONESUR NEGATIVE 08/08/2018 1159   PROTEINUR NEGATIVE 08/08/2018 1159   UROBILINOGEN 0.2 09/08/2017 1557   UROBILINOGEN 0.2 12/02/2014 1242   NITRITE POSITIVE (A) 08/08/2018 1159   LEUKOCYTESUR TRACE (A) 08/08/2018 1159    Radiological Exams on Admission: Dg Chest 2 View  Result Date: 08/08/2018 CLINICAL DATA:  Productive cough. EXAM: CHEST - 2 VIEW COMPARISON:  05/23/2017 FINDINGS: Coarse lung markings appear chronic. No focal airspace disease or pulmonary edema. There appears to be dextroscoliosis in the thoracic spine. Heart and mediastinum are stable. Atherosclerotic calcifications at the aortic arch. No large pleural effusions. IMPRESSION: No active cardiopulmonary disease.  Electronically Signed   By: Markus Daft M.D.   On: 08/08/2018 12:48    EKG: Independently reviewed.  Vent. rate 89 BPM PR interval * ms QRS duration 127 ms QT/QTc 393/479 ms P-R-T axes -56 -62 64 Sinus or ectopic atrial rhythm Ventricular premature complex Left bundle branch block old LBBB. dynamic inferior T waves  Assessment/Plan Principal Problem:   Viral pneumonia Admit to MedSurg/inpatient. Continue supplemental  oxygen. Xopenex plus ipratropium as needed nebs. Pulmicort 0.5 mg nebs twice a day. Mucinex-DM twice a day. May use her hydrocodone for persistent cough as well. Check respiratory virus panel. Keep on droplet isolation. Check Legionella urinary antigen. Follow-up blood culture and sensitivity. I believe her urinary symptoms are mostly from cough induced incontinence exacerbation, however I will use Levaquin IV to cover for atypical pneumonia or incipient bacterial pneumonia.  This should cover UTI as well.  Active Problems:   Essential hypertension Continue Bystolic 10 mg p.o. daily. Continue Avapro 75 mg p.o. daily. Monitor BP, HR, renal function electrolytes.    Paroxysmal atrial fibrillation (HCC) CHA?DS?-VASc Score of at least 6. Continue Tambocor 50 mg p.o. twice daily. Continue Bystolic. Continue apixaban 5 mg p.o. twice daily. Supplements were bronchodilators. Electrolytes have been optimized.    Gastroesophageal reflux disease Famotidine 20 mg p.o. twice daily.    Chronic diastolic heart failure (HCC) No signs of decompensation. Continue Bystolic 10 mg p.o. daily. Continue Avapro 75 mg p.o. daily. Continue furosemide 20 mg as needed. Potassium supplementation as needed.   DVT prophylaxis: On apixaban. Code Status: Full code. Family Communication:  Disposition Plan: Admit for IV antibiotic therapy for 2 to 3 days. Consults called:  Admission status: Inpatient/MedSurg.   Reubin Milan MD [facility] Pager [phone].  If  7PM-7AM, please contact night-coverage [email or web address] Password [email or web address]  08/08/2018, 3:55 PM

## 2018-08-08 NOTE — ED Provider Notes (Signed)
[facility] EMERGENCY DEPARTMENT Provider Note   CSN: [id number] Arrival date & time: 08/08/18  1028     History   Chief Complaint Chief Complaint  Patient presents with  . Cough    HPI ROCHELLA BENNER is a 84 y.o. female.  HPI Patient reports that she has had a cough for about 3 days.  She reports that onset it was not too severe but has gotten much worse she reports she has been coughing so hard now that her upper abdomen aches in the muscles.  She reports she is been coughing up green-yellow mucus.  She reports she is not very active at baseline.  She denies that she feels much more short of breath at rest.  She reports she does feel short of breath when she is having big cough episodes.  She reports her nurse check yesterday identified fever up to 102.  She reports she does feel more fatigued. Past Medical History:  Diagnosis Date  . A-fib ([place])   . Abdominal pain, unspecified site   . Abnormality of gait 06/02/2013  . Arthritis   . Cataracts, bilateral   . Depression   . Diverticulosis of colon (without mention of hemorrhage)   . Diverticulosis of colon (without mention of hemorrhage) 10/27/2012  . Edema 10/27/2012  . GI bleed 06/2002  . Hemorrhage of gastrointestinal tract, unspecified   . History of stomach ulcers   . Hypertension   . Insomnia, unspecified 10/27/2012  . Localized osteoarthrosis not specified whether primary or secondary, lower leg 05/31/2011  . Migraines   . Nausea alone 10/28/2012  . Obesity, unspecified 10/27/2012  . Osteoarthrosis, unspecified whether generalized or localized, lower leg   . Other acariasis    peripherial neuropathy  . Other and unspecified alcohol dependence, continuous drinking behavior 10/28/2012  . Palpitations   . PVC (premature ventricular contraction)   . Unspecified hereditary and idiopathic peripheral neuropathy   . UTI (lower urinary tract infection)    pt state uti w/ no pain    Patient Active Problem List   Diagnosis Date Noted  . Viral pneumonia 08/08/2018  . Chronic diastolic heart failure ([place]) 12/10/2017  . Urinary incontinence, mixed 08/18/2017  . Hypercoagulable state due to atrial fibrillation ([place]) 08/06/2017  . Major depressive disorder with single episode, in full remission ([place]) 08/06/2017  . Gastroesophageal reflux disease 04/29/2017  . Diarrhea of presumed infectious origin 04/29/2017  . Lumbar paraspinal muscle spasm 05/20/2016  . Pruritus 11/08/2013  . Hx of peptic ulcer 06/13/2013  . Hyperglycemia 06/07/2013  . DJD (degenerative joint disease) 06/07/2013  . Hypokalemia 06/06/2013  . Abnormality of gait 06/02/2013  . Unintentional weight loss 03/29/2013  . Cough 03/29/2013  . Tremor 01/11/2013  . Nausea 01/11/2013  . Depression   . Alcohol dependence, continuous ([place]) 10/28/2012  . Insomnia 10/27/2012  . Paroxysmal atrial fibrillation ([facility])   . PVC (premature ventricular contraction)   . Essential hypertension 10/08/2012    Past Surgical History:  Procedure Laterality Date  . ARTHROSCOPIC REPAIR ACL    . BREAST BIOPSY  1973   left  . BREAST REDUCTION SURGERY  12/23/2003  . cataracts    . CHOLECYSTECTOMY  2005  . TONSILLECTOMY AND ADENOIDECTOMY  1941     OB History    Gravida  0   Para  0   Term  0   Preterm  0   AB  0   Living  0     SAB  0  TAB  0   Ectopic  0   Multiple  0   Live Births  0            Home Medications    Prior to Admission medications   Medication Sig Start Date End Date Taking? Authorizing Provider  acetaminophen (TYLENOL) 325 MG tablet Take 325-487.5 mg by mouth See admin instructions. Take 325 mg in the morning and take 487.5 mg at night    [provider]  allopurinol (ZYLOPRIM) 100 MG tablet Take 1 tablet (100 mg total) by mouth daily. 04/09/18   Reed, Tiffany L, DO  apixaban (ELIQUIS) 5 MG TABS tablet TAKE 5 MG BY MOUTH TWICE DAILY 07/15/18   Reed, Tiffany L, DO  BYSTOLIC 10 MG tablet TAKE 1 TABLET  BY MOUTH DAILY 07/15/18   Reed, Tiffany L, DO  chlorthalidone (HYGROTON) 25 MG tablet Take 25 mg by mouth daily as needed. (SBP >180 mmHg)     [provider]  famotidine (PEPCID) 20 MG tablet Take 1 tablet (20 mg total) by mouth at bedtime. 12/10/17   Reed, Tiffany L, DO  flecainide (TAMBOCOR) 50 MG tablet TAKE 1 TABLET BY MOUTH TWICE DAILY FOR ATRIAL FIBRILLATION 07/15/18   Reed, Tiffany L, DO  furosemide (LASIX) 20 MG tablet Take 1 tablet (20 mg total) by mouth as needed. Patient taking differently: Take 20 mg by mouth as needed for fluid or edema.  02/14/17   Almyra Deforest, PA  HYDROcodone-acetaminophen (NORCO) 5-325 MG tablet Take 1 tablet by mouth every 6 (six) hours as needed for moderate pain. One to two tabs every 4-6 hours for pain 07/20/18   Pete Pelt, PA-C  irbesartan (AVAPRO) 75 MG tablet TAKE 1 TABLET(75 MG) BY MOUTH DAILY 04/17/18   Reed, Tiffany L, DO  ondansetron (ZOFRAN) 4 MG tablet Take 1 tablet (4 mg total) by mouth 2 (two) times daily as needed for nausea. 12/10/17   Reed, Tiffany L, DO  Phenylephrine HCl (NEO-SYNEPHRINE NA) Place 1 spray into the nose 2 (two) times daily as needed (congestion).    [provider]  sodium chloride (OCEAN) 0.65 % SOLN nasal spray Place 2 sprays into both nostrils daily as needed for congestion.     [provider]  zolpidem (AMBIEN) 5 MG tablet TAKE 1 TABLET BY MOUTH EVERY NIGHT AT BEDTIME AS NEEDED FOR SLEEP 07/17/18   Gayland Curry, DO    Family History Family History  Problem Relation Age of Onset  . Breast cancer Mother   . Hypertension Mother   . Hypertension Father     Social History Social History   Tobacco Use  . Smoking status: Former Smoker    Packs/day: 1.00    Years: 30.00    Pack years: 30.00    Types: Cigarettes    Last attempt to quit: 10/09/1983    Years since quitting: 34.8  . Smokeless tobacco: Never Used  Substance Use Topics  . Alcohol use: Yes    Alcohol/week: 1.0 - 2.0 standard  drinks    Types: 1 - 2 Shots of liquor per week    Comment: 2 DRINKS A DAY - daily  scotch   . Drug use: No     Allergies   Mirtazapine; Nickel; Other; and Prednisone   Review of Systems Review of Systems 10 Systems reviewed and are negative for acute change except as noted in the HPI.   Physical Exam Updated Vital Signs BP (!) 139/56   Pulse 82  Temp 98.7 F (37.1 C) (Oral)   Resp (!) 21   Ht 5\' 7"  (1.702 m)   Wt 84.4 kg   LMP  (LMP Unknown)   SpO2 94%   BMI 29.13 kg/m   Physical Exam  Constitutional:  Patient is alert and appropriate.  Mild increased work of breathing at rest.  Nontoxic.  HENT:  Head: Normocephalic and atraumatic.  Nose: Nose normal.  Mouth/Throat: Oropharynx is clear and moist.  Eyes: EOM are normal.  Cardiovascular: Normal rate, normal heart sounds and intact distal pulses.  Irregularly irregular.  Pulmonary/Chest:  Mild increased work of breathing.  Occasional cough paroxysmal.  Scattered fine expiratory wheeze.  Occasional crackle.  Abdominal: Soft. She exhibits no distension. There is no tenderness. There is no guarding.  Musculoskeletal: Normal range of motion. She exhibits no edema or tenderness.  Neurological: She is alert. No cranial nerve deficit. She exhibits normal muscle tone. Coordination normal.  Skin: Skin is warm and dry.  Psychiatric: She has a normal mood and affect.     ED Treatments / Results  Labs (all labs ordered are listed, but only abnormal results are displayed) Labs Reviewed  COMPREHENSIVE METABOLIC PANEL - Abnormal; Notable for the following components:      Result Value   Sodium 132 (*)    Chloride 95 (*)    Glucose, Bld 130 (*)    GFR calc non Af Amer 57 (*)    All other components within normal limits  URINALYSIS, ROUTINE W REFLEX MICROSCOPIC - Abnormal; Notable for the following components:   APPearance CLOUDY (*)    Specific Gravity, Urine <1.005 (*)    Hgb urine dipstick SMALL (*)    Nitrite  POSITIVE (*)    Leukocytes, UA TRACE (*)    All other components within normal limits  BRAIN NATRIURETIC PEPTIDE - Abnormal; Notable for the following components:   B Natriuretic Peptide 298.3 (*)    All other components within normal limits  URINALYSIS, MICROSCOPIC (REFLEX) - Abnormal; Notable for the following components:   Bacteria, UA MANY (*)    All other components within normal limits  CULTURE, BLOOD (ROUTINE X 2)  CULTURE, BLOOD (ROUTINE X 2)  URINE CULTURE  CBC WITH DIFFERENTIAL/PLATELET  INFLUENZA PANEL BY PCR (TYPE A & B)  MAGNESIUM  PHOSPHORUS  I-STAT CG4 LACTIC ACID, ED  I-STAT CG4 LACTIC ACID, ED    EKG EKG Interpretation  Date/Time:  Saturday August 08 2018 11:42:54 EST Ventricular Rate:  89 PR Interval:    QRS Duration: 127 QT Interval:  393 QTC Calculation: 479 R Axis:   -62 Text Interpretation:  Sinus or ectopic atrial rhythm Ventricular premature complex Left bundle branch block old LBBB. dynamic inferior T waves. Confirmed by Charlesetta Shanks [phone]) on 08/08/2018 1:26:11 PM   Radiology Dg Chest 2 View  Result Date: 08/08/2018 CLINICAL DATA:  Productive cough. EXAM: CHEST - 2 VIEW COMPARISON:  05/23/2017 FINDINGS: Coarse lung markings appear chronic. No focal airspace disease or pulmonary edema. There appears to be dextroscoliosis in the thoracic spine. Heart and mediastinum are stable. Atherosclerotic calcifications at the aortic arch. No large pleural effusions. IMPRESSION: No active cardiopulmonary disease. Electronically Signed   By: Markus Daft M.D.   On: 08/08/2018 12:48    Procedures Procedures (including critical care time)  Medications Ordered in ED Medications  ipratropium-albuterol (DUONEB) 0.5-2.5 (3) MG/3ML nebulizer solution 3 mL (has no administration in time range)  albuterol (PROVENTIL) (2.5 MG/3ML) 0.083% nebulizer solution (2.5 mg  Given  08/08/18 1047)  ipratropium-albuterol (DUONEB) 0.5-2.5 (3) MG/3ML nebulizer solution (3 mLs   Given 08/08/18 1047)     Initial Impression / Assessment and Plan / ED Course  I have reviewed the triage vital signs and the nursing notes.  Pertinent labs & imaging results that were available during my care of the patient were reviewed by me and considered in my medical decision making (see chart for details).  Clinical Course as of Aug 08 1352  Sat Aug 08, 2018  1350 Consult: Reviewed Dr. Olevia Bowens for admission.   [MP]    Clinical Course User Index [MP] Charlesetta Shanks, MD   Patient presents with fever reportedly up to 102.  She has had 3 days of cough.  She reports the cough is been productive of thick yellow sputum.  Patient reports her upper abdomen aches from coughing.  No vomiting no diarrhea.  Patient is hypoxic to upper 80s on room air.  She does not typically wear home oxygen.  Chest x-ray does not show focal pneumonia.  Patient does not have leukocytosis.  I suspect viral illness.  Influenza is pending at this time.  We will plan to admit for supplemental oxygen, duo nebs and observation.  Blood cultures have been obtained.  Final Clinical Impressions(s) / ED Diagnoses   Final diagnoses:  Hypoxia  Viral pneumonia    ED Discharge Orders    None       Charlesetta Shanks, MD 08/08/18 1356

## 2018-08-09 DIAGNOSIS — K219 Gastro-esophageal reflux disease without esophagitis: Secondary | ICD-10-CM

## 2018-08-09 DIAGNOSIS — I5032 Chronic diastolic (congestive) heart failure: Secondary | ICD-10-CM | POA: Diagnosis not present

## 2018-08-09 DIAGNOSIS — R0902 Hypoxemia: Secondary | ICD-10-CM

## 2018-08-09 DIAGNOSIS — I480 Paroxysmal atrial fibrillation: Secondary | ICD-10-CM

## 2018-08-09 DIAGNOSIS — I10 Essential (primary) hypertension: Secondary | ICD-10-CM | POA: Diagnosis not present

## 2018-08-09 DIAGNOSIS — J129 Viral pneumonia, unspecified: Secondary | ICD-10-CM | POA: Diagnosis not present

## 2018-08-09 LAB — CBC WITH DIFFERENTIAL/PLATELET
Abs Immature Granulocytes: 0.03 10*3/uL (ref 0.00–0.07)
BASOS ABS: 0 10*3/uL (ref 0.0–0.1)
BASOS PCT: 0 %
EOS ABS: 0 10*3/uL (ref 0.0–0.5)
Eosinophils Relative: 1 %
HEMATOCRIT: 38.1 % (ref 36.0–46.0)
Hemoglobin: 12.3 g/dL (ref 12.0–15.0)
IMMATURE GRANULOCYTES: 1 %
LYMPHS ABS: 0.8 10*3/uL (ref 0.7–4.0)
Lymphocytes Relative: 13 %
MCH: 32.2 pg (ref 26.0–34.0)
MCHC: 32.3 g/dL (ref 30.0–36.0)
MCV: 99.7 fL (ref 80.0–100.0)
Monocytes Absolute: 0.8 10*3/uL (ref 0.1–1.0)
Monocytes Relative: 12 %
NEUTROS PCT: 73 %
NRBC: 0 % (ref 0.0–0.2)
Neutro Abs: 4.6 10*3/uL (ref 1.7–7.7)
PLATELETS: 179 10*3/uL (ref 150–400)
RBC: 3.82 MIL/uL — ABNORMAL LOW (ref 3.87–5.11)
RDW: 14.1 % (ref 11.5–15.5)
WBC: 6.2 10*3/uL (ref 4.0–10.5)

## 2018-08-09 LAB — GLUCOSE, CAPILLARY
GLUCOSE-CAPILLARY: 100 mg/dL — AB (ref 70–99)
GLUCOSE-CAPILLARY: 146 mg/dL — AB (ref 70–99)
Glucose-Capillary: 123 mg/dL — ABNORMAL HIGH (ref 70–99)
Glucose-Capillary: 136 mg/dL — ABNORMAL HIGH (ref 70–99)

## 2018-08-09 LAB — COMPREHENSIVE METABOLIC PANEL
ALBUMIN: 3.3 g/dL — AB (ref 3.5–5.0)
ALT: 13 U/L (ref 0–44)
ANION GAP: 10 (ref 5–15)
AST: 19 U/L (ref 15–41)
Alkaline Phosphatase: 65 U/L (ref 38–126)
BILIRUBIN TOTAL: 0.9 mg/dL (ref 0.3–1.2)
BUN: 16 mg/dL (ref 8–23)
CO2: 28 mmol/L (ref 22–32)
Calcium: 8.7 mg/dL — ABNORMAL LOW (ref 8.9–10.3)
Chloride: 96 mmol/L — ABNORMAL LOW (ref 98–111)
Creatinine, Ser: 0.83 mg/dL (ref 0.44–1.00)
GFR calc Af Amer: >60 mL/min (ref >60)
GLUCOSE: 110 mg/dL — AB (ref 70–99)
POTASSIUM: 4.3 mmol/L (ref 3.5–5.1)
Sodium: 134 mmol/L — ABNORMAL LOW (ref 135–145)
TOTAL PROTEIN: 5.9 g/dL — AB (ref 6.5–8.1)

## 2018-08-09 MED ORDER — AZITHROMYCIN 250 MG PO TABS
250.0000 mg | ORAL_TABLET | Freq: Every day | ORAL | Status: DC
  Administered 2018-08-10 – 2018-08-11 (×2): 250 mg via ORAL
  Filled 2018-08-09 (×2): qty 1

## 2018-08-09 MED ORDER — ALPRAZOLAM 0.25 MG PO TABS: 0.2500 mg | ORAL_TABLET | Freq: Four times a day (QID) | ORAL | Status: DC | PRN

## 2018-08-09 MED ORDER — AZITHROMYCIN 250 MG PO TABS
500.0000 mg | ORAL_TABLET | Freq: Every day | ORAL | Status: AC
  Administered 2018-08-09: 500 mg via ORAL
  Filled 2018-08-09: qty 2

## 2018-08-09 NOTE — Plan of Care (Signed)
Patient in bed this morning. States she had a good night; no complaints of pain at this time. Will continue to monitor.

## 2018-08-09 NOTE — Progress Notes (Signed)
Progress Note    [name]  CNO:[id number] DOB: 1933/04/13  DOA: 08/08/2018 PCP: Gayland Curry, DO    Brief Narrative:   Chief complaint: Follow-up upper respiratory symptoms associated with fever.  Medical records reviewed and are as summarized below:  [name] is an 84 y.o. female with a PMH of PAF, osteoarthritis, bilateral cataracts, depression, diverticulosis, history of GI bleeding due to peptic ulcer disease, hypertension, insomnia, migraine headaches, obesity, peripheral neuropathy, and UTI who was admitted 08/08/2018 for evaluation of cough associated with fever of 102 F associated with left-sided chest pain in the setting of intense coughing.  Upon initial evaluation in the ED, afebrile with oxygen saturation of 91% on room air, 30 UA, sodium 132, BNP 298.3 with a normal lactic acid.  Chest x-ray negative for pneumonia.  Assessment/Plan:   Principal Problem:   Viral versus atypical bacterial pneumonia versus viral bronchitis. Chest x-ray personally reviewed and does not show any evidence of pneumonia.  Placed on supplemental oxygen, bronchodilators, Pulmicort, Mucinex, and hydrocodone for persistent cough.  Influenza negative.  Placed on Levaquin for atypical pneumonia.  Afebrile now.  Will narrow antibiotics to Azithromycin.  No evidence of UTI. D/C respiratory virus panel, not needed and will not change treatment plan.  Active Problems:   Essential hypertension Continue Bystolic, Avapro.    Paroxysmal atrial fibrillation (HCC) CHA2DS2-VASc Score of at least 6.  Continue Tambocor, Bystolic, and apixaban.    Gastroesophageal reflux disease Continue famotidine.    Chronic diastolic heart failure ([facility]) Compensated.  Body mass index is 29.13 kg/m.   Family Communication/Anticipated D/C date and plan/Code Status   DVT prophylaxis: On chronic Eliquis. Code Status: DNR.  Family Communication: Daughter at bedside. Disposition Plan: Anticipate d/c home  in a.m.   Medical Consultants:    None.   Anti-Infectives:    Levaquin 08/08/2018---> 08/09/2018  Azithromycin 08/09/2018--->  Subjective:   [name] continues to have severe cough associated with pale yellow sputum and abdominal/chest discomfort secondary to cough. +Dyspnea/wheeze.    Objective:    Vitals:   08/08/18 2120 08/09/18 0627 08/09/18 0755 08/09/18 0803  BP: (!) 184/80 133/72    Pulse: 88 70    Resp: 18 18    Temp: 98.4 F (36.9 C) 97.7 F (36.5 C)    TempSrc: Oral Oral    SpO2: 96% 99% 95% 95%  Weight:      Height:        Intake/Output Summary (Last 24 hours) at 08/09/2018 0840 Last data filed at 08/09/2018 2947 Gross per 24 hour  Intake 780 ml  Output 1000 ml  Net -220 ml   Filed Weights   08/08/18 1035  Weight: 84.4 kg    Exam: General: No acute distress. Cardiovascular: Heart sounds show a regular rate, and rhythm. No gallops or rubs. II/VI systolic murmur. No JVD. Lungs: Course with bilateral wheezes and rhonchi. Abdomen: Soft, nontender, nondistended with normal active bowel sounds. No masses. No hepatosplenomegaly. Neurological: Alert and oriented 3. Moves all extremities 4 with equal strength. Cranial nerves II through XII grossly intact. Skin: Warm and dry. Scattered ecchymosis. Extremities: No clubbing or cyanosis. No edema. Pedal pulses 2+. Psychiatric: Mood and affect are normal. Insight and judgment are normal.   Data Reviewed:   I have personally reviewed following labs and imaging studies:  Labs: Labs show the following:   Basic Metabolic Panel: Recent Labs  Lab 08/08/18 1158 08/09/18 0413  NA 132* 134*  K 3.7  4.3  CL 95* 96*  CO2 26 28  GLUCOSE 130* 110*  BUN 14 16  CREATININE 0.91 0.83  CALCIUM 8.9 8.7*  MG 1.7  --   PHOS 3.2  --    GFR Estimated Creatinine Clearance: 57.3 mL/min (by C-G formula based on SCr of 0.83 mg/dL). Liver Function Tests: Recent Labs  Lab 08/08/18 1158 08/09/18 0413  AST  24 19  ALT 15 13  ALKPHOS 73 65  BILITOT 1.2 0.9  PROT 6.8 5.9*  ALBUMIN 3.8 3.3*    CBC: Recent Labs  Lab 08/08/18 1158 08/09/18 0413  WBC 7.9 6.2  NEUTROABS 5.6 4.6  HGB 13.6 12.3  HCT 41.4 38.1  MCV 97.2 99.7  PLT 154 179   CBG: Recent Labs  Lab 08/08/18 1658 08/08/18 2116 08/09/18 0742  GLUCAP 135* 119* 100*   Sepsis Labs: Recent Labs  Lab 08/08/18 1158 08/08/18 1211 08/09/18 0413  WBC 7.9  --  6.2  LATICACIDVEN  --  1.53  --     Microbiology Recent Results (from the past 240 hour(s))  Blood Culture (routine x 2)     Status: None (Preliminary result)   Collection Time: 08/08/18 11:50 AM  Result Value Ref Range Status   Specimen Description   Final    BLOOD LEFT FOREARM Performed at [facility], [address] [place], [place] [address]    Special Requests   Final    BOTTLES DRAWN AEROBIC AND ANAEROBIC Blood Culture adequate volume Performed at [facility], [address] [place], [place] [address]    Culture   Final    NO GROWTH <12 HOURS Performed at [facility], [place] [address], [place], [place] [address]    Report Status PENDING  Incomplete  Blood Culture (routine x 2)     Status: None (Preliminary result)   Collection Time: 08/08/18 11:55 AM  Result Value Ref Range Status   Specimen Description   Final    BLOOD RIGHT FOREARM Performed at [facility], [address] [place], [place] [address]    Special Requests   Final    BOTTLES DRAWN AEROBIC AND ANAEROBIC Blood Culture adequate volume Performed at [facility], [address] [place], [place] [address]    Culture   Final    NO GROWTH <12 HOURS Performed at [facility], [place] [address], [place], [place] [address]    Report Status PENDING  Incomplete    Procedures and diagnostic studies:  Dg Chest 2 View  Result Date: 08/08/2018 CLINICAL DATA:  Productive cough. EXAM: CHEST - 2 VIEW COMPARISON:  05/23/2017 FINDINGS:  Coarse lung markings appear chronic. No focal airspace disease or pulmonary edema. There appears to be dextroscoliosis in the thoracic spine. Heart and mediastinum are stable. Atherosclerotic calcifications at the aortic arch. No large pleural effusions. IMPRESSION: No active cardiopulmonary disease. Electronically Signed   By: Markus Daft M.D.   On: 08/08/2018 12:48    Medications:   . allopurinol  100 mg Oral Daily  . apixaban  5 mg Oral BID  . budesonide (PULMICORT) nebulizer solution  0.5 mg Nebulization BID  . dextromethorphan-guaiFENesin  1 tablet Oral BID  . famotidine  20 mg Oral BID  . flecainide  50 mg Oral BID  . ipratropium  0.5 mg Nebulization Q6H  . irbesartan  75 mg Oral Daily  . levalbuterol  1.25 mg Nebulization Q6H  . Melatonin  5 mg Oral QHS  . nebivolol  10 mg Oral Daily  . sodium chloride  2 spray Each Nare BID   Continuous Infusions: . levofloxacin (LEVAQUIN) IV 750 mg (08/08/18 1908)     LOS: 0 days   Jacquelynn Cree  [facility] Pager [phone]. If unable to reach me by pager, please call my cell phone at [phone].  *Please refer to [email or web address], password [email or web address] to get updated schedule on who will round on this patient, as hospitalists switch teams weekly. If 7PM-7AM, please contact night-coverage at [email or web address], password [email or web address] for any overnight needs.  08/09/2018, 8:40 AM

## 2018-08-10 ENCOUNTER — Observation Stay (HOSPITAL_COMMUNITY): Payer: Medicare Other

## 2018-08-10 DIAGNOSIS — J9601 Acute respiratory failure with hypoxia: Secondary | ICD-10-CM | POA: Diagnosis present

## 2018-08-10 DIAGNOSIS — I5032 Chronic diastolic (congestive) heart failure: Secondary | ICD-10-CM | POA: Diagnosis present

## 2018-08-10 DIAGNOSIS — G609 Hereditary and idiopathic neuropathy, unspecified: Secondary | ICD-10-CM | POA: Diagnosis present

## 2018-08-10 DIAGNOSIS — C801 Malignant (primary) neoplasm, unspecified: Secondary | ICD-10-CM

## 2018-08-10 DIAGNOSIS — Z87891 Personal history of nicotine dependence: Secondary | ICD-10-CM | POA: Diagnosis not present

## 2018-08-10 DIAGNOSIS — Z79899 Other long term (current) drug therapy: Secondary | ICD-10-CM | POA: Diagnosis not present

## 2018-08-10 DIAGNOSIS — C787 Secondary malignant neoplasm of liver and intrahepatic bile duct: Secondary | ICD-10-CM | POA: Diagnosis present

## 2018-08-10 DIAGNOSIS — Z888 Allergy status to other drugs, medicaments and biological substances status: Secondary | ICD-10-CM | POA: Diagnosis not present

## 2018-08-10 DIAGNOSIS — G4700 Insomnia, unspecified: Secondary | ICD-10-CM | POA: Diagnosis present

## 2018-08-10 DIAGNOSIS — J129 Viral pneumonia, unspecified: Secondary | ICD-10-CM | POA: Diagnosis present

## 2018-08-10 DIAGNOSIS — I480 Paroxysmal atrial fibrillation: Secondary | ICD-10-CM | POA: Diagnosis not present

## 2018-08-10 DIAGNOSIS — K219 Gastro-esophageal reflux disease without esophagitis: Secondary | ICD-10-CM | POA: Diagnosis present

## 2018-08-10 DIAGNOSIS — J189 Pneumonia, unspecified organism: Secondary | ICD-10-CM | POA: Diagnosis not present

## 2018-08-10 DIAGNOSIS — N390 Urinary tract infection, site not specified: Secondary | ICD-10-CM | POA: Diagnosis present

## 2018-08-10 DIAGNOSIS — R918 Other nonspecific abnormal finding of lung field: Secondary | ICD-10-CM | POA: Diagnosis not present

## 2018-08-10 DIAGNOSIS — I110 Hypertensive heart disease with heart failure: Secondary | ICD-10-CM | POA: Diagnosis present

## 2018-08-10 DIAGNOSIS — Z7901 Long term (current) use of anticoagulants: Secondary | ICD-10-CM | POA: Diagnosis not present

## 2018-08-10 DIAGNOSIS — B961 Klebsiella pneumoniae [K. pneumoniae] as the cause of diseases classified elsewhere: Secondary | ICD-10-CM | POA: Diagnosis present

## 2018-08-10 DIAGNOSIS — I10 Essential (primary) hypertension: Secondary | ICD-10-CM | POA: Diagnosis not present

## 2018-08-10 DIAGNOSIS — Z66 Do not resuscitate: Secondary | ICD-10-CM | POA: Diagnosis present

## 2018-08-10 LAB — CBC WITH DIFFERENTIAL/PLATELET
Abs Immature Granulocytes: 0.03 10*3/uL (ref 0.00–0.07)
BASOS ABS: 0 10*3/uL (ref 0.0–0.1)
BASOS PCT: 0 %
EOS PCT: 1 %
Eosinophils Absolute: 0.1 10*3/uL (ref 0.0–0.5)
HCT: 36.7 % (ref 36.0–46.0)
HEMOGLOBIN: 11.6 g/dL — AB (ref 12.0–15.0)
Immature Granulocytes: 1 %
LYMPHS PCT: 22 %
Lymphs Abs: 1.1 10*3/uL (ref 0.7–4.0)
MCH: 32 pg (ref 26.0–34.0)
MCHC: 31.6 g/dL (ref 30.0–36.0)
MCV: 101.1 fL — ABNORMAL HIGH (ref 80.0–100.0)
MONO ABS: 0.7 10*3/uL (ref 0.1–1.0)
Monocytes Relative: 14 %
NRBC: 0 % (ref 0.0–0.2)
Neutro Abs: 3.2 10*3/uL (ref 1.7–7.7)
Neutrophils Relative %: 62 %
PLATELETS: 173 10*3/uL (ref 150–400)
RBC: 3.63 MIL/uL — AB (ref 3.87–5.11)
RDW: 14.1 % (ref 11.5–15.5)
WBC: 5.1 10*3/uL (ref 4.0–10.5)

## 2018-08-10 LAB — D-DIMER, QUANTITATIVE (NOT AT ARMC): D DIMER QUANT: 0.45 ug{FEU}/mL (ref 0.00–0.50)

## 2018-08-10 LAB — GLUCOSE, CAPILLARY
GLUCOSE-CAPILLARY: 113 mg/dL — AB (ref 70–99)
GLUCOSE-CAPILLARY: 139 mg/dL — AB (ref 70–99)
Glucose-Capillary: 109 mg/dL — ABNORMAL HIGH (ref 70–99)
Glucose-Capillary: 109 mg/dL — ABNORMAL HIGH (ref 70–99)

## 2018-08-10 MED ORDER — LEVALBUTEROL HCL 1.25 MG/0.5ML IN NEBU
1.2500 mg | INHALATION_SOLUTION | Freq: Two times a day (BID) | RESPIRATORY_TRACT | Status: DC
  Administered 2018-08-10 – 2018-08-11 (×2): 1.25 mg via RESPIRATORY_TRACT
  Filled 2018-08-10 (×3): qty 0.5

## 2018-08-10 MED ORDER — SODIUM CHLORIDE 0.9 % IV SOLN
1.0000 g | INTRAVENOUS | Status: DC
  Administered 2018-08-10: 1 g via INTRAVENOUS
  Filled 2018-08-10: qty 10
  Filled 2018-08-10: qty 1

## 2018-08-10 MED ORDER — LEVALBUTEROL HCL 1.25 MG/0.5ML IN NEBU
1.2500 mg | INHALATION_SOLUTION | Freq: Three times a day (TID) | RESPIRATORY_TRACT | Status: DC
  Filled 2018-08-10 (×2): qty 0.5

## 2018-08-10 MED ORDER — IPRATROPIUM BROMIDE 0.02 % IN SOLN: 0.5000 mg | Freq: Three times a day (TID) | RESPIRATORY_TRACT | Status: DC

## 2018-08-10 MED ORDER — IPRATROPIUM BROMIDE 0.02 % IN SOLN
0.5000 mg | Freq: Two times a day (BID) | RESPIRATORY_TRACT | Status: DC
  Administered 2018-08-10 – 2018-08-11 (×2): 0.5 mg via RESPIRATORY_TRACT
  Filled 2018-08-10 (×2): qty 2.5

## 2018-08-10 NOTE — Progress Notes (Addendum)
PROGRESS NOTE  [name] DDU:[id number] DOB: July 20, 1933 DOA: 08/08/2018 PCP: Gayland Curry, DO  HPI/Recap of past 24 hours: HPI from Dr Cordelia Pen is a 84 y.o. female with medical history of paroxysmal atrial fibrillation, osteoarthritis, bilateral cataracts, depression, colonic diverticulosis, history of GI bleed/PUD, hypertension, insomnia, migraine headaches, obesity, peripheral neuropathy, history UTI with no pain who is coming from [facility] due to productive cough of yellowish sputum and fever for the past 3 days.  She has had decreased appetite, fatigue, malaise and had a temperature checked yesterday which was 102 F at home.  She complains of left-sided lower chest wall and upper abdomen tenderness with coughing.  She states that she has had intense coughing episodes at times.  She has had some trouble sleeping.  She denies sick contacts or travel history. In the ED,  VSS, sats 91% on RA. Urinalysis had a cloudy appearance and was positive for nitrates and trace leukocyte esterase.  Only 6-10 WBC, but many bacteria on microscopic examination, sodium of 132, Lactic acid was normal.  Her chest radiograph did not show any acute abnormalities. Pt admitted for further management.   Today, pt reported still having significant cough, with subsequent chest wall and abdominal tenderness while coughing.  Noted to drop her sats to 86% on room air while ambulation.  Patient denies any left-sided chest pain, nausea/vomiting, fever/chills.  Assessment/Plan: Principal Problem:   Viral pneumonia Active Problems:   Essential hypertension   Paroxysmal atrial fibrillation (HCC)   Gastroesophageal reflux disease   Chronic diastolic heart failure (HCC)  Acute hypoxic respiratory failure likely 2/2 viral/atypical PNA versus viral bronchitis Currently afebrile, no leukocytosis Noted to drop her sats to 86% on room air while ambulation, will reevaluate tomorrow morning for temporary home oxygen  needs Influenza panel negative BC x 2 NGTD Lactic acid within normal limits D-dimer negative Chest x-ray unremarkable CT chest without contrast pending Continue azithromycin, will add on IV ceftriaxone, due to urine cultures growing Klebsiella, awaiting susceptibilities Continue oxygen supplement Monitor closely  UTI 2/2 Klebsiella oxytoca/pneumoniae Patient symptomatic on admission UA with positive nitrites, many bacteria, 6-10 WBC UC growing Klebsiella oxytoca/pneumoniae Start IV ceftriaxone pending cultures  Essential hypertension Continue Bystolic, Avapro  Paroxysmal A. Fib Currently rate controlled CHA2DS2-VASc score at least 6 Continue Bystolic, flecainide, apixaban  Chronic diastolic heart failure Compensated  GERD Continue famotidine     Code Status: DNR  Family Communication: None at bedside  Disposition Plan: Plan for DC home likely 08/11/2018   Consultants:  None  Procedures:  None  Antimicrobials:  Azithromycin  Ceftriaxone  DVT prophylaxis: Apixaban   Objective: Vitals:   08/09/18 2133 08/10/18 0650 08/10/18 0804 08/10/18 1433  BP: (!) 180/84 (!) 189/63  (!) 150/69  Pulse: 82 69 75 71  Resp: 18 18 18 17   Temp: (!) 97.5 F (36.4 C) 98.6 F (37 C)  99.2 F (37.3 C)  TempSrc: Oral Oral  Oral  SpO2: 100% 97% 92% 97%  Weight:      Height:        Intake/Output Summary (Last 24 hours) at 08/10/2018 1531 Last data filed at 08/10/2018 1400 Gross per 24 hour  Intake 485 ml  Output 1125 ml  Net -640 ml   Filed Weights   08/08/18 1035  Weight: 84.4 kg    Exam:   General: NAD  Cardiovascular: S1, S2 present  Respiratory: Coarse breath sounds bilaterally with some mild wheezing  Abdomen: Soft, nontender, nondistended, bowel sounds  present  Musculoskeletal: No pedal edema bilaterally  Skin: Scattered ecchymosis, otherwise normal  Psychiatry: Normal mood   Data Reviewed: CBC: Recent Labs  Lab 08/08/18 1158  08/09/18 0413 08/10/18 0415  WBC 7.9 6.2 5.1  NEUTROABS 5.6 4.6 3.2  HGB 13.6 12.3 11.6*  HCT 41.4 38.1 36.7  MCV 97.2 99.7 101.1*  PLT 154 179 093   Basic Metabolic Panel: Recent Labs  Lab 08/08/18 1158 08/09/18 0413  NA 132* 134*  K 3.7 4.3  CL 95* 96*  CO2 26 28  GLUCOSE 130* 110*  BUN 14 16  CREATININE 0.91 0.83  CALCIUM 8.9 8.7*  MG 1.7  --   PHOS 3.2  --    GFR: Estimated Creatinine Clearance: 57.3 mL/min (by C-G formula based on SCr of 0.83 mg/dL). Liver Function Tests: Recent Labs  Lab 08/08/18 1158 08/09/18 0413  AST 24 19  ALT 15 13  ALKPHOS 73 65  BILITOT 1.2 0.9  PROT 6.8 5.9*  ALBUMIN 3.8 3.3*   No results for input(s): LIPASE, AMYLASE in the last 168 hours. No results for input(s): AMMONIA in the last 168 hours. Coagulation Profile: No results for input(s): INR, PROTIME in the last 168 hours. Cardiac Enzymes: No results for input(s): CKTOTAL, CKMB, CKMBINDEX, TROPONINI in the last 168 hours. BNP (last 3 results) No results for input(s): PROBNP in the last 8760 hours. HbA1C: No results for input(s): HGBA1C in the last 72 hours. CBG: Recent Labs  Lab 08/09/18 1306 08/09/18 1638 08/09/18 2140 08/10/18 0856 08/10/18 1256  GLUCAP 146* 123* 136* 109* 113*   Lipid Profile: No results for input(s): CHOL, HDL, LDLCALC, TRIG, CHOLHDL, LDLDIRECT in the last 72 hours. Thyroid Function Tests: No results for input(s): TSH, T4TOTAL, FREET4, T3FREE, THYROIDAB in the last 72 hours. Anemia Panel: No results for input(s): VITAMINB12, FOLATE, FERRITIN, TIBC, IRON, RETICCTPCT in the last 72 hours. Urine analysis:    Component Value Date/Time   COLORURINE YELLOW 08/08/2018 1159   APPEARANCEUR CLOUDY (A) 08/08/2018 1159   LABSPEC <1.005 (L) 08/08/2018 1159   PHURINE 6.0 08/08/2018 1159   GLUCOSEU NEGATIVE 08/08/2018 1159   HGBUR SMALL (A) 08/08/2018 1159   BILIRUBINUR NEGATIVE 08/08/2018 1159   BILIRUBINUR Neg 09/08/2017 1557   KETONESUR NEGATIVE  08/08/2018 1159   PROTEINUR NEGATIVE 08/08/2018 1159   UROBILINOGEN 0.2 09/08/2017 1557   UROBILINOGEN 0.2 12/02/2014 1242   NITRITE POSITIVE (A) 08/08/2018 1159   LEUKOCYTESUR TRACE (A) 08/08/2018 1159   Sepsis Labs: @LABRCNTIP (procalcitonin:4,lacticidven:4)  ) Recent Results (from the past 240 hour(s))  Blood Culture (routine x 2)     Status: None (Preliminary result)   Collection Time: 08/08/18 11:50 AM  Result Value Ref Range Status   Specimen Description   Final    BLOOD LEFT FOREARM Performed at [facility], [address] [place], [place] [address]    Special Requests   Final    BOTTLES DRAWN AEROBIC AND ANAEROBIC Blood Culture adequate volume Performed at [facility], [address] [place], [place] [address]    Culture   Final    NO GROWTH 2 DAYS Performed at [facility], [place] [address] [place], [place] [address]    Report Status PENDING  Incomplete  Blood Culture (routine x 2)     Status: None (Preliminary result)   Collection Time: 08/08/18 11:55 AM  Result Value Ref Range Status   Specimen Description   Final    BLOOD RIGHT FOREARM Performed at  [place], [address] [place], [place] [address]    Special Requests   Final    BOTTLES DRAWN AEROBIC AND ANAEROBIC Blood Culture adequate volume Performed at [facility], [address] [place], [place] [address]    Culture   Final    NO GROWTH 2 DAYS Performed at [facility], [place] [address], [place], [place] [address]    Report Status PENDING  Incomplete  Urine culture     Status: Abnormal (Preliminary result)   Collection Time: 08/08/18 11:59 AM  Result Value Ref Range Status   Specimen Description   Final    URINE, RANDOM Performed at [facility], [address] [place], [place] [address]    Special Requests   Final    NONE Performed at [facility], [address] [place], [place] [address]    Culture (A)   Final    >=100,000 COLONIES/mL KLEBSIELLA OXYTOCA >=100,000 COLONIES/mL KLEBSIELLA PNEUMONIAE SUSCEPTIBILITIES TO FOLLOW Performed at [facility], [place] [address], [place], [place] [address]    Report Status PENDING  Incomplete      Studies: No results found.  Scheduled Meds: . allopurinol  100 mg Oral Daily  . apixaban  5 mg Oral BID  . azithromycin  250 mg Oral Daily  . budesonide (PULMICORT) nebulizer solution  0.5 mg Nebulization BID  . dextromethorphan-guaiFENesin  1 tablet Oral BID  . famotidine  20 mg Oral BID  . flecainide  50 mg Oral BID  . ipratropium  0.5 mg Nebulization BID  . irbesartan  75 mg Oral Daily  . levalbuterol  1.25 mg Nebulization BID  . Melatonin  5 mg Oral QHS  . nebivolol  10 mg Oral Daily  . sodium chloride  2 spray Each Nare BID    Continuous Infusions:   LOS: 0 days     Martha Friendly, MD [facility]  If 7PM-7AM, please contact night-coverage [email or web address] 08/10/2018, 3:31 PM

## 2018-08-10 NOTE — Care Management Obs Status (Signed)
[place] NOTIFICATION   Patient Details  Name: [name] MRN: [id number] Date of Birth: 1932-12-12   Medicare Observation Status Notification Given:  Yes    Guadalupe Maple, RN 08/10/2018, 11:20 AM

## 2018-08-11 DIAGNOSIS — J189 Pneumonia, unspecified organism: Secondary | ICD-10-CM

## 2018-08-11 DIAGNOSIS — N390 Urinary tract infection, site not specified: Secondary | ICD-10-CM

## 2018-08-11 LAB — URINE CULTURE

## 2018-08-11 LAB — GLUCOSE, CAPILLARY
GLUCOSE-CAPILLARY: 108 mg/dL — AB (ref 70–99)
Glucose-Capillary: 104 mg/dL — ABNORMAL HIGH (ref 70–99)

## 2018-08-11 LAB — LEGIONELLA PNEUMOPHILA SEROGP 1 UR AG: L. pneumophila Serogp 1 Ur Ag: NEGATIVE

## 2018-08-11 MED ORDER — AZITHROMYCIN 250 MG PO TABS: 250.0000 mg | ORAL_TABLET | Freq: Every day | ORAL | 0 refills | Status: AC

## 2018-08-11 MED ORDER — CEFPODOXIME PROXETIL 200 MG PO TABS: 200.0000 mg | ORAL_TABLET | Freq: Two times a day (BID) | ORAL | 0 refills | Status: AC

## 2018-08-11 MED ORDER — ALBUTEROL SULFATE HFA 108 (90 BASE) MCG/ACT IN AERS: 2.0000 | INHALATION_SPRAY | Freq: Four times a day (QID) | RESPIRATORY_TRACT | 0 refills | Status: DC | PRN

## 2018-08-11 NOTE — Progress Notes (Signed)
Pt ambulated 300 feet and oxygen levels ranged from 92-97%.

## 2018-08-11 NOTE — Discharge Summary (Signed)
Discharge Summary  [name] QQI:[id number] DOB: 29-Dec-1932  PCP: Martha Curry, DO  Admit date: 08/08/2018 Discharge date: 08/11/2018  Time spent: 35 mins  Recommendations for Outpatient Follow-up:  1. PCP  Discharge Diagnoses:  Active Hospital Problems   Diagnosis Date Noted  . Viral pneumonia 08/08/2018  . Metastatic squamous cell carcinoma involving liver with unknown primary site ([place]) 08/10/2018  . Chronic diastolic heart failure ([place]) 12/10/2017  . Gastroesophageal reflux disease 04/29/2017  . Paroxysmal atrial fibrillation ([facility])   . Essential hypertension 10/08/2012    Resolved Hospital Problems  No resolved problems to display.    Discharge Condition: Stable  Diet recommendation: Heart healthy  Vitals:   08/11/18 0741 08/11/18 1011  BP:  113/74  Pulse: 68 86  Resp: 17   Temp:    SpO2: 96% 92%    History of present illness:  Srihitha D Haileyis a 84 y.o.femalewith medical historyof paroxysmal atrial fibrillation, osteoarthritis, bilateral cataracts, depression, colonic diverticulosis, history of GI bleed/PUD, hypertension, insomnia, migraine headaches, obesity, peripheral neuropathy, history [facility] to[facility] cough of yellowish sputum and fever for the past 3 days.She has had decreased appetite, fatigue, malaise and had a temperature checked yesterday which was 102 F at home. She complains of left-sided lower chest wall and upper abdomen tenderness with coughing. She states that she has had intense coughing episodes at times. She has had some trouble sleeping. She denies sick contacts or travel history. In the ED,  VSS, sats 91% on RA. Urinalysis had a cloudy appearance and was positive for nitrates and trace leukocyte esterase. Only 6-10 WBC, but many bacteria on microscopic examination, sodium of 132, Lactic acid was normal. Her chest radiograph did not show any acute abnormalities. Pt admitted for further  management.   Today, patient reported feeling much better, reports she is still coughing, advised to take a while for things to settle.  Denies any left-sided chest pain, worsening shortness of breath, fever/chills, abdominal pain.  Patient was able to ambulate for 300 feet, saturating 92 to 97% on room air. Pt very eager to be discharged.  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 Favor multifocal infectious/inflammatory process. Solid nodule in the left lower lobe measures 8 mm. Following appropriate antibiotic therapy advise follow-up imaging with non-contrast chest CT at 3-6 months as well as to follow up on nodules Will d/c on Cefpodoxime and Azithromycin for a total of 5 days Albuterol inhaler prn, pt stated she already has mucinex at home Follow up with PCP  UTI 2/2 Klebsiella oxytoca/pneumoniae Patient symptomatic on admission UA with positive nitrites, many bacteria, 6-10 WBC UC growing Klebsiella oxytoca/pneumoniae S/p 1 dose of ceftriaxone, switch to cefpodoxime for a total of 5 days of AB  Essential hypertension Continue Bystolic, Avapro  Paroxysmal A. Fib Currently rate controlled CHA2DS2-VASc score at least 6 Continue Bystolic, flecainide, apixaban  Chronic diastolic heart  failure Compensated  GERD Continue famotidine   Procedures:  None  Consultations:  None  Discharge Exam: BP 113/74 (BP Location: Left Arm)   Pulse 86   Temp 98.5 F (36.9 C) (Oral)   Resp 17   Ht 5\' 7"  (1.702 m)   Wt 84.4 kg   LMP  (LMP Unknown)   SpO2 92%   BMI 29.13 kg/m   General: NAD Cardiovascular: S1,S2 present  Respiratory: Diminished breath sound b/l  Discharge Instructions You were cared for by a hospitalist during your hospital stay. If you have any questions about your discharge medications or the care you received while you were in the hospital after you are discharged, you can call the unit and asked to speak with the hospitalist on call if the hospitalist that took care of you is not available. Once you are discharged, your primary care physician will handle any further medical issues. Please note that NO REFILLS for any discharge medications will be authorized once you are discharged, as it is imperative that you return to your primary care physician (or establish a relationship with a primary care physician if you do not have one) for your aftercare needs so that they can reassess your need for medications and monitor your lab values.   Allergies as of 08/11/2018      Reactions   Mirtazapine    tremor   Nickel Other (See Comments)   inflammation   Other Itching   Acrylic nails, and trace metals   Prednisone    Causes sleep disturbances       Medication List    TAKE these medications   acetaminophen 325 MG tablet Commonly known as:  TYLENOL Take 750-1,000 mg by mouth See admin instructions. Take 1000 mg in the morning and take 750 mg at night   albuterol 108 (90 Base) MCG/ACT inhaler Commonly known as:  PROVENTIL HFA;VENTOLIN HFA Inhale 2 puffs into the lungs every 6 (six) hours as needed for wheezing or shortness of breath.   allopurinol 100 MG tablet Commonly known as:  ZYLOPRIM Take 1 tablet (100 mg total) by mouth daily.   apixaban 5  MG Tabs tablet Commonly known as:  ELIQUIS TAKE 5 MG BY MOUTH TWICE DAILY   azithromycin 250 MG tablet Commonly known as:  ZITHROMAX Take 1 tablet (250 mg total) by mouth daily for 2 days.   BYSTOLIC 10 MG tablet Generic drug:  nebivolol TAKE 1 TABLET BY MOUTH DAILY   cefpodoxime 200 MG tablet Commonly known as:  VANTIN Take 1 tablet (200 mg total) by mouth 2 (two) times daily for 4 days.   chlorthalidone 25 MG tablet Commonly known as:  HYGROTON Take 25 mg by mouth daily as needed. (SBP >180 mmHg)   famotidine 20 MG tablet Commonly known as:  PEPCID Take 1 tablet (20 mg total) by mouth at bedtime.   flecainide 50 MG tablet Commonly known as:  TAMBOCOR TAKE 1 TABLET BY MOUTH TWICE DAILY FOR ATRIAL FIBRILLATION What changed:  See the new instructions.   furosemide 20 MG tablet Commonly known as:  LASIX Take 1 tablet (20 mg total) by mouth as needed. What changed:  reasons to take this   HYDROcodone-acetaminophen 5-325 MG tablet Commonly known as:  NORCO/VICODIN Take 1 tablet by mouth every 6 (six) hours as needed for moderate pain. One to two tabs every 4-6 hours for pain   irbesartan 75 MG tablet Commonly known as:  AVAPRO TAKE 1 TABLET(75 MG) BY MOUTH DAILY What changed:  See the new instructions.   Melatonin 5 MG Tabs Take 5 mg by mouth at bedtime.   NEO-SYNEPHRINE NA Place 1 spray into the nose 2 (two) times daily as needed (congestion).   ondansetron 4 MG tablet Commonly known as:  ZOFRAN Take 1 tablet (4 mg total) by mouth 2 (two) times daily as needed for nausea.   sodium chloride 0.65 % Soln nasal spray Commonly known as:  OCEAN Place 2 sprays into both nostrils 2 (two) times daily.   zolpidem 5 MG tablet Commonly known as:  AMBIEN TAKE 1 TABLET BY MOUTH EVERY NIGHT AT BEDTIME AS NEEDED FOR SLEEP      Allergies  Allergen Reactions  . Mirtazapine     tremor  . Nickel Other (See Comments)    inflammation  . Other Itching    Acrylic nails, and  trace metals  . Prednisone     Causes sleep disturbances    Follow-up Information    Reed, Tiffany L, DO. Schedule an appointment as soon as possible for a visit in 1 week(s).   Specialty:  Geriatric Medicine Contact information: [address]. [place] [address] [phone]            The results of significant diagnostics from this hospitalization (including imaging, microbiology, ancillary and laboratory) are listed below for reference.    Significant Diagnostic Studies: Dg Chest 2 View  Result Date: 08/08/2018 CLINICAL DATA:  Productive cough. EXAM: CHEST - 2 VIEW COMPARISON:  05/23/2017 FINDINGS: Coarse lung markings appear chronic. No focal airspace disease or pulmonary edema. There appears to be dextroscoliosis in the thoracic spine. Heart and mediastinum are stable. Atherosclerotic calcifications at the aortic arch. No large pleural effusions. IMPRESSION: No active cardiopulmonary disease. Electronically Signed   By: Markus Daft M.D.   On: 08/08/2018 12:48   Ct Chest Wo Contrast  Result Date: 08/10/2018 CLINICAL DATA:  Dyspnea.  Chronic cough and chest pain EXAM: CT CHEST WITHOUT CONTRAST TECHNIQUE: Multidetector CT imaging of the chest was performed following the standard protocol without IV contrast. COMPARISON:  None. FINDINGS: Cardiovascular: There is mild cardiac enlargement. No pericardial effusion. Aortic atherosclerosis. There is increase caliber of the ascending thoracic aorta measuring 4.4 cm in maximum dimension. Calcifications in the LAD coronary artery noted. Mediastinum/Nodes: Normal appearance of the thyroid gland. The trachea appears patent and is midline. Normal appearance of the esophagus. No supraclavicular, axillary, mediastinal adenopathy. Lungs/Pleura: No pleural effusions. Mild to moderate changes of emphysema. No airspace consolidation or pneumothorax. Areas of subsegmental atelectasis are noted in both lower lobes and lingula. Bilateral upper lung zone  predominant peribronchial ground-glass attenuation identified. There is a round solid nodule within the left lower lobe measuring 8 mm, image 87/7. Upper Abdomen: The adrenal glands appear normal. Status post cholecystectomy. No acute findings noted within the visualized portions of the upper abdomen. Musculoskeletal: Spondylosis identified within the thoracic spine. No aggressive lytic or sclerotic bone lesions identified. IMPRESSION: 1. Bilateral upper lobe predominant peribronchial ground-glass opacities are identified along with scattered areas of subsegmental atelectasis. Favor multifocal infectious/inflammatory process. Following appropriate antibiotic therapy advise follow-up imaging with non-contrast chest CT at 3-6 months. If opacities persist, subsequent management will be based upon the most suspicious nodule(s). This recommendation follows the consensus statement: Guidelines for Management of Incidental Pulmonary Nodules Detected on CT Images: From the Fleischner Society 2017; Radiology 2017; 284:228-243. 2. Solid nodule in the left lower lobe measures 8 mm. Attention to this nodule on follow-up imaging is recommended. 3.  Aortic Atherosclerosis (ICD10-I70.0). 4. Aneurysmal dilatation of the ascending thoracic aorta. Recommend annual imaging followup by CTA or MRA. This recommendation follows 2010 ACCF/AHA/AATS/ACR/ASA/SCA/SCAI/SIR/STS/SVM Guidelines for the Diagnosis and Management of Patients with Thoracic Aortic Disease. Circulation. 2010; 121: Q008-Q761 Electronically Signed   By: Kerby Moors M.D.   On: 08/10/2018 17:53    Microbiology: Recent Results (from the past 240 hour(s))  Blood Culture (routine x 2)     Status: None (Preliminary result)   Collection Time: 08/08/18 11:50 AM  Result Value Ref Range Status   Specimen Description   Final    BLOOD LEFT FOREARM Performed at [facility], [address] [place], [place] [address]    Special Requests   Final    BOTTLES  DRAWN AEROBIC AND ANAEROBIC Blood Culture adequate volume Performed at [facility], [address] [place], [place] [address]    Culture   Final    NO GROWTH 2 DAYS Performed at [facility], [place] [address] [place], [place] [address]    Report Status PENDING  Incomplete  Blood Culture (routine x 2)     Status: None (Preliminary result)   Collection Time: 08/08/18 11:55 AM  Result Value Ref Range Status   Specimen Description   Final    BLOOD RIGHT FOREARM Performed at [facility], [address] [place], [place] [address]    Special Requests   Final    BOTTLES DRAWN AEROBIC AND ANAEROBIC Blood Culture adequate volume Performed at [facility], [address] [place], [place] [address]    Culture   Final    NO GROWTH 2 DAYS Performed at [facility], [place] [address] [place], [place] [address]    Report Status PENDING  Incomplete  Urine culture     Status: Abnormal   Collection Time: 08/08/18 11:59 AM  Result Value Ref Range Status   Specimen Description   Final    URINE, RANDOM Performed at [facility], [address] [place], [place] [address]    Special Requests   Final    NONE Performed at [facility], [address] [place], [place] [address]    Culture (A)  Final    >=100,000 COLONIES/mL KLEBSIELLA OXYTOCA >=100,000 COLONIES/mL KLEBSIELLA PNEUMONIAE    Report Status 08/11/2018 FINAL  Final   Organism ID, Bacteria KLEBSIELLA OXYTOCA (A)  Final   Organism ID, Bacteria KLEBSIELLA PNEUMONIAE (A)  Final      Susceptibility   Klebsiella oxytoca - MIC*    AMPICILLIN RESISTANT Resistant     CEFAZOLIN 16 SENSITIVE Sensitive     CEFTRIAXONE <=1 SENSITIVE Sensitive     CIPROFLOXACIN <=0.25 SENSITIVE Sensitive     GENTAMICIN <=1 SENSITIVE Sensitive     IMIPENEM <=0.25 SENSITIVE Sensitive     NITROFURANTOIN <=16 SENSITIVE Sensitive     TRIMETH/SULFA <=20 SENSITIVE Sensitive     AMPICILLIN/SULBACTAM  4 SENSITIVE Sensitive     PIP/TAZO <=4 SENSITIVE Sensitive     Extended ESBL NEGATIVE Sensitive     * >=100,000 COLONIES/mL KLEBSIELLA OXYTOCA   Klebsiella pneumoniae - MIC*    AMPICILLIN RESISTANT Resistant     CEFAZOLIN <=4 SENSITIVE Sensitive     CEFTRIAXONE <=1 SENSITIVE Sensitive     CIPROFLOXACIN <=0.25 SENSITIVE Sensitive     GENTAMICIN <=1 SENSITIVE Sensitive     IMIPENEM <=0.25 SENSITIVE Sensitive     NITROFURANTOIN <=16 SENSITIVE  Sensitive     TRIMETH/SULFA <=20 SENSITIVE Sensitive     AMPICILLIN/SULBACTAM <=2 SENSITIVE Sensitive     PIP/TAZO <=4 SENSITIVE Sensitive     Extended ESBL NEGATIVE Sensitive     * >=100,000 COLONIES/mL KLEBSIELLA PNEUMONIAE     Labs: Basic Metabolic Panel: Recent Labs  Lab 08/08/18 1158 08/09/18 0413  NA 132* 134*  K 3.7 4.3  CL 95* 96*  CO2 26 28  GLUCOSE 130* 110*  BUN 14 16  CREATININE 0.91 0.83  CALCIUM 8.9 8.7*  MG 1.7  --   PHOS 3.2  --    Liver Function Tests: Recent Labs  Lab 08/08/18 1158 08/09/18 0413  AST 24 19  ALT 15 13  ALKPHOS 73 65  BILITOT 1.2 0.9  PROT 6.8 5.9*  ALBUMIN 3.8 3.3*   No results for input(s): LIPASE, AMYLASE in the last 168 hours. No results for input(s): AMMONIA in the last 168 hours. CBC: Recent Labs  Lab 08/08/18 1158 08/09/18 0413 08/10/18 0415  WBC 7.9 6.2 5.1  NEUTROABS 5.6 4.6 3.2  HGB 13.6 12.3 11.6*  HCT 41.4 38.1 36.7  MCV 97.2 99.7 101.1*  PLT 154 179 173   Cardiac Enzymes: No results for input(s): CKTOTAL, CKMB, CKMBINDEX, TROPONINI in the last 168 hours. BNP: BNP (last 3 results) Recent Labs    08/08/18 1158  BNP 298.3*    ProBNP (last 3 results) No results for input(s): PROBNP in the last 8760 hours.  CBG: Recent Labs  Lab 08/10/18 1256 08/10/18 1710 08/10/18 2121 08/11/18 0741 08/11/18 1212  GLUCAP 113* 109* 139* 108* 104*       Signed:  Alma Friendly, MD [facility] 08/11/2018, 1:55 PM

## 2018-08-11 NOTE — Progress Notes (Signed)
Pt alert and oriented.  Tolerating diet. Oxygen saturation was good on room air.  Pt d/cd to [facility].

## 2018-08-12 ENCOUNTER — Telehealth: Payer: Self-pay

## 2018-08-12 NOTE — Telephone Encounter (Signed)
Transition Care Management Follow-up Telephone Call  Date of discharge and from where: [facility] on 08/11/2018  How have you been since you were released from the hospital? Coughing all of last night. Some times coughs up white phlegm   Any questions or concerns? Yes , chronic nausea, frequent h/a, knee pain, tremors  Items Reviewed:  Did the pt receive and understand the discharge instructions provided? Yes   Medications obtained and verified? Yes , picked up antibiotic  Any new allergies since your discharge? No   Dietary orders reviewed? Yes  Do you have support at home? Yes , caregiver 6 days a week  Other (ie: DME, Home Health, etc) n/a  Functional Questionnaire: (I = Independent and D = Dependent) ADL's: I  Bathing/Dressing- I. Standby for bathing   Meal Prep- I  Eating- I  Maintaining continence- I  Transferring/Ambulation- I w/ assist of walker  Managing Meds- I   Follow up appointments reviewed:    PCP Hospital f/u appt confirmed? Yes  Scheduled to see Dr. Mariea Clonts on 08/26/2018 @ 11:30am.  [facility] f/u appt confirmed? N/A  Are transportation arrangements needed? No   If their condition worsens, is the pt aware to call  their PCP or go to the ED? Yes  Was the patient provided with contact information for the PCP's office or ED? Yes  Was the pt encouraged to call back with questions or concerns? Yes

## 2018-08-12 NOTE — Telephone Encounter (Signed)
error 

## 2018-08-13 LAB — CULTURE, BLOOD (ROUTINE X 2)
CULTURE: NO GROWTH
Culture: NO GROWTH
SPECIAL REQUESTS: ADEQUATE
Special Requests: ADEQUATE

## 2018-08-18 ENCOUNTER — Other Ambulatory Visit: Payer: Self-pay | Admitting: *Deleted

## 2018-08-18 MED ORDER — ZOLPIDEM TARTRATE 5 MG PO TABS: 5.0000 mg | ORAL_TABLET | Freq: Every evening | ORAL | 0 refills | Status: DC | PRN

## 2018-08-18 NOTE — Telephone Encounter (Signed)
Database checked and verified Last filled10/25/19

## 2018-08-26 ENCOUNTER — Non-Acute Institutional Stay: Payer: Medicare Other | Admitting: Internal Medicine

## 2018-08-26 ENCOUNTER — Encounter: Payer: Self-pay | Admitting: Internal Medicine

## 2018-08-26 VITALS — BP 132/80 | HR 115 | Temp 97.8°F | Ht 67.0 in | Wt 180.0 lb

## 2018-08-26 DIAGNOSIS — I712 Thoracic aortic aneurysm, without rupture, unspecified: Secondary | ICD-10-CM

## 2018-08-26 DIAGNOSIS — I4891 Unspecified atrial fibrillation: Secondary | ICD-10-CM | POA: Diagnosis not present

## 2018-08-26 DIAGNOSIS — I700 Atherosclerosis of aorta: Secondary | ICD-10-CM

## 2018-08-26 DIAGNOSIS — R110 Nausea: Secondary | ICD-10-CM | POA: Diagnosis not present

## 2018-08-26 DIAGNOSIS — R911 Solitary pulmonary nodule: Secondary | ICD-10-CM

## 2018-08-26 DIAGNOSIS — R918 Other nonspecific abnormal finding of lung field: Secondary | ICD-10-CM

## 2018-08-26 DIAGNOSIS — F410 Panic disorder [episodic paroxysmal anxiety] without agoraphobia: Secondary | ICD-10-CM | POA: Diagnosis not present

## 2018-08-26 DIAGNOSIS — I10 Essential (primary) hypertension: Secondary | ICD-10-CM

## 2018-08-26 MED ORDER — ONDANSETRON HCL 4 MG PO TABS: 4.0000 mg | ORAL_TABLET | Freq: Two times a day (BID) | ORAL | 0 refills | Status: DC | PRN

## 2018-08-26 NOTE — Progress Notes (Signed)
Location:  [facility] clinic Provider: Whit Bruni L. Mariea Clonts, D.O., C.M.D.  Code Status: DNR Goals of Care:  Advanced Directives 08/08/2018  Does [name] Have a Medical Advance Directive? No  Type of Advance Directive -  Does [name] want to make changes to medical advance directive? -  Copy of Carmen in Chart? -  Would [name] like information on creating a medical advance directive? No - [name] declined  Pre-existing out of facility DNR order (yellow form or pink MOST form) -     Chief Complaint  [name] presents with  . Hospitalization Follow-up    discharged 08/11/18  . Follow-up    blood pressure, anxiety    HPI: [name] is a 84 y.o. female with h/o paroxysmal afib, OA, low back pain, insomnia, peripheral neuropathy seen today for hospital follow-up s/p admission from 11/16-19 at [facility] with viral pneumonia.  She had presented with cough productive of yellow sputum and fever for 3 days, decreased appetite, fatigue, malaise and temp of 102F at home.  She had left sided lower chest wall and upper abdominal tenderness with coughing.  In ED, she was afebrile but hypoxic to 91% on RA.  WBC was normal.  She had mild hyponatremia to 132.  Normal lactic acid and normal chest xray.  She had rhonchi and wheezes bilaterally and injected conjunctiva.  She was admitted and treated with O2, pulmicort nebs, xopenex/ipratropium nebs, mucinex DM, and placed in droplet precautions.  Legionella antigen and blood cultures were obtained along with respiratory panel.  She was started on IV levaquin and also treated for presumed UTI (UA appeared positive).  She was admitted as a full code, but has DNR status as we've discussed several times.  At the time of discharge, she was feeling much better with less coughing.  She'd been able to ambulate 300 feet and sats improved to 92-97% on RA.  CT had confirmed a bilateral upper lobe predominantly peribronchial ground glass opacity consistent with  multifocal infection.  She had an 64mm pulmonary nodule and a 3-6 mo f/u CT was recommended.  She was discharged hom on 5 days of cefpodoxime and azithromycin.  Urine culture grew out klebsiella oxytoca/pneumoniae.  Blood cultures were negative.    CT also showed aortic atherosclerosis and thoracic aortic aneurysm.    Ms. Hogenson is still coughing some, but overall feels better from her pneumonia.  Her oxygen is normal.    However, this morning, she woke up at 3am and her cat had not returned home.  He goes outside in the evening and typically comes back on his own.  She then could not go back to sleep.  She was tearful, tremulous, anxious and upset and her heart was racing.  She called the clinic nurse this morning first thing and had a visit.  She was found to be in rapid afib and her BP 200/110.  I gave orders for lorazepam 0.5mg  once and lopressor 25mg  po once.  Clinic nurse administered to [name].    When she arrived for her appt today, she was calmer and bp had normalized with those meds and her prn chlorthalidone.  She had calmed down, but remained tearful and very sad.  Joelene Millin, her caregiver, noted that this has happened before with the cat not returning home if the door accidentally got closed.  Clinic nurse has notified security and housekeeping about him being missing and to be on the lookout.   Past Medical History:  Diagnosis Date  .  A-fib ([place])   . Abdominal pain, unspecified site   . Abnormality of gait 06/02/2013  . Arthritis   . Cataracts, bilateral   . Depression   . Diverticulosis of colon (without mention of hemorrhage)   . Diverticulosis of colon (without mention of hemorrhage) 10/27/2012  . Edema 10/27/2012  . GI bleed 06/2002  . Hemorrhage of gastrointestinal tract, unspecified   . History of stomach ulcers   . Hypertension   . Insomnia, unspecified 10/27/2012  . Localized osteoarthrosis not specified whether primary or secondary, lower leg 05/31/2011  . Migraines   .  Nausea alone 10/28/2012  . Obesity, unspecified 10/27/2012  . Osteoarthrosis, unspecified whether generalized or localized, lower leg   . Other acariasis    peripherial neuropathy  . Other and unspecified alcohol dependence, continuous drinking behavior 10/28/2012  . Palpitations   . PVC (premature ventricular contraction)   . Unspecified hereditary and idiopathic peripheral neuropathy   . UTI (lower urinary tract infection)    pt state uti w/ no pain    Past Surgical History:  Procedure Laterality Date  . ARTHROSCOPIC REPAIR ACL    . BREAST BIOPSY  1973   left  . BREAST REDUCTION SURGERY  12/23/2003  . cataracts    . CHOLECYSTECTOMY  2005  . TONSILLECTOMY AND ADENOIDECTOMY  1941    Allergies  Allergen Reactions  . Mirtazapine     tremor  . Nickel Other (See Comments)    inflammation  . Other Itching    Acrylic nails, and trace metals  . Prednisone     Causes sleep disturbances     Outpatient Encounter Medications as of 08/26/2018  Medication Sig  . acetaminophen (TYLENOL) 325 MG tablet Take 750-1,000 mg by mouth See admin instructions. Take 1000 mg in the morning and take 750 mg at night  . albuterol (PROVENTIL HFA;VENTOLIN HFA) 108 (90 Base) MCG/ACT inhaler Inhale 2 puffs into the lungs every 6 (six) hours as needed for wheezing or shortness of breath.  . allopurinol (ZYLOPRIM) 100 MG tablet Take 1 tablet (100 mg total) by mouth daily.  Marland Kitchen apixaban (ELIQUIS) 5 MG TABS tablet TAKE 5 MG BY MOUTH TWICE DAILY  . BYSTOLIC 10 MG tablet TAKE 1 TABLET BY MOUTH DAILY  . chlorthalidone (HYGROTON) 25 MG tablet Take 25 mg by mouth daily as needed. (SBP >180 mmHg)   . famotidine (PEPCID) 20 MG tablet Take 1 tablet (20 mg total) by mouth at bedtime.  . flecainide (TAMBOCOR) 50 MG tablet TAKE 1 TABLET BY MOUTH TWICE DAILY FOR ATRIAL FIBRILLATION  . furosemide (LASIX) 20 MG tablet Take 1 tablet (20 mg total) by mouth as needed.  Marland Kitchen HYDROcodone-acetaminophen (NORCO) 5-325 MG tablet Take 1 tablet  by mouth every 6 (six) hours as needed for moderate pain. One to two tabs every 4-6 hours for pain  . irbesartan (AVAPRO) 75 MG tablet TAKE 1 TABLET(75 MG) BY MOUTH DAILY  . Melatonin 5 MG TABS Take 5 mg by mouth at bedtime.  . ondansetron (ZOFRAN) 4 MG tablet Take 1 tablet (4 mg total) by mouth 2 (two) times daily as needed for nausea.  Marland Kitchen Phenylephrine HCl (NEO-SYNEPHRINE NA) Place 1 spray into the nose 2 (two) times daily as needed (congestion).  . sodium chloride (OCEAN) 0.65 % SOLN nasal spray Place 2 sprays into both nostrils 2 (two) times daily.   Marland Kitchen zolpidem (AMBIEN) 5 MG tablet Take 1 tablet (5 mg total) by mouth at bedtime as needed for sleep.  No facility-administered encounter medications on file as of 08/26/2018.     Review of Systems:  Review of Systems  Constitutional: Positive for malaise/fatigue. Negative for chills and fever.  HENT: Negative for congestion.   Respiratory: Positive for cough. Negative for shortness of breath.   Cardiovascular: Positive for palpitations. Negative for chest pain and leg swelling.  Gastrointestinal: Negative for abdominal pain.  Genitourinary: Negative for dysuria.  Musculoskeletal: Positive for back pain. Negative for falls.  Neurological: Negative for dizziness, loss of consciousness and headaches.  Psychiatric/Behavioral: Negative for depression and memory loss. The [name] is nervous/anxious and has insomnia.        Tearful    Health Maintenance  Topic Date Due  . TETANUS/TDAP  09/23/2020  . INFLUENZA VACCINE  Completed  . DEXA SCAN  Completed  . PNA vac Low Risk Adult  Completed    Physical Exam: Vitals:   08/26/18 1102  BP: 132/80  Pulse: (!) 115  Temp: 97.8 F (36.6 C)  TempSrc: Oral  SpO2: 97%  Weight: 180 lb (81.6 kg)  Height: 5\' 7"  (1.702 m)   Body mass index is 28.19 kg/m. Physical Exam  Constitutional: She is oriented to person, place, and time. She appears well-developed and well-nourished.  Cardiovascular:  Intact distal pulses.  Tachy, irreg irreg  Pulmonary/Chest: Effort normal and breath sounds normal. No respiratory distress. She has no wheezes. She has no rales.  Abdominal: Bowel sounds are normal.  Musculoskeletal: Normal range of motion.  Comes in walker  Neurological: She is alert and oriented to person, place, and time.  Skin: Skin is warm and dry.  Psychiatric:  Anxious and tearful, but no longer tremulous    Labs reviewed: Basic Metabolic Panel: Recent Labs    04/08/18 1037 08/08/18 1158 08/09/18 0413  NA 135 132* 134*  K 4.8 3.7 4.3  CL 98 95* 96*  CO2 27 26 28   GLUCOSE 119* 130* 110*  BUN 12 14 16   CREATININE 0.85 0.91 0.83  CALCIUM 9.5 8.9 8.7*  MG  --  1.7  --   PHOS  --  3.2  --    Liver Function Tests: Recent Labs    04/08/18 1037 08/08/18 1158 08/09/18 0413  AST 17 24 19   ALT 11 15 13   ALKPHOS  --  73 65  BILITOT 0.7 1.2 0.9  PROT 6.7 6.8 5.9*  ALBUMIN  --  3.8 3.3*   No results for input(s): LIPASE, AMYLASE in the last 8760 hours. No results for input(s): AMMONIA in the last 8760 hours. CBC: Recent Labs    08/08/18 1158 08/09/18 0413 08/10/18 0415  WBC 7.9 6.2 5.1  NEUTROABS 5.6 4.6 3.2  HGB 13.6 12.3 11.6*  HCT 41.4 38.1 36.7  MCV 97.2 99.7 101.1*  PLT 154 179 173   Lipid Panel: Recent Labs    04/08/18 1037  CHOL 198  HDL 74  LDLCALC 106*  TRIG 87  CHOLHDL 2.7   Lab Results  Component Value Date   HGBA1C 5.3 10/03/2015    Procedures since last visit: Dg Chest 2 View  Result Date: 08/08/2018 CLINICAL DATA:  Productive cough. EXAM: CHEST - 2 VIEW COMPARISON:  05/23/2017 FINDINGS: Coarse lung markings appear chronic. No focal airspace disease or pulmonary edema. There appears to be dextroscoliosis in the thoracic spine. Heart and mediastinum are stable. Atherosclerotic calcifications at the aortic arch. No large pleural effusions. IMPRESSION: No active cardiopulmonary disease. Electronically Signed   By: Markus Daft M.D.   On:  08/08/2018 12:48    Assessment/Plan 1. Atrial fibrillation with RVR (HCC) -rate control improved - EKG 12-Lead unchanged from prior except faster rate--machine incorrectly read out VT when she's in afib -nurse notes pt looks much better than early this morning -repeat lopressor to get pulse to 100 or less--still 112 here, 111 on ekg  2. Essential hypertension -bp now under control - EKG 12-Lead  3. Panic attack - due to cat being missing - given lorazepam 0.5mg  this morning with benefit--if cat does not return, she may need this every few hours for a few days to keep her calm - EKG 12-Lead  4. Pulmonary nodule -noted on CT chest incidentally  -will order f/u CT for nodule in 3-6 mos  5. Nausea - ondansetron (ZOFRAN) 4 MG tablet; Take 1 tablet (4 mg total) by mouth 2 (two) times daily as needed for nausea.  Dispense: 20 tablet; Refill: 0  6. Thoracic aortic aneurysm without rupture ([place]) -noted on Ct incidentally, as well.  Labs/tests ordered:   Orders Placed This Encounter  Procedures  . CT CHEST WO CONTRAST    Standing Status:   Future    Standing Expiration Date:   10/28/2019    Order Specific Question:   ** REASON FOR EXAM (FREE TEXT)    Answer:   0.8cm pulmonary nodule on CT in 11/19 found incidentally--3-6 month f/u recommended    Order Specific Question:   Preferred imaging location?    Answer:   [id number]    Order Specific Question:   Radiology Contrast Protocol - do NOT remove file path    Answer:   [email or web address]  . EKG 12-Lead    Next appt: 11/25/2018   Velina Drollinger L. Wilgus Deyton, D.O. [facility] [address], [place] [address] Cell Phone (Mon-Fri 8am-5pm):  [phone] On Call:  [phone] & follow prompts after 5pm & weekends Office Phone:  [phone] Office Fax:  [phone]

## 2018-09-03 ENCOUNTER — Ambulatory Visit
Admission: RE | Admit: 2018-09-03 | Discharge: 2018-09-03 | Disposition: A | Discharge status: Home / Self Care | Payer: Medicare Other | Source: Ambulatory Visit | Attending: Internal Medicine | Admitting: Internal Medicine

## 2018-09-03 DIAGNOSIS — J189 Pneumonia, unspecified organism: Secondary | ICD-10-CM | POA: Diagnosis not present

## 2018-09-03 DIAGNOSIS — R918 Other nonspecific abnormal finding of lung field: Secondary | ICD-10-CM

## 2018-09-04 ENCOUNTER — Encounter: Payer: Self-pay | Admitting: *Deleted

## 2018-09-20 ENCOUNTER — Other Ambulatory Visit: Payer: Self-pay | Admitting: Internal Medicine

## 2018-10-01 ENCOUNTER — Other Ambulatory Visit: Payer: Self-pay | Admitting: Internal Medicine

## 2018-10-01 DIAGNOSIS — R05 Cough: Secondary | ICD-10-CM

## 2018-10-01 DIAGNOSIS — R059 Cough, unspecified: Secondary | ICD-10-CM

## 2018-10-19 ENCOUNTER — Other Ambulatory Visit: Payer: Self-pay | Admitting: Internal Medicine

## 2018-10-22 ENCOUNTER — Other Ambulatory Visit: Payer: Self-pay | Admitting: Internal Medicine

## 2018-11-19 ENCOUNTER — Other Ambulatory Visit: Payer: Self-pay | Admitting: Internal Medicine

## 2018-11-19 NOTE — Telephone Encounter (Signed)
rx called into pharmacy

## 2018-11-25 ENCOUNTER — Encounter: Payer: Medicare Other | Admitting: Internal Medicine

## 2018-11-27 ENCOUNTER — Other Ambulatory Visit: Payer: Self-pay | Admitting: *Deleted

## 2018-11-27 ENCOUNTER — Other Ambulatory Visit: Payer: Self-pay | Admitting: Internal Medicine

## 2018-11-27 MED ORDER — FUROSEMIDE 20 MG PO TABS: 20.0000 mg | ORAL_TABLET | ORAL | 0 refills | Status: DC | PRN

## 2018-11-30 ENCOUNTER — Encounter: Payer: Self-pay | Admitting: Allergy and Immunology

## 2018-11-30 ENCOUNTER — Ambulatory Visit (INDEPENDENT_AMBULATORY_CARE_PROVIDER_SITE_OTHER): Payer: Medicare Other | Admitting: Allergy and Immunology

## 2018-11-30 VITALS — BP 188/82 | HR 73 | Temp 97.7°F | Resp 18

## 2018-11-30 DIAGNOSIS — K219 Gastro-esophageal reflux disease without esophagitis: Secondary | ICD-10-CM

## 2018-11-30 DIAGNOSIS — R05 Cough: Secondary | ICD-10-CM

## 2018-11-30 DIAGNOSIS — J310 Chronic rhinitis: Secondary | ICD-10-CM

## 2018-11-30 DIAGNOSIS — I10 Essential (primary) hypertension: Secondary | ICD-10-CM | POA: Diagnosis not present

## 2018-11-30 DIAGNOSIS — R053 Chronic cough: Secondary | ICD-10-CM

## 2018-11-30 MED ORDER — OMEPRAZOLE 20 MG PO CPDR: 20.0000 mg | DELAYED_RELEASE_CAPSULE | Freq: Every day | ORAL | 5 refills | Status: DC

## 2018-11-30 MED ORDER — ALBUTEROL SULFATE HFA 108 (90 BASE) MCG/ACT IN AERS: 2.0000 | INHALATION_SPRAY | Freq: Four times a day (QID) | RESPIRATORY_TRACT | 1 refills | Status: DC | PRN

## 2018-11-30 MED ORDER — MONTELUKAST SODIUM 10 MG PO TABS: 10.0000 mg | ORAL_TABLET | Freq: Every day | ORAL | 5 refills | Status: DC

## 2018-11-30 MED ORDER — FAMOTIDINE 20 MG PO TABS: 20.0000 mg | ORAL_TABLET | Freq: Two times a day (BID) | ORAL | 5 refills | Status: DC

## 2018-11-30 MED ORDER — AZELASTINE HCL 0.15 % NA SOLN: 2.0000 | Freq: Two times a day (BID) | NASAL | 5 refills | Status: DC | PRN

## 2018-11-30 NOTE — Progress Notes (Signed)
.  li

## 2018-11-30 NOTE — Assessment & Plan Note (Deleted)
   The patient has been made aware of the elevated blood pressure reading and has been encouraged to follow up with her primary care physician in the near future regarding this issue. 

## 2018-11-30 NOTE — Progress Notes (Signed)
New Patient Note  RE: [name] MRN: [id number] DOB: Jan 04, 1933 Date of Office Visit: 11/30/2018  Referring provider: Gayland Curry, DO Primary care provider: Gayland Curry, DO  Chief Complaint: Cough and Nasal Congestion  History of present illness: [name] is a 85 y.o. female seen today in consultation requested by Hollace Kinnier, DO.  She reports that over the past 8 years she has experienced a persistent cough.  The cough is described as a "tickle cough" in the base of her throat.  She states that she starts coughing and then that triggers sneezing which in turn causes "[place]" rhinorrhea.  She also experiences nasal congestion which she attempts to control with nasal saline spray but occasionally requires Neo-Synephrine spray.  No significant seasonal symptom variation has been noted nor have specific environmental triggers been identified.  She treats gastroesophageal reflux with 20 mg of famotidine at bedtime.  While on the famotidine she requires calcium carbonate 1 time per month on average for heartburn.  She denies wheezing, however does experience shortness of breath with exertion.  She is uncertain if this dyspnea with exertion is secondary to bronchoconstriction or deconditioning.  She has been prescribed albuterol HFA in the past but does not recall having tried to assess symptom relief.  Assessment and plan: Cough, persistent All seasonal and perennial aeroallergens skin test, both epicutaneous and intradermal, were negative today despite a positive histamine control.  Spirometry revealed mild restrictive pattern with 120 mL postbronchodilator improvement.  Based upon the history and physical examination, this most likely represents nonallergic upper airway cough syndrome with possible contribution from silent reflux and/or bronchial hyperresponsiveness. Given the persistence of this problem and the patient's frustration with the symptoms, we will address all 3 of  these possibilities and then hope to taper back intervention in the context of symptom reduction. A prescription has been provided for azelastine nasal spray, 1-2 sprays per nostril 2 times daily as needed. Proper nasal spray technique has been discussed and demonstrated.   Nasal saline lavage (NeilMed) has been recommended as needed and prior to medicated nasal sprays along with instructions for proper administration.  For thick post nasal drainage, add guaifenesin [phone] mg (Mucinex)  twice daily as needed with adequate hydration as discussed.  A prescription has been provided for montelukast 10 mg daily at bedtime.  Albuterol HFA, 2 inhalations every 4-6 hours if needed.  Proper reflux lifestyle modifications have been discussed and provided in written form.  For now, increase famotidine 20 mg to twice daily.  We will start a 8-week therapeutic trial of omeprazole 20 mg daily, 30 minutes prior to breakfast.  We will regroup in 8 weeks to assess progress and make adjustments at that time.  Chronic rhinitis  Treatment plan as outlined above.  Gastroesophageal reflux disease  Treatment plan as outlined above.  Essential hypertension  The patient has been made aware of the elevated blood pressure reading and has been encouraged to follow up with her primary care physician in the near future regarding this issue.     Meds ordered this encounter  Medications  . Azelastine HCl 0.15 % SOLN    Sig: Place 2 sprays into both nostrils 2 (two) times daily as needed.    Dispense:  30 mL    Refill:  5  . montelukast (SINGULAIR) 10 MG tablet    Sig: Take 1 tablet (10 mg total) by mouth at bedtime.    Dispense:  30 tablet  Refill:  5  . albuterol (PROVENTIL HFA;VENTOLIN HFA) 108 (90 Base) MCG/ACT inhaler    Sig: Inhale 2 puffs into the lungs every 6 (six) hours as needed for wheezing or shortness of breath.    Dispense:  18 g    Refill:  1  . famotidine (PEPCID) 20 MG tablet     Sig: Take 1 tablet (20 mg total) by mouth 2 (two) times daily.    Dispense:  60 tablet    Refill:  5  . omeprazole (PRILOSEC) 20 MG capsule    Sig: Take 1 capsule (20 mg total) by mouth daily.    Dispense:  30 capsule    Refill:  5    Diagnostics: Spirometry: FVC was 2.13 L (75% predicted) and FEV1 was 1.51 L (71% predicted) with 120 mL (8%) postbronchodilator improvement.  This study was performed while the patient was asymptomatic.  Please see scanned spirometry results for details. Epicutaneous testing: Negative despite a positive histamine control. Intradermal testing: Negative.   Physical examination: Blood pressure (!) 188/82, pulse 73, temperature 97.7 F (36.5 C), temperature source Oral, resp. rate 18, SpO2 94 %.  General: Alert, interactive, in no acute distress. HEENT: TMs pearly gray, turbinates moderately edematous with thick discharge, post-pharynx markedly erythematous. Neck: Supple without lymphadenopathy. Lungs: Clear to auscultation without wheezing, rhonchi or rales. CV: Normal S1, S2 without murmurs. Abdomen: Nondistended, nontender. Skin: Warm and dry, without lesions or rashes. Extremities:  No clubbing, cyanosis or edema. Neuro:   Grossly intact.  Review of systems:  Review of systems negative except as noted in HPI / PMHx or noted below: Review of Systems  Constitutional: Negative.   HENT: Negative.   Eyes: Negative.   Respiratory: Negative.   Cardiovascular: Negative.   Gastrointestinal: Negative.   Genitourinary: Negative.   Musculoskeletal: Negative.   Skin: Negative.   Neurological: Negative.   Endo/Heme/Allergies: Negative.   Psychiatric/Behavioral: Negative.     Past medical history:  Past Medical History:  Diagnosis Date  . A-fib ([place])   . Abdominal pain, unspecified site   . Abnormality of gait 06/02/2013  . Arthritis   . Cataracts, bilateral   . Depression   . Diverticulosis of colon (without mention of hemorrhage)   .  Diverticulosis of colon (without mention of hemorrhage) 10/27/2012  . Edema 10/27/2012  . GI bleed 06/2002  . Hemorrhage of gastrointestinal tract, unspecified   . History of stomach ulcers   . Hypertension   . Insomnia, unspecified 10/27/2012  . Localized osteoarthrosis not specified whether primary or secondary, lower leg 05/31/2011  . Migraines   . Nausea alone 10/28/2012  . Obesity, unspecified 10/27/2012  . Osteoarthrosis, unspecified whether generalized or localized, lower leg   . Other acariasis    peripherial neuropathy  . Other and unspecified alcohol dependence, continuous drinking behavior 10/28/2012  . Palpitations   . PVC (premature ventricular contraction)   . Unspecified hereditary and idiopathic peripheral neuropathy   . UTI (lower urinary tract infection)    pt state uti w/ no pain    Past surgical history:  Past Surgical History:  Procedure Laterality Date  . ARTHROSCOPIC REPAIR ACL    . BREAST BIOPSY  1973   left  . BREAST REDUCTION SURGERY  12/23/2003  . cataracts    . CHOLECYSTECTOMY  2005  . TONSILLECTOMY AND ADENOIDECTOMY  1941    Family history: Family History  Problem Relation Age of Onset  . Breast cancer Mother   . Hypertension Mother   .  Hypertension Father   . Allergic rhinitis Neg Hx   . Asthma Neg Hx     Social history: Social History   Socioeconomic History  . Marital status: Widowed    Spouse name: Not on file  . Number of children: Not on file  . Years of education: Not on file  . Highest education level: Not on file  Occupational History  . Occupation: retired Marine scientist  Social Needs  . Financial resource strain: Not hard at all  . Food insecurity:    Worry: Never true    Inability: Never true  . Transportation needs:    Medical: No    Non-medical: No  Tobacco Use  . Smoking status: Former Smoker    Packs/day: 1.00    Years: 30.00    Pack years: 30.00    Types: Cigarettes    Last attempt to quit: 10/09/1983    Years since quitting:  35.1  . Smokeless tobacco: Never Used  Substance and Sexual Activity  . Alcohol use: Yes    Alcohol/week: 1.0 - 2.0 standard drinks    Types: 1 - 2 Shots of liquor per week    Comment: 2 DRINKS A DAY - daily  scotch   . Drug use: No  . Sexual activity: Never  Lifestyle  . Physical activity:    Days per week: 0 days    Minutes per session: 0 [name]  . Stress: Only a little  Relationships  . Social connections:    Talks on phone: Three times a week    Gets together: Three times a week    Attends religious service: Never    Active member of club or organization: No    Attends meetings of clubs or organizations: Never    Relationship status: Widowed  . Intimate partner violence:    Fear of current or ex partner: No    Emotionally abused: No    Physically abused: No    Forced sexual activity: No  Other Topics Concern  . Not on file  Social History Narrative   Lives at [facility], in [place] -2004   Stopped smoking [place], DNR, Living Will   [place] with walker   Alcohol scotch daily    Exercise none   Environmental History: The patient lives in a [address] year old townhouse with carpeting throughout and central air/heat.  There is no known mold/water damage in the home.  There is a cat in the home which has access to her bedroom.  She is a former cigarette smoker having smoked a pack a day on average from 1958-1985.  Allergies as of 11/30/2018      Reactions   Mirtazapine    tremor   Nickel Other (See Comments)   inflammation   Other Itching   Acrylic nails, and trace metals   Prednisone    Causes sleep disturbances       Medication List       Accurate as of November 30, 2018  1:15 PM. Always use your most recent med list.        acetaminophen 325 MG tablet Commonly known as:  TYLENOL Take 750-1,000 mg by mouth See admin instructions. Take 1000 mg in the morning and take 750 mg at night   albuterol 108 (90 Base) MCG/ACT inhaler Commonly known as:  PROVENTIL  HFA;VENTOLIN HFA Inhale 2 puffs into the lungs every 6 (six) hours as needed for wheezing or shortness of breath.   allopurinol 100 MG  tablet Commonly known as:  ZYLOPRIM TAKE 1 TABLET(100 MG) BY MOUTH DAILY   apixaban 5 MG Tabs tablet Commonly known as:  Eliquis TAKE 5 MG BY MOUTH TWICE DAILY   Azelastine HCl 0.15 % Soln Place 2 sprays into both nostrils 2 (two) times daily as needed.   Bystolic 10 MG tablet Generic drug:  nebivolol TAKE 1 TABLET BY MOUTH DAILY   chlorthalidone 25 MG tablet Commonly known as:  HYGROTON Take 25 mg by mouth daily as needed. (SBP >180 mmHg)   famotidine 20 MG tablet Commonly known as:  Pepcid Take 1 tablet (20 mg total) by mouth 2 (two) times daily.   flecainide 50 MG tablet Commonly known as:  TAMBOCOR TAKE 1 TABLET BY MOUTH TWICE DAILY FOR ATRIAL FIBRILLATION   furosemide 20 MG tablet Commonly known as:  LASIX Take 1 tablet (20 mg total) by mouth as needed.   HYDROcodone-acetaminophen 5-325 MG tablet Commonly known as:  Norco Take 1 tablet by mouth every 6 (six) hours as needed for moderate pain. One to two tabs every 4-6 hours for pain   irbesartan 75 MG tablet Commonly known as:  AVAPRO TAKE 1 TABLET BY MOUTH DAILY   Melatonin 5 MG Tabs Take 5 mg by mouth at bedtime.   montelukast 10 MG tablet Commonly known as:  Singulair Take 1 tablet (10 mg total) by mouth at bedtime.   NEO-SYNEPHRINE NA Place 1 spray into the nose 2 (two) times daily as needed (congestion).   omeprazole 20 MG capsule Commonly known as:  PriLOSEC Take 1 capsule (20 mg total) by mouth daily.   ondansetron 4 MG tablet Commonly known as:  ZOFRAN Take 1 tablet (4 mg total) by mouth 2 (two) times daily as needed for nausea.   sodium chloride 0.65 % Soln nasal spray Commonly known as:  OCEAN Place 2 sprays into both nostrils 2 (two) times daily.   zolpidem 5 MG tablet Commonly known as:  AMBIEN TAKE 1 TABLET BY MOUTH AT BEDTIME AS NEEDED FOR SLEEP        Known medication allergies: Allergies  Allergen Reactions  . Mirtazapine     tremor  . Nickel Other (See Comments)    inflammation  . Other Itching    Acrylic nails, and trace metals  . Prednisone     Causes sleep disturbances     I appreciate the opportunity to take part in [name]'s care. Please do not hesitate to contact me with questions.  Sincerely,   R. Edgar Frisk, MD

## 2018-11-30 NOTE — Assessment & Plan Note (Addendum)
All seasonal and perennial aeroallergens skin test, both epicutaneous and intradermal, were negative today despite a positive histamine control.  Spirometry revealed mild restrictive pattern with 120 mL postbronchodilator improvement.  Based upon the history and physical examination, this most likely represents nonallergic upper airway cough syndrome with possible contribution from silent reflux and/or bronchial hyperresponsiveness. Given the persistence of this problem and the patient's frustration with the symptoms, we will address all 3 of these possibilities and then hope to taper back intervention in the context of symptom reduction. A prescription has been provided for azelastine nasal spray, 1-2 sprays per nostril 2 times daily as needed. Proper nasal spray technique has been discussed and demonstrated.   Nasal saline lavage (NeilMed) has been recommended as needed and prior to medicated nasal sprays along with instructions for proper administration.  For thick post nasal drainage, add guaifenesin [phone] mg (Mucinex)  twice daily as needed with adequate hydration as discussed.  A prescription has been provided for montelukast 10 mg daily at bedtime.  Albuterol HFA, 2 inhalations every 4-6 hours if needed.  Proper reflux lifestyle modifications have been discussed and provided in written form.  For now, increase famotidine 20 mg to twice daily.  We will start a 8-week therapeutic trial of omeprazole 20 mg daily, 30 minutes prior to breakfast.  We will regroup in 8 weeks to assess progress and make adjustments at that time.

## 2018-11-30 NOTE — Assessment & Plan Note (Addendum)
   Treatment plan as outlined above. 

## 2018-11-30 NOTE — Patient Instructions (Addendum)
Cough, persistent All seasonal and perennial aeroallergens skin test, both epicutaneous and intradermal, were negative today despite a positive histamine control.  Spirometry revealed mild restrictive pattern with 120 mL postbronchodilator improvement.  Based upon the history and physical examination, this most likely represents nonallergic upper airway cough syndrome with possible contribution from silent reflux and/or bronchial hyperresponsiveness. Given the persistence of this problem and the patient's frustration with the symptoms, we will address all 3 of these possibilities and then hope to taper back intervention in the context of symptom reduction. A prescription has been provided for azelastine nasal spray, 1-2 sprays per nostril 2 times daily as needed. Proper nasal spray technique has been discussed and demonstrated.   Nasal saline lavage (NeilMed) has been recommended as needed and prior to medicated nasal sprays along with instructions for proper administration.  For thick post nasal drainage, add guaifenesin [phone] mg (Mucinex)  twice daily as needed with adequate hydration as discussed.  A prescription has been provided for montelukast 10 mg daily at bedtime.  Albuterol HFA, 2 inhalations every 4-6 hours if needed.  Proper reflux lifestyle modifications have been discussed and provided in written form.  For now, increase famotidine 20 mg to twice daily.  We will start a 8-week therapeutic trial of omeprazole 20 mg daily, 30 minutes prior to breakfast.  We will regroup in 8 weeks to assess progress and make adjustments at that time.  Chronic rhinitis  Treatment plan as outlined above.  Gastroesophageal reflux disease  Treatment plan as outlined above.  Essential hypertension  The patient has been made aware of the elevated blood pressure reading and has been encouraged to follow up with her primary care physician in the near future regarding this issue.     Return in  about 8 weeks (around 01/25/2019), or if symptoms worsen or fail to improve.  Lifestyle Changes for Controlling GERD  When you have GERD, stomach acid feels as if it's backing up toward your mouth. Whether or not you take medication to control your GERD, your symptoms can often be improved with lifestyle changes.   Raise Your Head  Reflux is more likely to strike when you're lying down flat, because stomach fluid can  flow backward more easily. Raising the head of your bed 4-6 inches can help. To do this:  Slide blocks or books under the legs at the head of your bed. Or, place a wedge under  the mattress. Many foam stores can make a suitable wedge for you. The wedge  should run from your waist to the top of your head.  Don't just prop your head on several pillows. This increases pressure on your  stomach. It can make GERD worse.  Watch Your Eating Habits Certain foods may increase the acid in your stomach or relax the lower esophageal sphincter, making GERD more likely. It's best to avoid the following:  Coffee, tea, and carbonated drinks (with and without caffeine)  Fatty, fried, or spicy food  Mint, chocolate, onions, and tomatoes  Any other foods that seem to irritate your stomach or cause you pain  Relieve the Pressure  Eat smaller meals, even if you have to eat more often.  Don't lie down right after you eat. Wait a few hours for your stomach to empty.  Avoid tight belts and tight-fitting clothes.  Lose excess weight.  Tobacco and Alcohol  Avoid smoking tobacco and drinking alcohol. They can make GERD symptoms worse.

## 2018-11-30 NOTE — Assessment & Plan Note (Signed)
   The patient has been made aware of the elevated blood pressure reading and has been encouraged to follow up with her primary care physician in the near future regarding this issue. 

## 2018-12-02 ENCOUNTER — Encounter: Payer: Medicare Other | Admitting: Internal Medicine

## 2018-12-07 ENCOUNTER — Ambulatory Visit (INDEPENDENT_AMBULATORY_CARE_PROVIDER_SITE_OTHER): Payer: Medicare Other | Admitting: Physician Assistant

## 2018-12-07 ENCOUNTER — Other Ambulatory Visit: Payer: Self-pay

## 2018-12-07 ENCOUNTER — Encounter (INDEPENDENT_AMBULATORY_CARE_PROVIDER_SITE_OTHER): Payer: Self-pay | Admitting: Physician Assistant

## 2018-12-07 DIAGNOSIS — M170 Bilateral primary osteoarthritis of knee: Secondary | ICD-10-CM

## 2018-12-07 MED ORDER — LIDOCAINE HCL 1 % IJ SOLN
0.5000 mL | INTRAMUSCULAR | Status: AC | PRN
  Administered 2018-12-07: .5 mL

## 2018-12-07 MED ORDER — METHYLPREDNISOLONE ACETATE 40 MG/ML IJ SUSP
40.0000 mg | INTRAMUSCULAR | Status: AC | PRN
  Administered 2018-12-07: 40 mg via INTRA_ARTICULAR

## 2018-12-07 NOTE — Progress Notes (Signed)
   Procedure Note  Patient: [name]             Date of Birth: [birth year]           MRN: [id number]             Visit Date: 12/07/2018 HPI: Ms. Hildred Alamin comes in today requesting injections both knees.  She states that the injections given to her in October 2019 gave her good results first least month and 1/2 to 2 months.  She is requesting injections in both knees today.  She is had no new injury to either knee.  Bilateral knees: Right knee no abnormal warmth erythema edema but small effusion at best.  Left knee no effusion positive edema.  No abnormal warmth or erythema of the left knee. Procedures: Visit Diagnoses: Primary osteoarthritis of both knees  Large Joint Inj: bilateral knee on 12/07/2018 11:13 AM Indications: pain Details: 22 G 1.5 in needle, anterolateral approach  Arthrogram: No  Medications (Right): 0.5 mL lidocaine 1 %; 40 mg methylPREDNISolone acetate 40 MG/ML Medications (Left): 0.5 mL lidocaine 1 %; 40 mg methylPREDNISolone acetate 40 MG/ML Outcome: tolerated well, no immediate complications Procedure, treatment alternatives, risks and benefits explained, specific risks discussed. Consent was given by the patient. Immediately prior to procedure a time out was called to verify the correct patient, procedure, equipment, support staff and site/side marked as required. Patient was prepped and draped in the usual sterile fashion.     Plan: She will follow-up on as-needed basis.  Questions encouraged and answered

## 2018-12-09 ENCOUNTER — Encounter: Payer: Self-pay | Admitting: Internal Medicine

## 2018-12-09 ENCOUNTER — Other Ambulatory Visit: Payer: Self-pay

## 2018-12-09 ENCOUNTER — Non-Acute Institutional Stay: Payer: Medicare Other | Admitting: Internal Medicine

## 2018-12-09 DIAGNOSIS — F325 Major depressive disorder, single episode, in full remission: Secondary | ICD-10-CM | POA: Diagnosis not present

## 2018-12-09 DIAGNOSIS — I4891 Unspecified atrial fibrillation: Secondary | ICD-10-CM

## 2018-12-09 DIAGNOSIS — R918 Other nonspecific abnormal finding of lung field: Secondary | ICD-10-CM | POA: Diagnosis not present

## 2018-12-09 DIAGNOSIS — I700 Atherosclerosis of aorta: Secondary | ICD-10-CM

## 2018-12-09 DIAGNOSIS — D6869 Other thrombophilia: Secondary | ICD-10-CM

## 2018-12-09 DIAGNOSIS — I5032 Chronic diastolic (congestive) heart failure: Secondary | ICD-10-CM

## 2018-12-09 DIAGNOSIS — F1020 Alcohol dependence, uncomplicated: Secondary | ICD-10-CM | POA: Diagnosis not present

## 2018-12-09 NOTE — Progress Notes (Signed)
Location:  [facility] of Service:  Clinic (12)  Provider: Fe Okubo L. Mariea Clonts, D.O., C.M.D.  Code Status: DNR Goals of Care:  Advanced Directives 08/08/2018  Does Patient Have a Medical Advance Directive? No  Type of Advance Directive -  Does patient want to make changes to medical advance directive? -  Copy of [place] in Chart? -  Would patient like information on creating a medical advance directive? No - Patient declined  Pre-existing out of facility DNR order (yellow form or pink MOST form) -     Chief Complaint  Patient presents with  . Medical Management of Chronic Issues    67mth follow-up    HPI: Patient is a 85 y.o. female seen today for medical management of chronic diseases.    Asks how her nodule is doing in her lung.  Is due for 3-6 month f/u CT.    Will cough, then have a sneezing fit, then a vesuvius of clear mucus.  No longer purulent.  Went to Dr. Verlin Fester and had allergy testing on her arms and back.  She has no allergies.  He said upper airway nonallergic cough syndrome was cause and recommended nasal saline lavage, mucinex, albuterol prn, singulair which pharmacy told her not to take, famotidine 20mg  po bid and omeprazole daily before breakfast for 8 weeks.  Discussed compression hose with her for her venous insufficiency--can get at [facility].    Past Medical History:  Diagnosis Date  . A-fib ([place])   . Abdominal pain, unspecified site   . Abnormality of gait 06/02/2013  . Arthritis   . Cataracts, bilateral   . Depression   . Diverticulosis of colon (without mention of hemorrhage)   . Diverticulosis of colon (without mention of hemorrhage) 10/27/2012  . Edema 10/27/2012  . GI bleed 06/2002  . Hemorrhage of gastrointestinal tract, unspecified   . History of stomach ulcers   . Hypertension   . Insomnia, unspecified 10/27/2012  . Localized osteoarthrosis not specified whether primary or secondary,  lower leg 05/31/2011  . Migraines   . Nausea alone 10/28/2012  . Obesity, unspecified 10/27/2012  . Osteoarthrosis, unspecified whether generalized or localized, lower leg   . Other acariasis    peripherial neuropathy  . Other and unspecified alcohol dependence, continuous drinking behavior 10/28/2012  . Palpitations   . PVC (premature ventricular contraction)   . Unspecified hereditary and idiopathic peripheral neuropathy   . UTI (lower urinary tract infection)    pt state uti w/ no pain    Past Surgical History:  Procedure Laterality Date  . ARTHROSCOPIC REPAIR ACL    . BREAST BIOPSY  1973   left  . BREAST REDUCTION SURGERY  12/23/2003  . cataracts    . CHOLECYSTECTOMY  2005  . TONSILLECTOMY AND ADENOIDECTOMY  1941    Allergies  Allergen Reactions  . Mirtazapine     tremor  . Nickel Other (See Comments)    inflammation  . Other Itching    Acrylic nails, and trace metals  . Prednisone     Causes sleep disturbances     Outpatient Encounter Medications as of 12/09/2018  Medication Sig  . acetaminophen (TYLENOL) 325 MG tablet Take 750-1,000 mg by mouth See admin instructions. Take 1000 mg in the morning and take 750 mg at night  . albuterol (PROVENTIL HFA;VENTOLIN HFA) 108 (90 Base) MCG/ACT inhaler Inhale 2 puffs into the lungs every 6 (six) hours as needed for wheezing  or shortness of breath.  . allopurinol (ZYLOPRIM) 100 MG tablet TAKE 1 TABLET(100 MG) BY MOUTH DAILY  . apixaban (ELIQUIS) 5 MG TABS tablet TAKE 5 MG BY MOUTH TWICE DAILY  . Azelastine HCl 0.15 % SOLN Place 2 sprays into both nostrils 2 (two) times daily as needed.  Marland Kitchen BYSTOLIC 10 MG tablet TAKE 1 TABLET BY MOUTH DAILY  . chlorthalidone (HYGROTON) 25 MG tablet Take 25 mg by mouth daily as needed. (SBP >180 mmHg)   . famotidine (PEPCID) 20 MG tablet Take 1 tablet (20 mg total) by mouth 2 (two) times daily.  . flecainide (TAMBOCOR) 50 MG tablet TAKE 1 TABLET BY MOUTH TWICE DAILY FOR ATRIAL FIBRILLATION  . furosemide  (LASIX) 20 MG tablet Take 1 tablet (20 mg total) by mouth as needed.  Marland Kitchen HYDROcodone-acetaminophen (NORCO) 5-325 MG tablet Take 1 tablet by mouth every 6 (six) hours as needed for moderate pain. One to two tabs every 4-6 hours for pain  . irbesartan (AVAPRO) 75 MG tablet TAKE 1 TABLET BY MOUTH DAILY  . Melatonin 5 MG TABS Take 5 mg by mouth at bedtime.  . montelukast (SINGULAIR) 10 MG tablet Take 1 tablet (10 mg total) by mouth at bedtime.  Marland Kitchen omeprazole (PRILOSEC) 20 MG capsule Take 1 capsule (20 mg total) by mouth daily.  . ondansetron (ZOFRAN) 4 MG tablet Take 1 tablet (4 mg total) by mouth 2 (two) times daily as needed for nausea.  Marland Kitchen Phenylephrine HCl (NEO-SYNEPHRINE NA) Place 1 spray into the nose 2 (two) times daily as needed (congestion).  . sodium chloride (OCEAN) 0.65 % SOLN nasal spray Place 2 sprays into both nostrils 2 (two) times daily.   Marland Kitchen zolpidem (AMBIEN) 5 MG tablet TAKE 1 TABLET BY MOUTH AT BEDTIME AS NEEDED FOR SLEEP   No facility-administered encounter medications on file as of 12/09/2018.     Review of Systems:  Review of Systems  Constitutional: Negative for chills, fever and malaise/fatigue.  HENT: Negative for congestion and hearing loss.   Eyes: Negative for blurred vision.  Respiratory: Positive for cough and sputum production. Negative for shortness of breath and wheezing.        Clear sputum  Cardiovascular: Positive for leg swelling. Negative for chest pain, palpitations, orthopnea and PND.  Gastrointestinal: Negative for abdominal pain, blood in stool, constipation, diarrhea and melena.  Genitourinary: Negative for dysuria.  Musculoskeletal: Positive for back pain. Negative for falls and joint pain.  Skin: Negative for itching and rash.  Neurological: Negative for dizziness and loss of consciousness.  Endo/Heme/Allergies: Bruises/bleeds easily.  Psychiatric/Behavioral: Negative for depression and memory loss. The patient is nervous/anxious. The patient does not  have insomnia.     Health Maintenance  Topic Date Due  . TETANUS/TDAP  09/23/2020  . INFLUENZA VACCINE  Completed  . DEXA SCAN  Completed  . PNA vac Low Risk Adult  Completed    Physical Exam: Vitals:   12/09/18 0947  BP: 140/80  Pulse: 63  Temp: 97.9 F (36.6 C)  TempSrc: Oral  SpO2: 97%  Weight: 193 lb (87.5 kg)  Height: 5\' 7"  (1.702 m)   Body mass index is 30.23 kg/m. Physical Exam Vitals signs reviewed.  Constitutional:      Appearance: Normal appearance.  HENT:     Head: Normocephalic and atraumatic.  Eyes:     Comments: glasses  Cardiovascular:     Rate and Rhythm: Normal rate and regular rhythm.  Pulmonary:     Effort: Pulmonary effort is  normal.     Breath sounds: Normal breath sounds. No wheezing, rhonchi or rales.  Abdominal:     General: Bowel sounds are normal.  Musculoskeletal: Normal range of motion.     Right lower leg: Edema present.     Left lower leg: Edema present.  Skin:    General: Skin is warm and dry.     Capillary Refill: Capillary refill takes less than 2 seconds.     Comments: Brownish and pinkish skin discoloration of lower legs  Neurological:     General: No focal deficit present.     Mental Status: She is alert and oriented to person, place, and time.  Psychiatric:        Mood and Affect: Mood normal.        Behavior: Behavior normal.        Thought Content: Thought content normal.        Judgment: Judgment normal.     Labs reviewed: Basic Metabolic Panel: Recent Labs    04/08/18 1037 08/08/18 1158 08/09/18 0413  NA 135 132* 134*  K 4.8 3.7 4.3  CL 98 95* 96*  CO2 27 26 28   GLUCOSE 119* 130* 110*  BUN 12 14 16   CREATININE 0.85 0.91 0.83  CALCIUM 9.5 8.9 8.7*  MG  --  1.7  --   PHOS  --  3.2  --    Liver Function Tests: Recent Labs    04/08/18 1037 08/08/18 1158 08/09/18 0413  AST 17 24 19   ALT 11 15 13   ALKPHOS  --  73 65  BILITOT 0.7 1.2 0.9  PROT 6.7 6.8 5.9*  ALBUMIN  --  3.8 3.3*   No results for  input(s): LIPASE, AMYLASE in the last 8760 hours. No results for input(s): AMMONIA in the last 8760 hours. CBC: Recent Labs    08/08/18 1158 08/09/18 0413 08/10/18 0415  WBC 7.9 6.2 5.1  NEUTROABS 5.6 4.6 3.2  HGB 13.6 12.3 11.6*  HCT 41.4 38.1 36.7  MCV 97.2 99.7 101.1*  PLT 154 179 173   Lipid Panel: Recent Labs    04/08/18 1037  CHOL 198  HDL 74  LDLCALC 106*  TRIG 87  CHOLHDL 2.7   Lab Results  Component Value Date   HGBA1C 5.3 10/03/2015    Assessment/Plan 1. Abnormal findings on diagnostic imaging of lung -for 3-6 month f/u CT - CT Head Wo Contrast; Future  2. Alcohol dependence, continuous (HCC) -ongoing scotch intake of 2 per night -has been counseled many times to reduce to one max  3. Chronic diastolic (congestive) heart failure (HCC) -stable, edema is nonpitting, recommend compression hose at [facility] knee high  4. Major depressive disorder with single episode, in full remission ([place]) -doing well, monitor  5. Aortic atherosclerosis ([place]) -noted on CT, cont bp, lipid control  6. Atrial fibrillation with RVR (HCC) -cont current rate control and anticoagulation regimen  7. Hypercoagulable state due to atrial fibrillation ([place]) -cont eliquis therapy  Labs/tests ordered:   Orders Placed This Encounter  Procedures  . CT Head Wo Contrast    Standing Status:   Future    Standing Expiration Date:   03/10/2020    Order Specific Question:   ** REASON FOR EXAM (FREE TEXT)    Answer:   abnormal CT lung 3-6 month follow-up from December    Order Specific Question:   Preferred imaging location?    Answer:   [facility]    Order Specific  Question:   Radiology Contrast Protocol - do NOT remove file path    Answer:   [email or web address]    Next appt:  4 mos med mgt, review CT  Jacqualynn Parco L. Ahnaf Caponi, D.O. [facility] [address], [place] [address] Cell  Phone (Mon-Fri 8am-5pm):  [phone] On Call:  [phone] & follow prompts after 5pm & weekends Office Phone:  [phone] Office Fax:  [phone]

## 2018-12-17 ENCOUNTER — Other Ambulatory Visit: Payer: Self-pay | Admitting: Internal Medicine

## 2018-12-30 ENCOUNTER — Other Ambulatory Visit: Payer: Self-pay | Admitting: Internal Medicine

## 2018-12-30 DIAGNOSIS — I10 Essential (primary) hypertension: Secondary | ICD-10-CM

## 2019-01-08 NOTE — Addendum Note (Signed)
Addended by: Despina Hidden on: 01/08/2019 11:36 AM   Modules accepted: Orders

## 2019-01-18 ENCOUNTER — Other Ambulatory Visit: Payer: Self-pay | Admitting: Internal Medicine

## 2019-01-25 ENCOUNTER — Ambulatory Visit (INDEPENDENT_AMBULATORY_CARE_PROVIDER_SITE_OTHER): Payer: Medicare Other | Admitting: Allergy and Immunology

## 2019-01-25 ENCOUNTER — Other Ambulatory Visit: Payer: Self-pay

## 2019-01-25 ENCOUNTER — Encounter: Payer: Self-pay | Admitting: Allergy and Immunology

## 2019-01-25 DIAGNOSIS — J310 Chronic rhinitis: Secondary | ICD-10-CM | POA: Diagnosis not present

## 2019-01-25 DIAGNOSIS — K219 Gastro-esophageal reflux disease without esophagitis: Secondary | ICD-10-CM

## 2019-01-25 DIAGNOSIS — R053 Chronic cough: Secondary | ICD-10-CM

## 2019-01-25 DIAGNOSIS — R05 Cough: Secondary | ICD-10-CM | POA: Diagnosis not present

## 2019-01-25 NOTE — Progress Notes (Signed)
Follow-up Telemedicine Note  RE: [name] MRN: [id number] DOB: 14-May-1933 Date of Telemedicine Visit: 01/25/2019  Primary care provider: Gayland Curry, DO Referring provider: Gayland Curry, DO  Telemedicine Follow Up Visit via Telephone: I connected with [name] for a follow up on 01/25/19 by telephone and verified that I am speaking with the correct person using two identifiers.   The limitations, risks, security and privacy concerns of performing an evaluation and management service by telemedicine, the availability of in person appointments, and that there may be a patient responsible charge related to this service were discussed. The patient expressed understanding and agreed to proceed.  Patient is at home.  Provider is at the office.  Visit start time: 10:48 AM Visit end time: 11:25 AM Insurance consent/check in by: Lelan Pons Medical consent and medical assistant/nurse: Nira Conn  History of present illness: [name] is a 85 y.o. female with history of persistent cough, chronic rhinitis, gastroesophageal reflux, and hypertension presenting today for sick visit.  She was previously seen in this clinic for her initial evaluation on November 30, 2018.  She reports that she continues to experience the "tickle on the right side of the trachea" resulting in a "strong spasmodic cough" which leads to sneezing and excessive rhinorrhea.  She apparently never started nasal saline irrigation and admits that she has not been using azelastine nasal spray.  In addition, she reports that the pharmacist told her that she did not need to take montelukast several weeks ago and so she discontinued this medication as well.  She is currently taking omeprazole 20 mg daily.  She does not complain of fevers, chills, or discolored mucus production.  Assessment and plan: Cough, persistent Most likely multifactorial with contribution from postnasal drainage and acid reflux.  Prednisone will not be  prescribed as the patient does not tolerate the side effects this medication.  I have recommended the use of a flutter valve.  Information for purchasing a flutter valve has been provided.  Nasal saline lavage (NeilMed) has been recommended as needed and prior to medicated nasal sprays along with instructions for proper administration.  For thick post nasal drainage, add guaifenesin [phone] mg (Mucinex)  twice daily as needed with adequate hydration as discussed.  Restart montelukast 10 mg daily at bedtime.  Proper reflux lifestyle modifications have been provided.  Continue omeprazole 20 mg daily.  For now, take famotidine 20 mg to twice daily.  We will regroup in 4-6 weeks to assess progress and make adjustments at that time.  If this problem persists or progresses, otolaryngology referral will be provided to see Dr. Benjamine Mola.  Chronic rhinitis All seasonal and perennial aeroallergens skin tests were negative despite a positive histamine control during her previous visit.  Nasal saline irrigation, guaifenesin (montelukast, and azelastine nasal spray (as above).  Gastroesophageal reflux disease  Appropriate reflux lifestyle modifications, omeprazole, and famotidine (as above).   No orders of the defined types were placed in this encounter.   Diagnostics: None.   Physical examination: Physical Exam Not obtained as encounter was done via telephone.   The following portions of the patient's history were reviewed and updated as appropriate: allergies, current medications, past family history, past medical history, past social history, past surgical history and problem list.  Allergies as of 01/25/2019      Reactions   Mirtazapine    tremor   Nickel Other (See Comments)   inflammation   Other Itching   Acrylic nails, and trace metals  Prednisone    Causes sleep disturbances       Medication List       Accurate as of Jan 25, 2019 12:30 PM. Always use your most recent  med list.        acetaminophen 325 MG tablet Commonly known as:  TYLENOL Take 750-1,000 mg by mouth See admin instructions. Take 1000 mg in the morning and take 750 mg at night   albuterol 108 (90 Base) MCG/ACT inhaler Commonly known as:  VENTOLIN HFA Inhale 2 puffs into the lungs every 6 (six) hours as needed for wheezing or shortness of breath.   allopurinol 100 MG tablet Commonly known as:  ZYLOPRIM TAKE 1 TABLET(100 MG) BY MOUTH DAILY   Azelastine HCl 0.15 % Soln Place 2 sprays into both nostrils 2 (two) times daily as needed.   Bystolic 10 MG tablet Generic drug:  nebivolol TAKE 1 TABLET BY MOUTH DAILY   chlorthalidone 25 MG tablet Commonly known as:  HYGROTON Take 25 mg by mouth daily as needed. (SBP >180 mmHg)   Eliquis 5 MG Tabs tablet Generic drug:  apixaban TAKE 1 TABLET BY MOUTH TWICE DAILY   famotidine 20 MG tablet Commonly known as:  Pepcid Take 1 tablet (20 mg total) by mouth 2 (two) times daily.   flecainide 50 MG tablet Commonly known as:  TAMBOCOR TAKE 1 TABLET BY MOUTH TWICE DAILY FOR ATRIAL FIBRILLATION   furosemide 20 MG tablet Commonly known as:  LASIX Take 1 tablet (20 mg total) by mouth as needed.   HYDROcodone-acetaminophen 5-325 MG tablet Commonly known as:  Norco Take 1 tablet by mouth every 6 (six) hours as needed for moderate pain. One to two tabs every 4-6 hours for pain   irbesartan 75 MG tablet Commonly known as:  AVAPRO TAKE 1 TABLET BY MOUTH DAILY   Melatonin 5 MG Tabs Take 5 mg by mouth at bedtime.   montelukast 10 MG tablet Commonly known as:  Singulair Take 1 tablet (10 mg total) by mouth at bedtime.   NEO-SYNEPHRINE NA Place 1 spray into the nose 2 (two) times daily as needed (congestion).   omeprazole 20 MG capsule Commonly known as:  PriLOSEC Take 1 capsule (20 mg total) by mouth daily.   ondansetron 4 MG tablet Commonly known as:  ZOFRAN Take 1 tablet (4 mg total) by mouth 2 (two) times daily as needed for  nausea.   sodium chloride 0.65 % Soln nasal spray Commonly known as:  OCEAN Place 2 sprays into both nostrils 2 (two) times daily.   zolpidem 5 MG tablet Commonly known as:  AMBIEN TAKE 1 TABLET BY MOUTH AT BEDTIME AS NEEDED FOR SLEEP       Allergies  Allergen Reactions  . Mirtazapine     tremor  . Nickel Other (See Comments)    inflammation  . Other Itching    Acrylic nails, and trace metals  . Prednisone     Causes sleep disturbances    Review of systems: Review of systems negative except as noted in HPI / PMHx or noted below: Constitutional: Negative.  HENT: Negative.   Eyes: Negative.  Respiratory: Negative.   Cardiovascular: Negative.  Gastrointestinal: Negative.  Genitourinary: Negative.  Musculoskeletal: Negative.  Neurological: Negative.  Endo/Heme/Allergies: Negative.  Cutaneous: Negative.  Past Medical History:  Diagnosis Date  . A-fib ([place])   . Abdominal pain, unspecified site   . Abnormality of gait 06/02/2013  . Arthritis   . Cataracts, bilateral   . Depression   .  Diverticulosis of colon (without mention of hemorrhage)   . Diverticulosis of colon (without mention of hemorrhage) 10/27/2012  . Edema 10/27/2012  . GI bleed 06/2002  . Hemorrhage of gastrointestinal tract, unspecified   . History of stomach ulcers   . Hypertension   . Insomnia, unspecified 10/27/2012  . Localized osteoarthrosis not specified whether primary or secondary, lower leg 05/31/2011  . Migraines   . Nausea alone 10/28/2012  . Obesity, unspecified 10/27/2012  . Osteoarthrosis, unspecified whether generalized or localized, lower leg   . Other acariasis    peripherial neuropathy  . Other and unspecified alcohol dependence, continuous drinking behavior 10/28/2012  . Palpitations   . PVC (premature ventricular contraction)   . Unspecified hereditary and idiopathic peripheral neuropathy   . UTI (lower urinary tract infection)    pt state uti w/ no pain    Family History  Problem  Relation Age of Onset  . Breast cancer Mother   . Hypertension Mother   . Hypertension Father   . Allergic rhinitis Neg Hx   . Asthma Neg Hx     Social History   Socioeconomic History  . Marital status: Widowed    Spouse name: Not on file  . Number of children: Not on file  . Years of education: Not on file  . Highest education level: Not on file  Occupational History  . Occupation: retired Marine scientist  Social Needs  . Financial resource strain: Not hard at all  . Food insecurity:    Worry: Never true    Inability: Never true  . Transportation needs:    Medical: No    Non-medical: No  Tobacco Use  . Smoking status: Former Smoker    Packs/day: 1.00    Years: 30.00    Pack years: 30.00    Types: Cigarettes    Last attempt to quit: 10/09/1983    Years since quitting: 35.3  . Smokeless tobacco: Never Used  Substance and Sexual Activity  . Alcohol use: Yes    Alcohol/week: 1.0 - 2.0 standard drinks    Types: 1 - 2 Shots of liquor per week    Comment: 2 DRINKS A DAY - daily  scotch   . Drug use: No  . Sexual activity: Never  Lifestyle  . Physical activity:    Days per week: 0 days    Minutes per session: 0 min  . Stress: Only a little  Relationships  . Social connections:    Talks on phone: Three times a week    Gets together: Three times a week    Attends religious service: Never    Active member of club or organization: No    Attends meetings of clubs or organizations: Never    Relationship status: Widowed  . Intimate partner violence:    Fear of current or ex partner: No    Emotionally abused: No    Physically abused: No    Forced sexual activity: No  Other Topics Concern  . Not on file  Social History Narrative   Lives at [facility], in [place]   Widow -2004   Stopped smoking 1985   POA, DNR, Living Will   [place] with walker   Alcohol scotch daily    Exercise none    Previous notes and tests were reviewed.  I discussed the assessment and treatment plan  with the patient. The patient was provided an opportunity to ask questions and all were answered. The patient agreed with the plan and demonstrated an  understanding of the instructions.   The patient was advised to call back or seek an in-person evaluation if the symptoms worsen or if the condition fails to improve as anticipated.  I provided 37 minutes of non-face-to-face time during this encounter.  I appreciate the opportunity to take part in Arrionna's care. Please do not hesitate to contact me with questions.  Sincerely,   R. Edgar Frisk, MD

## 2019-01-25 NOTE — Assessment & Plan Note (Signed)
   Appropriate reflux lifestyle modifications, omeprazole, and famotidine (as above).

## 2019-01-25 NOTE — Assessment & Plan Note (Signed)
All seasonal and perennial aeroallergens skin tests were negative despite a positive histamine control during her previous visit.  Nasal saline irrigation, guaifenesin (montelukast, and azelastine nasal spray (as above).

## 2019-01-25 NOTE — Patient Instructions (Signed)
Cough, persistent Most likely multifactorial with contribution from postnasal drainage and acid reflux.  Prednisone will not be prescribed as the patient does not tolerate the side effects this medication.  I have recommended the use of a flutter valve.  Information for purchasing a flutter valve has been provided.  Nasal saline lavage (NeilMed) has been recommended as needed and prior to medicated nasal sprays along with instructions for proper administration.  For thick post nasal drainage, add guaifenesin [phone] mg (Mucinex)  twice daily as needed with adequate hydration as discussed.  Restart montelukast 10 mg daily at bedtime.  Proper reflux lifestyle modifications have been provided.  Continue omeprazole 20 mg daily.  For now, take famotidine 20 mg to twice daily.  We will regroup in 4-6 weeks to assess progress and make adjustments at that time.  If this problem persists or progresses, otolaryngology referral will be provided to see Dr. Benjamine Mola.  Chronic rhinitis All seasonal and perennial aeroallergens skin tests were negative despite a positive histamine control during her previous visit.  Nasal saline irrigation, guaifenesin (montelukast, and azelastine nasal spray (as above).  Gastroesophageal reflux disease  Appropriate reflux lifestyle modifications, omeprazole, and famotidine (as above).   Return in about 6 weeks (around 03/08/2019), or if symptoms worsen or fail to improve.

## 2019-01-25 NOTE — Assessment & Plan Note (Signed)
Most likely multifactorial with contribution from postnasal drainage and acid reflux.  Prednisone will not be prescribed as the patient does not tolerate the side effects this medication.  I have recommended the use of a flutter valve.  Information for purchasing a flutter valve has been provided.  Nasal saline lavage (NeilMed) has been recommended as needed and prior to medicated nasal sprays along with instructions for proper administration.  For thick post nasal drainage, add guaifenesin [phone] mg (Mucinex)  twice daily as needed with adequate hydration as discussed.  Restart montelukast 10 mg daily at bedtime.  Proper reflux lifestyle modifications have been provided.  Continue omeprazole 20 mg daily.  For now, take famotidine 20 mg to twice daily.  We will regroup in 4-6 weeks to assess progress and make adjustments at that time.  If this problem persists or progresses, otolaryngology referral will be provided to see Dr. Benjamine Mola.

## 2019-02-18 ENCOUNTER — Other Ambulatory Visit: Payer: Self-pay | Admitting: Internal Medicine

## 2019-02-18 NOTE — Telephone Encounter (Signed)
Verified pharmacy, [facility], patient next appointment 04/14/2019 last appointment 12/09/2018 and last refill was 01/18/2019 for 30 tabs.

## 2019-02-19 NOTE — Telephone Encounter (Addendum)
[name] with [facility] called and stated that the Zolpidem was on backorder at their pharmacy. Requesting to send Rx to [facility] on [address] instead and stated that patient was aware and agreed.  Rx Phoned in

## 2019-03-25 DIAGNOSIS — B351 Tinea unguium: Secondary | ICD-10-CM | POA: Diagnosis not present

## 2019-03-30 ENCOUNTER — Other Ambulatory Visit: Payer: Self-pay | Admitting: Internal Medicine

## 2019-03-30 DIAGNOSIS — R05 Cough: Secondary | ICD-10-CM

## 2019-03-30 DIAGNOSIS — R059 Cough, unspecified: Secondary | ICD-10-CM

## 2019-04-08 ENCOUNTER — Ambulatory Visit
Admission: RE | Admit: 2019-04-08 | Discharge: 2019-04-08 | Disposition: A | Discharge status: Home / Self Care | Payer: Medicare Other | Source: Ambulatory Visit | Attending: Internal Medicine | Admitting: Internal Medicine

## 2019-04-08 ENCOUNTER — Other Ambulatory Visit: Payer: Self-pay

## 2019-04-08 DIAGNOSIS — R918 Other nonspecific abnormal finding of lung field: Secondary | ICD-10-CM | POA: Diagnosis not present

## 2019-04-08 DIAGNOSIS — I712 Thoracic aortic aneurysm, without rupture: Secondary | ICD-10-CM | POA: Diagnosis not present

## 2019-04-08 DIAGNOSIS — J984 Other disorders of lung: Secondary | ICD-10-CM | POA: Diagnosis not present

## 2019-04-14 ENCOUNTER — Non-Acute Institutional Stay: Payer: Medicare Other | Admitting: Internal Medicine

## 2019-04-14 ENCOUNTER — Other Ambulatory Visit: Payer: Self-pay

## 2019-04-14 ENCOUNTER — Encounter: Payer: Self-pay | Admitting: Internal Medicine

## 2019-04-14 VITALS — BP 150/90 | HR 77 | Temp 98.3°F | Ht 67.0 in | Wt 190.0 lb

## 2019-04-14 DIAGNOSIS — I712 Thoracic aortic aneurysm, without rupture, unspecified: Secondary | ICD-10-CM

## 2019-04-14 DIAGNOSIS — G4700 Insomnia, unspecified: Secondary | ICD-10-CM | POA: Diagnosis not present

## 2019-04-14 DIAGNOSIS — R918 Other nonspecific abnormal finding of lung field: Secondary | ICD-10-CM | POA: Diagnosis not present

## 2019-04-14 DIAGNOSIS — I700 Atherosclerosis of aorta: Secondary | ICD-10-CM | POA: Diagnosis not present

## 2019-04-14 DIAGNOSIS — I5032 Chronic diastolic (congestive) heart failure: Secondary | ICD-10-CM | POA: Diagnosis not present

## 2019-04-14 DIAGNOSIS — I872 Venous insufficiency (chronic) (peripheral): Secondary | ICD-10-CM | POA: Diagnosis not present

## 2019-04-14 NOTE — Progress Notes (Signed)
Location:  Occupational psychologist of Service:  Clinic (12)  Provider: Monte Zinni L. Mariea Clonts, D.O., C.M.D.  Goals of Care:  Advanced Directives 08/08/2018  Does Patient Have a Medical Advance Directive? No  Type of Advance Directive -  Does patient want to make changes to medical advance directive? -  Copy of [place] in Chart? -  Would patient like information on creating a medical advance directive? No - Patient declined  Pre-existing out of facility DNR order (yellow form or pink MOST form) -   Chief Complaint  Patient presents with  . Medical Management of Chronic Issues    42mth follow-up, CT Scan    HPI: Patient is a 85 y.o. female seen today for medical management of chronic diseases.  She herself did not have new concerns.    The main topic of the day is her abnormality on her f/u screening CT scan:   1. Slight increase in size in density of ground-glass spiculated nodule in the left upper lobe, image 47/171, sequence 3. 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 2. Stable ground-glass nodule in the more inferior left upper lobe, indeterminate. 3. Stable 5 mm left lower lobe pulmonary nodule, likely benign. 4. Stable fusiform ascending thoracic aortic aneurysm measuring up to 4.6 cm. Ascending thoracic aortic aneurysm. Recommend semi-annual imaging followup by CTA or MRA and referral to cardiothoracic surgery if not already obtained. This recommendation follows 2010  Reviewed findings with her.  She does not want to be very aggressive about treatment if this turns out to be malignant.  She says she does not serve much purpose anymore as she is.  She might be good for a good laugh once in a while.  She doesn't get to see her family.  She has her cat who is 49 yo.    Chronic cough persists despite singulair (reportedly but not on med list?), pepcid and prilosec.  She was previously taken off ace.  Is still on arb.  She  says the cough has been x 10 years.  Interesting, she did not have a cough when in rehab.  She wants to take some cough syrup at night for it.  She already drinks scotch, 2 shots, earlier in the evening which does seem to help.  Still not sleeping well.  Is on 5mg  ambien only which is max recommended in geriatrics.  She is going to take melatonin with it 5mg  and see how that goes.    She has not yet gotten her compression hose for her venous insufficiency as recommended.  Past Medical History:  Diagnosis Date  . A-fib ([place])   . Abdominal pain, unspecified site   . Abnormality of gait 06/02/2013  . Arthritis   . Cataracts, bilateral   . Depression   . Diverticulosis of colon (without mention of hemorrhage)   . Diverticulosis of colon (without mention of hemorrhage) 10/27/2012  . Edema 10/27/2012  . GI bleed 06/2002  . Hemorrhage of gastrointestinal tract, unspecified   . History of stomach ulcers   . Hypertension   . Insomnia, unspecified 10/27/2012  . Localized osteoarthrosis not specified whether primary or secondary, lower leg 05/31/2011  . Migraines   . Nausea alone 10/28/2012  . Obesity, unspecified 10/27/2012  . Osteoarthrosis, unspecified whether generalized or localized, lower leg   . Other acariasis    peripherial neuropathy  . Other and unspecified alcohol dependence, continuous drinking behavior 10/28/2012  . Palpitations   .  PVC (premature ventricular contraction)   . Unspecified hereditary and idiopathic peripheral neuropathy   . UTI (lower urinary tract infection)    pt state uti w/ no pain    Past Surgical History:  Procedure Laterality Date  . ARTHROSCOPIC REPAIR ACL    . BREAST BIOPSY  1973   left  . BREAST REDUCTION SURGERY  12/23/2003  . cataracts    . CHOLECYSTECTOMY  2005  . TONSILLECTOMY AND ADENOIDECTOMY  1941    Allergies  Allergen Reactions  . Mirtazapine     tremor  . Nickel Other (See Comments)    inflammation  . Other Itching    Acrylic nails, and  trace metals  . Prednisone     Causes sleep disturbances     Outpatient Encounter Medications as of 04/14/2019  Medication Sig  . acetaminophen (TYLENOL) 325 MG tablet Take 750-1,000 mg by mouth See admin instructions. Take 1000 mg in the morning and take 750 mg at night  . albuterol (PROVENTIL HFA;VENTOLIN HFA) 108 (90 Base) MCG/ACT inhaler Inhale 2 puffs into the lungs every 6 (six) hours as needed for wheezing or shortness of breath.  . allopurinol (ZYLOPRIM) 100 MG tablet TAKE 1 TABLET BY MOUTH DAILY  . BYSTOLIC 10 MG tablet TAKE 1 TABLET BY MOUTH DAILY  . chlorthalidone (HYGROTON) 25 MG tablet Take 25 mg by mouth daily as needed. (SBP >180 mmHg)   . ELIQUIS 5 MG TABS tablet TAKE 1 TABLET BY MOUTH TWICE DAILY  . famotidine (PEPCID) 20 MG tablet Take 1 tablet (20 mg total) by mouth 2 (two) times daily.  . flecainide (TAMBOCOR) 50 MG tablet TAKE 1 TABLET BY MOUTH TWICE DAILY FOR ATRIAL FIBRILLATION  . HYDROcodone-acetaminophen (NORCO) 5-325 MG tablet Take 1 tablet by mouth every 6 (six) hours as needed for moderate pain. One to two tabs every 4-6 hours for pain  . irbesartan (AVAPRO) 75 MG tablet TAKE 1 TABLET BY MOUTH DAILY  . Melatonin 5 MG TABS Take 5 mg by mouth at bedtime.  Marland Kitchen omeprazole (PRILOSEC) 20 MG capsule Take 1 capsule (20 mg total) by mouth daily.  . ondansetron (ZOFRAN) 4 MG tablet Take 1 tablet (4 mg total) by mouth 2 (two) times daily as needed for nausea.  Marland Kitchen Phenylephrine HCl (NEO-SYNEPHRINE NA) Place 1 spray into the nose 2 (two) times daily as needed (congestion).  . sodium chloride (OCEAN) 0.65 % SOLN nasal spray Place 2 sprays into both nostrils 2 (two) times daily.   Marland Kitchen zolpidem (AMBIEN) 5 MG tablet TAKE 1 TABLET BY MOUTH EVERY NIGHT AT BEDTIME FOR SLEEP  . furosemide (LASIX) 20 MG tablet Take 1 tablet (20 mg total) by mouth as needed.  . [DISCONTINUED] Azelastine HCl 0.15 % SOLN Place 2 sprays into both nostrils 2 (two) times daily as needed. (Patient not taking:  Reported on 01/25/2019)  . [DISCONTINUED] montelukast (SINGULAIR) 10 MG tablet Take 1 tablet (10 mg total) by mouth at bedtime. (Patient not taking: Reported on 01/25/2019)   No facility-administered encounter medications on file as of 04/14/2019.     Review of Systems:  Review of Systems  Constitutional: Positive for malaise/fatigue and weight loss. Negative for chills and fever.       Down 3 lbs since march  HENT: Positive for hearing loss. Negative for congestion.   Eyes: Negative for blurred vision.       Glasses  Respiratory: Negative for cough and shortness of breath.   Cardiovascular: Negative for chest pain, palpitations and leg  swelling.  Gastrointestinal: Negative for abdominal pain, blood in stool, constipation, diarrhea, heartburn and melena.  Genitourinary: Negative for dysuria.  Skin: Negative for itching and rash.  Neurological: Negative for dizziness, sensory change, focal weakness and loss of consciousness.  Endo/Heme/Allergies: Does not bruise/bleed easily.  Psychiatric/Behavioral: Negative for depression and memory loss. The patient has insomnia. The patient is not nervous/anxious.     Health Maintenance  Topic Date Due  . INFLUENZA VACCINE  04/24/2019  . TETANUS/TDAP  09/23/2020  . DEXA SCAN  Completed  . PNA vac Low Risk Adult  Completed    Physical Exam: Vitals:   04/14/19 1107  BP: (!) 150/90  Pulse: 77  Temp: 98.3 F (36.8 C)  TempSrc: Oral  SpO2: 94%  Weight: 190 lb (86.2 kg)  Height: 5\' 7"  (1.702 m)   Body mass index is 29.76 kg/m. Physical Exam Vitals signs reviewed.  Constitutional:      General: She is not in acute distress.    Appearance: Normal appearance. She is normal weight. She is not toxic-appearing.  HENT:     Head: Normocephalic and atraumatic.  Cardiovascular:     Rate and Rhythm: Normal rate. Rhythm irregular.     Heart sounds: Normal heart sounds. No murmur.  Pulmonary:     Effort: Pulmonary effort is normal.     Breath  sounds: Normal breath sounds. No wheezing, rhonchi or rales.  Abdominal:     General: Bowel sounds are normal.  Musculoskeletal: Normal range of motion.     Comments: Uses manual wheelchair for long distances, walker in her home  Skin:    General: Skin is warm and dry.     Comments: Hyperpigmentation of lower legs with edema   Neurological:     General: No focal deficit present.     Mental Status: She is alert and oriented to person, place, and time. Mental status is at baseline.     Cranial Nerves: No cranial nerve deficit.  Psychiatric:        Mood and Affect: Mood normal.     Labs reviewed: Basic Metabolic Panel: Recent Labs    08/08/18 1158 08/09/18 0413  NA 132* 134*  K 3.7 4.3  CL 95* 96*  CO2 26 28  GLUCOSE 130* 110*  BUN 14 16  CREATININE 0.91 0.83  CALCIUM 8.9 8.7*  MG 1.7  --   PHOS 3.2  --    Liver Function Tests: Recent Labs    08/08/18 1158 08/09/18 0413  AST 24 19  ALT 15 13  ALKPHOS 73 65  BILITOT 1.2 0.9  PROT 6.8 5.9*  ALBUMIN 3.8 3.3*   No results for input(s): LIPASE, AMYLASE in the last 8760 hours. No results for input(s): AMMONIA in the last 8760 hours. CBC: Recent Labs    08/08/18 1158 08/09/18 0413 08/10/18 0415  WBC 7.9 6.2 5.1  NEUTROABS 5.6 4.6 3.2  HGB 13.6 12.3 11.6*  HCT 41.4 38.1 36.7  MCV 97.2 99.7 101.1*  PLT 154 179 173   Lipid Panel: No results for input(s): CHOL, HDL, LDLCALC, TRIG, CHOLHDL, LDLDIRECT in the last 8760 hours. Lab Results  Component Value Date   HGBA1C 5.3 10/03/2015    Procedures since last visit: Ct Chest Wo Contrast  Result Date: 04/08/2019 CLINICAL DATA:  Follow-up of lung nodule. EXAM: CT CHEST WITHOUT CONTRAST TECHNIQUE: Multidetector CT imaging of the chest was performed following the standard protocol without IV contrast. COMPARISON:  September 03, 2018 FINDINGS: Cardiovascular: Enlarged heart. Calcific  atherosclerotic disease of the aorta and coronary arteries. Aneurysmal dilation of the  ascending thoracic aorta measuring 4.6 cm. Mediastinum/Nodes: No enlarged mediastinal or axillary lymph nodes. Thyroid gland, trachea, and esophagus demonstrate no significant findings. Lungs/Pleura: Mild upper lobe predominant centrilobular emphysema. Ground-glass spiculated nodule in the left upper lobe measures 1.2 x 1.0 x 1.0 cm, slightly enlarged in size. Image 47/171, sequence 3, axial images and image 73/142, sequence 5, coronal images. Stable accounting for measurement techniques irregular ground-glass nodule in the more inferior left upper lobe extending to the pleura measures 1.4 x 1.7 cm, image 69/171, axial images and image 69/142, coronal images. Scarring within the lingula. 5 mm soft tissue nodule in the posterior left lower lobe, stable image 86/171, sequence 3. Upper Abdomen: No acute abnormality. Musculoskeletal: 6 mm sclerotic focus within the superior endplate of U63 vertebral body, stable. IMPRESSION: 1. Slight increase in size in density of ground-glass spiculated nodule in the left upper lobe, image 47/171, sequence 3. 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 2. Stable ground-glass nodule in the more inferior left upper lobe, indeterminate. 3. Stable 5 mm left lower lobe pulmonary nodule, likely benign. 4. Stable fusiform ascending thoracic aortic aneurysm measuring up to 4.6 cm. Ascending thoracic aortic aneurysm. Recommend semi-annual imaging followup by CTA or MRA and referral to cardiothoracic surgery if not already obtained. This recommendation follows 2010 ACCF/AHA/AATS/ACR/ASA/SCA/SCAI/SIR/STS/SVM Guidelines for the Diagnosis and Management of Patients With Thoracic Aortic Disease. Circulation. 2010; 121: F354-T625. Aortic aneurysm NOS (ICD10-I71.9) Aortic Atherosclerosis (ICD10-I70.0) and Emphysema (ICD10-J43.9). Electronically Signed   By: Fidela Salisbury M.D.   On: 04/08/2019 16:10    Assessment/Plan 1. Abnormal findings on diagnostic imaging of  lung - one nodule is now suspicious for cancer--pt likely needs PET scan and biopsy or resection--she does not want very aggressive therapy; she does have a DNR in place and is a retired Marine scientist - Ambulatory referral to Pulmonology  2. 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 Insomnia, unspecified type -cont melatonin nightly 5mg  and ambien 5mg   4. Chronic diastolic (congestive) heart failure (HCC) -cont irbesartan, bystolic, chlorthalidone, prn lasix  5. Venous insufficiency -again advised elevating feet at rest and wearing compression hose  6. Aortic atherosclerosis ([place]) -noted on CT, cont secondary prevention approaches  7. Thoracic aortic aneurysm without rupture (HCC) -stable on CT above, bp was up today, but likely related to discussion about changing lung nodule  Labs/tests ordered:  Pulmonary consult Next appt:  05/26/2019   Wilmary Levit L. Gael Londo, D.O. [facility] [address], [place] [address] Cell Phone (Mon-Fri 8am-5pm):  [phone] On Call:  [phone] & follow prompts after 5pm & weekends Office Phone:  [phone] Office Fax:  [phone]

## 2019-04-19 ENCOUNTER — Encounter: Payer: Self-pay | Admitting: Allergy and Immunology

## 2019-04-19 ENCOUNTER — Other Ambulatory Visit: Payer: Self-pay

## 2019-04-19 ENCOUNTER — Ambulatory Visit (INDEPENDENT_AMBULATORY_CARE_PROVIDER_SITE_OTHER): Payer: Medicare Other | Admitting: Allergy and Immunology

## 2019-04-19 DIAGNOSIS — R05 Cough: Secondary | ICD-10-CM

## 2019-04-19 DIAGNOSIS — R053 Chronic cough: Secondary | ICD-10-CM

## 2019-04-19 DIAGNOSIS — K219 Gastro-esophageal reflux disease without esophagitis: Secondary | ICD-10-CM

## 2019-04-19 DIAGNOSIS — R9389 Abnormal findings on diagnostic imaging of other specified body structures: Secondary | ICD-10-CM | POA: Diagnosis not present

## 2019-04-19 DIAGNOSIS — J310 Chronic rhinitis: Secondary | ICD-10-CM | POA: Diagnosis not present

## 2019-04-19 MED ORDER — BENZONATATE 100 MG PO CAPS: 100.0000 mg | ORAL_CAPSULE | Freq: Two times a day (BID) | ORAL | 0 refills | Status: DC | PRN

## 2019-04-19 NOTE — Progress Notes (Signed)
Follow-up Telemedicine Note  RE: [name] MRN: [id number] DOB: 01/08/1933 Date of Telemedicine Visit: 04/19/2019  Primary care provider: Gayland Curry, DO Referring provider: Gayland Curry, DO  Telemedicine Follow Up Visit via Telephone: I connected with [name] for a follow up on 04/19/19 by telephone and verified that I am speaking with the correct person using two identifiers.   The limitations, risks, security and privacy concerns of performing an evaluation and management service by telemedicine, the availability of in person appointments, and that there may be a patient responsible charge related to this service were discussed. The patient expressed understanding and agreed to proceed.  Patient is at the nursing home. Provider is at the office.  Visit start time: 11:32 AM Visit end time: 12:07 PM Insurance consent/check in by: Anderson [place] Medical consent and medical assistant/nurse: Kayla  History of present illness: [name] is a 85 y.o. female with persistent cough and chronic rhinitis and gastroesophageal reflux presenting today for follow-up.  She was previously evaluated in this clinic on Jan 25, 2019 via telemedicine.  She reports that her cough has persisted "unchanged" despite multiple medications.  She has decided that she would like to start eliminating medications to shorten her medication list.  She reports that the cough originates from a "tickle on the right side of the throat."  She states that the cough originating as a tickle in the base of her throat has been unchanged over the past 10 years.  She has discontinued azelastine and has not taken albuterol.  She reports that she only has 2 or 3 days left of montelukast before this prescription runs out and she would prefer not to refill this medication.  She admits that she has not use the flutter valve because she was uncertain of how to use it correctly.  She reports that she has received nasal symptom  improvement with nasal saline lavage.  She had a chest CT on July 16 to compare with her previous CT in December 2019.  Based upon changes in the CT scan, she will be consulting with the thoracic surgeon.  Assessment and plan: Cough, persistent The history suggests upper airway cough syndrome plus/minus laryngopharyngeal reflux.  Continue nasal saline lavage.  Continue appropriate reflux lifestyle modifications and omeprazole for now.  Start using the flutter valve as needed.  Instructions for proper use have been discussed.  Given the patient's desire to decrease medications, I have asked that the patient stepdown medications one at a time with an interval of 1 to 2 weeks between medication changes.  I have also encouraged her to keep a journal to help determine which medications have been of benefit.  A prescription has been provided for benzonatate 100 mg twice daily if needed.  Consider further evaluation by otolaryngology, Dr. Benjamine Mola.  Abnormal CT of the chest  Given the chest CT changes from December 2019 to July 2020, the patient will be consulting with a thoracic surgeon.   Meds ordered this encounter  Medications   benzonatate (TESSALON PERLES) 100 MG capsule    Sig: Take 1 capsule (100 mg total) by mouth 2 (two) times daily as needed for cough.    Dispense:  20 capsule    Refill:  0    Diagnostics: None.   Physical examination: Physical Exam Not obtained as encounter was done via telephone.   The following portions of the patient's history were reviewed and updated as appropriate: allergies, current medications, past family history, past  medical history, past social history, past surgical history and problem list.  Allergies as of 04/19/2019      Reactions   Mirtazapine    tremor   Nickel Other (See Comments)   inflammation   Other Itching   Acrylic nails, and trace metals   Prednisone    Causes sleep disturbances       Medication List       Accurate as  of April 19, 2019  1:20 PM. If you have any questions, ask your nurse or doctor.        acetaminophen 325 MG tablet Commonly known as: TYLENOL Take 750-1,000 mg by mouth See admin instructions. Take 1000 mg in the morning and take 750 mg at night   albuterol 108 (90 Base) MCG/ACT inhaler Commonly known as: VENTOLIN HFA Inhale 2 puffs into the lungs every 6 (six) hours as needed for wheezing or shortness of breath.   allopurinol 100 MG tablet Commonly known as: ZYLOPRIM TAKE 1 TABLET BY MOUTH DAILY   benzonatate 100 MG capsule Commonly known as: Tessalon Perles Take 1 capsule (100 mg total) by mouth 2 (two) times daily as needed for cough. Started by: Edmonia Lynch, MD   Bystolic 10 MG tablet Generic drug: nebivolol TAKE 1 TABLET BY MOUTH DAILY   chlorthalidone 25 MG tablet Commonly known as: HYGROTON Take 25 mg by mouth daily as needed. (SBP >180 mmHg)   Eliquis 5 MG Tabs tablet Generic drug: apixaban TAKE 1 TABLET BY MOUTH TWICE DAILY   famotidine 20 MG tablet Commonly known as: Pepcid Take 1 tablet (20 mg total) by mouth 2 (two) times daily.   flecainide 50 MG tablet Commonly known as: TAMBOCOR TAKE 1 TABLET BY MOUTH TWICE DAILY FOR ATRIAL FIBRILLATION   furosemide 20 MG tablet Commonly known as: LASIX Take 1 tablet (20 mg total) by mouth as needed.   HYDROcodone-acetaminophen 5-325 MG tablet Commonly known as: Norco Take 1 tablet by mouth every 6 (six) hours as needed for moderate pain. One to two tabs every 4-6 hours for pain   irbesartan 75 MG tablet Commonly known as: AVAPRO TAKE 1 TABLET BY MOUTH DAILY   Melatonin 5 MG Tabs Take 5 mg by mouth at bedtime.   NEO-SYNEPHRINE NA Place 1 spray into the nose 2 (two) times daily as needed (congestion).   omeprazole 20 MG capsule Commonly known as: PriLOSEC Take 1 capsule (20 mg total) by mouth daily.   ondansetron 4 MG tablet Commonly known as: ZOFRAN Take 1 tablet (4 mg total) by mouth 2 (two) times  daily as needed for nausea.   sodium chloride 0.65 % Soln nasal spray Commonly known as: OCEAN Place 2 sprays into both nostrils 2 (two) times daily.   zolpidem 5 MG tablet Commonly known as: AMBIEN TAKE 1 TABLET BY MOUTH EVERY NIGHT AT BEDTIME FOR SLEEP       Allergies  Allergen Reactions   Mirtazapine     tremor   Nickel Other (See Comments)    inflammation   Other Itching    Acrylic nails, and trace metals   Prednisone     Causes sleep disturbances     Previous notes and tests were reviewed.  I discussed the assessment and treatment plan with the patient. The patient was provided an opportunity to ask questions and all were answered. The patient agreed with the plan and demonstrated an understanding of the instructions.   The patient was advised to call back or seek an  in-person evaluation if the symptoms worsen or if the condition fails to improve as anticipated.  I provided 25 minutes of non-face-to-face time during this encounter.  I appreciate the opportunity to take part in Rogelio's care. Please do not hesitate to contact me with questions.  Sincerely,   R. Edgar Frisk, MD

## 2019-04-19 NOTE — Assessment & Plan Note (Signed)
   Given the chest CT changes from December 2019 to July 2020, the patient will be consulting with a thoracic surgeon.

## 2019-04-19 NOTE — Patient Instructions (Addendum)
Cough, persistent The history suggests upper airway cough syndrome plus/minus laryngopharyngeal reflux.  Continue nasal saline lavage.  Continue appropriate reflux lifestyle modifications and omeprazole for now.  Start using the flutter valve as needed.  Instructions for proper use have been discussed.  Given the patient's desire to decrease medications, I have asked that the patient stepdown medications one at a time with an interval of 1 to 2 weeks between medication changes.  I have also encouraged her to keep a journal to help determine which medications have been of benefit.  A prescription has been provided for benzonatate 100 mg twice daily if needed.  Consider further evaluation by otolaryngology, Dr. Benjamine Mola.  Abnormal CT of the chest  Given the chest CT changes from December 2019 to July 2020, the patient will be consulting with a thoracic surgeon.   Return in about 5 months (around 09/19/2019), or if symptoms worsen or fail to improve.

## 2019-04-19 NOTE — Assessment & Plan Note (Addendum)
The history suggests upper airway cough syndrome plus/minus laryngopharyngeal reflux.  Continue nasal saline lavage.  Continue appropriate reflux lifestyle modifications and omeprazole for now.  Start using the flutter valve as needed.  Instructions for proper use have been discussed.  Given the patient's desire to decrease medications, I have asked that the patient stepdown medications one at a time with an interval of 1 to 2 weeks between medication changes.  I have also encouraged her to keep a journal to help determine which medications have been of benefit.  A prescription has been provided for benzonatate 100 mg twice daily if needed.  Consider further evaluation by otolaryngology, Dr. Benjamine Mola.

## 2019-05-05 ENCOUNTER — Ambulatory Visit (INDEPENDENT_AMBULATORY_CARE_PROVIDER_SITE_OTHER): Payer: Medicare Other | Admitting: Internal Medicine

## 2019-05-05 ENCOUNTER — Other Ambulatory Visit: Payer: Self-pay

## 2019-05-05 ENCOUNTER — Encounter: Payer: Self-pay | Admitting: Internal Medicine

## 2019-05-05 DIAGNOSIS — R9389 Abnormal findings on diagnostic imaging of other specified body structures: Secondary | ICD-10-CM | POA: Diagnosis not present

## 2019-05-05 DIAGNOSIS — R053 Chronic cough: Secondary | ICD-10-CM

## 2019-05-05 DIAGNOSIS — R05 Cough: Secondary | ICD-10-CM

## 2019-05-05 NOTE — Patient Instructions (Addendum)
For drainage / throat tickle try take CHLORPHENIRAMINE  4 mg  (Chlortab 4mg )  at [employer] should be easiest to find in the green box)  take one every 4 hours as needed - available over the counter- may cause drowsiness so start with one dose or two one before  Bedtime  and see how you tolerate it before trying in daytime    I will let Dr Mariea Clonts know that the only way to be sure about the spot on your lung as this point is an excisional biopsy as  there's no way of knowing how it's going to behave without getting a really good sample of it.   Pulmonary follow up is as needed since the cough is being treated by [facility].

## 2019-05-05 NOTE — Progress Notes (Addendum)
Subjective:    Patient ID: [name], female   DOB: [birth year]     MRN: [id number]    Brief patient profile:  80 yowf quit smoking in 1985 fine at the time retired Scientist, research (physical sciences) now a widow living independently at [facility] with cough x 2013 referred to pulmonary clinic 05/23/2017 by Sherrie Mustache for Dr Jose Persia.   History of Present Illness  05/23/2017 1st [facility] office visit/ [name]   Chief Complaint  Patient presents with  . Pulmonary Consult    Referred by Dr. Sherrie Mustache. Pt c/o cough x 5 years, worse x 1 1/2 yrs. Her cough comes and goes throughout the day and sometimes wakes her up in the night.   living at [facility] since 2001 new appt since 2004 and cough onset 2013 daily / dry hack only improved when moved to SNF temporarily ? If held cozar during that admit?  Doe x 150 ft flat slow pace with walker x 2 years no recent change  No noct sob but cough most every noct after asleep  Cough occurs in fits then starts sneezing with lot of nasal discharge lots of saline occ neo much better in rehab  No allergy or sinus eval to date    Kouffman Reflux v Neurogenic Cough Differentiator Reflux Comments  Do you awaken from a sound sleep coughing violently?                            With trouble breathing? Yes avg one per night    Do you have choking episodes when you cannot  Get enough air, gasping for air ?              No    Do you usually cough when you lie down into  The bed, or when you just lie down to rest ?                          No    Do you usually cough after meals or eating?         no   Do you cough when (or after) you bend over?    no   GERD SCORE     Kouffman Reflux v Neurogenic Cough Differentiator Neurogenic   Do you more-or-less cough all day long? sporadic   Does change of temperature make you cough? No    Does laughing or chuckling cause you to cough? no   Do fumes (perfume, automobile fumes, burned  Toast, etc.,) cause you to cough ?       no   Does speaking, singing, or talking on the phone cause you to cough   ?               Sometimes    Neurogenic/Airway score      Tried ppi before supper x maybe 30 days  No better rec Dymista one twice daily each nostril x one month  Pepcid 20 mg at bedtime over the counter  For drainage / throat tickle try take CHLORPHENIRAMINE  4 mg - take one every 4 hours as needed - available over the counter- may cause drowsiness so start with just a bedtime dose or two and see how you tolerate it before trying in daytime   Stop losartan and replace with ibesartan 75 mg daily  GERD diet    06/27/2017  f/u ov/[name] re: cough  x 2013  Chief Complaint  Patient presents with  . Follow-up    Pt states her cough has improved some. She is having alot of clear nasal d/c.   after cough/ sneeze tends to have watery drainage  Sleeps fine now s noct cough p 1st gen H1 blockers at hs but only takes one all day, usually in am Cough Tends to peak in early afternoon  Denies  limited by breathing from desired activities  / very sedentary - lives in independent living but food is delivered, she does not walk to cafeteria  rec Add Pantoprazole Take 30-60 min before first meal of the day  GERD diet   For drainage / throat tickle try take CHLORPHENIRAMINE  4 mg - take one every 4 hours as needed - available over the counter- may cause drowsiness so start with just a bedtime dose or two and see how you tolerate it before trying in daytime   Change toprol (metaprolol) one  Half twice daily      07/25/2017  f/u ov/[name] re: cough x 2013 slt improved  Chief Complaint  Patient presents with  . Follow-up    Pt states cough "may be a little better".   not using ppi or dymsta consistently as rec  Noct cough eliminated, just having the daytime sporadic cough non prod s sob rec Pantoprazole 40 mg  Take 30-60 min before first meal of the day and continue prilosec 30 min before your last  GERD diet  For drainage /  throat tickle try take CHLORPHENIRAMINE  4 mg - take one every 4 hours as needed - available over the counter- may cause drowsiness so start with just a bedtime dose or two and see how you tolerate it before trying in daytime   05/05/2019    ov/[name] re: cough x 2013  Consult new problem = MPNs  Chief Complaint  Patient presents with  . Pulmonary Consult    Referred by Dr Mariea Clonts for eval of pulmonary nodules.   Dyspnea:  Able to walk around the house on a walker very slow pace Cough: 24/7  Worse since summer 2019 [facility] eval  Sleeping: tickle in throat keeps her up at night  SABA use: none > albuterol did not help  02: none   No obvious day to day or daytime variability or assoc excess/ purulent sputum or mucus plugs or hemoptysis or cp or chest tightness, subjective wheeze or overt sinus or hb symptoms.   Sleeping  without nocturnal  or early am exacerbation  of respiratory  c/o's or need for noct saba. Also denies any obvious fluctuation of symptoms with weather or environmental changes or other aggravating or alleviating factors except as outlined above   No unusual exposure hx or h/o childhood pna/ asthma or knowledge of premature birth.  Current Allergies, Complete Past Medical History, Past Surgical History, Family History, and Social History were reviewed in [employer] record.  ROS  The following are not active complaints unless bolded Hoarseness, sore throat, dysphagia, dental problems, itching, sneezing,  nasal congestion or discharge of excess mucus or purulent secretions, ear ache,   fever, chills, sweats, unintended wt loss or wt gain, classically pleuritic or exertional cp,  orthopnea pnd or arm/hand swelling  or leg swelling worse w/o elastic hose, presyncope, palpitations, abdominal pain, anorexia, nausea, vomiting, diarrhea  or change in bowel habits or change in bladder habits, change in stools or change in urine, dysuria, hematuria,  rash, arthralgias,  visual complaints, headache, numbness, weakness or ataxia or problems with walking/uses walker or coordination,  change in mood or  memory.        Current Meds  Medication Sig  . acetaminophen (TYLENOL) 325 MG tablet Take 750-1,000 mg by mouth See admin instructions. Take 1000 mg in the morning and take 750 mg at night  . allopurinol (ZYLOPRIM) 100 MG tablet TAKE 1 TABLET BY MOUTH DAILY  . benzonatate (TESSALON PERLES) 100 MG capsule Take 1 capsule (100 mg total) by mouth 2 (two) times daily as needed for cough.  . BYSTOLIC 10 MG tablet TAKE 1 TABLET BY MOUTH DAILY  . chlorthalidone (HYGROTON) 25 MG tablet Take 25 mg by mouth daily as needed. (SBP >180 mmHg)   . ELIQUIS 5 MG TABS tablet TAKE 1 TABLET BY MOUTH TWICE DAILY  . famotidine (PEPCID) 20 MG tablet Take 1 tablet (20 mg total) by mouth 2 (two) times daily. (Patient taking differently: Take 20 mg by mouth at bedtime. )  . flecainide (TAMBOCOR) 50 MG tablet TAKE 1 TABLET BY MOUTH TWICE DAILY FOR ATRIAL FIBRILLATION  . HYDROcodone-acetaminophen (NORCO) 5-325 MG tablet Take 1 tablet by mouth every 6 (six) hours as needed for moderate pain. One to two tabs every 4-6 hours for pain  . irbesartan (AVAPRO) 75 MG tablet TAKE 1 TABLET BY MOUTH DAILY  . Melatonin 5 MG TABS Take 5 mg by mouth at bedtime.  . ondansetron (ZOFRAN) 4 MG tablet Take 1 tablet (4 mg total) by mouth 2 (two) times daily as needed for nausea.  Marland Kitchen Phenylephrine HCl (NEO-SYNEPHRINE NA) Place 1 spray into the nose 2 (two) times daily as needed (congestion).  . sodium chloride (OCEAN) 0.65 % SOLN nasal spray Place 2 sprays into both nostrils 2 (two) times daily.   Marland Kitchen zolpidem (AMBIEN) 5 MG tablet TAKE 1 TABLET BY MOUTH EVERY NIGHT AT BEDTIME FOR SLEEP                 Objective:   Physical Exam   W/c bound due to knees   05/05/2019        193  07/27/2017        184  06/27/2017        185  05/23/17 181 lb (82.1 kg)  05/19/17 180 lb 12.8 oz (82 kg)  04/29/17 183 lb (83  kg)      Vital signs reviewed - Note on arrival 02 sats  94% on RA     HEENT: nl dentition, turbinates bilaterally, and oropharynx. Nl external ear canals without cough reflex   NECK :  without JVD/Nodes/TM/ nl carotid upstrokes bilaterally   LUNGS: no acc muscle use,  Nl contour chest which is clear to A and P bilaterally without cough on insp or exp maneuvers   CV:  RRR  no s3 or murmur or increase in P2, and 1+ pitting edema  both le's  ABD:  soft and nontender with nl inspiratory excursion in the supine position. No bruits or organomegaly appreciated, bowel sounds nl  MS:    ext warm without deformities, calf tenderness, cyanosis or clubbing   SKIN: warm and dry without lesions    NEURO:  alert, approp, nl sensorium with  no motor or cerebellar deficits apparent.     I personally reviewed images and agree with radiology impression as follows:   Chest CT 04/08/2019 1. Slight increase in size in density of ground-glass spiculated nodule in the left upper lobe, image 47/171, sequence  3. 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 2. Stable ground-glass nodule in the more inferior left upper lobe, indeterminate. 3. Stable 5 mm left lower lobe pulmonary nodule, likely benign. 4. Stable fusiform ascending thoracic aortic aneurysm measuring up to 4.6 cm. Ascending thoracic aortic aneurysm. Recommend semi-annual     Assessment:

## 2019-05-06 ENCOUNTER — Encounter: Payer: Self-pay | Admitting: Internal Medicine

## 2019-05-06 NOTE — Assessment & Plan Note (Signed)
Chest CT 04/08/2019 1. Slight increase in size in density of ground-glass spiculated nodule in the left upper lobe, image 47/171, sequence 3. 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 2. Stable ground-glass nodule in the more inferior left upper lobe, indeterminate. 3. Stable 5 mm left lower lobe pulmonary nodule, likely benign. 4. Stable fusiform ascending thoracic aortic aneurysm measuring up to 4.6 cm. Ascending thoracic aortic aneurysm.   She is not a great surgical candidate and BAC is best treated with lobectomy no RT or Chemo nor is it accurately assessed with PET so it's really a tough call at this point as to what to offer her as she is clearly ambivalent about surgery.   Her TA is also of concern and will need to be followed by T surgery but strongly suspect she's not a candidate for surgery on this if not able to offer surgery for the GG nodule so best to sit down with T surgery to discuss limited options for both issues.  The other option is a palliative approach given her overall geriactric decline which to me seems more advanced at this point than one would expect for a normal 85 year old but I did not specifically address this issue with pt as Dr Mariea Clonts is in best position to do so.

## 2019-05-06 NOTE — Assessment & Plan Note (Signed)
Onset 2013  - Trial off losartan 05/23/2017 and on 1st gen h1/ dymista > improved 06/27/2017 50%  - Allergy profile 05/23/2017 >  Eos 0.1/  IgE  19  RAST neg - added ppi qam 06/27/2017 and increase daytime 1st gen H1 blockers per guidelines  > but did not follow instructions consistently  - CT sinus  08/01/2017 1. Evidence of chronic right maxillary sinusitis which is unchanged since 2017. 2. But other sinuses are clear and there is no superimposed acute inflammation identified; no CT evidence of acute sinusitis.  In retrospect this cough (which clearly predates the "micro" nodule x over 5 years is most likely  to represent Upper airway cough syndrome (previously labeled PNDS),  is so named because it's frequently impossible to sort out how much is  CR/sinusitis with freq throat clearing (which can be related to primary GERD)   vs  causing  secondary (" extra esophageal")  GERD from wide swings in gastric pressure that occur with throat clearing, often  promoting self use of mint and menthol lozenges that reduce the lower esophageal sphincter tone and exacerbate the problem further in a cyclical fashion.   These are the same pts (now being labeled as having "irritable larynx syndrome" by some cough centers) who not infrequently have a history of having failed to tolerate ace inhibitors,  dry powder inhalers or biphosphonates or report having atypical/extraesophageal reflux symptoms that don't respond to standard doses of PPI  and are easily confused as having aecopd or asthma flares by even experienced allergists/ pulmonologists (myself included).   rec 1st gen H1 blockers per guidelines  And f/u with Dr Starling Manns as planned    Total time devoted to counseling  > 50 % of initial 60 min office visit:  review case with pt/ discussion of options/alternatives/ personally creating written customized instructions  in presence of pt  then going over those specific  Instructions directly with the pt including how to  use all of the meds but in particular covering each new medication in detail and the difference between the maintenance= "automatic" meds and the prns using an action plan format for the latter (If this problem/symptom => do that organization reading Left to right).  Please see AVS from this visit for a full list of these instructions which I personally wrote for this pt and  are unique to this visit.

## 2019-05-26 ENCOUNTER — Other Ambulatory Visit: Payer: Self-pay

## 2019-05-26 ENCOUNTER — Encounter: Payer: Self-pay | Admitting: Internal Medicine

## 2019-05-26 ENCOUNTER — Non-Acute Institutional Stay: Payer: Medicare Other | Admitting: Internal Medicine

## 2019-05-26 VITALS — BP 140/70 | HR 58 | Temp 98.5°F | Ht 64.0 in | Wt 190.0 lb

## 2019-05-26 DIAGNOSIS — R918 Other nonspecific abnormal finding of lung field: Secondary | ICD-10-CM | POA: Diagnosis not present

## 2019-05-26 DIAGNOSIS — R05 Cough: Secondary | ICD-10-CM | POA: Diagnosis not present

## 2019-05-26 DIAGNOSIS — I700 Atherosclerosis of aorta: Secondary | ICD-10-CM

## 2019-05-26 DIAGNOSIS — I480 Paroxysmal atrial fibrillation: Secondary | ICD-10-CM

## 2019-05-26 DIAGNOSIS — I5032 Chronic diastolic (congestive) heart failure: Secondary | ICD-10-CM | POA: Diagnosis not present

## 2019-05-26 DIAGNOSIS — I872 Venous insufficiency (chronic) (peripheral): Secondary | ICD-10-CM

## 2019-05-26 DIAGNOSIS — M109 Gout, unspecified: Secondary | ICD-10-CM

## 2019-05-26 DIAGNOSIS — R053 Chronic cough: Secondary | ICD-10-CM

## 2019-05-26 DIAGNOSIS — F1020 Alcohol dependence, uncomplicated: Secondary | ICD-10-CM

## 2019-05-26 DIAGNOSIS — Z7189 Other specified counseling: Secondary | ICD-10-CM

## 2019-05-26 DIAGNOSIS — R739 Hyperglycemia, unspecified: Secondary | ICD-10-CM

## 2019-05-26 NOTE — Progress Notes (Signed)
Location:  Occupational psychologist of Service:  Clinic (12)  Provider: Jevante Hollibaugh L. Mariea Clonts, D.O., C.M.D.  Code Status: DNR Goals of Care:  Advanced Directives 08/08/2018  Does Patient Have a Medical Advance Directive? No  Type of Advance Directive -  Does patient want to make changes to medical advance directive? -  Copy of [place] in Chart? -  Would patient like information on creating a medical advance directive? No - Patient declined  Pre-existing out of facility DNR order (yellow form or pink MOST form) -     Chief Complaint  Patient presents with  . Medical Management of Chronic Issues    6 week follow-up    HPI: Patient is a 85 y.o. female seen today for medical management of chronic diseases.    Cough:  Finishing pepcid today.  No longer taking tessalon perles.  Had seen Dr. Melvyn Novas on 8/12.  Has chlorpheniramine prn.  Losartan was stopped and replaced by irbesartan.  bp is a little better now coincidentally.  GERD diet was discussed there, too.  She has her old 10-year cough, tickle.  Then she has a more violent "I must cough" then ends up in a sneezing fit.    She also has had a suspicious lung nodule for bronchoalveolar ca.  Dr. Melvyn Novas did not think she was a good candidate for surgery (lobectomy).  He recommended a palliative approach rather than intensive investigation and treatment.    She has opted not to do compression hose.  Has some mild edema.  She's tried to use [employer] compression hose and they're miserable.    She does not have the appetite she once did.  She still has her taste for scotch, but not  She has no other concerns.   Past Medical History:  Diagnosis Date  . A-fib ([place])   . Abdominal pain, unspecified site   . Abnormality of gait 06/02/2013  . Arthritis   . Cataracts, bilateral   . Depression   . Diverticulosis of colon (without mention of hemorrhage)   . Diverticulosis of colon (without mention of hemorrhage)  10/27/2012  . Edema 10/27/2012  . GI bleed 06/2002  . Hemorrhage of gastrointestinal tract, unspecified   . History of stomach ulcers   . Hypertension   . Insomnia, unspecified 10/27/2012  . Localized osteoarthrosis not specified whether primary or secondary, lower leg 05/31/2011  . Migraines   . Nausea alone 10/28/2012  . Obesity, unspecified 10/27/2012  . Osteoarthrosis, unspecified whether generalized or localized, lower leg   . Other acariasis    peripherial neuropathy  . Other and unspecified alcohol dependence, continuous drinking behavior 10/28/2012  . Palpitations   . PVC (premature ventricular contraction)   . Unspecified hereditary and idiopathic peripheral neuropathy   . UTI (lower urinary tract infection)    pt state uti w/ no pain    Past Surgical History:  Procedure Laterality Date  . ARTHROSCOPIC REPAIR ACL    . BREAST BIOPSY  1973   left  . BREAST REDUCTION SURGERY  12/23/2003  . cataracts    . CHOLECYSTECTOMY  2005  . TONSILLECTOMY AND ADENOIDECTOMY  1941    Allergies  Allergen Reactions  . Mirtazapine     tremor  . Nickel Other (See Comments)    inflammation  . Other Itching    Acrylic nails, and trace metals  . Prednisone     Causes sleep disturbances     Outpatient Encounter Medications as of  05/26/2019  Medication Sig  . acetaminophen (TYLENOL) 325 MG tablet Take 750-1,000 mg by mouth See admin instructions. Take 1000 mg in the morning and take 750 mg at night  . allopurinol (ZYLOPRIM) 100 MG tablet TAKE 1 TABLET BY MOUTH DAILY  . BYSTOLIC 10 MG tablet TAKE 1 TABLET BY MOUTH DAILY  . chlorthalidone (HYGROTON) 25 MG tablet Take 25 mg by mouth daily as needed. (SBP >180 mmHg)   . ELIQUIS 5 MG TABS tablet TAKE 1 TABLET BY MOUTH TWICE DAILY  . flecainide (TAMBOCOR) 50 MG tablet TAKE 1 TABLET BY MOUTH TWICE DAILY FOR ATRIAL FIBRILLATION  . HYDROcodone-acetaminophen (NORCO) 5-325 MG tablet Take 1 tablet by mouth every 6 (six) hours as needed for moderate pain. One to  two tabs every 4-6 hours for pain  . irbesartan (AVAPRO) 75 MG tablet TAKE 1 TABLET BY MOUTH DAILY  . Melatonin 5 MG TABS Take 5 mg by mouth at bedtime.  . ondansetron (ZOFRAN) 4 MG tablet Take 1 tablet (4 mg total) by mouth 2 (two) times daily as needed for nausea.  Marland Kitchen Phenylephrine HCl (NEO-SYNEPHRINE NA) Place 1 spray into the nose 2 (two) times daily as needed (congestion).  . sodium chloride (OCEAN) 0.65 % SOLN nasal spray Place 2 sprays into both nostrils 2 (two) times daily.   Marland Kitchen zolpidem (AMBIEN) 5 MG tablet TAKE 1 TABLET BY MOUTH EVERY NIGHT AT BEDTIME FOR SLEEP  . furosemide (LASIX) 20 MG tablet Take 1 tablet (20 mg total) by mouth as needed.  . [DISCONTINUED] benzonatate (TESSALON PERLES) 100 MG capsule Take 1 capsule (100 mg total) by mouth 2 (two) times daily as needed for cough.  . [DISCONTINUED] famotidine (PEPCID) 20 MG tablet Take 1 tablet (20 mg total) by mouth 2 (two) times daily. (Patient taking differently: Take 20 mg by mouth at bedtime. )   No facility-administered encounter medications on file as of 05/26/2019.     Review of Systems:  Review of Systems  Constitutional: Positive for malaise/fatigue. Negative for chills and fever.  HENT: Negative for congestion and hearing loss.   Eyes: Negative for blurred vision.       Glasses  Respiratory: Positive for cough and sputum production. Negative for shortness of breath and wheezing.   Cardiovascular: Positive for leg swelling. Negative for chest pain and palpitations.  Gastrointestinal: Negative for abdominal pain, blood in stool, constipation, diarrhea and melena.  Genitourinary: Negative for dysuria.  Musculoskeletal: Negative for falls.  Skin: Negative for itching and rash.  Neurological: Negative for dizziness and loss of consciousness.  Endo/Heme/Allergies: Bruises/bleeds easily.  Psychiatric/Behavioral: Negative for depression and memory loss. The patient is not nervous/anxious and does not have insomnia.      Health Maintenance  Topic Date Due  . INFLUENZA VACCINE  04/24/2019  . TETANUS/TDAP  09/23/2020  . DEXA SCAN  Completed  . PNA vac Low Risk Adult  Completed    Physical Exam: Vitals:   05/26/19 1059  BP: 140/70  Pulse: (!) 58  Temp: 98.5 F (36.9 C)  TempSrc: Oral  SpO2: 96%  Weight: 190 lb (86.2 kg)  Height: 5\' 4"  (1.626 m)   Body mass index is 32.61 kg/m. Physical Exam Vitals signs reviewed.  Constitutional:      General: She is not in acute distress.    Appearance: Normal appearance. She is not toxic-appearing.  HENT:     Head: Normocephalic and atraumatic.  Eyes:     Comments: glasses  Cardiovascular:     Rate  and Rhythm: Normal rate and regular rhythm.     Pulses: Normal pulses.     Heart sounds: Normal heart sounds.  Pulmonary:     Effort: Pulmonary effort is normal.     Breath sounds: Normal breath sounds. No stridor. No wheezing or rales.  Abdominal:     General: Bowel sounds are normal. There is no distension.     Palpations: Abdomen is soft. There is no mass.     Tenderness: There is no abdominal tenderness.  Musculoskeletal: Normal range of motion.     Right lower leg: Edema present.     Left lower leg: Edema present.     Comments: Left more swollen than right, refusing compression socks  Skin:    General: Skin is warm and dry.     Capillary Refill: Capillary refill takes less than 2 seconds.  Neurological:     General: No focal deficit present.     Mental Status: She is alert and oriented to person, place, and time. Mental status is at baseline.  Psychiatric:        Mood and Affect: Mood normal.     Labs reviewed: Basic Metabolic Panel: Recent Labs    08/08/18 1158 08/09/18 0413  NA 132* 134*  K 3.7 4.3  CL 95* 96*  CO2 26 28  GLUCOSE 130* 110*  BUN 14 16  CREATININE 0.91 0.83  CALCIUM 8.9 8.7*  MG 1.7  --   PHOS 3.2  --    Liver Function Tests: Recent Labs    08/08/18 1158 08/09/18 0413  AST 24 19  ALT 15 13  ALKPHOS  73 65  BILITOT 1.2 0.9  PROT 6.8 5.9*  ALBUMIN 3.8 3.3*   No results for input(s): LIPASE, AMYLASE in the last 8760 hours. No results for input(s): AMMONIA in the last 8760 hours. CBC: Recent Labs    08/08/18 1158 08/09/18 0413 08/10/18 0415  WBC 7.9 6.2 5.1  NEUTROABS 5.6 4.6 3.2  HGB 13.6 12.3 11.6*  HCT 41.4 38.1 36.7  MCV 97.2 99.7 101.1*  PLT 154 179 173   Lipid Panel: No results for input(s): CHOL, HDL, LDLCALC, TRIG, CHOLHDL, LDLDIRECT in the last 8760 hours. Lab Results  Component Value Date   HGBA1C 5.3 10/03/2015     Assessment/Plan 1. Multiple lung nodules on CT and advance care planning--17 minutes spent on ACP -with one of particular concern that looks like bronchogenic ca -not a surgical candidate for open biopsy  -pulmonary advised she could live 5-10 yrs with this w/o any intervention -she reports life is so dull she doesn't want to live that long, but does not see a reason to go through a major surgery if that's the case -we opted to monitor annually rather than more often and we can always treat her palliatively if symptoms develop  2. Chronic cough -reports no better, but didn't cough at all during visit -cont irbesartan in place of losartan and current regimen per pulmonary  3. Chronic diastolic (congestive) heart failure (HCC) -no signs of acute exacerbation, edema did not respond to lasix (due to venous insufficiency isntead)  4. Paroxysmal atrial fibrillation (HCC) -rate is controlled today, cont bystolic; cont eliquis for anticoagulation  5. Alcohol dependence, continuous ([place]) -continues with scotch (2 per night) and does not appear this will change--not interested in cutting back or quitting--enjoys her drinks while food no longer tastes great  6. Venous insufficiency -recommended compression hose, but she reports they were too much of a  struggle when attempted so she declines  Labs/tests ordered:   Lab Orders     CBC with  Differential/Platelet     COMPLETE METABOLIC PANEL WITH GFR     Lipid panel     Hemoglobin A1c     Uric Acid Fasting labs at [facility]    Next appt:  09/29/2019 med mgt at [place]. Zykera Abella, D.O. [facility] [address], [place] [address] Cell Phone (Mon-Fri 8am-5pm):  [phone] On Call:  [phone] & follow prompts after 5pm & weekends Office Phone:  [phone] Office Fax:  [phone]

## 2019-06-29 ENCOUNTER — Other Ambulatory Visit: Payer: Self-pay | Admitting: *Deleted

## 2019-06-29 DIAGNOSIS — I10 Essential (primary) hypertension: Secondary | ICD-10-CM

## 2019-06-29 MED ORDER — FLECAINIDE ACETATE 50 MG PO TABS: ORAL_TABLET | ORAL | 1 refills | Status: DC

## 2019-06-29 MED ORDER — NEBIVOLOL HCL 10 MG PO TABS: 10.0000 mg | ORAL_TABLET | Freq: Every day | ORAL | 1 refills | Status: DC

## 2019-06-29 MED ORDER — APIXABAN 5 MG PO TABS: 5.0000 mg | ORAL_TABLET | Freq: Two times a day (BID) | ORAL | 1 refills | Status: DC

## 2019-07-08 DIAGNOSIS — Z23 Encounter for immunization: Secondary | ICD-10-CM | POA: Diagnosis not present

## 2019-07-29 ENCOUNTER — Telehealth: Payer: Self-pay | Admitting: *Deleted

## 2019-07-29 NOTE — Telephone Encounter (Signed)
She will need an evaluation in office before a referral or other recommendations can be made. Offer in office appt so we can evaluate her.

## 2019-07-29 NOTE — Telephone Encounter (Signed)
Bernadette with [facility] called and stated that patient's left hand bleeding is still the same since Monday. Bleeding has not slowed down. Nurse is wondering if patient should be referred to Vascular and Vein.   Stated that on Monday patient was scratched by a cat on the back of her Left hand and ever since she has been bleeding. Had dressing changed today and bandage was soaked and its still bleeding. Nurse is wondering if patient's vein in hand could have been punctured or scratched. Wants to know if this needs to be referred out. Please Advise. (sent to [facility] due to Dr. Mariea Clonts out of office)

## 2019-07-29 NOTE — Telephone Encounter (Signed)
Appointment offered for today. Cannot bring till tomorrow. Scheduled with Dinah for tomorrow.

## 2019-07-30 ENCOUNTER — Other Ambulatory Visit: Payer: Self-pay

## 2019-07-30 ENCOUNTER — Encounter: Payer: Self-pay | Admitting: Family

## 2019-07-30 ENCOUNTER — Ambulatory Visit (INDEPENDENT_AMBULATORY_CARE_PROVIDER_SITE_OTHER): Payer: Medicare Other | Admitting: Family

## 2019-07-30 VITALS — BP 142/78 | HR 62 | Temp 98.2°F | Ht 64.0 in | Wt 198.8 lb

## 2019-07-30 DIAGNOSIS — S61452A Open bite of left hand, initial encounter: Secondary | ICD-10-CM | POA: Diagnosis not present

## 2019-07-30 DIAGNOSIS — W5501XA Bitten by cat, initial encounter: Secondary | ICD-10-CM

## 2019-07-30 LAB — CBC WITH DIFFERENTIAL/PLATELET
Absolute Monocytes: 532 cells/uL (ref 200–950)
Basophils Absolute: 9 cells/uL (ref 0–200)
Basophils Relative: 0.2 %
Eosinophils Absolute: 31 cells/uL (ref 15–500)
Eosinophils Relative: 0.7 %
HCT: 28 % — ABNORMAL LOW (ref 35.0–45.0)
Hemoglobin: 8.8 g/dL — ABNORMAL LOW (ref 11.7–15.5)
Lymphs Abs: 1382 cells/uL (ref 850–3900)
MCH: 30.1 pg (ref 27.0–33.0)
MCHC: 31.4 g/dL — ABNORMAL LOW (ref 32.0–36.0)
MCV: 95.9 fL (ref 80.0–100.0)
MPV: 9.2 fL (ref 7.5–12.5)
Monocytes Relative: 12.1 %
Neutro Abs: 2446 cells/uL (ref 1500–7800)
Neutrophils Relative %: 55.6 %
Platelets: 198 10*3/uL (ref 140–400)
RBC: 2.92 10*6/uL — ABNORMAL LOW (ref 3.80–5.10)
RDW: 13.9 % (ref 11.0–15.0)
Total Lymphocyte: 31.4 %
WBC: 4.4 10*3/uL (ref 3.8–10.8)

## 2019-07-30 MED ORDER — DOXYCYCLINE HYCLATE 100 MG PO TABS: 100.0000 mg | ORAL_TABLET | Freq: Two times a day (BID) | ORAL | 0 refills | Status: AC

## 2019-07-30 MED ORDER — SACCHAROMYCES BOULARDII 250 MG PO CAPS: 250.0000 mg | ORAL_CAPSULE | Freq: Two times a day (BID) | ORAL | 0 refills | Status: AC

## 2019-07-30 NOTE — Patient Instructions (Addendum)
Change left hand dressing every day and as needed if soiled.    Animal Bite, Adult Animal bite wounds can be mild or serious. It is important to get medical treatment to prevent infection. Ask your doctor if you need treatment to prevent an infection that can spread from animals to humans (rabies). Follow these instructions at home: Wound care   Follow instructions from your doctor about how to take care of your wound. Make sure you: ? Wash your hands with soap and water before you change your bandage (dressing). If you cannot use soap and water, use hand sanitizer. ? Change your bandage as told by your doctor. ? Leave stitches (sutures), skin glue, or skin tape (adhesive) strips in place. They may need to stay in place for 2 weeks or longer. If tape strips get loose and curl up, you may trim the loose edges. Do not remove tape strips completely unless your doctor says it is okay.  Check your wound every day for signs of infection. Check for: ? More redness, swelling, or pain. ? More fluid or blood. ? Warmth. ? Pus or a bad smell. Medicines  Take or apply over-the-counter and prescription medicines only as told by your doctor.  If you were prescribed an antibiotic, take or apply it as told by your doctor. Do not stop using the antibiotic even if your wound gets better. General instructions   Keep the injured area raised (elevated) above the level of your heart while you are sitting or lying down.  If directed, put ice on the injured area. ? Put ice in a plastic bag. ? Place a towel between your skin and the bag. ? Leave the ice on for 20 minutes, 2-3 times per day.  Keep all follow-up visits as told by your doctor. This is important. Contact a doctor if:  You have more redness, swelling, or pain around your wound.  Your wound feels warm to the touch.  You have a fever or chills.  You have a general feeling of sickness (malaise).  You feel sick to your stomach (nauseous).   You throw up (vomit).  You have pain that does not get better. Get help right away if:  You have a red streak going away from your wound.  You have any of these coming from your wound: ? Non-clear fluid. ? More blood. ? Pus or a bad smell.  You have trouble moving your injured area.  You lose feeling (have numbness) or feel tingling anywhere on your body. Summary  It is important to get the right medical treatment for animal bites. Treatment can help you to not get an infection. Ask your doctor if you need treatment to prevent an infection that can spread from animals to humans (rabies).  Check your wound every day for signs of infection, such as more redness or swelling instead of less.  If you have a red streak going away from your wound, get medical help right away. This information is not intended to replace advice given to you by your health care provider. Make sure you discuss any questions you have with your health care provider. Document Released: 09/09/2005 Document Revised: 09/04/2017 Document Reviewed: 03/20/2017 Elsevier Patient Education  2020 [employer].

## 2019-07-30 NOTE — Progress Notes (Signed)
Provider: Sacred Roa FNP-C  Gayland Curry, DO  Patient Care Team: Gayland Curry, DO as PCP - General (Geriatric Medicine) [place], Peter M, MD as Attending Physician (Cardiology) [facility] [place], MD as Consulting Physician (Neurology)  Extended Emergency Contact Information Primary Emergency Contact: Darden Palmer [place] of [place] Phone: [phone] Mobile Phone: [phone] Relation: Relative Secondary Emergency Contact: [place] of [place] Phone: [phone] Mobile Phone: [phone] Relation: None  Code Status:  Full Code  Goals of care: Advanced Directive information Advanced Directives 08/08/2018  Does Patient Have a Medical Advance Directive? No  Type of Advance Directive -  Does patient want to make changes to medical advance directive? -  Copy of [place] in Chart? -  Would patient like information on creating a medical advance directive? No - Patient declined  Pre-existing out of facility DNR order (yellow form or pink MOST form) -     Chief Complaint  Patient presents with  . Acute Visit    Cat bite on Left Hand, patient states happened monday morning.  Patient states still bleeding and changes frequently   . Medication Management    nurse at [facility] advised to start vitamin d which she started.     HPI:  Pt is a 85 y.o. [name] seen today  for an acute visit for evaluation of left hand cat bite.she states sleeps in the bed with her cat but she startle on Monday morning and the cat bite her on left hand.Hand bled on Monday until Thursday requiring dressing change twice a day.she reside in [facility] and dressing was done by facility Nurse.she denies any fever or chills.though state felt cold yesterday.  She states no bleeding noted today.       Past Medical History:  Diagnosis Date  . A-fib ([place])   . Abdominal pain, unspecified site   . Abnormality of  gait 06/02/2013  . Arthritis   . Cataracts, bilateral   . Depression   . Diverticulosis of colon (without mention of hemorrhage)   . Diverticulosis of colon (without mention of hemorrhage) 10/27/2012  . Edema 10/27/2012  . GI bleed 06/2002  . Hemorrhage of gastrointestinal tract, unspecified   . History of stomach ulcers   . Hypertension   . Insomnia, unspecified 10/27/2012  . Localized osteoarthrosis not specified whether primary or secondary, lower leg 05/31/2011  . Migraines   . Nausea alone 10/28/2012  . Obesity, unspecified 10/27/2012  . Osteoarthrosis, unspecified whether generalized or localized, lower leg   . Other acariasis    peripherial neuropathy  . Other and unspecified alcohol dependence, continuous drinking behavior 10/28/2012  . Palpitations   . PVC (premature ventricular contraction)   . Unspecified hereditary and idiopathic peripheral neuropathy   . UTI (lower urinary tract infection)    pt state uti w/ no pain   Past Surgical History:  Procedure Laterality Date  . ARTHROSCOPIC REPAIR ACL    . BREAST BIOPSY  1973   left  . BREAST REDUCTION SURGERY  12/23/2003  . cataracts    . CHOLECYSTECTOMY  2005  . TONSILLECTOMY AND ADENOIDECTOMY  1941    Allergies  Allergen Reactions  . Mirtazapine     tremor  . Nickel Other (See Comments)    inflammation  . Other Itching    Acrylic nails, and trace metals  . Prednisone     Causes sleep disturbances     Outpatient Encounter Medications as of 07/30/2019  Medication Sig  . acetaminophen (TYLENOL) 325 MG tablet Take 750-1,000 mg by mouth See admin instructions. Take 1000 mg in the morning and take 750 mg at night  . allopurinol (ZYLOPRIM) 100 MG tablet TAKE 1 TABLET BY MOUTH DAILY  . apixaban (ELIQUIS) 5 MG TABS tablet Take 1 tablet (5 mg total) by mouth 2 (two) times daily.  . flecainide (TAMBOCOR) 50 MG tablet TAKE 1 TABLET BY MOUTH TWICE DAILY FOR ATRIAL FIBRILLATION  . HYDROcodone-acetaminophen (NORCO) 5-325 MG tablet Take  1 tablet by mouth every 6 (six) hours as needed for moderate pain. One to two tabs every 4-6 hours for pain  . irbesartan (AVAPRO) 75 MG tablet TAKE 1 TABLET BY MOUTH DAILY  . Melatonin 5 MG TABS Take 5 mg by mouth at bedtime.  . nebivolol (BYSTOLIC) 10 MG tablet Take 1 tablet (10 mg total) by mouth daily.  . ondansetron (ZOFRAN) 4 MG tablet Take 1 tablet (4 mg total) by mouth 2 (two) times daily as needed for nausea.  Marland Kitchen Phenylephrine HCl (NEO-SYNEPHRINE NA) Place 1 spray into the nose 2 (two) times daily as needed (congestion).  . sodium chloride (OCEAN) 0.65 % SOLN nasal spray Place 2 sprays into both nostrils 2 (two) times daily.   Marland Kitchen zolpidem (AMBIEN) 5 MG tablet TAKE 1 TABLET BY MOUTH EVERY NIGHT AT BEDTIME FOR SLEEP  . furosemide (LASIX) 20 MG tablet Take 1 tablet (20 mg total) by mouth as needed.  . [DISCONTINUED] chlorthalidone (HYGROTON) 25 MG tablet Take 25 mg by mouth daily as needed. (SBP >180 mmHg)    No facility-administered encounter medications on file as of 07/30/2019.     Review of Systems  Constitutional: Negative for chills, fatigue and fever.  Respiratory: Negative for cough, chest tightness, shortness of breath and wheezing.   Cardiovascular: Negative for chest pain, palpitations and leg swelling.  Gastrointestinal: Negative for abdominal distention, abdominal pain, constipation, diarrhea, nausea and vomiting.  Genitourinary:       Urine incontinent   Musculoskeletal: Positive for arthralgias and gait problem. Negative for myalgias.  Skin: Positive for wound. Negative for color change, pallor and rash.       Left hand cat bite  Neurological: Negative for dizziness, weakness, light-headedness and headaches.  Psychiatric/Behavioral: Negative for agitation and sleep disturbance. The patient is not nervous/anxious.     Immunization History  Administered Date(s) Administered  . Influenza Split 07/23/2013  . Influenza Whole 06/23/2012  . Influenza, High Dose Seasonal PF  07/19/2016, 05/23/2017, 07/02/2019  . Influenza,inj,Quad PF,6+ Mos 06/23/2015, 07/17/2018  . Influenza-Unspecified 10/Martha/2015  . Pneumococcal Conjugate-13 10/04/2015  . Pneumococcal Polysaccharide-23 09/23/2010  . Td 09/23/2010  . Zoster 09/23/1998   Pertinent  Health Maintenance Due  Topic Date Due  . INFLUENZA VACCINE  Completed  . DEXA SCAN  Completed  . PNA vac Low Risk Adult  Completed   Fall Risk  07/30/2019 05/26/2019 04/14/2019 12/09/2018 08/26/2018  Falls in the past year? 0 0 0 0 1  Comment - - - - -  Number falls in past yr: 0 0 0 0 0  Injury with Fall? 0 0 0 0 0  Comment - - - - -  Risk for fall due to : - - - - -  Follow up - - - - -    Vitals:   07/30/19 1109  BP: (!) 142/78  Pulse: 62  Temp: 98.2 F (36.8 C)  TempSrc: Temporal  SpO2: 95%  Weight: 198 lb 12.8 oz (90.2 kg)  Height: 5\' 4"  (1.626 [name])   Body mass index is 34.12 kg/[name]. Physical Exam Vitals signs reviewed.  Constitutional:      General: She is not in acute distress.    Appearance: She is not ill-appearing.  HENT:     Head: Normocephalic.  Eyes:     General: No scleral icterus.       Right eye: No discharge.        Left eye: No discharge.     Conjunctiva/sclera: Conjunctivae normal.     Pupils: Pupils are equal, round, and reactive to light.  Neck:     Musculoskeletal: Normal range of motion. No neck rigidity or muscular tenderness.  Cardiovascular:     Rate and Rhythm: Normal rate. Rhythm irregular.     Pulses: Normal pulses.     Heart sounds: Normal heart sounds. No murmur. No friction rub. No gallop.   Pulmonary:     Effort: Pulmonary effort is normal. No respiratory distress.     Breath sounds: Normal breath sounds. No wheezing, rhonchi or rales.  Chest:     Chest wall: No tenderness.  Abdominal:     General: Bowel sounds are normal. There is no distension.     Palpations: Abdomen is soft. There is no mass.     Tenderness: There is no abdominal tenderness. There is no right CVA  tenderness, left CVA tenderness, guarding or rebound.  Lymphadenopathy:     Cervical: No cervical adenopathy.  Skin:    General: Skin is warm and dry.     Coloration: Skin is not pale.     Findings: No bruising, erythema or rash.     Comments: Left dorsal hand laceration with steri-strips intact.surrounding skin tissue without any swelling,tenderness or redness.moderate amounts of bright red blood noted on foam dressing but not kerlix.Old dressing removed,steri-strips intact.covered with 4 X 4 absorbant gauze and secure with Kerlix and paper tape.Patient tolerated procedure well.   Neurological:     Mental Status: She is alert and oriented to person, place, and time.     Cranial Nerves: No cranial nerve deficit.     Sensory: No sensory deficit.     Motor: No weakness.     Gait: Gait abnormal.  Psychiatric:        Mood and Affect: Mood normal.        Behavior: Behavior normal.        Thought Content: Thought content normal.        Judgment: Judgment normal.    Labs reviewed: Recent Labs    11/16/Martha 1158 11/17/Martha 0413  NA 132* 134*  K 3.7 4.3  CL 95* 96*  CO2 26 28  GLUCOSE 130* 110*  BUN 14 16  CREATININE 0.91 0.83  CALCIUM 8.9 8.7*  MG 1.7  --   PHOS 3.2  --    Recent Labs    11/16/Martha 1158 11/17/Martha 0413  AST 24 Martha  ALT 15 13  ALKPHOS 73 65  BILITOT 1.2 0.9  PROT 6.8 5.9*  ALBUMIN 3.8 3.3*   Recent Labs    11/16/Martha 1158 11/17/Martha 0413 11/18/Martha 0415  WBC 7.9 6.2 5.1  NEUTROABS 5.6 4.6 3.2  HGB 13.6 12.3 11.6*  HCT 41.4 38.1 36.7  MCV 97.2 99.7 101.1*  PLT 154 179 173   Lab Results  Component Value Date   TSH 1.215 06/05/2013   Lab Results  Component Value Date   HGBA1C 5.3 10/03/2015   Lab Results  Component Value Date  CHOL 198 04/08/2018   HDL 74 04/08/2018   LDLCALC 106 (H) 04/08/2018   TRIG 87 04/08/2018   CHOLHDL 2.7 04/08/2018    Significant Diagnostic Results in last 30 days:  No results found.  Assessment/Plan 1. Cat bite of left  hand, initial encounter Afebrile.Left dorsal hand laceration with steri-strips intact.surrounding skin tissue without any swelling,tenderness or redness.moderate amounts of bright red blood noted on foam dressing but not kerlix.Old dressing removed,steri-strips intact.covered with 4 X 4 absorbant gauze and secure with Kerlix and paper tape.Patient tolerated procedure well.  - CBC with Differential/Platelet - doxycycline (VIBRA-TABS) 100 MG tablet; Take 1 tablet (100 mg total) by mouth 2 (two) times daily for 7 days.  Dispense: 14 tablet; Refill: 0 - saccharomyces boulardii (FLORASTOR) 250 MG capsule; Take 1 capsule (250 mg total) by mouth 2 (two) times daily for 10 days.  Dispense: 20 capsule; Refill: 0 - encouraged to notify provider if symptoms worsen or not resolved or any redness,swelling or drainage from laceration site.  Family/ staff Communication: Reviewed plan of care with patient.  Labs/tests ordered:  - CBC with Differential/Platelet  Sandrea Hughs, NP

## 2019-08-05 ENCOUNTER — Ambulatory Visit (INDEPENDENT_AMBULATORY_CARE_PROVIDER_SITE_OTHER): Payer: Medicare Other | Admitting: Nurse Practitioner

## 2019-08-05 ENCOUNTER — Encounter: Payer: Self-pay | Admitting: Nurse Practitioner

## 2019-08-05 ENCOUNTER — Other Ambulatory Visit: Payer: Self-pay

## 2019-08-05 DIAGNOSIS — Z0000 Encounter for general adult medical examination without abnormal findings: Secondary | ICD-10-CM | POA: Diagnosis not present

## 2019-08-05 NOTE — Patient Instructions (Signed)
[name] , Thank you for taking time to come for your Medicare Wellness Visit. I appreciate your ongoing commitment to your health goals. Please review the following plan we discussed and let me know if I can assist you in the future.   Screening recommendations/referrals: Colonoscopy aged out Mammogram aged out Bone Density up to date Recommended yearly ophthalmology/optometry visit for glaucoma screening and checkup Recommended yearly dental visit for hygiene and checkup  Vaccinations: Influenza vaccine up to date Pneumococcal vaccine up to date Tdap vaccineup to date Shingles vaccine- recommended to get at your local pharmacy     Advanced directives: on file   Conditions/risks identified: fall risk due to decrease mobility.   Next appointment: 1 year   Preventive Care 16 Years and Older, Female Preventive care refers to lifestyle choices and visits with your health care provider that can promote health and wellness. What does preventive care include?  A yearly physical exam. This is also called an annual well check.  Dental exams once or twice a year.  Routine eye exams. Ask your health care provider how often you should have your eyes checked.  Personal lifestyle choices, including:  Daily care of your teeth and gums.  Regular physical activity.  Eating a healthy diet.  Avoiding tobacco and drug use.  Limiting alcohol use.  Practicing safe sex.  Taking low-dose aspirin every day.  Taking vitamin and mineral supplements as recommended by your health care provider. What happens during an annual well check? The services and screenings done by your health care provider during your annual well check will depend on your age, overall health, lifestyle risk factors, and family history of disease. Counseling  Your health care provider may ask you questions about your:  Alcohol use.  Tobacco use.  Drug use.  Emotional well-being.  Home and relationship  well-being.  Sexual activity.  Eating habits.  History of falls.  Memory and ability to understand (cognition).  Work and work Statistician.  Reproductive health. Screening  You may have the following tests or measurements:  Height, weight, and BMI.  Blood pressure.  Lipid and cholesterol levels. These may be checked every 5 years, or more frequently if you are over 40 years old.  Skin check.  Lung cancer screening. You may have this screening every year starting at age 15 if you have a 30-pack-year history of smoking and currently smoke or have quit within the past 15 years.  Fecal occult blood test (FOBT) of the stool. You may have this test every year starting at age 71.  Flexible sigmoidoscopy or colonoscopy. You may have a sigmoidoscopy every 5 years or a colonoscopy every 10 years starting at age 36.  Hepatitis C blood test.  Hepatitis B blood test.  Sexually transmitted disease (STD) testing.  Diabetes screening. This is done by checking your blood sugar (glucose) after you have not eaten for a while (fasting). You may have this done every 1-3 years.  Bone density scan. This is done to screen for osteoporosis. You may have this done starting at age 46.  Mammogram. This may be done every 1-2 years. Talk to your health care provider about how often you should have regular mammograms. Talk with your health care provider about your test results, treatment options, and if necessary, the need for more tests. Vaccines  Your health care provider may recommend certain vaccines, such as:  Influenza vaccine. This is recommended every year.  Tetanus, diphtheria, and acellular pertussis (Tdap, Td) vaccine. You  may need a Td booster every 10 years.  Zoster vaccine. You may need this after age 75.  Pneumococcal 13-valent conjugate (PCV13) vaccine. One dose is recommended after age 58.  Pneumococcal polysaccharide (PPSV23) vaccine. One dose is recommended after age 20.  Talk to your health care provider about which screenings and vaccines you need and how often you need them. This information is not intended to replace advice given to you by your health care provider. Make sure you discuss any questions you have with your health care provider. Document Released: 10/06/2015 Document Revised: 05/29/2016 Document Reviewed: 07/11/2015 Elsevier Interactive Patient Education  2017 [employer] Prevention in the Home Falls can cause injuries. They can happen to people of all ages. There are many things you can do to make your home safe and to help prevent falls. What can I do on the outside of my home?  Regularly fix the edges of walkways and driveways and fix any cracks.  Remove anything that might make you trip as you walk through a door, such as a raised step or threshold.  Trim any bushes or trees on the path to your home.  Use bright outdoor lighting.  Clear any walking paths of anything that might make someone trip, such as rocks or tools.  Regularly check to see if handrails are loose or broken. Make sure that both sides of any steps have handrails.  Any raised decks and porches should have guardrails on the edges.  Have any leaves, snow, or ice cleared regularly.  Use sand or salt on walking paths during winter.  Clean up any spills in your garage right away. This includes oil or grease spills. What can I do in the bathroom?  Use night lights.  Install grab bars by the toilet and in the tub and shower. Do not use towel bars as grab bars.  Use non-skid mats or decals in the tub or shower.  If you need to sit down in the shower, use a plastic, non-slip stool.  Keep the floor dry. Clean up any water that spills on the floor as soon as it happens.  Remove soap buildup in the tub or shower regularly.  Attach bath mats securely with double-sided non-slip rug tape.  Do not have throw rugs and other things on the floor that can make you  trip. What can I do in the bedroom?  Use night lights.  Make sure that you have a light by your bed that is easy to reach.  Do not use any sheets or blankets that are too big for your bed. They should not hang down onto the floor.  Have a firm chair that has side arms. You can use this for support while you get dressed.  Do not have throw rugs and other things on the floor that can make you trip. What can I do in the kitchen?  Clean up any spills right away.  Avoid walking on wet floors.  Keep items that you use a lot in easy-to-reach places.  If you need to reach something above you, use a strong step stool that has a grab bar.  Keep electrical cords out of the way.  Do not use floor polish or wax that makes floors slippery. If you must use wax, use non-skid floor wax.  Do not have throw rugs and other things on the floor that can make you trip. What can I do with my stairs?  Do not leave any items  on the stairs.  Make sure that there are handrails on both sides of the stairs and use them. Fix handrails that are broken or loose. Make sure that handrails are as long as the stairways.  Check any carpeting to make sure that it is firmly attached to the stairs. Fix any carpet that is loose or worn.  Avoid having throw rugs at the top or bottom of the stairs. If you do have throw rugs, attach them to the floor with carpet tape.  Make sure that you have a light switch at the top of the stairs and the bottom of the stairs. If you do not have them, ask someone to add them for you. What else can I do to help prevent falls?  Wear shoes that:  Do not have high heels.  Have rubber bottoms.  Are comfortable and fit you well.  Are closed at the toe. Do not wear sandals.  If you use a stepladder:  Make sure that it is fully opened. Do not climb a closed stepladder.  Make sure that both sides of the stepladder are locked into place.  Ask someone to hold it for you, if  possible.  Clearly mark and make sure that you can see:  Any grab bars or handrails.  First and last steps.  Where the edge of each step is.  Use tools that help you move around (mobility aids) if they are needed. These include:  Canes.  Walkers.  Scooters.  Crutches.  Turn on the lights when you go into a dark area. Replace any light bulbs as soon as they burn out.  Set up your furniture so you have a clear path. Avoid moving your furniture around.  If any of your floors are uneven, fix them.  If there are any pets around you, be aware of where they are.  Review your medicines with your doctor. Some medicines can make you feel dizzy. This can increase your chance of falling. Ask your doctor what other things that you can do to help prevent falls. This information is not intended to replace advice given to you by your health care provider. Make sure you discuss any questions you have with your health care provider. Document Released: 07/06/2009 Document Revised: 02/15/2016 Document Reviewed: 10/14/2014 Elsevier Interactive Patient Education  2017 [employer].

## 2019-08-05 NOTE — Progress Notes (Signed)
Subjective:   [name] is a 85 y.o. female who presents for Medicare Annual (Subsequent) preventive examination.  Review of Systems:   Cardiac Risk Factors include: advanced age (>73men, >52 women);dyslipidemia;obesity (BMI >30kg/m2);sedentary lifestyle     Objective:     Vitals: LMP  (LMP Unknown)   There is no height or weight on file to calculate BMI.  Advanced Directives 08/05/2019 08/08/2018 08/08/2018 03/18/2018 03/14/2018 03/10/2018 12/10/2017  Does Patient Have a Medical Advance Directive? Yes No No Yes Yes Yes Yes  Type of Paramedic of [place];Living will;Out of facility DNR (pink MOST or yellow form) - - Press photographer;Living will;Out of facility DNR (pink MOST or yellow form) [place];Out of facility DNR (pink MOST or yellow form) [place];Out of facility DNR (pink MOST or yellow form) [place];Living will;Out of facility DNR (pink MOST or yellow form)  Does patient want to make changes to medical advance directive? No - Patient declined - - No - Patient declined - No - Patient declined No - Patient declined  Copy of [place] in Chart? Yes - validated most recent copy scanned in chart (See row information) - - Yes Yes Yes Yes  Would patient like information on creating a medical advance directive? Yes (Inpatient - patient defers creating a medical advance directive at this time - Information given) No - Patient declined No - Patient declined - - - -  Pre-existing out of facility DNR order (yellow form or pink MOST form) - - - Yellow form placed in chart (order not valid for inpatient use) Yellow form placed in chart (order not valid for inpatient use) Yellow form placed in chart (order not valid for inpatient use) Yellow form placed in chart (order not valid for inpatient use)    Tobacco Social History   Tobacco Use  Smoking Status Former Smoker  .  Packs/day: 1.00  . Years: 30.00  . Pack years: 30.00  . Types: Cigarettes  . Quit date: 10/09/1983  . Years since quitting: 35.8  Smokeless Tobacco Never Used     Counseling given: Not Answered   Clinical Intake:  Pre-visit preparation completed: Yes  Pain : 0-10 Pain Score: 3  Pain Type: Chronic pain Pain Location: Knee Pain Orientation: Right, Left Pain Radiating Towards: knees Pain Descriptors / Indicators: Aching, Sore, Tender, Sharp Pain Onset: More than a month ago Pain Frequency: Intermittent Pain Relieving Factors: pain medication Effect of Pain on Daily Activities: uses a walker, uses walker  Pain Relieving Factors: pain medication  BMI - recorded: 34.12 Nutritional Status: BMI > 30  Obese Nutritional Risks: Unintentional weight gain Diabetes: No  What is the 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 A-fib ([place])   . Abdominal pain, unspecified site   . Abnormality of gait 06/02/2013  . Arthritis   . Cataracts, bilateral   . Depression   . Diverticulosis of colon (without mention of hemorrhage)   . Diverticulosis of colon (without mention of hemorrhage) 10/27/2012  . Edema 10/27/2012  . GI bleed 06/2002  . Hemorrhage of gastrointestinal tract, unspecified   . History of stomach ulcers   . Hypertension   . Insomnia, unspecified 10/27/2012  . Localized osteoarthrosis not specified whether primary or secondary, lower leg 05/31/2011  . Migraines   . Nausea alone 10/28/2012  . Obesity,  unspecified 10/27/2012  . Osteoarthrosis, unspecified whether generalized or localized, lower leg   . Other acariasis    peripherial neuropathy  . Other and unspecified alcohol dependence, continuous drinking behavior 10/28/2012  . Palpitations   . PVC (premature ventricular contraction)   . Unspecified hereditary and idiopathic peripheral neuropathy   . UTI (lower urinary tract infection)    pt  state uti w/ no pain   Past Surgical History:  Procedure Laterality Date  . ARTHROSCOPIC REPAIR ACL    . BREAST BIOPSY  1973   left  . BREAST REDUCTION SURGERY  12/23/2003  . cataracts    . CHOLECYSTECTOMY  2005  . TONSILLECTOMY AND ADENOIDECTOMY  1941   Family History  Problem Relation Age of Onset  . Breast cancer Mother   . Hypertension Mother   . Hypertension Father   . Allergic rhinitis Neg Hx   . Asthma Neg Hx    Social History   Socioeconomic History  . Marital status: Widowed    Spouse name: Not on file  . Number of children: Not on file  . Years of education: Not on file  . Highest education level: Not on file  Occupational History  . Occupation: retired Marine scientist  Social Needs  . Financial resource strain: Not hard at all  . Food insecurity    Worry: Never true    Inability: Never true  . Transportation needs    Medical: No    Non-medical: No  Tobacco Use  . Smoking status: Former Smoker    Packs/day: 1.00    Years: 30.00    Pack years: 30.00    Types: Cigarettes    Quit date: 10/09/1983    Years since quitting: 35.8  . Smokeless tobacco: Never Used  Substance and Sexual Activity  . Alcohol use: Yes    Alcohol/week: 1.0 - 2.0 standard drinks    Types: 1 - 2 Shots of liquor per week    Comment: 2 DRINKS A DAY - daily  scotch   . Drug use: No  . Sexual activity: Never  Lifestyle  . Physical activity    Days per week: 0 days    Minutes per session: 0 min  . Stress: Only a little  Relationships  . Social Herbalist on phone: Three times a week    Gets together: Three times a week    Attends religious service: Never    Active member of club or organization: No    Attends meetings of clubs or organizations: Never    Relationship status: Widowed  Other Topics Concern  . Not on file  Social History Narrative   Lives at [facility], in [place]   Widow -2004   Stopped smoking 1985   POA, DNR, Living Will   [place] with walker   Alcohol scotch  daily    Exercise none    Outpatient Encounter Medications as of 08/05/2019  Medication Sig  . acetaminophen (TYLENOL) 325 MG tablet Take 750-1,000 mg by mouth See admin instructions. Take 1000 mg in the morning and take 750 mg at night  . allopurinol (ZYLOPRIM) 100 MG tablet TAKE 1 TABLET BY MOUTH DAILY  . apixaban (ELIQUIS) 5 MG TABS tablet Take 1 tablet (5 mg total) by mouth 2 (two) times daily.  . Ascorbic Acid (VITAMIN C) 100 MG tablet Take 100 mg by mouth daily.  . Cholecalciferol (VITAMIN D3) 250 MCG (10000 UT) capsule Take 10,000 Units by mouth daily.  Marland Kitchen doxycycline (VIBRA-TABS)  100 MG tablet Take 1 tablet (100 mg total) by mouth 2 (two) times daily for 7 days.  . flecainide (TAMBOCOR) 50 MG tablet TAKE 1 TABLET BY MOUTH TWICE DAILY FOR ATRIAL FIBRILLATION  . furosemide (LASIX) 20 MG tablet Take 1 tablet (20 mg total) by mouth as needed.  . irbesartan (AVAPRO) 75 MG tablet TAKE 1 TABLET BY MOUTH DAILY  . Melatonin 5 MG TABS Take 5 mg by mouth at bedtime.  . nebivolol (BYSTOLIC) 10 MG tablet Take 1 tablet (10 mg total) by mouth daily.  . ondansetron (ZOFRAN) 4 MG tablet Take 1 tablet (4 mg total) by mouth 2 (two) times daily as needed for nausea.  Marland Kitchen Phenylephrine HCl (NEO-SYNEPHRINE NA) Place 1 spray into the nose 2 (two) times daily as needed (congestion).  . saccharomyces boulardii (FLORASTOR) 250 MG capsule Take 1 capsule (250 mg total) by mouth 2 (two) times daily for 10 days.  Marland Kitchen zolpidem (AMBIEN) 5 MG tablet TAKE 1 TABLET BY MOUTH EVERY NIGHT AT BEDTIME FOR SLEEP  . [DISCONTINUED] HYDROcodone-acetaminophen (NORCO) 5-325 MG tablet Take 1 tablet by mouth every 6 (six) hours as needed for moderate pain. One to two tabs every 4-6 hours for pain  . [DISCONTINUED] sodium chloride (OCEAN) 0.65 % SOLN nasal spray Place 2 sprays into both nostrils 2 (two) times daily.    No facility-administered encounter medications on file as of 08/05/2019.     Activities of Daily Living In your  present state of health, do you have any difficulty performing the following activities: 08/05/2019 08/08/2018  Hearing? Y N  Vision? N N  Difficulty concentrating or making decisions? N N  Walking or climbing stairs? Y Y  Dressing or bathing? Y N  Comment has an Engineer, production -  Doing errands, shopping? Tempie Donning  Comment has an Engineer, production, uses wheelchair -  Conservation officer, nature and eating ? N -  Using the Toilet? N -  In the past six months, have you accidently leaked urine? Y -  Do you have problems with loss of bowel control? N -  Managing your Medications? N -  Managing your Finances? N -  Housekeeping or managing your Housekeeping? N -  Some recent data might be hidden    Patient Care Team: Gayland Curry, DO as PCP - General (Geriatric Medicine) [place], Peter M, MD as Attending Physician (Cardiology) [facility] Rosezella [place], MD as Consulting Physician (Neurology)    Assessment:   This is a routine wellness examination for Marine.  Exercise Activities and Dietary recommendations Current Exercise Habits: The patient does not participate in regular exercise at present, Exercise limited by: orthopedic condition(s)  Goals   None     Fall Risk Fall Risk  08/05/2019 07/30/2019 05/26/2019 04/14/2019 12/09/2018  Falls in the past year? 0 0 0 0 0  Comment - - - - -  Number falls in past yr: 0 0 0 0 0  Injury with Fall? 0 0 0 0 0  Comment - - - - -  Risk for fall due to : - - - - -  Follow up - - - - -   Is the patient's home free of loose throw rugs in walkways, pet beds, electrical cords, etc?   no      Grab bars in the bathroom? yes      Handrails on the stairs?   No stairs in home      Adequate lighting?   yes  Timed Get Up  and Go performed: na  Depression Screen PHQ 2/9 Scores 08/05/2019 05/26/2019 04/14/2019 12/09/2018  PHQ - 2 Score 0 0 0 0     Cognitive Function MMSE - Mini Mental State Exam 03/10/2018 01/29/2017 10/04/2015  Orientation to time 5 5 5   Orientation to  Place 5 5 5   Registration 3 3 3   Attention/ Calculation 5 5 5   Recall 3 3 3   Language- name 2 objects 2 2 2   Language- repeat 1 1 1   Language- follow 3 step command 3 3 3   Language- read & follow direction 1 1 1   Write a sentence 1 1 1   Copy design 1 1 1   Total score 30 30 30      6CIT Screen 08/05/2019  What Year? 0 points  What month? 0 points  What time? 0 points  Count back from 20 0 points  Months in reverse 2 points  Repeat phrase 0 points  Total Score 2    Immunization History  Administered Date(s) Administered  . Influenza Split 07/23/2013  . Influenza Whole 06/23/2012  . Influenza, High Dose Seasonal PF 07/19/2016, 05/23/2017, 07/02/2019  . Influenza,inj,Quad PF,6+ Mos 06/23/2015, 07/17/2018  . Influenza-Unspecified 07/11/2014  . Pneumococcal Conjugate-13 10/04/2015  . Pneumococcal Polysaccharide-23 09/23/2010  . Td 09/23/2010  . Zoster 09/23/1998    Qualifies for Shingles Vaccine?yes, recommended to get shingrix  Screening Tests Health Maintenance  Topic Date Due  . TETANUS/TDAP  09/23/2020  . INFLUENZA VACCINE  Completed  . DEXA SCAN  Completed  . PNA vac Low Risk Adult  Completed    Cancer Screenings: Lung: Low Dose CT Chest recommended if Age 40-80 years, 30 pack-year currently smoking OR have quit w/in 15years. Patient does not qualify. Breast:  Up to date on Mammogram? Yes   Up to date of Bone Density/Dexa? Yes Colorectal: aged out  Additional Screenings: : Hepatitis C Screening: na    Plan:      I have personally reviewed and noted the following in the patient's chart:   . Medical and social history . Use of alcohol, tobacco or illicit drugs  . Current medications and supplements . Functional ability and status . Nutritional status . Physical activity . Advanced directives . List of other physicians . Hospitalizations, surgeries, and ER visits in previous 12 months . Vitals . Screenings to include cognitive, depression, and falls  . Referrals and appointments  In addition, I have reviewed and discussed with patient certain preventive protocols, quality metrics, and best practice recommendations. A written personalized care plan for preventive services as well as general preventive health recommendations were provided to patient.     Lauree Chandler, NP  08/05/2019

## 2019-08-05 NOTE — Progress Notes (Signed)
    This service is provided via telemedicine  No vital signs collected/recorded due to the encounter was a telemedicine visit.   Location of patient (ex: home, work):  Home  Patient consents to a telephone visit:  Yes  Location of the provider (ex: office, home):  [facility]   Name of any referring provider: Gayland Curry, DO  Names of all persons participating in the telemedicine service and their role in the encounter:  S.Chrae B/CMA, Sherrie Mustache, NP, and Patient   Time spent on call:  9 min with medical assistant

## 2019-08-23 ENCOUNTER — Ambulatory Visit: Payer: Medicare Other | Admitting: Allergy and Immunology

## 2019-08-27 ENCOUNTER — Encounter: Payer: Self-pay | Admitting: Family

## 2019-08-27 ENCOUNTER — Ambulatory Visit (INDEPENDENT_AMBULATORY_CARE_PROVIDER_SITE_OTHER): Payer: Medicare Other | Admitting: Family

## 2019-08-27 ENCOUNTER — Other Ambulatory Visit: Payer: Self-pay

## 2019-08-27 ENCOUNTER — Telehealth: Payer: Self-pay | Admitting: *Deleted

## 2019-08-27 ENCOUNTER — Other Ambulatory Visit: Payer: Self-pay | Admitting: Family

## 2019-08-27 VITALS — BP 140/80 | HR 67 | Temp 97.5°F | Resp 20 | Ht 64.0 in | Wt 200.0 lb

## 2019-08-27 DIAGNOSIS — R600 Localized edema: Secondary | ICD-10-CM | POA: Diagnosis not present

## 2019-08-27 DIAGNOSIS — I5032 Chronic diastolic (congestive) heart failure: Secondary | ICD-10-CM

## 2019-08-27 DIAGNOSIS — K5901 Slow transit constipation: Secondary | ICD-10-CM | POA: Diagnosis not present

## 2019-08-27 DIAGNOSIS — G4700 Insomnia, unspecified: Secondary | ICD-10-CM | POA: Diagnosis not present

## 2019-08-27 DIAGNOSIS — L602 Onychogryphosis: Secondary | ICD-10-CM | POA: Diagnosis not present

## 2019-08-27 MED ORDER — DOCUSATE SODIUM 100 MG PO CAPS: 100.0000 mg | ORAL_CAPSULE | Freq: Every day | ORAL | 0 refills | Status: DC

## 2019-08-27 MED ORDER — FUROSEMIDE 20 MG PO TABS: ORAL_TABLET | ORAL | 0 refills | Status: DC

## 2019-08-27 MED ORDER — UNABLE TO FIND: 0 refills | Status: DC

## 2019-08-27 MED ORDER — TRAZODONE HCL 50 MG PO TABS: 25.0000 mg | ORAL_TABLET | Freq: Every evening | ORAL | 3 refills | Status: DC | PRN

## 2019-08-27 NOTE — Progress Notes (Signed)
Provider: Haileyann Staiger FNP-C  Gayland Curry, DO  Patient Care Team: Gayland Curry, DO as PCP - General (Geriatric Medicine) [place], Peter M, MD as Attending Physician (Cardiology) [facility] [place], MD as Consulting Physician (Neurology)  Extended Emergency Contact Information Primary Emergency Contact: Darden [place] of [place] Phone: [phone] Mobile Phone: [phone] Relation: Relative Secondary Emergency Contact: [place] of [place] Phone: [phone] Mobile Phone: [phone] Relation: None  Code Status:  Full Code  Goals of care: Advanced Directive information Advanced Directives 08/27/2019  Does Patient Have a Medical Advance Directive? Yes  Type of Paramedic of [place];Out of facility DNR (pink MOST or yellow form);Living will  Does patient want to make changes to medical advance directive? No - Patient declined  Copy of [place] in Chart? Yes - validated most recent copy scanned in chart (See row information)  Would patient like information on creating a medical advance directive? -  Pre-existing out of facility DNR order (yellow form or pink MOST form) -     Chief Complaint  Patient presents with  . Acute Visit    Patients c/o Shortness of breath, and leg swelling     HPI:  Pt is a 85 y.o. female seen today for an acute visit for evaluation of shortness of breath and leg swelling.she is here escorted by her care giver Joelene Millin.she resides at [facility].she states has had to take her furosemide 20 mg tablet for the past three days but does not seem to improve her symptoms.she state has gained 2 lbs weight since visit a month ago.she denies any fever,chills,sore throat,runny nose,or body aches.also denies any exposure to person with COIVD-19.  Constipation - states having hard bowel movement and strains.she  states her appetite is not great but tries to include vegetables in her diet.No rectal bleeding reported.she does not drink much water except a glass of water that she keeps at bedside at night.she drinks more tea during the day.   Insomnia - states having difficult sleeping at night.states used to take a higher dose of Ambien in the past but Dr.Reed cut her down to 5 mg tablet.she states this is not working for her.I've discussed with her high risk side effects with uses of Ambien with her advance age.she verbalized understanding.she would like to try trazodone then wean off Ambien.  She states previous left hand cat bite has healed well.    Past Medical History:  Diagnosis Date  . A-fib ([place])   . Abdominal pain, unspecified site   . Abnormality of gait 06/02/2013  . Arthritis   . Cataracts, bilateral   . Depression   . Diverticulosis of colon (without mention of hemorrhage)   . Diverticulosis of colon (without mention of hemorrhage) 10/27/2012  . Edema 10/27/2012  . GI bleed 06/2002  . Hemorrhage of gastrointestinal tract, unspecified   . History of stomach ulcers   . Hypertension   . Insomnia, unspecified 10/27/2012  . Localized osteoarthrosis not specified whether primary or secondary, lower leg 05/31/2011  . Migraines   . Nausea alone 10/28/2012  . Obesity, unspecified 10/27/2012  . Osteoarthrosis, unspecified whether generalized or localized, lower leg   . Other acariasis    peripherial neuropathy  . Other and unspecified alcohol dependence, continuous drinking behavior 10/28/2012  . Palpitations   . PVC (premature ventricular contraction)   . Unspecified hereditary and idiopathic peripheral neuropathy   . UTI (lower  urinary tract infection)    pt state uti w/ no pain   Past Surgical History:  Procedure Laterality Date  . ARTHROSCOPIC REPAIR ACL    . BREAST BIOPSY  1973   left  . BREAST REDUCTION SURGERY  12/23/2003  . cataracts    . CHOLECYSTECTOMY  2005  . TONSILLECTOMY AND  ADENOIDECTOMY  1941    Allergies  Allergen Reactions  . Mirtazapine     tremor  . Nickel Other (See Comments)    inflammation  . Other Itching    Acrylic nails, and trace metals  . Prednisone     Causes sleep disturbances     Outpatient Encounter Medications as of 08/27/2019  Medication Sig  . acetaminophen (TYLENOL) 325 MG tablet Take 750-1,000 mg by mouth See admin instructions. Take 1000 mg in the morning and take 750 mg at night  . allopurinol (ZYLOPRIM) 100 MG tablet TAKE 1 TABLET BY MOUTH DAILY  . apixaban (ELIQUIS) 5 MG TABS tablet Take 1 tablet (5 mg total) by mouth 2 (two) times daily.  . Ascorbic Acid (VITAMIN C) 100 MG tablet Take 100 mg by mouth daily.  . Cholecalciferol (VITAMIN D3) 250 MCG (10000 UT) capsule Take 10,000 Units by mouth daily.  . flecainide (TAMBOCOR) 50 MG tablet TAKE 1 TABLET BY MOUTH TWICE DAILY FOR ATRIAL FIBRILLATION  . furosemide (LASIX) 20 MG tablet Take 1 tablet (20 mg total) by mouth as needed.  . irbesartan (AVAPRO) 75 MG tablet TAKE 1 TABLET BY MOUTH DAILY  . Melatonin 5 MG TABS Take 5 mg by mouth at bedtime.  . nebivolol (BYSTOLIC) 10 MG tablet Take 1 tablet (10 mg total) by mouth daily.  . ondansetron (ZOFRAN) 4 MG tablet Take 1 tablet (4 mg total) by mouth 2 (two) times daily as needed for nausea.  Marland Kitchen Phenylephrine HCl (NEO-SYNEPHRINE NA) Place 1 spray into the nose 2 (two) times daily as needed (congestion).  Marland Kitchen zolpidem (AMBIEN) 5 MG tablet TAKE 1 TABLET BY MOUTH EVERY NIGHT AT BEDTIME FOR SLEEP   No facility-administered encounter medications on file as of 08/27/2019.     Review of Systems  Constitutional: Positive for appetite change. Negative for chills, fatigue and fever.  HENT: Negative for congestion, rhinorrhea, sinus pressure, sinus pain, sneezing and sore throat.   Eyes: Negative for discharge, redness, itching and visual disturbance.  Respiratory: Negative for chest tightness and wheezing.        Chronic cough and shortness of  breath with exertion.   Cardiovascular: Positive for leg swelling. Negative for chest pain and palpitations.  Gastrointestinal: Positive for constipation and nausea. Negative for abdominal distention, abdominal pain, diarrhea, rectal pain and vomiting.  Musculoskeletal: Positive for gait problem.    Immunization History  Administered Date(s) Administered  . Influenza Split 07/23/2013  . Influenza Whole 06/23/2012  . Influenza, High Dose Seasonal PF 07/19/2016, 05/23/2017, 07/02/2019  . Influenza,inj,Quad PF,6+ Mos 06/23/2015, 07/17/2018  . Influenza-Unspecified 07/11/2014  . Pneumococcal Conjugate-13 10/04/2015  . Pneumococcal Polysaccharide-23 09/23/2010  . Td 09/23/2010  . Zoster 09/23/1998   Pertinent  Health Maintenance Due  Topic Date Due  . INFLUENZA VACCINE  Completed  . DEXA SCAN  Completed  . PNA vac Low Risk Adult  Completed   Fall Risk  08/27/2019 08/05/2019 07/30/2019 05/26/2019 04/14/2019  Falls in the past year? 0 0 0 0 0  Comment - - - - -  Number falls in past yr: - 0 0 0 0  Injury with Fall? 0 0  0 0 0  Comment - - - - -  Risk for fall due to : - - - - -  Follow up - - - - -    Vitals:   08/27/19 1414  BP: 140/80  Pulse: 67  Resp: 20  Temp: (!) 97.5 F (36.4 C)  TempSrc: Oral  SpO2: (!) 87%  Weight: 200 lb (90.7 kg)  Height: 5\' 4"  (1.626 m)   Body mass index is 34.33 kg/m. Physical Exam Vitals signs reviewed.  Constitutional:      General: She is not in acute distress.    Appearance: She is obese. She is not ill-appearing.  HENT:     Head: Normocephalic.     Nose: Nose normal. No congestion or rhinorrhea.     Mouth/Throat:     Mouth: Mucous membranes are moist.     Pharynx: Oropharynx is clear. No oropharyngeal exudate or posterior oropharyngeal erythema.  Eyes:     General: No scleral icterus.       Right eye: No discharge.        Left eye: No discharge.     Extraocular Movements: Extraocular movements intact.     Conjunctiva/sclera:  Conjunctivae normal.     Pupils: Pupils are equal, round, and reactive to light.  Neck:     Musculoskeletal: Normal range of motion. No neck rigidity or muscular tenderness.     Vascular: No carotid bruit.  Cardiovascular:     Rate and Rhythm: Normal rate and regular rhythm.     Pulses: Normal pulses.     Heart sounds: Murmur present. No friction rub. No gallop.   Pulmonary:     Effort: Pulmonary effort is normal. No respiratory distress.     Breath sounds: Normal breath sounds. No wheezing, rhonchi or rales.  Chest:     Chest wall: No tenderness.  Abdominal:     General: Bowel sounds are normal. There is no distension.     Palpations: Abdomen is soft. There is no mass.     Tenderness: There is no abdominal tenderness. There is no right CVA tenderness, left CVA tenderness, guarding or rebound.  Musculoskeletal:        General: No swelling or tenderness.     Right lower leg: Edema present.     Left lower leg: Edema present.     Comments: On wheelchair during visit   Lymphadenopathy:     Cervical: No cervical adenopathy.  Skin:    General: Skin is warm and dry.     Coloration: Skin is not pale.     Findings: No bruising, erythema or rash.  Neurological:     Mental Status: She is alert and oriented to person, place, and time.     Cranial Nerves: No cranial nerve deficit.     Motor: No weakness.     Gait: Gait abnormal.  Psychiatric:        Mood and Affect: Mood normal.        Behavior: Behavior normal.        Thought Content: Thought content normal.        Judgment: Judgment normal.    Labs reviewed: No results for input(s): NA, K, CL, CO2, GLUCOSE, BUN, CREATININE, CALCIUM, MG, PHOS in the last 8760 hours. No results for input(s): AST, ALT, ALKPHOS, BILITOT, PROT, ALBUMIN in the last 8760 hours. Recent Labs    07/30/19 1150  WBC 4.4  NEUTROABS 2,446  HGB 8.8*  HCT 28.0*  MCV 95.9  PLT  198   Lab Results  Component Value Date   TSH 1.215 06/05/2013   Lab Results   Component Value Date   HGBA1C 5.3 10/03/2015   Lab Results  Component Value Date   CHOL 198 04/08/2018   HDL 74 04/08/2018   LDLCALC 106 (H) 04/08/2018   TRIG 87 04/08/2018   CHOLHDL 2.7 04/08/2018    Significant Diagnostic Results in last 30 days:  No results found.  Assessment/Plan 1. Chronic diastolic (congestive) heart failure (HCC) Has had 2 lbs weight gain with worsening leg edema.Lungs CTA bilaterally. - Encouraged to get weight check at the facility at  [facility] three times per week and notify provider if weight > 3 lbs from her baseline 198 lbs. - furosemide (LASIX) 20 MG tablet; Take 2 tablet (40 mg) daily  For 3 days then resume 20 mg tablet as needed.  Dispense: 30 tablet; Refill: 0 - UNABLE TO FIND; Med Name: check weight three times per week on Monday,Wednesday and Friday.Notify provider for abrupt weight gain > 3 lbs from her baseline 198 lbs.  Dispense: 1 Act; Refill: 0  2. Bilateral leg edema - Compression stockings script written to wear knee high hose on in the morning and off at bedtime.patient will purchase stocking at medical supply to be measured prior to purchase.script given to patient during visit. - encouraged to keep legs elevated. - furosemide (LASIX) 20 MG tablet; Take 2 tablet (40 mg) daily  For 3 days then resume 20 mg tablet as needed.  Dispense: 30 tablet; Refill: 0 - UNABLE TO FIND; Med Name: check weight three times per week on Monday,Wednesday and Friday.Notify provider for abrupt weight gain > 3 lbs from her baseline 198 lbs.  Dispense: 1 Act; Refill: 0  3. Slow transit constipation Describes stool as hard and straining.will add a stool softener.Encouraged to drink water. Continue with finder in diet.   - docusate sodium (COLACE) 100 MG capsule; Take 1 capsule (100 mg total) by mouth daily.  Dispense: 10 capsule; Refill: 0  4. Overgrown toenails Bilateral overgrown toenails noted. States podiatrist has not been allowed in the facility will refer to  out of facility podiatrist.  - Ambulatory referral to Podiatry  5. Insomnia, unspecified type Ambien 5 mg tablet ineffective.Discussed with her side effects of Ambien verbalized understanding.she will try taking Trazodone instead of Ambien then discontinue Ambien if trazodone works better for her.   - traZODone (DESYREL) 50 MG tablet; Take 0.5-1 tablets (25-50 mg total) by mouth at bedtime as needed for sleep.  Dispense: 30 tablet; Refill: 3  Family/ staff Communication: Reviewed plan of care with patient.  Labs/tests ordered: None   [name] C Rohen Kimes, NP

## 2019-08-27 NOTE — Telephone Encounter (Signed)
[name] rx'ed medication today for the dispense number of her choosing

## 2019-08-27 NOTE — Telephone Encounter (Signed)
[name] with [facility] called and stated that she was doing a In Home Visit with patient and patient was SOB after a short distance walk, SPO2 84%. Had her sit down and do deep breathing and it came up to 92%. Patient's legs and feet are bilaterally swollen with +1 edema. Complains of nausea and chest discomfort. Also states she is impacted but taking laxatives.  Vitals: T: 97.7, RR: 22, P: 62, BP 150/70 Resting SPO2 on RA is 87% Please Advise.

## 2019-08-27 NOTE — Telephone Encounter (Signed)
NP will be seeing this afternoon.  Sounds like CHF exacerbation vs progression of the suspected lung cancer that she's opted not to have investigated (saw Dr. Melvyn Novas about).  Also will need bowel regimen adjusted.

## 2019-09-10 ENCOUNTER — Ambulatory Visit (INDEPENDENT_AMBULATORY_CARE_PROVIDER_SITE_OTHER): Payer: Medicare Other | Admitting: Podiatry

## 2019-09-10 ENCOUNTER — Encounter: Payer: Self-pay | Admitting: Podiatry

## 2019-09-10 ENCOUNTER — Other Ambulatory Visit: Payer: Self-pay

## 2019-09-10 DIAGNOSIS — M79674 Pain in right toe(s): Secondary | ICD-10-CM | POA: Diagnosis not present

## 2019-09-10 DIAGNOSIS — I4891 Unspecified atrial fibrillation: Secondary | ICD-10-CM

## 2019-09-10 DIAGNOSIS — B351 Tinea unguium: Secondary | ICD-10-CM | POA: Diagnosis not present

## 2019-09-10 DIAGNOSIS — M79675 Pain in left toe(s): Secondary | ICD-10-CM | POA: Diagnosis not present

## 2019-09-10 DIAGNOSIS — D6869 Other thrombophilia: Secondary | ICD-10-CM | POA: Diagnosis not present

## 2019-09-10 NOTE — Progress Notes (Signed)
  Subjective:  Patient ID: [name], female    DOB: 12-02-1932,  MRN: [id number]  Chief Complaint  Patient presents with  . Nail Problem    pt is here for an ingrown toenail of the right second toenail, either the second or third toenail, pt also states that she has neuropathy when applying pressure to the area   85 y.o. female returns for the above complaint.  Patient presents with thickened elongated dystrophic toenails x10.  Patient states she has neuropathy from unknown origin.  She states that started to cause some pressure to the area.  She also has hammertoe contracture with severely in nature thus causing a lot of pain.  She states that she is not diabetic.  They have not been able to figure out the source of her neuropathy.  She denies any other acute complaints.  She lives in an assisted living facility.  She ambulates with regular sneakers.  Objective:  There were no vitals filed for this visit. Podiatric Exam: Vascular: dorsalis pedis and posterior tibial pulses are palpable bilateral. Capillary return is immediate. Temperature gradient is WNL. Skin turgor WNL  Sensorium: Normal Semmes Weinstein monofilament test. Normal tactile sensation bilaterally. Nail Exam: Pt has thick disfigured discolored nails with subungual debris noted bilateral entire nail hallux through fifth toenails Ulcer Exam: There is no evidence of ulcer or pre-ulcerative changes or infection. Orthopedic Exam: Muscle tone and strength are WNL. No limitations in general ROM. No crepitus or effusions noted. HAV  B/L.  Hammer toes 2-5  B/L. Skin: No Porokeratosis. No infection or ulcers  Assessment & Plan:  Patient was evaluated and treated and all questions answered.  Onychomycosis with pain  -Nails palliatively debrided as below. -Educated on self-care  Procedure: Nail Debridement Rationale: pain  Type of Debridement: manual, sharp debridement. Instrumentation: Nail nipper, rotary burr. Number of Nails:  10  Procedures and Treatment: Consent by patient was obtained for treatment procedures. The patient understood the discussion of treatment and procedures well. All questions were answered thoroughly reviewed. Debridement of mycotic and hypertrophic toenails, 1 through 5 bilateral and clearing of subungual debris. No ulceration, no infection noted.  Return Visit-Office Procedure: Patient instructed to return to the office for a follow up visit 3 months for continued evaluation and treatment.  Boneta Lucks, DPM    No follow-ups on file.

## 2019-09-12 NOTE — Progress Notes (Signed)
Cardiology Office Note   Date:  09/14/2019   ID:  Keshon, Lefler 1933/02/02, MRN [id number]  PCP:  Gayland Curry, DO  Cardiologist:  Peter Martinique, MD EP: None  Chief Complaint  Patient presents with  . Shortness of Breath  . Edema    feet     History of Present Illness: [name] is a 85 y.o. female with a PMH of paroxysmal atrial fibrillation, HTN, PVCs, and mild-moderate AI, who presents for LE edema.  She was last evaluated by cardiology at an outpatient visit with Dr. Martinique 04/2017, at which time she was without cardiac complaints, though generally felt unwell and noted poor quality of life. She was continued on flecainide and eliquis for atrial fibrillation. Echo in 2018 showed EF 55-60%, no RWMA, G1DD, and mild-moderate AI.   Diagnosed with lung mass c/f malignancy earlier this year though is not undergoing any further work-up or management. Over the past several months she has noticed LE edema which is worse over the last couple weeks. Also with some SOB and orthopnea. She has been taking lasix 20mg  daily but denies significant UOP. No complaints of chest pain, dizziness, lightheadedness, syncope, PND, or palpitations. She reports compliance with her eliquis, and though she has bruising, no complaints of overt bleeding. We discussed the importance of a low sodium diet and she reports loving Doritos.    Past Medical History:  Diagnosis Date  . A-fib ([place])   . Abdominal pain, unspecified site   . Abnormality of gait 06/02/2013  . Arthritis   . Cataracts, bilateral   . Depression   . Diverticulosis of colon (without mention of hemorrhage)   . Diverticulosis of colon (without mention of hemorrhage) 10/27/2012  . Edema 10/27/2012  . GI bleed 06/2002  . Hemorrhage of gastrointestinal tract, unspecified   . History of stomach ulcers   . Hypertension   . Insomnia, unspecified 10/27/2012  . Localized osteoarthrosis not specified whether primary or secondary, lower leg  05/31/2011  . Migraines   . Nausea alone 10/28/2012  . Obesity, unspecified 10/27/2012  . Osteoarthrosis, unspecified whether generalized or localized, lower leg   . Other acariasis    peripherial neuropathy  . Other and unspecified alcohol dependence, continuous drinking behavior 10/28/2012  . Palpitations   . PVC (premature ventricular contraction)   . Unspecified hereditary and idiopathic peripheral neuropathy   . UTI (lower urinary tract infection)    pt state uti w/ no pain    Past Surgical History:  Procedure Laterality Date  . ARTHROSCOPIC REPAIR ACL    . BREAST BIOPSY  1973   left  . BREAST REDUCTION SURGERY  12/23/2003  . cataracts    . CHOLECYSTECTOMY  2005  . TONSILLECTOMY AND ADENOIDECTOMY  1941     Current Outpatient Medications  Medication Sig Dispense Refill  . acetaminophen (TYLENOL) 325 MG tablet Take 750-1,000 mg by mouth See admin instructions. Take 1000 mg in the morning and take 750 mg at night    . allopurinol (ZYLOPRIM) 100 MG tablet TAKE 1 TABLET BY MOUTH DAILY 90 tablet 1  . apixaban (ELIQUIS) 5 MG TABS tablet Take 1 tablet (5 mg total) by mouth 2 (two) times daily. 180 tablet 1  . Ascorbic Acid (VITAMIN C) 100 MG tablet Take 100 mg by mouth daily.    . Cholecalciferol (VITAMIN D3) 250 MCG (10000 UT) capsule Take 10,000 Units by mouth daily.    Marland Kitchen docusate sodium (COLACE) 100 MG capsule  Take 1 capsule (100 mg total) by mouth daily. 10 capsule 0  . flecainide (TAMBOCOR) 50 MG tablet TAKE 1 TABLET BY MOUTH TWICE DAILY FOR ATRIAL FIBRILLATION 180 tablet 1  . furosemide (LASIX) 20 MG tablet Take 3 tablets (60 mg total) by mouth daily. 30 tablet 0  . irbesartan (AVAPRO) 75 MG tablet TAKE 1 TABLET BY MOUTH DAILY 90 tablet 1  . Melatonin 5 MG TABS Take 5 mg by mouth at bedtime.    . nebivolol (BYSTOLIC) 10 MG tablet Take 1 tablet (10 mg total) by mouth daily. 90 tablet 1  . ondansetron (ZOFRAN) 4 MG tablet Take 1 tablet (4 mg total) by mouth 2 (two) times daily as  needed for nausea. 20 tablet 0  . Phenylephrine HCl (NEO-SYNEPHRINE NA) Place 1 spray into the nose 2 (two) times daily as needed (congestion).    . traZODone (DESYREL) 50 MG tablet Take 0.5-1 tablets (25-50 mg total) by mouth at bedtime as needed for sleep. 30 tablet 3  . UNABLE TO FIND Med Name: check weight three times per week on Monday,Wednesday and Friday.Notify provider for abrupt weight gain > 3 lbs from her baseline 198 lbs. 1 Act 0  . zolpidem (AMBIEN) 5 MG tablet TAKE 1 TABLET BY MOUTH EVERY NIGHT AT BEDTIME FOR SLEEP 30 tablet 5   No current facility-administered medications for this visit.    Allergies:   Mirtazapine, Nickel, Other, and Prednisone    Social History:  The patient  reports that she quit smoking about 35 years ago. Her smoking use included cigarettes. She has a 30.00 pack-year smoking history. She has never used smokeless tobacco. She reports current alcohol use of about 1.0 - 2.0 standard drinks of alcohol per week. She reports that she does not use drugs.   Family History:  The patient's family history includes Breast cancer in her mother; Hypertension in her father and mother.    ROS:  Please see the history of present illness.   Otherwise, review of systems are positive for none.   All other systems are reviewed and negative.    PHYSICAL EXAM: VS:  BP 136/79   Pulse 94   Temp 97.7 F (36.5 C)   Ht 5\' 4"  (1.626 m)   Wt 199 lb 3.2 oz (90.4 kg)   LMP  (LMP Unknown)   SpO2 92%   BMI 34.19 kg/m  , BMI Body mass index is 34.19 kg/m. GEN: Well nourished, well developed, in no acute distress HEENT: normal Neck: no JVD, carotid bruits, or masses Cardiac: IRIR; +murmur, no rubs or gallops, 2+ LE edema  Respiratory:  clear to auscultation bilaterally, normal work of breathing GI: soft, nontender, nondistended, + BS MS: no deformity or atrophy Skin: warm and dry, no rash Neuro:  Strength and sensation are intact Psych: euthymic mood, full affect   EKG:   EKG is ordered today. The ekg ordered today demonstrates atrial fibrillation with rate 94 bpm, RBBB, no STE/D, no TWI.    Recent Labs: 07/30/2019: Hemoglobin 8.8; Platelets 198    Lipid Panel    Component Value Date/Time   CHOL 198 04/08/2018 1037   TRIG 87 04/08/2018 1037   HDL 74 04/08/2018 1037   CHOLHDL 2.7 04/08/2018 1037   LDLCALC 106 (H) 04/08/2018 1037      Wt Readings from Last 3 Encounters:  09/13/19 199 lb 3.2 oz (90.4 kg)  08/27/19 200 lb (90.7 kg)  07/30/19 198 lb 12.8 oz (90.2 kg)  Other studies Reviewed: Additional studies/ records that were reviewed today include:   Echocardiogram 02/2017: Study Conclusions  - Left ventricle: The cavity size was normal. Wall thickness was increased in a pattern of moderate LVH. There was focal basal hypertrophy. Systolic function was normal. The estimated ejection fraction was in the range of 55% to 60%. Wall motion was normal; there were no regional wall motion abnormalities. Doppler parameters are consistent with abnormal left ventricular relaxation (grade 1 diastolic dysfunction). - Aortic valve: Mildly calcified annulus. There was mild to   moderate regurgitation. Mitral valve: Valve area by pressure half-time: 1.88 cm^2.    ASSESSMENT AND PLAN:  1. Acute on chronic diastolic CHF: patient with significant LE edema on exam and complaints of SOB and orthopnea. Weight is up Last echo in 2018 showed EF 55-60% and G1DD. Also noted to be in atrial fibrillation at today's visit which could be contributing.  - Will update her echocardiogram - Recommend lasix 60mg  daily until follow-up next week, at which time we will reassess symptoms/swelling and check a BMET. - Continue nebivolol and irbesartan - We discussed the importance of a low sodium diet and monitoring her weight daily.   2. Paroxysmal atrial fibrillation: EKG revealed atrial fibrillation with rate 94 bpm today. Possible this is contributing to #1. She is unaware  of her Afib so it is unclear how long she has been in this rhythm.  - Continue flecainide for rhythm control - Continue nebivolol for rate control - Continue eliquis for stroke ppx. Does not meet criteria for reduced dosing.  - Could consider DCCV if she is persistently in afib at her follow-up visit next week.   3. HTN: BP 136/79 today - Continue nebivolol and irbesartan  4. Aortic insufficiency: mild-moderate on echo 02/2017 - Will update an echo   5. PVCs: no complaints of palpitations. No PVCs on EKG today.  - Continue nebivolol     Current medicines are reviewed at length with the patient today.  The patient does not have concerns regarding medicines.  The following changes have been made:  As above  Labs/ tests ordered today include:   Orders Placed This Encounter  Procedures  . EKG 12-Lead  . ECHOCARDIOGRAM COMPLETE     Disposition:   FU with me 09/22/2019   Signed, Abigail Butts, PA-C  09/14/2019 4:28 PM

## 2019-09-13 ENCOUNTER — Ambulatory Visit (INDEPENDENT_AMBULATORY_CARE_PROVIDER_SITE_OTHER): Payer: Medicare Other | Admitting: Medical

## 2019-09-13 ENCOUNTER — Other Ambulatory Visit: Payer: Self-pay

## 2019-09-13 ENCOUNTER — Encounter: Payer: Self-pay | Admitting: Medical

## 2019-09-13 VITALS — BP 136/79 | HR 94 | Temp 97.7°F | Ht 64.0 in | Wt 199.2 lb

## 2019-09-13 DIAGNOSIS — I10 Essential (primary) hypertension: Secondary | ICD-10-CM | POA: Diagnosis not present

## 2019-09-13 DIAGNOSIS — I5033 Acute on chronic diastolic (congestive) heart failure: Secondary | ICD-10-CM | POA: Diagnosis not present

## 2019-09-13 DIAGNOSIS — I351 Nonrheumatic aortic (valve) insufficiency: Secondary | ICD-10-CM

## 2019-09-13 DIAGNOSIS — I493 Ventricular premature depolarization: Secondary | ICD-10-CM | POA: Diagnosis not present

## 2019-09-13 DIAGNOSIS — I480 Paroxysmal atrial fibrillation: Secondary | ICD-10-CM

## 2019-09-13 MED ORDER — FUROSEMIDE 20 MG PO TABS: 60.0000 mg | ORAL_TABLET | Freq: Every day | ORAL | 0 refills | Status: DC

## 2019-09-13 NOTE — Patient Instructions (Addendum)
Medication Instructions:  Your physician has recommended you make the following change in your medication:   INCREASE YOUR LASIX TO 60 MG BY MOUTH DAILY  *If you need a refill on your cardiac medications before your next appointment, please call your pharmacy*  Lab Work: NONE If you have labs (blood work) drawn today and your tests are completely normal, you will receive your results only by: Marland Kitchen MyChart Message (if you have MyChart) OR . A paper copy in the mail If you have any lab test that is abnormal or we need to change your treatment, we will call you to review the results.  Testing/Procedures: Your physician has requested that you have an echocardiogram. Echocardiography is a painless test that uses sound waves to create images of your heart. It provides your doctor with information about the size and shape of your heart and how well your heart's chambers and valves are working. This procedure takes approximately one hour. There are no restrictions for this procedure. LOCATION: [place] at [facility]: [place], [place], [place] [address]  TO BE SCHEDULED BEFORE 09/22/2019 IF POSSIBLE   Follow-Up: At [facility], you and your health needs are our priority.  As part of our continuing mission to provide you with exceptional heart care, we have created designated Provider Care Teams.  These Care Teams include your primary Cardiologist (physician) and Advanced Practice Providers (APPs -  Physician Assistants and Nurse Practitioners) who all work together to provide you with the care you need, when you need it.  Your next appointment:   1 week(s) -----Wednesday September 22, 2019  The format for your next appointment:   In Person  Provider:   Roby Lofts, PA-C  Other Instructions  [place]. BRING THIS WITH YOU TO YOUR UPCOMING APPOINTMENT.  PLEASE PURCHASE COMPRESSION STOCKINGS.   Low-Sodium Eating Plan Sodium, which  is an element that makes up salt, helps you maintain a healthy balance of fluids in your body. Too much sodium can increase your blood pressure and cause fluid and waste to be held in your body. Your health care provider or dietitian may recommend following this plan if you have high blood pressure (hypertension), kidney disease, liver disease, or heart failure. Eating less sodium can help lower your blood pressure, reduce swelling, and protect your heart, liver, and kidneys. What are tips for following this plan? General guidelines  Most people on this plan should limit their sodium intake to 1,500-2,000 mg (milligrams) of sodium each day. Reading food labels   The Nutrition Facts label lists the amount of sodium in one serving of the food. If you eat more than one serving, you must multiply the listed amount of sodium by the number of servings.  Choose foods with less than 140 mg of sodium per serving.  Avoid foods with 300 mg of sodium or more per serving. Shopping  Look for lower-sodium products, often labeled as "low-sodium" or "no salt added."  Always check the sodium content even if foods are labeled as "unsalted" or "no salt added".  Buy fresh foods. ? Avoid canned foods and premade or frozen meals. ? Avoid canned, cured, or processed meats  Buy breads that have less than 80 mg of sodium per slice. Cooking  Eat more home-cooked food and less restaurant, buffet, and fast food.  Avoid adding salt when cooking. Use salt-free seasonings or herbs instead of table salt or sea salt. Check with your health  care provider or pharmacist before using salt substitutes.  Cook with plant-based oils, such as canola, sunflower, or olive oil. Meal planning  When eating at a restaurant, ask that your food be prepared with less salt or no salt, if possible.  Avoid foods that contain MSG (monosodium glutamate). MSG is sometimes added to [place] food, bouillon, and some canned foods. What  foods are recommended? The items listed may not be a complete list. Talk with your dietitian about what dietary choices are best for you. Grains Low-sodium cereals, including oats, puffed wheat and rice, and shredded wheat. Low-sodium crackers. Unsalted rice. Unsalted pasta. Low-sodium bread. Whole-grain breads and whole-grain pasta. Vegetables Fresh or frozen vegetables. "No salt added" canned vegetables. "No salt added" tomato sauce and paste. Low-sodium or reduced-sodium tomato and vegetable juice. Fruits Fresh, frozen, or canned fruit. Fruit juice. Meats and other protein foods Fresh or frozen (no salt added) meat, poultry, seafood, and fish. Low-sodium canned tuna and salmon. Unsalted nuts. Dried peas, beans, and lentils without added salt. Unsalted canned beans. Eggs. Unsalted nut butters. Dairy Milk. Soy milk. Cheese that is naturally low in sodium, such as ricotta cheese, fresh mozzarella, or Swiss cheese Low-sodium or reduced-sodium cheese. Cream cheese. Yogurt. Fats and oils Unsalted butter. Unsalted margarine with no trans fat. Vegetable oils such as canola or olive oils. Seasonings and other foods Fresh and dried herbs and spices. Salt-free seasonings. Low-sodium mustard and ketchup. Sodium-free salad dressing. Sodium-free light mayonnaise. Fresh or refrigerated horseradish. Lemon juice. Vinegar. Homemade, reduced-sodium, or low-sodium soups. Unsalted popcorn and pretzels. Low-salt or salt-free chips. What foods are not recommended? The items listed may not be a complete list. Talk with your dietitian about what dietary choices are best for you. Grains Instant hot cereals. Bread stuffing, pancake, and biscuit mixes. Croutons. Seasoned rice or pasta mixes. Noodle soup cups. Boxed or frozen macaroni and cheese. Regular salted crackers. Self-rising flour. Vegetables Sauerkraut, pickled vegetables, and relishes. Olives. [place] fries. Onion rings. Regular canned vegetables (not low-sodium  or reduced-sodium). Regular canned tomato sauce and paste (not low-sodium or reduced-sodium). Regular tomato and vegetable juice (not low-sodium or reduced-sodium). Frozen vegetables in sauces. Meats and other protein foods Meat or fish that is salted, canned, smoked, spiced, or pickled. Bacon, ham, sausage, hotdogs, corned beef, chipped beef, packaged lunch meats, salt pork, jerky, pickled herring, anchovies, regular canned tuna, sardines, salted nuts. Dairy Processed cheese and cheese spreads. Cheese curds. Blue cheese. Feta cheese. String cheese. Regular cottage cheese. Buttermilk. Canned milk. Fats and oils Salted butter. Regular margarine. Ghee. Bacon fat. Seasonings and other foods Onion salt, garlic salt, seasoned salt, table salt, and sea salt. Canned and packaged gravies. Worcestershire sauce. Tartar sauce. Barbecue sauce. Teriyaki sauce. Soy sauce, including reduced-sodium. Steak sauce. Fish sauce. Oyster sauce. Cocktail sauce. Horseradish that you find on the shelf. Regular ketchup and mustard. Meat flavorings and tenderizers. Bouillon cubes. Hot sauce and Tabasco sauce. Premade or packaged marinades. Premade or packaged taco seasonings. Relishes. Regular salad dressings. Salsa. Potato and tortilla chips. Corn chips and puffs. Salted popcorn and pretzels. Canned or dried soups. Pizza. Frozen entrees and pot pies. Summary  Eating less sodium can help lower your blood pressure, reduce swelling, and protect your heart, liver, and kidneys.  Most people on this plan should limit their sodium intake to 1,500-2,000 mg (milligrams) of sodium each day.  Canned, boxed, and frozen foods are high in sodium. Restaurant foods, fast foods, and pizza are also very high in sodium. You also get sodium  by adding salt to food.  Try to cook at home, eat more fresh fruits and vegetables, and eat less fast food, canned, processed, or prepared foods. This information is not intended to replace advice given to  you by your health care provider. Make sure you discuss any questions you have with your health care provider. Document Released: 03/01/2002 Document Revised: 08/22/2017 Document Reviewed: 09/02/2016 Elsevier Patient Education  [place].   How to Use Compression Stockings Compression stockings are elastic socks that squeeze the legs. They help increase blood flow (circulation) to the legs, decrease swelling in the legs, and reduce the chance of developing blood clots in the lower legs. Compression stockings are often used by people who:  Are recovering from surgery.  Have poor circulation in their legs.  Tend to get blood clots in their legs.  Have bulging (varicose) veins.  Sit or stay in bed for long periods of time. Follow instructions from your health care provider about how and when to wear your compression stockings. How to wear compression stockings Before you put on your compression stockings:  Make sure that they are the correct size and degree of compression. If you do not know your size or required grade of compression, ask your health care provider and follow the manufacturer's instructions that come with the stockings.  Make sure that they are clean, dry, and in good condition.  Check them for rips and tears. Do not put them on if they are ripped or torn. Put your stockings on first thing in the morning, before you get out of bed. Keep them on for as long as your health care provider advises. When you are wearing your stockings:  Keep them as smooth as possible. Do not allow them to bunch up. It is especially important to prevent the stockings from bunching up around your toes or behind your knees.  Do not roll the stockings downward and leave them rolled down. This can decrease blood flow to your leg.  Change them right away if they become wet or dirty. When you take off your stockings, inspect your legs and feet. Check for:  Open sores.  Red  spots.  Swelling. General tips  Do not stop wearing compression stockings without talking to your health care provider first.  Wash your stockings every day with mild detergent in cold or warm water. Do not use bleach. Air-dry your stockings or dry them in a clothes dryer on low heat. It may be helpful to have two pairs so that you have a pair to wear while the other is being washed.  Replace your stockings every 3-6 months.  If skin moisturizing is part of your treatment plan, apply lotion or cream at night so that your skin will be dry when you put on the stockings in the morning. It is harder to put the stockings on when you have lotion on your legs or feet.  Wear nonskid shoes or slip-resistant socks when walking while wearing compression stockings. Contact a health care provider and remove your stockings if you have:  A feeling of pins and needles in your feet or legs.  Open sores, red spots, or other skin changes on your feet or legs.  Swelling or pain that gets worse. Get help right away if you have:  Numbness or tingling in your lower legs that does not get better right after you take the stockings off.  Toes or feet that are unusually cold or turn a bluish color.  A warm or red area on your leg.  New swelling or soreness in your leg.  Shortness of breath.  Chest pain.  A fast or irregular heartbeat.  Light-headedness.  Dizziness. Summary  Compression stockings are elastic socks that squeeze the legs.  They help increase blood flow (circulation) to the legs, decrease swelling in the legs, and reduce the chance of developing blood clots in the lower legs.  Follow instructions from your health care provider about how and when to wear your compression stockings.  Do not stop wearing your compression stockings without talking to your health care provider first. This information is not intended to replace advice given to you by your health care provider. Make sure  you discuss any questions you have with your health care provider. Document Released: 07/07/2009 Document Revised: 09/11/2017 Document Reviewed: 09/11/2017 Elsevier Patient Education  2020 [employer].

## 2019-09-14 ENCOUNTER — Encounter: Payer: Self-pay | Admitting: Medical

## 2019-09-21 NOTE — Progress Notes (Signed)
Cardiology Office Note   Date:  09/22/2019   ID:  Martha, [name] [birth year], MRN [id number]  PCP:  Gayland Curry, DO  Cardiologist:  Peter Martinique, MD EP: None  Chief Complaint  Patient presents with  . Follow-up  . Edema    feet and leg   . Shortness of Breath      History of Present Illness: [name] is a 85 y.o. female PMH of chronic diastolic CHF, paroxysmal atrial fibrillation, HTN, PVCs, mild-moderate AI, and recently diagnosed lung mass c/f malignancy for which she is not undergoing any further work-up/management, who presents for follow-up of her CHF.  She was last evaluated by cardiology at an outpatient visit with myself 09/13/2019, at which time she had complaints of LE edema, SOB, and orthopnea despite low does lasix. She was recommended to increase her lasix to 60mg  daily, monitor weights, and limit salt intake.  Echo in 2018 showed EF 55-60%, no RWMA, G1DD, and mild-moderate AI. She was recommended to undergo repeat echo which was performed this morning.   She presents today for follow-up of her CHF. She reports improvement in her weight and LE edema, though still feeling SOB with minimal activity. She has cut down on her salty snacks. She reports occasional chest pressure. No complaints of palpitations. She was happy to hear she is back in sinus rhythm. Her main complaint today is her insomnia. She has only been sleeping a few hours at night. Her Lorrin Mais was reduced and she was placed on low dose trazodone which is not helping. She asks for a refill of the trazodone today and was recommended to contact the NP who initially prescribed this medication.     Past Medical History:  Diagnosis Date  . A-fib ([place])   . Abdominal pain, unspecified site   . Abnormality of gait 06/02/2013  . Arthritis   . Cataracts, bilateral   . Depression   . Diverticulosis of colon (without mention of hemorrhage)   . Diverticulosis of colon (without mention of hemorrhage)  10/27/2012  . Edema 10/27/2012  . GI bleed 06/2002  . Hemorrhage of gastrointestinal tract, unspecified   . History of stomach ulcers   . Hypertension   . Insomnia, unspecified 10/27/2012  . Localized osteoarthrosis not specified whether primary or secondary, lower leg 05/31/2011  . Migraines   . Nausea alone 10/28/2012  . Obesity, unspecified 10/27/2012  . Osteoarthrosis, unspecified whether generalized or localized, lower leg   . Other acariasis    peripherial neuropathy  . Other and unspecified alcohol dependence, continuous drinking behavior 10/28/2012  . Palpitations   . PVC (premature ventricular contraction)   . Unspecified hereditary and idiopathic peripheral neuropathy   . UTI (lower urinary tract infection)    pt state uti w/ no pain    Past Surgical History:  Procedure Laterality Date  . ARTHROSCOPIC REPAIR ACL    . BREAST BIOPSY  1973   left  . BREAST REDUCTION SURGERY  12/23/2003  . cataracts    . CHOLECYSTECTOMY  2005  . TONSILLECTOMY AND ADENOIDECTOMY  1941     Current Outpatient Medications  Medication Sig Dispense Refill  . acetaminophen (TYLENOL) 325 MG tablet Take 750-1,000 mg by mouth See admin instructions. Take 1000 mg in the morning and take 750 mg at night    . allopurinol (ZYLOPRIM) 100 MG tablet TAKE 1 TABLET BY MOUTH DAILY 90 tablet 1  . apixaban (ELIQUIS) 5 MG TABS tablet Take 1 tablet (5  mg total) by mouth 2 (two) times daily. 180 tablet 1  . Ascorbic Acid (VITAMIN C) 100 MG tablet Take 100 mg by mouth daily.    . Cholecalciferol (VITAMIN D3) 250 MCG (10000 UT) capsule Take 10,000 Units by mouth daily.    Marland Kitchen docusate sodium (COLACE) 100 MG capsule Take 1 capsule (100 mg total) by mouth daily. 10 capsule 0  . flecainide (TAMBOCOR) 50 MG tablet TAKE 1 TABLET BY MOUTH TWICE DAILY FOR ATRIAL FIBRILLATION 180 tablet 1  . furosemide (LASIX) 20 MG tablet Take 3 tablets (60 mg total) by mouth daily. 90 tablet 10  . irbesartan (AVAPRO) 75 MG tablet TAKE 1 TABLET BY  MOUTH DAILY 90 tablet 1  . Melatonin 5 MG TABS Take 5 mg by mouth at bedtime.    . nebivolol (BYSTOLIC) 10 MG tablet Take 1 tablet (10 mg total) by mouth daily. 90 tablet 1  . ondansetron (ZOFRAN) 4 MG tablet Take 1 tablet (4 mg total) by mouth 2 (two) times daily as needed for nausea. 20 tablet 0  . Phenylephrine HCl (NEO-SYNEPHRINE NA) Place 1 spray into the nose 2 (two) times daily as needed (congestion).    . traZODone (DESYREL) 50 MG tablet Take 0.5-1 tablets (25-50 mg total) by mouth at bedtime as needed for sleep. 30 tablet 3  . UNABLE TO FIND Med Name: check weight three times per week on Monday,Wednesday and Friday.Notify provider for abrupt weight gain > 3 lbs from her baseline 198 lbs. 1 Act 0  . zolpidem (AMBIEN) 5 MG tablet TAKE 1 TABLET BY MOUTH EVERY NIGHT AT BEDTIME FOR SLEEP 30 tablet 5   No current facility-administered medications for this visit.    Allergies:   Mirtazapine, Nickel, Other, and Prednisone    Social History:  The patient  reports that she quit smoking about 35 years ago. Her smoking use included cigarettes. She has a 30.00 pack-year smoking history. She has never used smokeless tobacco. She reports current alcohol use of about 1.0 - 2.0 standard drinks of alcohol per week. She reports that she does not use drugs.   Family History:  The patient's family history includes Breast cancer in her mother; Hypertension in her father and mother.    ROS:  Please see the history of present illness.   Otherwise, review of systems are positive for none.   All other systems are reviewed and negative.    PHYSICAL EXAM: VS:  BP 122/61   Pulse 65   Temp 97.7 F (36.5 C)   Ht 5\' 4"  (1.626 m)   Wt 191 lb 9.6 oz (86.9 kg)   LMP  (LMP Unknown)   SpO2 97%   BMI 32.89 kg/m  , BMI Body mass index is 32.89 kg/m. GEN: Well nourished, well developed, in no acute distress HEENT: sclera anicteric Neck: no JVD, carotid bruits, or masses Cardiac: RRR; no murmurs, rubs, or  gallops,no edema  Respiratory:  clear to auscultation bilaterally, normal work of breathing GI: soft, nontender, nondistended, + BS MS: no deformity or atrophy Skin: warm and dry, no rash Neuro:  Strength and sensation are intact Psych: euthymic mood, full affect   EKG:  EKG is ordered today. The ekg ordered today demonstrates sinus rhythm with 1st degree AV block, rate 75 bpm, no STE/D, no TWI   Recent Labs: 07/30/2019: Hemoglobin 8.8; Platelets 198    Lipid Panel    Component Value Date/Time   CHOL 198 04/08/2018 1037   TRIG 87 04/08/2018 1037  HDL 74 04/08/2018 1037   CHOLHDL 2.7 04/08/2018 1037   LDLCALC 106 (H) 04/08/2018 1037      Wt Readings from Last 3 Encounters:  09/22/19 191 lb 9.6 oz (86.9 kg)  09/13/19 199 lb 3.2 oz (90.4 kg)  08/27/19 200 lb (90.7 kg)      Other studies Reviewed: Additional studies/ records that were reviewed today include:   Echocardiogram 02/2017: Study Conclusions  - Left ventricle: The cavity size was normal. Wall thickness wasincreased in a pattern of moderate LVH. There was focal basalhypertrophy. Systolic function was normal. The estimated ejectionfraction was in the range of 55% to 60%. Wall motion was normal;there were no regional wall motion abnormalities. Dopplerparameters are consistent with abnormal left ventricularrelaxation (grade 1 diastolic dysfunction). - Aortic valve: Mildly calcified annulus. There was mild to moderate regurgitation. Mitral valve: Valve area by pressure half-time: 1.88 cm^2.    ASSESSMENT AND PLAN:  1. Acute on chronic diastolic CHF: weight is trending down and LE edema has improved. Still with SOB. Last echo in 2018 showed EF 55-60% and G1DD.  - Await echocardiogram report from today - attempted to get results to review in office, however patient could not wait.  - Recommend continuing lasix 60mg  daily  - Will check a BMET/BNP today - Continue nebivolol and irbesartan - We discussed the  importance of a low sodium diet and monitoring her weight daily.   2. Paroxysmal atrial fibrillation: She is back in sinus rhythm today - Continue flecainide for rhythm control - Continue nebivolol for rate control - Continue eliquis for stroke ppx. Does not meet criteria for reduced dosing.   3. HTN: BP 122/61 today - Continue nebivolol and irbesartan  4. Aortic insufficiency: mild-moderate on echo in 2018 - Await echo report from today  5. PVCs: no complaints of palpitations. No PVCs on EKG today.  - Continue nebivolol   Current medicines are reviewed at length with the patient today.  The patient does not have concerns regarding medicines.  The following changes have been made:  no change  Labs/ tests ordered today include:   Orders Placed This Encounter  Procedures  . Basic Metabolic Panel (BMET)  . B Nat Peptide  . EKG 12-Lead     Disposition:   FU with Dr. Martinique in 1 month  Signed, Abigail Butts, PA-C  09/22/2019 2:27 PM

## 2019-09-22 ENCOUNTER — Ambulatory Visit (INDEPENDENT_AMBULATORY_CARE_PROVIDER_SITE_OTHER): Payer: Medicare Other | Admitting: Medical

## 2019-09-22 ENCOUNTER — Ambulatory Visit (HOSPITAL_COMMUNITY): Payer: Medicare Other | Attending: Cardiology

## 2019-09-22 ENCOUNTER — Encounter: Payer: Self-pay | Admitting: Medical

## 2019-09-22 ENCOUNTER — Other Ambulatory Visit: Payer: Self-pay

## 2019-09-22 VITALS — BP 122/61 | HR 65 | Temp 97.7°F | Ht 64.0 in | Wt 191.6 lb

## 2019-09-22 DIAGNOSIS — I5033 Acute on chronic diastolic (congestive) heart failure: Secondary | ICD-10-CM | POA: Diagnosis present

## 2019-09-22 DIAGNOSIS — I480 Paroxysmal atrial fibrillation: Secondary | ICD-10-CM | POA: Diagnosis not present

## 2019-09-22 DIAGNOSIS — I351 Nonrheumatic aortic (valve) insufficiency: Secondary | ICD-10-CM | POA: Diagnosis not present

## 2019-09-22 DIAGNOSIS — I10 Essential (primary) hypertension: Secondary | ICD-10-CM

## 2019-09-22 DIAGNOSIS — I493 Ventricular premature depolarization: Secondary | ICD-10-CM

## 2019-09-22 MED ORDER — FUROSEMIDE 20 MG PO TABS: 60.0000 mg | ORAL_TABLET | Freq: Every day | ORAL | 10 refills | Status: DC

## 2019-09-22 NOTE — Patient Instructions (Signed)
Medication Instructions:  No changes / Lasix 60 mg has been sent to Pharmacy *If you need a refill on your cardiac medications before your next appointment, please call your pharmacy*  Lab Work:Your physician recommends that you return for lab work TODAY BMET/BNP   If you have labs (blood work) drawn today and your tests are completely normal, you will receive your results only by: Marland Kitchen MyChart Message (if you have MyChart) OR . A paper copy in the mail If you have any lab test that is abnormal or we need to change your treatment, we will call you to review the results.  Testing/Procedures:   Follow-Up: At [facility], you and your health needs are our priority.  As part of our continuing mission to provide you with exceptional heart care, we have created designated Provider Care Teams.  These Care Teams include your primary Cardiologist (physician) and Advanced Practice Providers (APPs -  Physician Assistants and Nurse Practitioners) who all work together to provide you with the care you need, when you need it.  Your next appointment:   1 month(s)  The format for your next appointment:   Either In Person or Virtual  Provider:   Peter Martinique, MD  Other Instructions We have scheduled you to follow up with Dr. Neita Garnet in Feb. 1, 2021@ 1140 Continues to have a low salt diet and follow the salty six recommendation handout ,.   We we call you regarding your echo report.

## 2019-09-23 LAB — BASIC METABOLIC PANEL
BUN/Creatinine Ratio: 15 (ref 12–28)
BUN: 14 mg/dL (ref 8–27)
CO2: 29 mmol/L (ref 20–29)
Calcium: 9 mg/dL (ref 8.7–10.3)
Chloride: 92 mmol/L — ABNORMAL LOW (ref 96–106)
Creatinine, Ser: 0.95 mg/dL (ref 0.57–1.00)
GFR calc Af Amer: 64 mL/min/{1.73_m2} (ref >59)
GFR calc non Af Amer: 55 mL/min/{1.73_m2} — ABNORMAL LOW (ref >59)
Glucose: 140 mg/dL — ABNORMAL HIGH (ref 65–99)
Potassium: 3.5 mmol/L (ref 3.5–5.2)
Sodium: 136 mmol/L (ref 134–144)

## 2019-09-23 LAB — BRAIN NATRIURETIC PEPTIDE: BNP: 397.9 pg/mL — ABNORMAL HIGH (ref 0.0–100.0)

## 2019-09-27 ENCOUNTER — Other Ambulatory Visit: Payer: Medicare Other

## 2019-09-27 ENCOUNTER — Other Ambulatory Visit: Payer: Self-pay | Admitting: *Deleted

## 2019-09-27 ENCOUNTER — Other Ambulatory Visit: Payer: Self-pay

## 2019-09-27 DIAGNOSIS — R739 Hyperglycemia, unspecified: Secondary | ICD-10-CM

## 2019-09-27 DIAGNOSIS — I700 Atherosclerosis of aorta: Secondary | ICD-10-CM

## 2019-09-27 DIAGNOSIS — R05 Cough: Secondary | ICD-10-CM

## 2019-09-27 DIAGNOSIS — R059 Cough, unspecified: Secondary | ICD-10-CM

## 2019-09-27 DIAGNOSIS — F1020 Alcohol dependence, uncomplicated: Secondary | ICD-10-CM

## 2019-09-27 DIAGNOSIS — I480 Paroxysmal atrial fibrillation: Secondary | ICD-10-CM

## 2019-09-27 DIAGNOSIS — M109 Gout, unspecified: Secondary | ICD-10-CM

## 2019-09-27 MED ORDER — IRBESARTAN 75 MG PO TABS: 75.0000 mg | ORAL_TABLET | Freq: Every day | ORAL | 1 refills | Status: DC

## 2019-09-27 MED ORDER — ALLOPURINOL 100 MG PO TABS: 100.0000 mg | ORAL_TABLET | Freq: Every day | ORAL | 1 refills | Status: DC

## 2019-09-27 NOTE — Telephone Encounter (Signed)
[facility] 

## 2019-09-28 ENCOUNTER — Telehealth: Payer: Self-pay

## 2019-09-28 ENCOUNTER — Other Ambulatory Visit: Payer: Self-pay

## 2019-09-28 LAB — LIPID PANEL
Cholesterol: 113 mg/dL (ref <200)
HDL: 39 mg/dL — ABNORMAL LOW (ref >=50)
LDL Cholesterol (Calc): 53 mg/dL (calc)
Non-HDL Cholesterol (Calc): 74 mg/dL (calc) (ref <130)
Total CHOL/HDL Ratio: 2.9 (calc) (ref <5.0)
Triglycerides: 129 mg/dL (ref <150)

## 2019-09-28 LAB — COMPLETE METABOLIC PANEL WITH GFR
AG Ratio: 1.6 (calc) (ref 1.0–2.5)
ALT: 7 U/L (ref 6–29)
AST: 10 U/L (ref 10–35)
Albumin: 3.9 g/dL (ref 3.6–5.1)
Alkaline phosphatase (APISO): 84 U/L (ref 37–153)
BUN/Creatinine Ratio: 16 (calc) (ref 6–22)
BUN: 14 mg/dL (ref 7–25)
CO2: 32 mmol/L (ref 20–32)
Calcium: 9 mg/dL (ref 8.6–10.4)
Chloride: 94 mmol/L — ABNORMAL LOW (ref 98–110)
Creat: 0.9 mg/dL — ABNORMAL HIGH (ref 0.60–0.88)
GFR, Est African American: 68 mL/min/{1.73_m2} (ref >=60)
GFR, Est Non African American: 59 mL/min/{1.73_m2} — ABNORMAL LOW (ref >=60)
Globulin: 2.4 g/dL (calc) (ref 1.9–3.7)
Glucose, Bld: 133 mg/dL — ABNORMAL HIGH (ref 65–99)
Potassium: 3.2 mmol/L — ABNORMAL LOW (ref 3.5–5.3)
Sodium: 138 mmol/L (ref 135–146)
Total Bilirubin: 0.6 mg/dL (ref 0.2–1.2)
Total Protein: 6.3 g/dL (ref 6.1–8.1)

## 2019-09-28 LAB — CBC WITH DIFFERENTIAL/PLATELET
Absolute Monocytes: 190 cells/uL — ABNORMAL LOW (ref 200–950)
Basophils Absolute: 0 cells/uL (ref 0–200)
Basophils Relative: 0 %
Eosinophils Absolute: 10 cells/uL — ABNORMAL LOW (ref 15–500)
Eosinophils Relative: 0.4 %
HCT: 23.3 % — ABNORMAL LOW (ref 35.0–45.0)
Hemoglobin: 7.4 g/dL — ABNORMAL LOW (ref 11.7–15.5)
Lymphs Abs: 1230 cells/uL (ref 850–3900)
MCH: 28.6 pg (ref 27.0–33.0)
MCHC: 31.8 g/dL — ABNORMAL LOW (ref 32.0–36.0)
MCV: 90 fL (ref 80.0–100.0)
MPV: 9.4 fL (ref 7.5–12.5)
Monocytes Relative: 7.6 %
Neutro Abs: 1070 cells/uL — ABNORMAL LOW (ref 1500–7800)
Neutrophils Relative %: 42.8 %
Platelets: 186 10*3/uL (ref 140–400)
RBC: 2.59 10*6/uL — ABNORMAL LOW (ref 3.80–5.10)
RDW: 16.6 % — ABNORMAL HIGH (ref 11.0–15.0)
Total Lymphocyte: 49.2 %
WBC: 2.5 10*3/uL — ABNORMAL LOW (ref 3.8–10.8)

## 2019-09-28 LAB — HEMOGLOBIN A1C
Hgb A1c MFr Bld: 5.6 % of total Hgb (ref <5.7)
Mean Plasma Glucose: 114 (calc)
eAG (mmol/L): 6.3 (calc)

## 2019-09-28 LAB — URIC ACID: Uric Acid, Serum: 7.2 mg/dL — ABNORMAL HIGH (ref 2.5–7.0)

## 2019-09-28 MED ORDER — POTASSIUM CHLORIDE CRYS ER 20 MEQ PO TBCR: EXTENDED_RELEASE_TABLET | ORAL | 11 refills | Status: DC

## 2019-09-28 NOTE — Telephone Encounter (Addendum)
Tried calling patient on the number listed for her home number the phone rings, beeps 2 times then hangs up. Will try calling patient again.    ----- Message from Abigail Butts, PA-C sent at 09/22/2019  4:55 PM EST ----- Please notify the patient that the ultrasound of her heart shows a good heart squeeze, however it is a little more stiff than her last ultrasound 2 years ago. The ultrasound also suggests she has extra fluid on board still. I would recommend that she take lasix 60mg  two times per day for 3 days, then resume 60mg  daily dosing going forward. There were no abnormalities in the heart movement to suggest decreased blood flow to the heart. Her aortic valve is mildly leaky which is stable from previous and does not require further evaluation at this time. Thank you!

## 2019-09-28 NOTE — Progress Notes (Signed)
We'll review at her appt tomorrow.  She's considerably more anemic.  Uric acid not at goal.  Potassium was low. Sugar average remains in normal range though glucose on labs was high.  Cholesterol is great.

## 2019-09-29 ENCOUNTER — Other Ambulatory Visit: Payer: Self-pay

## 2019-09-29 ENCOUNTER — Encounter: Payer: Self-pay | Admitting: Internal Medicine

## 2019-09-29 ENCOUNTER — Non-Acute Institutional Stay: Payer: Medicare Other | Admitting: Internal Medicine

## 2019-09-29 VITALS — BP 120/70 | HR 71 | Temp 98.1°F | Ht 64.0 in | Wt 190.2 lb

## 2019-09-29 DIAGNOSIS — R195 Other fecal abnormalities: Secondary | ICD-10-CM

## 2019-09-29 DIAGNOSIS — I480 Paroxysmal atrial fibrillation: Secondary | ICD-10-CM | POA: Diagnosis not present

## 2019-09-29 DIAGNOSIS — G4700 Insomnia, unspecified: Secondary | ICD-10-CM

## 2019-09-29 DIAGNOSIS — I5032 Chronic diastolic (congestive) heart failure: Secondary | ICD-10-CM

## 2019-09-29 DIAGNOSIS — R918 Other nonspecific abnormal finding of lung field: Secondary | ICD-10-CM | POA: Diagnosis not present

## 2019-09-29 DIAGNOSIS — D62 Acute posthemorrhagic anemia: Secondary | ICD-10-CM | POA: Diagnosis not present

## 2019-09-29 DIAGNOSIS — I712 Thoracic aortic aneurysm, without rupture, unspecified: Secondary | ICD-10-CM

## 2019-09-29 DIAGNOSIS — D649 Anemia, unspecified: Secondary | ICD-10-CM | POA: Diagnosis not present

## 2019-09-29 LAB — CBC AND DIFFERENTIAL
HCT: 23 — AB (ref 36–46)
Hemoglobin: 7.5 — AB (ref 12.0–16.0)
Platelets: 195 (ref 150–399)
WBC: 3

## 2019-09-29 LAB — CBC: RBC: 2.51 — AB (ref 3.87–5.11)

## 2019-09-29 MED ORDER — ZOLPIDEM TARTRATE 5 MG PO TABS: 5.0000 mg | ORAL_TABLET | Freq: Every day | ORAL | 5 refills | Status: DC

## 2019-09-29 NOTE — Progress Notes (Signed)
Location:  [facility] of Service:  Clinic (12)  Provider: Tirsa Gail L. Mariea Clonts, D.O., C.M.D.  Code Status: DNR Goals of Care:  Advanced Directives 08/27/2019  Does Patient Have a Medical Advance Directive? Yes  Type of Paramedic of [place];Out of facility DNR (pink MOST or yellow form);Living will  Does patient want to make changes to medical advance directive? No - Patient declined  Copy of [place] in Chart? Yes - validated most recent copy scanned in chart (See row information)  Would patient like information on creating a medical advance directive? -  Pre-existing out of facility DNR order (yellow form or pink MOST form) -     Chief Complaint  Patient presents with  . Medical Management of Chronic Issues    4 month follow up, patient is having sleep issues , would like to discuss general quality of life    HPI: Patient is a 86 y.o. female seen today for medical management of chronic diseases.    She cannot sleep and reports her quality of life is poor.   On labs, she's anemic. She is pale She is on her eliquis. Hgb has dropped from 11.6 nov of 2019 to 8.8 nov 6th of this year.   Jan it is 7.4. Has not noted dark stools or blood in the stool.  It's dark brown, but not black.  She is eating spinach and kale regularly. She's also cold. No abdominal pain or gerd here lately.  Has had prior ulcers and GI bleeding.   She has her thoracic aortic aneurysm and her lung mass, as well.  She's now on lasix 60mg  per day for chf now and has just seen cardiology.    Her intake is also down per Maudie Mercury, her caregiver.  She naps in the daytime more.  She never took naps before.  Pt notices her memory is not working as well.  She got a second notice on a bill and that does not happen to her.    She'd been in afib, but did convert spontaneously so she did not require cardioversion.  Past Medical History:  Diagnosis  Date  . A-fib ([place])   . Abdominal pain, unspecified site   . Abnormality of gait 06/02/2013  . Arthritis   . Cataracts, bilateral   . Depression   . Diverticulosis of colon (without mention of hemorrhage)   . Diverticulosis of colon (without mention of hemorrhage) 10/27/2012  . Edema 10/27/2012  . GI bleed 06/2002  . Hemorrhage of gastrointestinal tract, unspecified   . History of stomach ulcers   . Hypertension   . Insomnia, unspecified 10/27/2012  . Localized osteoarthrosis not specified whether primary or secondary, lower leg 05/31/2011  . Migraines   . Nausea alone 10/28/2012  . Obesity, unspecified 10/27/2012  . Osteoarthrosis, unspecified whether generalized or localized, lower leg   . Other acariasis    peripherial neuropathy  . Other and unspecified alcohol dependence, continuous drinking behavior 10/28/2012  . Palpitations   . PVC (premature ventricular contraction)   . Unspecified hereditary and idiopathic peripheral neuropathy   . UTI (lower urinary tract infection)    pt state uti w/ no pain    Past Surgical History:  Procedure Laterality Date  . ARTHROSCOPIC REPAIR ACL    . BREAST BIOPSY  1973   left  . BREAST REDUCTION SURGERY  12/23/2003  . cataracts    . CHOLECYSTECTOMY  2005  . TONSILLECTOMY AND  ADENOIDECTOMY  1941    Allergies  Allergen Reactions  . Mirtazapine     tremor  . Nickel Other (See Comments)    inflammation  . Other Itching    Acrylic nails, and trace metals  . Prednisone     Causes sleep disturbances     Outpatient Encounter Medications as of 09/29/2019  Medication Sig  . furosemide (LASIX) 20 MG tablet Take 3 tablets (60 mg total) by mouth daily.  . irbesartan (AVAPRO) 75 MG tablet Take 1 tablet (75 mg total) by mouth daily.  . Melatonin 5 MG TABS Take 5 mg by mouth at bedtime.  . nebivolol (BYSTOLIC) 10 MG tablet Take 1 tablet (10 mg total) by mouth daily.  . ondansetron (ZOFRAN) 4 MG tablet Take 1 tablet (4 mg total) by mouth 2 (two) times  daily as needed for nausea.  Marland Kitchen Phenylephrine HCl (NEO-SYNEPHRINE NA) Place 1 spray into the nose 2 (two) times daily as needed (congestion).  . potassium chloride SA (KLOR-CON) 20 MEQ tablet Take 1 tablet for 2 times a day for 3 days when taking Lasix 2 times a day for 3 days. Take 1 tablet daily on days Lasix is taken daily.  . traZODone (DESYREL) 50 MG tablet Take 0.5-1 tablets (25-50 mg total) by mouth at bedtime as needed for sleep.  Marland Kitchen UNABLE TO FIND Med Name: check weight three times per week on Monday,Wednesday and Friday.Notify provider for abrupt weight gain > 3 lbs from her baseline 198 lbs.  . zolpidem (AMBIEN) 5 MG tablet TAKE 1 TABLET BY MOUTH EVERY NIGHT AT BEDTIME FOR SLEEP  . acetaminophen (TYLENOL) 325 MG tablet Take 750-1,000 mg by mouth See admin instructions. Take 1000 mg in the morning and take 750 mg at night  . allopurinol (ZYLOPRIM) 100 MG tablet Take 1 tablet (100 mg total) by mouth daily.  Marland Kitchen apixaban (ELIQUIS) 5 MG TABS tablet Take 1 tablet (5 mg total) by mouth 2 (two) times daily.  . Ascorbic Acid (VITAMIN C) 100 MG tablet Take 100 mg by mouth daily.  . Cholecalciferol (VITAMIN D3) 250 MCG (10000 UT) capsule Take 10,000 Units by mouth daily.  Marland Kitchen docusate sodium (COLACE) 100 MG capsule Take 1 capsule (100 mg total) by mouth daily.  . flecainide (TAMBOCOR) 50 MG tablet TAKE 1 TABLET BY MOUTH TWICE DAILY FOR ATRIAL FIBRILLATION   No facility-administered encounter medications on file as of 09/29/2019.    Review of Systems:  Review of Systems  Constitutional: Positive for malaise/fatigue. Negative for chills and fever.  HENT: Negative for congestion and sore throat.   Eyes: Negative for blurred vision.  Respiratory: Positive for shortness of breath. Negative for cough and wheezing.   Cardiovascular: Positive for leg swelling. Negative for chest pain and palpitations.  Gastrointestinal: Positive for melena. Negative for abdominal pain, blood in stool, constipation,  diarrhea, heartburn, nausea and vomiting.  Genitourinary: Negative for dysuria.  Musculoskeletal: Negative for falls and joint pain.  Skin: Negative for rash.  Neurological: Positive for weakness. Negative for loss of consciousness.  Endo/Heme/Allergies: Bruises/bleeds easily.  Psychiatric/Behavioral: Positive for depression. The patient has insomnia.     Health Maintenance  Topic Date Due  . TETANUS/TDAP  09/23/2020  . INFLUENZA VACCINE  Completed  . DEXA SCAN  Completed  . PNA vac Low Risk Adult  Completed    Physical Exam: Vitals:   09/29/19 1053  BP: 120/70  Pulse: 71  Temp: 98.1 F (36.7 C)  TempSrc: Oral  SpO2: 91%  Weight: 190 lb 3.2 oz (86.3 kg)  Height: 5\' 4"  (1.626 m)   Body mass index is 32.65 kg/m. Physical Exam Vitals reviewed.  Constitutional:      General: She is not in acute distress.    Appearance: She is not ill-appearing or toxic-appearing.  HENT:     Head: Normocephalic and atraumatic.  Cardiovascular:     Rate and Rhythm: Rhythm irregular.     Pulses: Normal pulses.     Heart sounds: Normal heart sounds.  Pulmonary:     Effort: Pulmonary effort is normal.     Breath sounds: No rales.  Abdominal:     General: Bowel sounds are normal. There is no distension.     Palpations: Abdomen is soft. There is no mass.     Tenderness: There is no abdominal tenderness.  Genitourinary:    Rectum: Normal. Guaiac result positive.  Musculoskeletal:        General: Normal range of motion.     Right lower leg: Edema present.     Left lower leg: Edema present.     Comments: 1+ edema at best  Skin:    Coloration: Skin is pale.  Neurological:     General: No focal deficit present.     Mental Status: She is alert and oriented to person, place, and time.  Psychiatric:        Mood and Affect: Mood normal.     Labs reviewed: Basic Metabolic Panel: Recent Labs    09/22/19 1436 09/27/19 1053  NA 136 138  K 3.5 3.2*  CL 92* 94*  CO2 29 32  GLUCOSE  140* 133*  BUN 14 14  CREATININE 0.95 0.90*  CALCIUM 9.0 9.0   Liver Function Tests: Recent Labs    09/27/19 1053  AST 10  ALT 7  BILITOT 0.6  PROT 6.3   No results for input(s): LIPASE, AMYLASE in the last 8760 hours. No results for input(s): AMMONIA in the last 8760 hours. CBC: Recent Labs    07/30/19 1150 09/27/19 1053  WBC 4.4 2.5*  NEUTROABS 2,446 1,070*  HGB 8.8* 7.4*  HCT 28.0* 23.3*  MCV 95.9 90.0  PLT 198 186   Lipid Panel: Recent Labs    09/27/19 1053  CHOL 113  HDL 39*  LDLCALC 53  TRIG 129  CHOLHDL 2.9   Lab Results  Component Value Date   HGBA1C 5.6 09/27/2019    Procedures since last visit: ECHOCARDIOGRAM COMPLETE  Result Date: 09/22/2019   ECHOCARDIOGRAM REPORT   Patient Name:   JULAYNE SHREINER Date of Exam: 09/22/2019 Medical Rec #:  [id number]     Height:       64.0 in Accession #:    [id number]    Weight:       199.0 lb Date of Birth:  11-26-1932     BSA:          1.95 m Patient Age:    56 years      BP:           137/79 mmHg Patient Gender: F             HR:           68 bpm. Exam Location:  [facility] [facility] Procedure: 2D Echo, Cardiac Doppler and Color Doppler Indications:    I50.33 Acute on chronic diastolic (congestive) heart failure  History:        Patient has prior history of Echocardiogram examinations, most  recent 02/28/2017. Arrythmias:Atrial Fibrillation and PVC,                 Signs/Symptoms:Shortness of Breath; Risk Factors:Hypertension.                 Edema. Feet.  Sonographer:    Diamond Nickel RCS Referring Phys: [id number] Abigail Butts  Sonographer Comments: Technically difficult due to patient's discomfort while scanning in the apical view. IMPRESSIONS  1. Left ventricular ejection fraction, by visual estimation, is 60 to 65%. The left ventricle has normal function. There is moderately increased left ventricular hypertrophy.  2. Elevated left ventricular end-diastolic pressure.  3. Left ventricular diastolic parameters are  consistent with Grade II diastolic dysfunction (pseudonormalization).  4. The left ventricle has no regional wall motion abnormalities.  5. Global right ventricle has normal systolic function.The right ventricular size is normal. No increase in right ventricular wall thickness.  6. Left atrial size was moderately dilated.  7. Right atrial size was normal.  8. The mitral valve is normal in structure. Trivial mitral valve regurgitation. No evidence of mitral stenosis.  9. The tricuspid valve is normal in structure. 10. The aortic valve is normal in structure. Aortic valve regurgitation is mild. Mild to moderate aortic valve sclerosis/calcification without any evidence of aortic stenosis. 11. The pulmonic valve was normal in structure. Pulmonic valve regurgitation is not visualized. 12. Aneurysm of the ascending aorta. 13. There is mild dilatation of the ascending aorta measuring 41 mm. 14. The inferior vena cava is normal in size with greater than 50% respiratory variability, suggesting right atrial pressure of 3 mmHg. FINDINGS  Left Ventricle: Left ventricular ejection fraction, by visual estimation, is 60 to 65%. The left ventricle has normal function. The left ventricle has no regional wall motion abnormalities. There is moderately increased left ventricular hypertrophy. Concentric left ventricular hypertrophy. Left ventricular diastolic parameters are consistent with Grade II diastolic dysfunction (pseudonormalization). Elevated left ventricular end-diastolic pressure. Right Ventricle: The right ventricular size is normal. No increase in right ventricular wall thickness. Global RV systolic function is has normal systolic function. Left Atrium: Left atrial size was moderately dilated. Right Atrium: Right atrial size was normal in size Pericardium: There is no evidence of pericardial effusion. Mitral Valve: The mitral valve is normal in structure. Trivial mitral valve regurgitation. No evidence of mitral valve  stenosis by observation. Tricuspid Valve: The tricuspid valve is normal in structure. Tricuspid valve regurgitation is mild. Aortic Valve: The aortic valve is normal in structure. Aortic valve regurgitation is mild. Mild to moderate aortic valve sclerosis/calcification is present, without any evidence of aortic stenosis. Pulmonic Valve: The pulmonic valve was normal in structure. Pulmonic valve regurgitation is not visualized. Pulmonic regurgitation is not visualized. Aorta: The aortic root, ascending aorta and aortic arch are all structurally normal, with no evidence of dilitation or obstruction. There is mild dilatation of the ascending aorta measuring 41 mm. There is an aneurysm involving the ascending aorta. Venous: The inferior vena cava is normal in size with greater than 50% respiratory variability, suggesting right atrial pressure of 3 mmHg. IAS/Shunts: No atrial level shunt detected by color flow Doppler. There is no evidence of a patent foramen ovale. No ventricular septal defect is seen or detected. There is no evidence of an atrial septal defect.  LEFT VENTRICLE PLAX 2D LVIDd:         3.40 cm  Diastology LVIDs:         2.50 cm  LV e' lateral:   11.20  cm/s LV PW:         1.60 cm  LV E/e' lateral: 11.0 LV IVS:        1.40 cm  LV e' medial:    6.64 cm/s LVOT diam:     2.00 cm  LV E/e' medial:  18.5 LV SV:         25 ml LV SV Index:   12.21 LVOT Area:     3.14 cm  RIGHT VENTRICLE RV Basal diam:  2.10 cm LEFT ATRIUM           Index LA diam:      4.70 cm 2.41 cm/m LA Vol (A2C): 58.4 ml 29.92 ml/m LA Vol (A4C): 69.2 ml 35.45 ml/m  AORTIC VALVE LVOT Vmax:   158.00 cm/s LVOT Vmean:  115.000 cm/s LVOT VTI:    3.590 m MV E velocity: 123.00 cm/s 103 cm/s MV A velocity: 952.00 cm/s 70.3 cm/s SHUNTS MV E/A ratio:  0.13        1.5       Systemic VTI:  3.59 m                                      Systemic Diam: 2.00 cm  Ena Dawley MD Electronically signed by Ena Dawley MD Signature Date/Time:  09/22/2019/2:42:32 PM    Final     Assessment/Plan 1. Insomnia, unspecified type -ongoing issue, we tried to switch her off ambien, but to no avail, she's not sleeping and her goals are comfort-based at her request--palliative approach so will resume her Lorrin Mais so she can rest - zolpidem (AMBIEN) 5 MG tablet; Take 1 tablet (5 mg total) by mouth at bedtime.  Dispense: 30 tablet; Refill: 5  2. Acute blood loss anemia -noted that hgb dropped considerably over the past year when checked by cardiology into 8s range; now 7.4 -will recheck to confirm and be sure it has not dropped more; if stable at this level, arrange outpatient transfusion 2 units pRBCs--may need lasix b/w -I did not stop eliquis for fear she'll have a stroke which would dramatically affect her qol and independence which she's trying to maintain, and hopefully, she can be transfused tomorrow or friday -she was not excited about seeing GI for a workup, but said she'd take the next step of a visit with them once we got her hgb up so she would be less sob and able to rest better  3. Heme positive stool -noted on DRE today and pt's been noting dark stools -will begin iron to gradually increase hgb also (nu-iron 150mg  daily)  4. Paroxysmal atrial fibrillation (HCC) -cont eliquis for now unless hgb continues to trend down and then will need to hold -she is not using ibuprofen but on rare occasion, I'm told  5. Chronic diastolic (congestive) heart failure (HCC) -cont her current regimen, does not appear to be in acute volume overload -will plan to give a dose of lasix b/w units of blood due to history  6. Multiple lung nodules on CT -she has been evaluated by Dr. Melvyn Novas and not felt to be a good candidate for biopsy given location and pt does not want any aggressive treatments of lung cancer--she is very clear about this  7. Thoracic aortic aneurysm without rupture (HCC) -seems anemia is GI in etiology with positive  DRE -monitor  Labs/tests ordered:  Repeat cbc with diff today Next appt: 10/06/19--f/u  in clinic in one week  Antionio Negron L. Paw Karstens, D.O. [facility] [address], [place] [address] Cell Phone (Mon-Fri 8am-5pm):  [phone] On Call:  [phone] & follow prompts after 5pm & weekends Office Phone:  [phone] Office Fax:  [phone]

## 2019-09-30 ENCOUNTER — Encounter: Payer: Self-pay | Admitting: *Deleted

## 2019-09-30 ENCOUNTER — Telehealth: Payer: Self-pay | Admitting: Internal Medicine

## 2019-09-30 NOTE — Telephone Encounter (Signed)
I saw Mrs. Stroebel in clinic on Wednesday at [facility].  She was quite pale and dyspneic, cold all the time and labs showed a drop in her hgb back in November when she saw cardiology.  hgb had dropped even further on her recent labs to 7.4.  Recheck yesterday was 7.5.  DRE was positive for blood at her appt.  We had discussed arranged for a transfusion of 2 units PRBCs at the infusion center if the value was accurate due to her symptomatic anemia.  She does not want an aggressive workup and intervention.  Goals are comfort-based.  Stephaniemarie Stoffel L. Vitaly Wanat, D.O. [facility] [address], [place] [address] Cell Phone (Mon-Fri 8am-5pm):  [phone] On Call:  [phone] & follow prompts after 5pm & weekends Office Phone:  [phone] Office Fax:  [phone]

## 2019-10-03 ENCOUNTER — Encounter (HOSPITAL_COMMUNITY): Payer: Self-pay | Admitting: Emergency Medicine

## 2019-10-03 ENCOUNTER — Other Ambulatory Visit: Payer: Self-pay

## 2019-10-03 ENCOUNTER — Inpatient Hospital Stay (HOSPITAL_COMMUNITY): Payer: Medicare Other

## 2019-10-03 ENCOUNTER — Emergency Department (HOSPITAL_COMMUNITY): Payer: Medicare Other

## 2019-10-03 ENCOUNTER — Inpatient Hospital Stay (HOSPITAL_COMMUNITY)
Admission: EM | Admit: 2019-10-03 | Discharge: 2019-10-08 | DRG: 813 | Disposition: A | Discharge status: Skilled Nursing Facility | Payer: Medicare Other | Attending: Family Medicine | Admitting: Family Medicine

## 2019-10-03 DIAGNOSIS — R195 Other fecal abnormalities: Secondary | ICD-10-CM | POA: Diagnosis not present

## 2019-10-03 DIAGNOSIS — Z7901 Long term (current) use of anticoagulants: Secondary | ICD-10-CM | POA: Diagnosis not present

## 2019-10-03 DIAGNOSIS — I5032 Chronic diastolic (congestive) heart failure: Secondary | ICD-10-CM | POA: Diagnosis present

## 2019-10-03 DIAGNOSIS — F329 Major depressive disorder, single episode, unspecified: Secondary | ICD-10-CM | POA: Diagnosis not present

## 2019-10-03 DIAGNOSIS — F1020 Alcohol dependence, uncomplicated: Secondary | ICD-10-CM | POA: Diagnosis not present

## 2019-10-03 DIAGNOSIS — Z8249 Family history of ischemic heart disease and other diseases of the circulatory system: Secondary | ICD-10-CM

## 2019-10-03 DIAGNOSIS — F418 Other specified anxiety disorders: Secondary | ICD-10-CM | POA: Diagnosis present

## 2019-10-03 DIAGNOSIS — D6832 Hemorrhagic disorder due to extrinsic circulating anticoagulants: Principal | ICD-10-CM | POA: Diagnosis present

## 2019-10-03 DIAGNOSIS — D709 Neutropenia, unspecified: Secondary | ICD-10-CM | POA: Diagnosis not present

## 2019-10-03 DIAGNOSIS — Z20822 Contact with and (suspected) exposure to covid-19: Secondary | ICD-10-CM | POA: Diagnosis present

## 2019-10-03 DIAGNOSIS — K922 Gastrointestinal hemorrhage, unspecified: Secondary | ICD-10-CM | POA: Diagnosis not present

## 2019-10-03 DIAGNOSIS — K31819 Angiodysplasia of stomach and duodenum without bleeding: Secondary | ICD-10-CM | POA: Diagnosis present

## 2019-10-03 DIAGNOSIS — I480 Paroxysmal atrial fibrillation: Secondary | ICD-10-CM | POA: Diagnosis present

## 2019-10-03 DIAGNOSIS — I110 Hypertensive heart disease with heart failure: Secondary | ICD-10-CM | POA: Diagnosis present

## 2019-10-03 DIAGNOSIS — Z888 Allergy status to other drugs, medicaments and biological substances status: Secondary | ICD-10-CM

## 2019-10-03 DIAGNOSIS — Z743 Need for continuous supervision: Secondary | ICD-10-CM | POA: Diagnosis not present

## 2019-10-03 DIAGNOSIS — Z803 Family history of malignant neoplasm of breast: Secondary | ICD-10-CM | POA: Diagnosis not present

## 2019-10-03 DIAGNOSIS — I5033 Acute on chronic diastolic (congestive) heart failure: Secondary | ICD-10-CM | POA: Diagnosis not present

## 2019-10-03 DIAGNOSIS — D500 Iron deficiency anemia secondary to blood loss (chronic): Secondary | ICD-10-CM | POA: Diagnosis not present

## 2019-10-03 DIAGNOSIS — D649 Anemia, unspecified: Secondary | ICD-10-CM | POA: Diagnosis not present

## 2019-10-03 DIAGNOSIS — Z87891 Personal history of nicotine dependence: Secondary | ICD-10-CM | POA: Diagnosis not present

## 2019-10-03 DIAGNOSIS — G4700 Insomnia, unspecified: Secondary | ICD-10-CM | POA: Diagnosis present

## 2019-10-03 DIAGNOSIS — I10 Essential (primary) hypertension: Secondary | ICD-10-CM | POA: Diagnosis not present

## 2019-10-03 DIAGNOSIS — R0602 Shortness of breath: Secondary | ICD-10-CM | POA: Diagnosis not present

## 2019-10-03 DIAGNOSIS — I712 Thoracic aortic aneurysm, without rupture: Secondary | ICD-10-CM | POA: Diagnosis present

## 2019-10-03 DIAGNOSIS — Z66 Do not resuscitate: Secondary | ICD-10-CM | POA: Diagnosis present

## 2019-10-03 DIAGNOSIS — Z86010 Personal history of colonic polyps: Secondary | ICD-10-CM

## 2019-10-03 DIAGNOSIS — R5383 Other fatigue: Secondary | ICD-10-CM | POA: Diagnosis not present

## 2019-10-03 DIAGNOSIS — Z79899 Other long term (current) drug therapy: Secondary | ICD-10-CM

## 2019-10-03 DIAGNOSIS — E669 Obesity, unspecified: Secondary | ICD-10-CM | POA: Diagnosis present

## 2019-10-03 DIAGNOSIS — R791 Abnormal coagulation profile: Secondary | ICD-10-CM

## 2019-10-03 DIAGNOSIS — Z8711 Personal history of peptic ulcer disease: Secondary | ICD-10-CM | POA: Diagnosis not present

## 2019-10-03 DIAGNOSIS — R1013 Epigastric pain: Secondary | ICD-10-CM | POA: Diagnosis not present

## 2019-10-03 DIAGNOSIS — R000 Tachycardia, unspecified: Secondary | ICD-10-CM | POA: Diagnosis not present

## 2019-10-03 DIAGNOSIS — R918 Other nonspecific abnormal finding of lung field: Secondary | ICD-10-CM | POA: Diagnosis present

## 2019-10-03 DIAGNOSIS — H269 Unspecified cataract: Secondary | ICD-10-CM | POA: Diagnosis present

## 2019-10-03 DIAGNOSIS — Z6832 Body mass index (BMI) 32.0-32.9, adult: Secondary | ICD-10-CM | POA: Diagnosis not present

## 2019-10-03 DIAGNOSIS — R58 Hemorrhage, not elsewhere classified: Secondary | ICD-10-CM | POA: Diagnosis not present

## 2019-10-03 DIAGNOSIS — R279 Unspecified lack of coordination: Secondary | ICD-10-CM | POA: Diagnosis not present

## 2019-10-03 DIAGNOSIS — K209 Esophagitis, unspecified without bleeding: Secondary | ICD-10-CM | POA: Diagnosis not present

## 2019-10-03 DIAGNOSIS — D61818 Other pancytopenia: Secondary | ICD-10-CM | POA: Diagnosis present

## 2019-10-03 DIAGNOSIS — T45515A Adverse effect of anticoagulants, initial encounter: Secondary | ICD-10-CM | POA: Diagnosis present

## 2019-10-03 DIAGNOSIS — R0902 Hypoxemia: Secondary | ICD-10-CM | POA: Diagnosis not present

## 2019-10-03 DIAGNOSIS — K3189 Other diseases of stomach and duodenum: Secondary | ICD-10-CM | POA: Diagnosis not present

## 2019-10-03 DIAGNOSIS — I959 Hypotension, unspecified: Secondary | ICD-10-CM | POA: Diagnosis not present

## 2019-10-03 DIAGNOSIS — M109 Gout, unspecified: Secondary | ICD-10-CM | POA: Diagnosis present

## 2019-10-03 DIAGNOSIS — K2970 Gastritis, unspecified, without bleeding: Secondary | ICD-10-CM | POA: Diagnosis not present

## 2019-10-03 DIAGNOSIS — K2100 Gastro-esophageal reflux disease with esophagitis, without bleeding: Secondary | ICD-10-CM | POA: Diagnosis present

## 2019-10-03 LAB — I-STAT CHEM 8, ED
BUN: 36 mg/dL — ABNORMAL HIGH (ref 8–23)
Calcium, Ion: 0.99 mmol/L — ABNORMAL LOW (ref 1.15–1.40)
Chloride: 96 mmol/L — ABNORMAL LOW (ref 98–111)
Creatinine, Ser: 1.1 mg/dL — ABNORMAL HIGH (ref 0.44–1.00)
Glucose, Bld: 154 mg/dL — ABNORMAL HIGH (ref 70–99)
HCT: 16 % — ABNORMAL LOW (ref 36.0–46.0)
Hemoglobin: 5.4 g/dL — CL (ref 12.0–15.0)
Potassium: 3.9 mmol/L (ref 3.5–5.1)
Sodium: 136 mmol/L (ref 135–145)
TCO2: 31 mmol/L (ref 22–32)

## 2019-10-03 LAB — PROTIME-INR
INR: 2.2 — ABNORMAL HIGH (ref 0.8–1.2)
Prothrombin Time: 24.2 seconds — ABNORMAL HIGH (ref 11.4–15.2)

## 2019-10-03 LAB — CBC WITH DIFFERENTIAL/PLATELET
Abs Immature Granulocytes: 0 10*3/uL (ref 0.00–0.07)
Basophils Absolute: 0 10*3/uL (ref 0.0–0.1)
Basophils Relative: 0 %
Eosinophils Absolute: 0 10*3/uL (ref 0.0–0.5)
Eosinophils Relative: 0 %
HCT: 16.4 % — ABNORMAL LOW (ref 36.0–46.0)
Hemoglobin: 4.9 g/dL — CL (ref 12.0–15.0)
Lymphocytes Relative: 31 %
Lymphs Abs: 0.8 10*3/uL (ref 0.7–4.0)
MCH: 29.7 pg (ref 26.0–34.0)
MCHC: 29.9 g/dL — ABNORMAL LOW (ref 30.0–36.0)
MCV: 99.4 fL (ref 80.0–100.0)
Monocytes Absolute: 0.1 10*3/uL (ref 0.1–1.0)
Monocytes Relative: 3 %
Neutro Abs: 1.8 10*3/uL (ref 1.7–7.7)
Neutrophils Relative %: 66 %
Platelets: 159 10*3/uL (ref 150–400)
RBC: 1.65 MIL/uL — ABNORMAL LOW (ref 3.87–5.11)
RDW: 20.9 % — ABNORMAL HIGH (ref 11.5–15.5)
WBC: 2.7 10*3/uL — ABNORMAL LOW (ref 4.0–10.5)
nRBC: 14 /100 WBC — ABNORMAL HIGH
nRBC: 9.5 % — ABNORMAL HIGH (ref 0.0–0.2)

## 2019-10-03 LAB — COMPREHENSIVE METABOLIC PANEL
ALT: 11 U/L (ref 0–44)
AST: 14 U/L — ABNORMAL LOW (ref 15–41)
Albumin: 3.1 g/dL — ABNORMAL LOW (ref 3.5–5.0)
Alkaline Phosphatase: 58 U/L (ref 38–126)
Anion gap: 13 (ref 5–15)
BUN: 35 mg/dL — ABNORMAL HIGH (ref 8–23)
CO2: 27 mmol/L (ref 22–32)
Calcium: 8.4 mg/dL — ABNORMAL LOW (ref 8.9–10.3)
Chloride: 97 mmol/L — ABNORMAL LOW (ref 98–111)
Creatinine, Ser: 1.07 mg/dL — ABNORMAL HIGH (ref 0.44–1.00)
GFR calc Af Amer: 55 mL/min — ABNORMAL LOW (ref >60)
GFR calc non Af Amer: 48 mL/min — ABNORMAL LOW (ref >60)
Glucose, Bld: 160 mg/dL — ABNORMAL HIGH (ref 70–99)
Potassium: 3.8 mmol/L (ref 3.5–5.1)
Sodium: 137 mmol/L (ref 135–145)
Total Bilirubin: 1 mg/dL (ref 0.3–1.2)
Total Protein: 5.4 g/dL — ABNORMAL LOW (ref 6.5–8.1)

## 2019-10-03 LAB — LIPASE, BLOOD: Lipase: 19 U/L (ref 11–51)

## 2019-10-03 LAB — PREPARE RBC (CROSSMATCH)

## 2019-10-03 LAB — RESPIRATORY PANEL BY RT PCR (FLU A&B, COVID)
Influenza A by PCR: NEGATIVE
Influenza B by PCR: NEGATIVE
SARS Coronavirus 2 by RT PCR: NEGATIVE

## 2019-10-03 LAB — ABO/RH: ABO/RH(D): O NEG

## 2019-10-03 MED ORDER — SODIUM CHLORIDE 0.9 % IV SOLN
80.0000 mg | Freq: Once | INTRAVENOUS | Status: AC
  Administered 2019-10-03: 19:00:00 80 mg via INTRAVENOUS
  Filled 2019-10-03: qty 80

## 2019-10-03 MED ORDER — SODIUM CHLORIDE 0.9% IV SOLUTION: Freq: Once | INTRAVENOUS | Status: AC

## 2019-10-03 MED ORDER — SODIUM CHLORIDE 0.9 % IV SOLN
8.0000 mg/h | INTRAVENOUS | Status: AC
  Administered 2019-10-04 – 2019-10-06 (×5): 8 mg/h via INTRAVENOUS
  Filled 2019-10-03 (×12): qty 80

## 2019-10-03 MED ORDER — SODIUM CHLORIDE 0.9 % IV SOLN: INTRAVENOUS | Status: DC

## 2019-10-03 MED ORDER — ACETAMINOPHEN 325 MG PO TABS
650.0000 mg | ORAL_TABLET | Freq: Four times a day (QID) | ORAL | Status: DC | PRN
  Administered 2019-10-04 – 2019-10-08 (×10): 650 mg via ORAL
  Filled 2019-10-03 (×10): qty 2

## 2019-10-03 MED ORDER — DOCUSATE SODIUM 100 MG PO CAPS
100.0000 mg | ORAL_CAPSULE | Freq: Every day | ORAL | Status: DC
  Administered 2019-10-04 – 2019-10-08 (×3): 100 mg via ORAL
  Filled 2019-10-03 (×3): qty 1

## 2019-10-03 MED ORDER — ACETAMINOPHEN 650 MG RE SUPP: 650.0000 mg | Freq: Four times a day (QID) | RECTAL | Status: DC | PRN

## 2019-10-03 MED ORDER — ALLOPURINOL 100 MG PO TABS
100.0000 mg | ORAL_TABLET | Freq: Every day | ORAL | Status: DC
  Administered 2019-10-04 – 2019-10-08 (×5): 100 mg via ORAL
  Filled 2019-10-03 (×5): qty 1

## 2019-10-03 MED ORDER — ZOLPIDEM TARTRATE 5 MG PO TABS
5.0000 mg | ORAL_TABLET | Freq: Every day | ORAL | Status: DC
  Administered 2019-10-03 – 2019-10-07 (×5): 5 mg via ORAL
  Filled 2019-10-03 (×5): qty 1

## 2019-10-03 MED ORDER — NEBIVOLOL HCL 10 MG PO TABS
10.0000 mg | ORAL_TABLET | Freq: Every day | ORAL | Status: DC
  Administered 2019-10-04 – 2019-10-08 (×5): 10 mg via ORAL
  Filled 2019-10-03 (×5): qty 1

## 2019-10-03 MED ORDER — FLECAINIDE ACETATE 50 MG PO TABS
50.0000 mg | ORAL_TABLET | Freq: Two times a day (BID) | ORAL | Status: DC
  Administered 2019-10-03 – 2019-10-08 (×10): 50 mg via ORAL
  Filled 2019-10-03 (×10): qty 1

## 2019-10-03 MED ORDER — PANTOPRAZOLE SODIUM 40 MG IV SOLR: 40.0000 mg | Freq: Two times a day (BID) | INTRAVENOUS | Status: DC

## 2019-10-03 MED ORDER — MELATONIN 3 MG PO TABS
4.5000 mg | ORAL_TABLET | Freq: Every day | ORAL | Status: DC
  Administered 2019-10-03 – 2019-10-07 (×5): 4.5 mg via ORAL
  Filled 2019-10-03 (×6): qty 1.5

## 2019-10-03 MED ORDER — LORAZEPAM 2 MG/ML IJ SOLN
0.2500 mg | Freq: Once | INTRAMUSCULAR | Status: AC
  Administered 2019-10-03: 0.25 mg via INTRAVENOUS
  Filled 2019-10-03: qty 1

## 2019-10-03 NOTE — ED Triage Notes (Signed)
Pt BIB [facility] from home. Per EMS pt diagnosed with a GI bleed on Friday. Pt was impacted until 10 am today. Pt has since had multiple dark tarry stools, upper abdominal pain and weakness. Pain 4/10 upon arrival. VSS. NAD.

## 2019-10-03 NOTE — ED Notes (Signed)
Second unit of blood Began at 2306.

## 2019-10-03 NOTE — ED Notes (Signed)
Pt refused to wear BP cuff. RN explained the risk and benefits for wearing the cuff and why it was needed to monitor vital signs while having a blood transfusion. Pt still refused to wear the BP Cuff.

## 2019-10-03 NOTE — ED Provider Notes (Signed)
[facility] EMERGENCY DEPARTMENT Provider Note   CSN: [id number] Arrival date & time: 10/03/19  1732     History Chief Complaint  Patient presents with  . GI Bleeding    [name] is a 86 y.o. female.  HPI      Not been feeling well for a few weeks, shortness of breath, fatigue, giving out with exerting self with walker, feeling very weak and has been progressive. Called nurse from [place] today, said impacted and taking colace without help--nurse released the impaction and then she had several nonformed but not liquid, black stool, at one sitting had 3.  Too worn out to look at second but believes it was more liquid  Was having dark stool prior, hx of GI bleed, had been attributing it to diet (kale, spinach etc)-has been 2-3 weeks, (darker stool not black but today was black),  No blood clots or bright red blood.    On eliquis  Lightheaded. No syncope. No falls. Nausea began this afternoon after taking iron. Took iron and shortly after taking it had epigastric pain. Having epigastric pain now 4/10. Burning pain. Newly diagnosed CHF.   Headache.  Chest pressure.   "Misery factor" is a 10/10    Hx of bleeding ulcers,  did go to [place] for GI prior to age of 76. Hx of transfusion for bleeding in remote past.  DNR Past Medical History:  Diagnosis Date  . A-fib ([place])   . Abdominal pain, unspecified site   . Abnormality of gait 06/02/2013  . Arthritis   . Cataracts, bilateral   . Depression   . Diverticulosis of colon (without mention of hemorrhage)   . Diverticulosis of colon (without mention of hemorrhage) 10/27/2012  . Edema 10/27/2012  . GI bleed 06/2002  . Hemorrhage of gastrointestinal tract, unspecified   . History of stomach ulcers   . Hypertension   . Insomnia, unspecified 10/27/2012  . Localized osteoarthrosis not specified whether primary or secondary, lower leg 05/31/2011  . Migraines   . Nausea alone 10/28/2012  . Obesity, unspecified  10/27/2012  . Osteoarthrosis, unspecified whether generalized or localized, lower leg   . Other acariasis    peripherial neuropathy  . Other and unspecified alcohol dependence, continuous drinking behavior 10/28/2012  . Palpitations   . PVC (premature ventricular contraction)   . Unspecified hereditary and idiopathic peripheral neuropathy   . UTI (lower urinary tract infection)    pt state uti w/ no pain    Patient Active Problem List   Diagnosis Date Noted  . GI bleeding 10/03/2019  . Abnormal CT of the chest 04/19/2019  . Chronic rhinitis 11/30/2018  . Aortic atherosclerosis ([place]) 08/26/2018  . Thoracic aortic aneurysm without rupture ([place]) 08/26/2018  . Viral pneumonia 08/08/2018  . Chronic diastolic heart failure ([place]) 12/10/2017  . Urinary incontinence, mixed 08/18/2017  . Hypercoagulable state due to atrial fibrillation ([place]) 08/06/2017  . Major depressive disorder with single episode, in full remission ([place]) 08/06/2017  . Gastroesophageal reflux disease 04/29/2017  . Diarrhea of presumed infectious origin 04/29/2017  . Lumbar paraspinal muscle spasm 05/20/2016  . Pruritus 11/08/2013  . Hx of peptic ulcer 06/13/2013  . Hyperglycemia 06/07/2013  . DJD (degenerative joint disease) 06/07/2013  . Hypokalemia 06/06/2013  . Abnormality of gait 06/02/2013  . Unintentional weight loss 03/29/2013  . Cough, persistent 03/29/2013  . Tremor 01/11/2013  . Nausea 01/11/2013  . Depression   . Alcohol dependence, continuous ([place]) 10/28/2012  . Insomnia 10/27/2012  .  Paroxysmal atrial fibrillation (H[facility])   . PVC (premature ventricular contraction)   . Essential hypertension 10/08/2012    Past Surgical History:  Procedure Laterality Date  . ARTHROSCOPIC REPAIR ACL    . BREAST BIOPSY  1973   left  . BREAST REDUCTION SURGERY  12/23/2003  . cataracts    . CHOLECYSTECTOMY  2005  . TONSILLECTOMY AND ADENOIDECTOMY  1941     OB History    Gravida  0   Para  0   Term  0   Preterm    0   AB  0   Living  0     SAB  0   TAB  0   Ectopic  0   Multiple  0   Live Births  0           Family History  Problem Relation Age of Onset  . Breast cancer Mother   . Hypertension Mother   . Hypertension Father   . Allergic rhinitis Neg Hx   . Asthma Neg Hx     Social History   Tobacco Use  . Smoking status: Former Smoker    Packs/day: 1.00    Years: 30.00    Pack years: 30.00    Types: Cigarettes    Quit date: 10/09/1983    Years since quitting: 36.0  . Smokeless tobacco: Never Used  Substance Use Topics  . Alcohol use: Yes    Alcohol/week: 1.0 - 2.0 standard drinks    Types: 1 - 2 Shots of liquor per week    Comment: 2 DRINKS A DAY - daily  scotch   . Drug use: No    Home Medications Prior to Admission medications   Medication Sig Start Date End Date Taking? Authorizing Provider  acetaminophen (TYLENOL) 500 MG tablet Take 750-1,000 mg by mouth See admin instructions. Take 2 tablets (1000 mg) by mouth every morning and 1 1/2 tablets (750 mg) at night   Yes [provider]  allopurinol (ZYLOPRIM) 100 MG tablet Take 1 tablet (100 mg total) by mouth daily. 09/27/19  Yes Reed, Tiffany L, DO  apixaban (ELIQUIS) 5 MG TABS tablet Take 1 tablet (5 mg total) by mouth 2 (two) times daily. 06/29/19  Yes Reed, Tiffany L, DO  Ascorbic Acid (VITAMIN C) 100 MG tablet Take 100 mg by mouth daily.   Yes [provider]  Cholecalciferol (VITAMIN D3) 250 MCG (10000 UT) capsule Take 10,000 Units by mouth daily.   Yes [provider]  docusate sodium (COLACE) 100 MG capsule Take 1 capsule (100 mg total) by mouth daily. 08/27/19  Yes Ngetich, Dinah C, NP  flecainide (TAMBOCOR) 50 MG tablet TAKE 1 TABLET BY MOUTH TWICE DAILY FOR ATRIAL FIBRILLATION Patient taking differently: Take 50 mg by mouth 2 (two) times daily. For atrial fibrillation 06/29/19  Yes Reed, Tiffany L, DO  furosemide (LASIX) 20 MG tablet Take 3 tablets (60 mg total) by mouth daily.  09/22/19  Yes Kroeger, Daleen Snook M., PA-C  irbesartan (AVAPRO) 75 MG tablet Take 1 tablet (75 mg total) by mouth daily. 09/27/19  Yes Reed, Tiffany L, DO  Melatonin 5 MG TABS Take 5 mg by mouth at bedtime.   Yes [provider]  nebivolol (BYSTOLIC) 10 MG tablet Take 1 tablet (10 mg total) by mouth daily. 06/29/19  Yes Reed, Tiffany L, DO  ondansetron (ZOFRAN) 4 MG tablet Take 1 tablet (4 mg total) by mouth 2 (two) times daily as needed for nausea. 08/26/18  Yes Reed, Tiffany L, DO  Phenylephrine HCl (NEO-SYNEPHRINE NA) Place 1 spray into the nose 2 (two) times daily as needed (congestion).   Yes [provider]  potassium chloride SA (KLOR-CON) 20 MEQ tablet Take 1 tablet for 2 times a day for 3 days when taking Lasix 2 times a day for 3 days. Take 1 tablet daily on days Lasix is taken daily. Patient taking differently: Take 10 mEq by mouth 2 (two) times daily.  09/28/19  Yes Kroeger, Daleen Snook M., PA-C  zolpidem (AMBIEN) 5 MG tablet Take 1 tablet (5 mg total) by mouth at bedtime. 09/29/19  Yes Reed, [place], DO  [facility] Name: check weight three times per week on Monday,Wednesday and Friday.Notify provider for abrupt weight gain > 3 lbs from her baseline 198 lbs. 08/27/19   Ngetich, Dinah C, NP    Allergies    Mirtazapine, Nickel, Other, and Prednisone  Review of Systems   Review of Systems  Constitutional: Positive for appetite change and fatigue. Negative for fever.  HENT: Negative for sore throat.   Eyes: Negative for visual disturbance.  Respiratory: Positive for shortness of breath. Negative for cough.   Cardiovascular: Positive for chest pain.  Gastrointestinal: Positive for abdominal pain, blood in stool (black stool) and nausea. Negative for diarrhea (was constipated had taken colace, been disimpacted then had loose black stool) and vomiting.  Genitourinary: Negative for difficulty urinating.  Musculoskeletal: Negative for back pain and neck pain.  Skin: Negative for  rash.  Neurological: Positive for headaches. Negative for syncope.    Physical Exam Updated Vital Signs BP (!) 159/55   Pulse 77   Temp 98.4 F (36.9 C) (Oral)   Resp 20   LMP  (LMP Unknown)   SpO2 95%   Physical Exam Vitals and nursing note reviewed.  Constitutional:      General: She is not in acute distress.    Appearance: She is well-developed. She is ill-appearing. She is not diaphoretic.     Comments: Pale   HENT:     Head: Normocephalic and atraumatic.  Eyes:     Conjunctiva/sclera: Conjunctivae normal.  Cardiovascular:     Rate and Rhythm: Normal rate and regular rhythm.     Heart sounds: Normal heart sounds. No murmur. No friction rub. No gallop.   Pulmonary:     Effort: Pulmonary effort is normal. No respiratory distress.     Breath sounds: Normal breath sounds. No wheezing or rales.  Abdominal:     General: There is no distension.     Palpations: Abdomen is soft.     Tenderness: There is no abdominal tenderness. There is no guarding.  Musculoskeletal:        General: No tenderness.     Cervical back: Normal range of motion.  Skin:    General: Skin is warm and dry.     Findings: No erythema or rash.  Neurological:     Mental Status: She is alert and oriented to person, place, and time.     ED Results / Procedures / Treatments   Labs (all labs ordered are listed, but only abnormal results are displayed) Labs Reviewed  CBC WITH DIFFERENTIAL/PLATELET - Abnormal; Notable for the following components:      Result Value   WBC 2.7 (*)    RBC 1.65 (*)    Hemoglobin 4.9 (*)    HCT 16.4 (*)    MCHC 29.9 (*)    RDW 20.9 (*)  nRBC 9.5 (*)    nRBC 14 (*)    All other components within normal limits  COMPREHENSIVE METABOLIC PANEL - Abnormal; Notable for the following components:   Chloride 97 (*)    Glucose, Bld 160 (*)    BUN 35 (*)    Creatinine, Ser 1.07 (*)    Calcium 8.4 (*)    Total Protein 5.4 (*)    Albumin 3.1 (*)    AST 14 (*)    GFR calc  non Af Amer 48 (*)    GFR calc Af Amer 55 (*)    All other components within normal limits  PROTIME-INR - Abnormal; Notable for the following components:   Prothrombin Time 24.2 (*)    INR 2.2 (*)    All other components within normal limits  I-STAT CHEM 8, ED - Abnormal; Notable for the following components:   Chloride 96 (*)    BUN 36 (*)    Creatinine, Ser 1.10 (*)    Glucose, Bld 154 (*)    Calcium, Ion 0.99 (*)    Hemoglobin 5.4 (*)    HCT 16.0 (*)    All other components within normal limits  RESPIRATORY PANEL BY RT PCR (FLU A&B, COVID)  LIPASE, BLOOD  PATHOLOGIST SMEAR REVIEW  COMPREHENSIVE METABOLIC PANEL  CBC  PROTIME-INR  CBC  TYPE AND SCREEN  PREPARE RBC (CROSSMATCH)  ABO/RH  PREPARE FRESH FROZEN PLASMA    EKG EKG Interpretation  Date/Time:  Sunday October 03 2019 17:46:39 EST Ventricular Rate:  79 PR Interval:    QRS Duration: 149 QT Interval:  384 QTC Calculation: 441 R Axis:   -54 Text Interpretation: Sinus or ectopic atrial rhythm Left bundle branch block No significant change since last tracing Confirmed by Gareth Morgan [phone]) on 10/03/2019 7:36:39 PM   Radiology CT ABDOMEN PELVIS WO CONTRAST  Result Date: 10/03/2019 CLINICAL DATA:  86 year old female with dark tarry stools suggesting GI bleed. Epigastric pain. EXAM: CT ABDOMEN AND PELVIS WITHOUT CONTRAST TECHNIQUE: Multidetector CT imaging of the abdomen and pelvis was performed following the standard protocol without IV contrast. COMPARISON:  CT chest 04/08/2019. CT Abdomen and Pelvis 05/09/2016 and earlier. FINDINGS: Lower chest: Stable cardiomegaly since 2017. No pericardial or pleural effusion. Mild chronic lung base scarring is stable. Hepatobiliary: Surgically absent gallbladder. Negative noncontrast liver. Pancreas: Chronic fatty atrophy of the pancreas with no mass or inflammation identified. Spleen: Borderline to mild splenomegaly is stable since 2017. Adrenals/Urinary Tract: Normal adrenal  glands. Negative noncontrast kidneys. Negative ureters. Unremarkable urinary bladder. No urinary calculus. Stomach/Bowel: The rectum appears to remain normal despite increased presacral stranding since 2017. Redundant sigmoid colon with mild diverticulosis and no active inflammation. Similar appearance of the descending colon. There is some fluid at the splenic flexure and in the transverse colon. Fluid-filled but otherwise negative right colon. Normal appendix on series 3, image 67 tracks to the right pelvic side wall. Decompressed and negative terminal ileum. No dilated small bowel. Negative noncontrast CT appearance of the stomach. There is a 3 centimeter duodenal diverticulum at the junction of the 2nd and 3rd portions (series 6, image 37), but otherwise negative duodenum. No mesenteric stranding identified. Vascular/Lymphatic: Aortoiliac calcified atherosclerosis. Vascular patency is not evaluated in the absence of IV contrast. Reproductive: Negative noncontrast appearance. Other: There is presacral stranding (series 3, image 70) which has mildly increased from 2017. No pelvic free fluid. Musculoskeletal: No acute osseous abnormality identified. IMPRESSION: 1. No acute or inflammatory process identified in the noncontrast Abdomen. A  3 cm diverticulum of the duodenum is chronic and these are generally asymptomatic. 2. No acute finding in the noncontrast Pelvis; nonspecific presacral stranding has mildly increased since 2017 but the nearby rectum remains within normal limits. 3. Aortic Atherosclerosis (ICD10-I70.0). Electronically Signed   By: Genevie Ann M.D.   On: 10/03/2019 22:22   DG Chest Portable 1 View  Result Date: 10/03/2019 CLINICAL DATA:  Fatigue, GI bleed EXAM: PORTABLE CHEST 1 VIEW COMPARISON:  08/08/2018 chest radiograph. FINDINGS: Stable cardiomediastinal silhouette with top-normal heart size and tortuous atherosclerotic thoracic aorta. No pneumothorax. No pleural effusion. Lungs appear clear, with  no acute consolidative airspace disease and no pulmonary edema. IMPRESSION: No active disease. Electronically Signed   By: Ilona Sorrel M.D.   On: 10/03/2019 18:19    Procedures .Critical Care Performed by: Gareth Morgan, MD Authorized by: Gareth Morgan, MD   Critical care provider statement:    Critical care time (minutes):  45   Critical care was time spent personally by me on the following activities:  Discussions with consultants, evaluation of patient's response to treatment, examination of patient, ordering and performing treatments and interventions, ordering and review of laboratory studies, ordering and review of radiographic studies, pulse oximetry, re-evaluation of patient's condition, obtaining history from patient or surrogate and review of old charts   (including critical care time)  Medications Ordered in ED Medications  pantoprazole (PROTONIX) 80 mg in sodium chloride 0.9 % 250 mL (0.32 mg/mL) infusion (has no administration in time range)  acetaminophen (TYLENOL) tablet 650 mg (has no administration in time range)    Or  acetaminophen (TYLENOL) suppository 650 mg (has no administration in time range)  allopurinol (ZYLOPRIM) tablet 100 mg (has no administration in time range)  flecainide (TAMBOCOR) tablet 50 mg (50 mg Oral Given 10/03/19 2228)  nebivolol (BYSTOLIC) tablet 10 mg (has no administration in time range)  zolpidem (AMBIEN) tablet 5 mg (5 mg Oral Given 10/03/19 2228)  docusate sodium (COLACE) capsule 100 mg (has no administration in time range)  Melatonin TABS 4.5 mg (4.5 mg Oral Given 10/03/19 2228)  pantoprazole (PROTONIX) 80 mg in sodium chloride 0.9 % 100 mL IVPB (0 mg Intravenous Stopped 10/03/19 2004)  0.9 %  sodium chloride infusion (Manually program via Guardrails IV Fluids) ( Intravenous New Bag/Given 10/03/19 1830)  0.9 %  sodium chloride infusion (Manually program via Guardrails IV Fluids) ( Intravenous New Bag/Given 10/03/19 2028)  LORazepam (ATIVAN)  injection 0.25 mg (0.25 mg Intravenous Given 10/03/19 2017)    ED Course  I have reviewed the triage vital signs and the nursing notes.  Pertinent labs & imaging results that were available during my care of the patient were reviewed by me and considered in my medical decision making (see chart for details).    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  Outpt hgb 7.5 a few days ago, trending down since 8.8 in November, previously 11.6.  Hemodynamically stable in ED, not currently have episodes of BM or bleeding.  Istat hgb 5.4, ordered pRBC x2Units and discussed with patient. Hgb then returned at 4.9.  INR 2.2, pt on  eliquis without hx of liver disfucntion. Given elevated INR, plan for multiple units of blood transfusion, ordered FFP.  Do not feel other reversal agents including [place] indicated at this time given pt hemodynamically stable and by hx has likely had bleeding for months. Given protonix bolus and gtt.  Abdominal exam benign, doubt acute surgical pathology.  Suspect symptoms secondary to PUD with GI bleed and significant anemia.    Discussed with Dr. Lyndel Safe of [facility] who will plan on EGD tomorrow unless hemodynamics change overnight. Dr. Maudie Mercury to admit.   Final Clinical Impression(s) / ED Diagnoses Final diagnoses:  Upper GI bleed  Symptomatic anemia  Elevated INR    Rx / DC Orders ED Discharge Orders    None       Gareth Morgan, MD 10/04/19 [phone]

## 2019-10-03 NOTE — H&P (Signed)
TRH H&P    Patient Demographics:    [name], is a 86 y.o. female  MRN: [id number]  DOB - 23-Mar-1933  Admit Date - 10/03/2019  Referring MD/NP/PA: Ralene Bathe  Outpatient Primary MD for the patient is Gayland Curry, DO  Patient coming from: [place]  Chief complaint- gi bleeding   HPI:    [name]  is a 86 y.o. female, w  Depression, migraines, hypertension, Pafib on Eliquis, hx of PUD, diverticulosis, hx of prior GI bleeding 2003, presents with being found anemic on 09/30/19 and set up for 2 units prbc? Its unclear if pt received or not.  Pt noted dyspnea and feeling wiped out.  Pt notes epigastric pain starting about 1 pm today after taking some iron.  Pt notes black stool for the past 2 weeks.  Pt denies hematemesis, n/v, diarrhea, brbpr.    Pt takes ibuprofen twice a day per patient for her arthritis.   Pt apparently sent for evaluation of suspected GI bleeding.    In ED,  T 98.1 P 78 R 20, Bp 114/56  Pox 93% on RA Wbc 2.7, hgb 4.9, Plt 159  Na 137, K 3.8, Bun 35, Creatinine 1.07 Ast 14, Alt 11, Alb 3.1 Lipase 19  Type and screen O negative  CXR IMPRESSION: No active disease.  ED spoke with Dr. Lyndel Safe [facility] ), and will consult on the patient,  Per ED, EGD in am  Pt given protonix 80mg  iv x 1, and 8mg / hr Pt given by ED, FFP and started on 2 units prbc  Pt will be admitted for GI bleeding,      Review of systems:    In addition to the HPI above,  No Fever-chills, No Headache, No changes with Vision or hearing, No problems swallowing food or Liquids, No Chest pain, Cough or Shortness of Breath,  No Nausea or Vomiting,  No Blood in Urine, No dysuria, No new skin rashes or bruises, No new joints pains-aches,  No new weakness, tingling, numbness in any extremity, No recent weight gain or loss, No polyuria, polydypsia or polyphagia, No significant Mental Stressors.  All  other systems reviewed and are negative.    Past History of the following :    Past Medical History:  Diagnosis Date  . A-fib ([place])   . Abdominal pain, unspecified site   . Abnormality of gait 06/02/2013  . Arthritis   . Cataracts, bilateral   . Depression   . Diverticulosis of colon (without mention of hemorrhage)   . Diverticulosis of colon (without mention of hemorrhage) 10/27/2012  . Edema 10/27/2012  . GI bleed 06/2002  . Hemorrhage of gastrointestinal tract, unspecified   . History of stomach ulcers   . Hypertension   . Insomnia, unspecified 10/27/2012  . Localized osteoarthrosis not specified whether primary or secondary, lower leg 05/31/2011  . Migraines   . Nausea alone 10/28/2012  . Obesity, unspecified 10/27/2012  . Osteoarthrosis, unspecified whether generalized or localized, lower leg   . Other acariasis    peripherial neuropathy  .  Other and unspecified alcohol dependence, continuous drinking behavior 10/28/2012  . Palpitations   . PVC (premature ventricular contraction)   . Unspecified hereditary and idiopathic peripheral neuropathy   . UTI (lower urinary tract infection)    pt state uti w/ no pain      Past Surgical History:  Procedure Laterality Date  . ARTHROSCOPIC REPAIR ACL    . BREAST BIOPSY  1973   left  . BREAST REDUCTION SURGERY  12/23/2003  . cataracts    . CHOLECYSTECTOMY  2005  . TONSILLECTOMY AND ADENOIDECTOMY  1941      Social History:      Social History   Tobacco Use  . Smoking status: Former Smoker    Packs/day: 1.00    Years: 30.00    Pack years: 30.00    Types: Cigarettes    Quit date: 10/09/1983    Years since quitting: 36.0  . Smokeless tobacco: Never Used  Substance Use Topics  . Alcohol use: Yes    Alcohol/week: 1.0 - 2.0 standard drinks    Types: 1 - 2 Shots of liquor per week    Comment: 2 DRINKS A DAY - daily  scotch        Family History :     Family History  Problem Relation Age of Onset  . Breast cancer Mother   .  Hypertension Mother   . Hypertension Father   . Allergic rhinitis Neg Hx   . Asthma Neg Hx        Home Medications:   Prior to Admission medications   Medication Sig Start Date End Date Taking? Authorizing Provider  acetaminophen (TYLENOL) 325 MG tablet Take 750-1,000 mg by mouth See admin instructions. Take 1000 mg in the morning and take 750 mg at night    [provider]  allopurinol (ZYLOPRIM) 100 MG tablet Take 1 tablet (100 mg total) by mouth daily. 09/27/19   Reed, Tiffany L, DO  apixaban (ELIQUIS) 5 MG TABS tablet Take 1 tablet (5 mg total) by mouth 2 (two) times daily. 06/29/19   Reed, Tiffany L, DO  Ascorbic Acid (VITAMIN C) 100 MG tablet Take 100 mg by mouth daily.    [provider]  Cholecalciferol (VITAMIN D3) 250 MCG (10000 UT) capsule Take 10,000 Units by mouth daily.    [provider]  docusate sodium (COLACE) 100 MG capsule Take 1 capsule (100 mg total) by mouth daily. 08/27/19   Ngetich, Dinah C, NP  flecainide (TAMBOCOR) 50 MG tablet TAKE 1 TABLET BY MOUTH TWICE DAILY FOR ATRIAL FIBRILLATION 06/29/19   Reed, Tiffany L, DO  furosemide (LASIX) 20 MG tablet Take 3 tablets (60 mg total) by mouth daily. 09/22/19   Kroeger, Lorelee Cover., PA-C  irbesartan (AVAPRO) 75 MG tablet Take 1 tablet (75 mg total) by mouth daily. 09/27/19   Reed, Tiffany L, DO  Melatonin 5 MG TABS Take 5 mg by mouth at bedtime.    [provider]  nebivolol (BYSTOLIC) 10 MG tablet Take 1 tablet (10 mg total) by mouth daily. 06/29/19   Reed, Tiffany L, DO  ondansetron (ZOFRAN) 4 MG tablet Take 1 tablet (4 mg total) by mouth 2 (two) times daily as needed for nausea. 08/26/18   Reed, Tiffany L, DO  Phenylephrine HCl (NEO-SYNEPHRINE NA) Place 1 spray into the nose 2 (two) times daily as needed (congestion).    [provider]  potassium chloride SA (KLOR-CON) 20 MEQ tablet Take 1 tablet for 2 times a day  for 3 days when taking Lasix 2 times a day for 3 days. Take 1 tablet  daily on days Lasix is taken daily. 09/28/19   Kroeger, Lorelee Cover., PA-C  UNABLE TO FIND Med Name: check weight three times per week on Monday,Wednesday and Friday.Notify provider for abrupt weight gain > 3 lbs from her baseline 198 lbs. 08/27/19   Ngetich, Dinah C, NP  zolpidem (AMBIEN) 5 MG tablet Take 1 tablet (5 mg total) by mouth at bedtime. 09/29/19   Reed, Tiffany L, DO     Allergies:     Allergies  Allergen Reactions  . Mirtazapine     tremor  . Nickel Other (See Comments)    inflammation  . Other Itching    Acrylic nails, and trace metals  . Prednisone     Causes sleep disturbances      Physical Exam:   Vitals  Blood pressure (!) 114/56, pulse 78, temperature 98.1 F (36.7 C), temperature source Oral, resp. rate 20, SpO2 93 %.  1.  General: axoxo3  2. Psychiatric: euthymic  3. Neurologic: nonfocal  4. HEENMT:  Anicteric, pale conjunctiva, pupils 1.70mm symmetric, direct, consensual intact Neck: no jvd  5. Respiratory : CTAB  6. Cardiovascular : rrr s1, s2, 2/6 sem rusb  7. Gastrointestinal:  Abd: soft, nt, nd, + bs  8. Skin:  Ext: no c/c/e, no rash  9.Musculoskeletal:  Good ROM    Data Review:    CBC Recent Labs  Lab 09/27/19 1053 09/29/19 0000 10/03/19 1756 10/03/19 1809  WBC 2.5* 3.0 2.7*  --   HGB 7.4* 7.5* 4.9* 5.4*  HCT 23.3* 23* 16.4* 16.0*  PLT 186 195 159  --   MCV 90.0  --  99.4  --   MCH 28.6  --  29.7  --   MCHC 31.8*  --  29.9*  --   RDW 16.6*  --  20.9*  --   LYMPHSABS 1,230  --  0.8  --   MONOABS  --   --  0.1  --   EOSABS 10*  --  0.0  --   BASOSABS 0  --  0.0  --    ------------------------------------------------------------------------------------------------------------------  Results for orders placed or performed during the hospital encounter of 10/03/19 (from the past 48 hour(s))  CBC with Differential     Status: Abnormal   Collection Time: 10/03/19  5:56 PM  Result Value Ref Range   WBC 2.7 (L) 4.0 - 10.5  K/uL   RBC 1.65 (L) 3.87 - 5.11 MIL/uL   Hemoglobin 4.9 (LL) 12.0 - 15.0 g/dL    Comment: REPEATED TO VERIFY THIS CRITICAL RESULT HAS VERIFIED AND BEEN CALLED TO J.BLUE RN BY KATHERINE MCCORMICK ON 01 10 2021 AT 1825, AND HAS BEEN READ BACK.     HCT 16.4 (L) 36.0 - 46.0 %   MCV 99.4 80.0 - 100.0 fL   MCH 29.7 26.0 - 34.0 pg   MCHC 29.9 (L) 30.0 - 36.0 g/dL   RDW 20.9 (H) 11.5 - 15.5 %   Platelets 159 150 - 400 K/uL    Comment: REPEATED TO VERIFY   nRBC 9.5 (H) 0.0 - 0.2 %   Neutrophils Relative % 66 %   Neutro Abs 1.8 1.7 - 7.7 K/uL   Lymphocytes Relative 31 %   Lymphs Abs 0.8 0.7 - 4.0 K/uL   Monocytes Relative 3 %   Monocytes Absolute 0.1 0.1 - 1.0 K/uL   Eosinophils Relative 0 %  Eosinophils Absolute 0.0 0.0 - 0.5 K/uL   Basophils Relative 0 %   Basophils Absolute 0.0 0.0 - 0.1 K/uL   WBC Morphology See Note     Comment: >10% reactive, Benign Lymphocytes.   nRBC 14 (H) 0 /100 WBC   Abs Immature Granulocytes 0.00 0.00 - 0.07 K/uL   Polychromasia PRESENT     Comment: Performed at [facility], [place] [address], [place], [place] [address]  Comprehensive metabolic panel     Status: Abnormal   Collection Time: 10/03/19  5:56 PM  Result Value Ref Range   Sodium 137 135 - 145 mmol/L   Potassium 3.8 3.5 - 5.1 mmol/L   Chloride 97 (L) 98 - 111 mmol/L   CO2 27 22 - 32 mmol/L   Glucose, Bld 160 (H) 70 - 99 mg/dL   BUN 35 (H) 8 - 23 mg/dL   Creatinine, Ser 1.07 (H) 0.44 - 1.00 mg/dL   Calcium 8.4 (L) 8.9 - 10.3 mg/dL   Total Protein 5.4 (L) 6.5 - 8.1 g/dL   Albumin 3.1 (L) 3.5 - 5.0 g/dL   AST 14 (L) 15 - 41 U/L   ALT 11 0 - 44 U/L   Alkaline Phosphatase 58 38 - 126 U/L   Total Bilirubin 1.0 0.3 - 1.2 mg/dL   GFR calc non Af Amer 48 (L) >60 mL/min   GFR calc Af Amer 55 (L) >60 mL/min   Anion gap 13 5 - 15    Comment: Performed at [facility], [place] [address] [place], [place] [address]  Protime-INR     Status: Abnormal   Collection Time: 10/03/19  5:56 PM    Result Value Ref Range   Prothrombin Time 24.2 (H) 11.4 - 15.2 seconds   INR 2.2 (H) 0.8 - 1.2    Comment: (NOTE) INR goal varies based on device and disease states. Performed at [facility], [place] [address], [place], [place] [address]   Type and screen [place]     Status: None (Preliminary result)   Collection Time: 10/03/19  5:56 PM  Result Value Ref Range   ABO/RH(D) O NEG    Antibody Screen NEG    Sample Expiration 10/06/2019,2359    Unit Number [id number]    Blood Component Type RED CELLS,LR    Unit division 00    Status of Unit ALLOCATED    Transfusion Status OK TO TRANSFUSE    Crossmatch Result      Compatible Performed at [facility], [place] [address], [place], [place] [address]    Unit Number [id number]    Blood Component Type RED CELLS,LR    Unit division 00    Status of Unit ALLOCATED    Transfusion Status OK TO TRANSFUSE    Crossmatch Result Compatible   Lipase, blood     Status: None   Collection Time: 10/03/19  5:56 PM  Result Value Ref Range   Lipase 19 11 - 51 U/L    Comment: Performed at [facility], [place] [address], [place], [place] [address]  ABO/Rh     Status: None (Preliminary result)   Collection Time: 10/03/19  5:56 PM  Result Value Ref Range   ABO/RH(D)      O NEG Performed at [facility] [address], [place],  [address]   I-stat chem 8, ED (not at [facility] or [facility])     Status: Abnormal   Collection  Time: 10/03/19  6:09 PM  Result Value Ref Range   Sodium 136 135 - 145 mmol/L   Potassium 3.9 3.5 - 5.1 mmol/L   Chloride 96 (L) 98 - 111 mmol/L   BUN 36 (H) 8 - 23 mg/dL   Creatinine, Ser 1.10 (H) 0.44 - 1.00 mg/dL   Glucose, Bld 154 (H) 70 - 99 mg/dL   Calcium, Ion 0.99 (L) 1.15 - 1.40 mmol/L   TCO2 31 22 - 32 mmol/L   Hemoglobin 5.4 (LL) 12.0 - 15.0 g/dL   HCT 16.0 (L) 36.0 - 46.0 %   Comment NOTIFIED PHYSICIAN   Prepare RBC     Status: None   Collection Time: 10/03/19  6:20 PM   Result Value Ref Range   Order Confirmation      ORDER PROCESSED BY BLOOD BANK Performed at [facility], [address], [place], [place] [address]   Prepare fresh frozen plasma     Status: None (Preliminary result)   Collection Time: 10/03/19  7:24 PM  Result Value Ref Range   Unit Number [id number]    Blood Component Type THAWED PLASMA    Unit division 00    Status of Unit ALLOCATED    Transfusion Status      OK TO TRANSFUSE Performed at [facility] [address] [place], [place] [address]     Chemistries  Recent Labs  Lab 09/27/19 1053 10/03/19 1756 10/03/19 1809  NA 138 137 136  K 3.2* 3.8 3.9  CL 94* 97* 96*  CO2 32 27  --   GLUCOSE 133* 160* 154*  BUN 14 35* 36*  CREATININE 0.90* 1.07* 1.10*  CALCIUM 9.0 8.4*  --   AST 10 14*  --   ALT 7 11  --   ALKPHOS  --  58  --   BILITOT 0.6 1.0  --    ------------------------------------------------------------------------------------------------------------------  ------------------------------------------------------------------------------------------------------------------ GFR: Estimated Creatinine Clearance: 40.4 mL/min (A) (by C-G formula based on SCr of 1.1 mg/dL (H)). Liver Function Tests: Recent Labs  Lab 09/27/19 1053 10/03/19 1756  AST 10 14*  ALT 7 11  ALKPHOS  --  58  BILITOT 0.6 1.0  PROT 6.3 5.4*  ALBUMIN  --  3.1*   Recent Labs  Lab 10/03/19 1756  LIPASE 19   No results for input(s): AMMONIA in the last 168 hours. Coagulation Profile: Recent Labs  Lab 10/03/19 1756  INR 2.2*   Cardiac Enzymes: No results for input(s): CKTOTAL, CKMB, CKMBINDEX, TROPONINI in the last 168 hours. BNP (last 3 results) No results for input(s): PROBNP in the last 8760 hours. HbA1C: No results for input(s): HGBA1C in the last 72 hours. CBG: No results for input(s): GLUCAP in the last 168 hours. Lipid Profile: No results for input(s): CHOL, HDL, LDLCALC, TRIG, CHOLHDL, LDLDIRECT in  the last 72 hours. Thyroid Function Tests: No results for input(s): TSH, T4TOTAL, FREET4, T3FREE, THYROIDAB in the last 72 hours. Anemia Panel: No results for input(s): VITAMINB12, FOLATE, FERRITIN, TIBC, IRON, RETICCTPCT in the last 72 hours.  --------------------------------------------------------------------------------------------------------------- Urine analysis:    Component Value Date/Time   COLORURINE YELLOW 08/08/2018 1159   APPEARANCEUR CLOUDY (A) 08/08/2018 1159   LABSPEC <1.005 (L) 08/08/2018 1159   PHURINE 6.0 08/08/2018 1159   GLUCOSEU NEGATIVE 08/08/2018 1159   HGBUR SMALL (A) 08/08/2018 1159   BILIRUBINUR NEGATIVE 08/08/2018 1159   BILIRUBINUR Neg 09/08/2017 1557   KETONESUR NEGATIVE 08/08/2018 1159   PROTEINUR NEGATIVE 08/08/2018 1159  UROBILINOGEN 0.2 09/08/2017 1557   UROBILINOGEN 0.2 12/02/2014 1242   NITRITE POSITIVE (A) 08/08/2018 1159   LEUKOCYTESUR TRACE (A) 08/08/2018 1159      Imaging Results:    DG Chest Portable 1 View  Result Date: 10/03/2019 CLINICAL DATA:  Fatigue, GI bleed EXAM: PORTABLE CHEST 1 VIEW COMPARISON:  08/08/2018 chest radiograph. FINDINGS: Stable cardiomediastinal silhouette with top-normal heart size and tortuous atherosclerotic thoracic aorta. No pneumothorax. No pleural effusion. Lungs appear clear, with no acute consolidative airspace disease and no pulmonary edema. IMPRESSION: No active disease. Electronically Signed   By: Ilona Sorrel M.D.   On: 10/03/2019 18:19       Assessment & Plan:    Principal Problem:   GI bleeding Active Problems:   Essential hypertension   Paroxysmal atrial fibrillation (HCC)   Hx of peptic ulcer   UGI bleeding Anemia NPO STOP Eliquis STOP Ibuprofen if taking protonix GTT Transfuse 2 units prbc, and check cbc after transfusion Probably will require further transfusion GI consulted by ED, appreciate input  Epigastric pain CT abd/ pelvis  Pafib STOP Eliquis due to above Cont  Flecainide  Cont Bystolic 10mg  po qday  Hypertension Hold Lasix, Avapro for now since bp on the low side due to GI Bleeding  Hx of Gout Cont Allopurinol 100mg  po qday  Insomnia Ambien 5mg  po qhs Melatonin   Hx of pulmonary nodule Outpatient follow up please   Thoracic Aortic Aneurysm Outpatient follow up please     DVT Prophylaxis-   SCDs   AM Labs Ordered, also please review Full Orders  Family Communication: Admission, patients condition and plan of care including tests being ordered have been discussed with the patient  who indicate understanding and agree with the plan and Code Status.  Code Status:  DNR, yellow ticket,  Notified Stepdaughter, Margretta Sidle that patient admitted to [facility]  Admission status: Inpatient: Based on patients clinical presentation and evaluation of above clinical data, I have made determination that patient meets Inpatient criteria at this time Pt has high risk of clinical deterioration.  Pt will require blood transfusion as well , pt will require >2 nites stay.   Time spent in minutes : 70 minutes   Jani Gravel M.D on 10/03/2019 at 7:56 PM

## 2019-10-03 NOTE — ED Notes (Signed)
Hgb. 4.9 

## 2019-10-04 LAB — COMPREHENSIVE METABOLIC PANEL
ALT: 10 U/L (ref 0–44)
AST: 11 U/L — ABNORMAL LOW (ref 15–41)
Albumin: 3.1 g/dL — ABNORMAL LOW (ref 3.5–5.0)
Alkaline Phosphatase: 55 U/L (ref 38–126)
Anion gap: 10 (ref 5–15)
BUN: 28 mg/dL — ABNORMAL HIGH (ref 8–23)
CO2: 29 mmol/L (ref 22–32)
Calcium: 8.5 mg/dL — ABNORMAL LOW (ref 8.9–10.3)
Chloride: 100 mmol/L (ref 98–111)
Creatinine, Ser: 1.08 mg/dL — ABNORMAL HIGH (ref 0.44–1.00)
GFR calc Af Amer: 55 mL/min — ABNORMAL LOW (ref >60)
GFR calc non Af Amer: 47 mL/min — ABNORMAL LOW (ref >60)
Glucose, Bld: 133 mg/dL — ABNORMAL HIGH (ref 70–99)
Potassium: 3.8 mmol/L (ref 3.5–5.1)
Sodium: 139 mmol/L (ref 135–145)
Total Bilirubin: 1 mg/dL (ref 0.3–1.2)
Total Protein: 5.2 g/dL — ABNORMAL LOW (ref 6.5–8.1)

## 2019-10-04 LAB — PROTIME-INR
INR: 1.6 — ABNORMAL HIGH (ref 0.8–1.2)
Prothrombin Time: 18.9 seconds — ABNORMAL HIGH (ref 11.4–15.2)

## 2019-10-04 LAB — CBC
HCT: 21.8 % — ABNORMAL LOW (ref 36.0–46.0)
Hemoglobin: 7.1 g/dL — ABNORMAL LOW (ref 12.0–15.0)
MCH: 30 pg (ref 26.0–34.0)
MCHC: 32.6 g/dL (ref 30.0–36.0)
MCV: 92 fL (ref 80.0–100.0)
Platelets: 134 10*3/uL — ABNORMAL LOW (ref 150–400)
RBC: 2.37 MIL/uL — ABNORMAL LOW (ref 3.87–5.11)
RDW: 18.6 % — ABNORMAL HIGH (ref 11.5–15.5)
WBC: 2.3 10*3/uL — ABNORMAL LOW (ref 4.0–10.5)
nRBC: 8.8 % — ABNORMAL HIGH (ref 0.0–0.2)

## 2019-10-04 LAB — PATHOLOGIST SMEAR REVIEW

## 2019-10-04 MED ORDER — DIPHENHYDRAMINE HCL 50 MG/ML IJ SOLN
25.0000 mg | Freq: Once | INTRAMUSCULAR | Status: AC
  Administered 2019-10-04: 03:00:00 25 mg via INTRAVENOUS
  Filled 2019-10-04: qty 1

## 2019-10-04 MED ORDER — DEXTROSE 5 % IV SOLN: INTRAVENOUS | Status: DC

## 2019-10-04 MED ORDER — DIPHENHYDRAMINE HCL 12.5 MG/5ML PO ELIX
12.5000 mg | ORAL_SOLUTION | Freq: Every day | ORAL | Status: DC | PRN
  Filled 2019-10-04: qty 5

## 2019-10-04 MED ORDER — FAMOTIDINE IN NACL 20-0.9 MG/50ML-% IV SOLN
20.0000 mg | Freq: Every day | INTRAVENOUS | Status: DC
  Administered 2019-10-04 – 2019-10-08 (×5): 20 mg via INTRAVENOUS
  Filled 2019-10-04 (×6): qty 50

## 2019-10-04 MED ORDER — FAMOTIDINE IN NACL 20-0.9 MG/50ML-% IV SOLN: 20.0000 mg | Freq: Two times a day (BID) | INTRAVENOUS | Status: DC

## 2019-10-04 NOTE — Consult Note (Signed)
Subjective:   HPI  The patient is an 86 year old female who we were asked to see in consultation in regards to anemia.  The patient states that she has been feeling progressively weak over the past 3 months.  About 10 days ago she went to see her primary care physician who checked a hemoglobin and found that she was severely anemic and then checked her stool and it was positive for occult blood.  The patient denies hematemesis melena or hematochezia.  She does have a history of a bleeding duodenal ulcer years ago.  She had a colonoscopy in 2011 which showed a adenomatous colon polyp and diverticulosis.  Patient has been taking Eliquis and some ibuprofen.  Last dose of Eliquis was yesterday.     Past Medical History:  Diagnosis Date  . A-fib ([place])   . Abdominal pain, unspecified site   . Abnormality of gait 06/02/2013  . Arthritis   . Cataracts, bilateral   . Depression   . Diverticulosis of colon (without mention of hemorrhage)   . Diverticulosis of colon (without mention of hemorrhage) 10/27/2012  . Edema 10/27/2012  . GI bleed 06/2002  . Hemorrhage of gastrointestinal tract, unspecified   . History of stomach ulcers   . Hypertension   . Insomnia, unspecified 10/27/2012  . Localized osteoarthrosis not specified whether primary or secondary, lower leg 05/31/2011  . Migraines   . Nausea alone 10/28/2012  . Obesity, unspecified 10/27/2012  . Osteoarthrosis, unspecified whether generalized or localized, lower leg   . Other acariasis    peripherial neuropathy  . Other and unspecified alcohol dependence, continuous drinking behavior 10/28/2012  . Palpitations   . PVC (premature ventricular contraction)   . Unspecified hereditary and idiopathic peripheral neuropathy   . UTI (lower urinary tract infection)    pt state uti w/ no pain   Past Surgical History:  Procedure Laterality Date  . ARTHROSCOPIC REPAIR ACL    . BREAST BIOPSY  1973   left  . BREAST REDUCTION SURGERY  12/23/2003  . cataracts     . CHOLECYSTECTOMY  2005  . TONSILLECTOMY AND ADENOIDECTOMY  1941   Social History   Socioeconomic History  . Marital status: Widowed    Spouse name: Not on file  . Number of children: Not on file  . Years of education: Not on file  . Highest education level: Not on file  Occupational History  . Occupation: retired Marine scientist  Tobacco Use  . Smoking status: Former Smoker    Packs/day: 1.00    Years: 30.00    Pack years: 30.00    Types: Cigarettes    Quit date: 10/09/1983    Years since quitting: 36.0  . Smokeless tobacco: Never Used  Substance and Sexual Activity  . Alcohol use: Yes    Alcohol/week: 1.0 - 2.0 standard drinks    Types: 1 - 2 Shots of liquor per week    Comment: 2 DRINKS A DAY - daily  scotch   . Drug use: No  . Sexual activity: Never  Other Topics Concern  . Not on file  Social History Narrative   Lives at [facility], in [place] -2004   Stopped smoking 1985   POA, DNR, Living Will   Walks with walker   Alcohol scotch daily    Exercise none   Social Determinants of Health   Financial Resource Strain:   . Difficulty of Paying Living Expenses: Not on file  Food Insecurity:   . Worried  About Running Out of Food in the Last Year: Not on file  . Ran Out of Food in the Last Year: Not on file  Transportation Needs:   . Lack of Transportation (Medical): Not on file  . Lack of Transportation (Non-Medical): Not on file  Physical Activity:   . Days of Exercise per Week: Not on file  . Minutes of Exercise per Session: Not on file  Stress:   . Feeling of Stress : Not on file  Social Connections:   . Frequency of Communication with Friends and Family: Not on file  . Frequency of Social Gatherings with Friends and Family: Not on file  . Attends Religious Services: Not on file  . Active Member of Clubs or Organizations: Not on file  . Attends Archivist Meetings: Not on file  . Marital Status: Not on file  Intimate Partner Violence:   . Fear of  Current or Ex-Partner: Not on file  . Emotionally Abused: Not on file  . Physically Abused: Not on file  . Sexually Abused: Not on file   family history includes Breast cancer in her mother; Hypertension in her father and mother.  Current Facility-Administered Medications:  .  acetaminophen (TYLENOL) tablet 650 mg, 650 mg, Oral, Q6H PRN, 650 mg at 10/04/19 0218 **OR** acetaminophen (TYLENOL) suppository 650 mg, 650 mg, Rectal, Q6H PRN, Jani Gravel, MD .  allopurinol (ZYLOPRIM) tablet 100 mg, 100 mg, Oral, Daily, Jani Gravel, MD, 100 mg at 10/04/19 1015 .  dextrose 5 % solution, , Intravenous, Continuous, Samtani, Jai-Gurmukh, MD, Last Rate: 50 mL/hr at 10/04/19 0820, New Bag at 10/04/19 0820 .  docusate sodium (COLACE) capsule 100 mg, 100 mg, Oral, Daily, Jani Gravel, MD, Stopped at 10/04/19 2150 .  famotidine (PEPCID) IVPB 20 mg premix, 20 mg, Intravenous, Daily, Samtani, Jai-Gurmukh, MD .  flecainide (TAMBOCOR) tablet 50 mg, 50 mg, Oral, BID, Jani Gravel, MD, 50 mg at 10/04/19 1015 .  Melatonin TABS 4.5 mg, 4.5 mg, Oral, QHS, Jani Gravel, MD, 4.5 mg at 10/03/19 2228 .  nebivolol (BYSTOLIC) tablet 10 mg, 10 mg, Oral, Daily, Jani Gravel, MD, 10 mg at 10/04/19 1014 .  pantoprazole (PROTONIX) 80 mg in sodium chloride 0.9 % 250 mL (0.32 mg/mL) infusion, 8 mg/hr, Intravenous, Continuous, Schlossman, Erin, MD, Last Rate: 25 mL/hr at 10/04/19 0412, 8 mg/hr at 10/04/19 0412 .  zolpidem (AMBIEN) tablet 5 mg, 5 mg, Oral, QHS, Jani Gravel, MD, 5 mg at 10/03/19 2228 Allergies  Allergen Reactions  . Mirtazapine Other (See Comments)    tremor  . Nickel Other (See Comments)    inflammation  . Other Itching    Acrylic nails, and trace metals  . Prednisone Other (See Comments)    Causes sleep disturbances      Objective:     BP (!) 142/48 (BP Location: Left Arm)   Pulse 68   Temp 98.1 F (36.7 C) (Oral)   Resp 19   Wt 85.1 kg   LMP  (LMP Unknown)   SpO2 98%   BMI 32.20 kg/m   No distress  Heart  regular rhythm  Lungs clear  Abdomen soft and nontender  Laboratory No components found for: D1    Assessment:     Anemia  Heme positive stool  History of duodenal ulcer  Diverticulosis  Multiple medical problems      Plan:     I have told the patient that at this time we do not know where she might of  lost the blood from in the GI tract.  I told her that to further diagnose that we would need to do an EGD and a colonoscopy.  She is agreeable to have an EGD but not a colonoscopy.  I agree we need to hold her Eliquis.  Continue acid suppression.  I will plan on scheduling her for an EGD to further investigate the upper GI tract. Lab Results  Component Value Date   HGB 7.1 (L) 10/04/2019   HGB 5.4 (LL) 10/03/2019   HGB 4.9 (LL) 10/03/2019   HCT 21.8 (L) 10/04/2019   HCT 16.0 (L) 10/03/2019   HCT 16.4 (L) 10/03/2019   ALKPHOS 55 10/04/2019   ALKPHOS 58 10/03/2019   ALKPHOS 65 08/09/2018   AST 11 (L) 10/04/2019   AST 14 (L) 10/03/2019   AST 10 09/27/2019   ALT 10 10/04/2019   ALT 11 10/03/2019   ALT 7 09/27/2019

## 2019-10-04 NOTE — Progress Notes (Addendum)
Pt co/o of itching after about 20 min of FFP NP on call paged new orders given will continue to monitor  VSS 25 mg of Benadryl iv give per NP M Dennys order, pt refused to conplete tranfusion M denny and blood bank notified does not qualify as a transfusion reactionp[er Blood bank or NP

## 2019-10-04 NOTE — Progress Notes (Signed)
Hospitalist progress note   [name] [id number] DOB: 1933/01/08 DOA: 10/03/2019  PCP: Gayland Curry, DO   Narrative:  86 [facility] old white [facility] resident P A. fib Mali score >4 on anticoagulation, prior GI bleed 2003, migraines, cataract, BMI >32, multiple pulmonary nodules, thoracic aortic aneurysm without rupture HTN, depression and anxiety, alcohol habituation Seen at PCP office 1/6 found clinically with pallor and also with dark brown stools Recheck of hemoglobin showed it was down from 13 previously to about 7.5  Patient was admitted-Eliquis stopped ibuprofen stop patient was transfused 2 units PRBC GI has consulted and will determine if needs a scope   Data Reviewed:  BUN/creatinine down from 35/1.10-->28/1.08 LFTs normal Hemoglobin 5.4---2 units PRBC->7.1, platelet 134, WBC 2.3 no differential Assessment & Plan: Acute GI bleed secondary to Eliquis, ibuprofen Keep n.p.o., PPI GTT at 8 mg, await GI input Start dextrose 50 cc/H monitor trends P A. fib Mali score >4 Holding Eliquis at this time continue Tambocor 50 twice daily, Bystolic 10 daily Diverticulosis, prior GI bleed 2003 Unclear circumstances of prior bleed-monitor trends EtOH habituation Unclear HTN Holding Ava pro 75 for now, Lasix 60 daily-see above Gout Continue allopurinol 100 daily Insomnia Depression anxiety Patient during hospital stay can resume Ambien 5 at bedtime melatonin 5 HF Pulmonary nodules Does need outpatient characterization of the same  Subjective: Having predominantly abdominal pain Tells me she was disimpacted at facility and then had 5-6 dark runny stools Currently having central abdominal pain and thinks she is hungry No fever no chills no other issues Consultants:   GI Procedures:   None yet Antimicrobials:   None   Objective: Vitals:   10/04/19 0122 10/04/19 0204 10/04/19 0250 10/04/19 0335  BP:  (!) 154/56 (!) 160/53 (!) 166/58  Pulse: 77 71 69 68   Resp: 18 18 (!) 21 15  Temp: 98.8 F (37.1 C) 98.2 F (36.8 C) 98.4 F (36.9 C) 98.4 F (36.9 C)  TempSrc: Oral Oral Oral Oral  SpO2:  96% 95% 92%  Weight:  85.1 kg      Intake/Output Summary (Last 24 hours) at 10/04/2019 0746 Last data filed at 10/03/2019 2302 Gross per 24 hour  Intake 1015 ml  Output --  Net 1015 ml   Filed Weights   10/04/19 0204  Weight: 85.1 kg    Examination: Awake alert coherent no distress EOMI NCAT Neck soft supple Abdomen soft with mild epigastric tenderness Lower extremities grade 1 lower extremity edema No focal deficit on neurological exam moving all 4 limbs equally power 5/5  Scheduled Meds: . allopurinol  100 mg Oral Daily  . docusate sodium  100 mg Oral Daily  . flecainide  50 mg Oral BID  . Melatonin  4.5 mg Oral QHS  . nebivolol  10 mg Oral Daily  . zolpidem  5 mg Oral QHS   Continuous Infusions: . dextrose 50 mL/hr at 10/04/19 0820  . famotidine (PEPCID) IV    . pantoprozole (PROTONIX) infusion 8 mg/hr (10/04/19 0412)     LOS: 1 day   Time spent: [place], MD [facility]  10/04/2019, 7:46 AM

## 2019-10-05 ENCOUNTER — Inpatient Hospital Stay (HOSPITAL_COMMUNITY): Payer: Medicare Other | Admitting: Anesthesiology

## 2019-10-05 ENCOUNTER — Encounter (HOSPITAL_COMMUNITY)
Admission: EM | Disposition: A | Discharge status: Skilled Nursing Facility | Payer: Self-pay | Source: Home / Self Care | Attending: Family Medicine

## 2019-10-05 HISTORY — PX: ESOPHAGOGASTRODUODENOSCOPY (EGD) WITH PROPOFOL: SHX5813

## 2019-10-05 HISTORY — PX: HOT HEMOSTASIS: SHX5433

## 2019-10-05 LAB — FOLATE: Folate: 9.1 ng/mL (ref >5.9)

## 2019-10-05 LAB — CBC WITH DIFFERENTIAL/PLATELET
Abs Immature Granulocytes: 0.01 10*3/uL (ref 0.00–0.07)
Basophils Absolute: 0 10*3/uL (ref 0.0–0.1)
Basophils Relative: 0 %
Eosinophils Absolute: 0 10*3/uL (ref 0.0–0.5)
Eosinophils Relative: 1 %
HCT: 22.8 % — ABNORMAL LOW (ref 36.0–46.0)
Hemoglobin: 7.2 g/dL — ABNORMAL LOW (ref 12.0–15.0)
Immature Granulocytes: 1 %
Lymphocytes Relative: 40 %
Lymphs Abs: 0.8 10*3/uL (ref 0.7–4.0)
MCH: 30.1 pg (ref 26.0–34.0)
MCHC: 31.6 g/dL (ref 30.0–36.0)
MCV: 95.4 fL (ref 80.0–100.0)
Monocytes Absolute: 0.2 10*3/uL (ref 0.1–1.0)
Monocytes Relative: 9 %
Neutro Abs: 1 10*3/uL — ABNORMAL LOW (ref 1.7–7.7)
Neutrophils Relative %: 49 %
Platelets: 121 10*3/uL — ABNORMAL LOW (ref 150–400)
RBC: 2.39 MIL/uL — ABNORMAL LOW (ref 3.87–5.11)
RDW: 19 % — ABNORMAL HIGH (ref 11.5–15.5)
WBC: 1.9 10*3/uL — ABNORMAL LOW (ref 4.0–10.5)
nRBC: 4.2 % — ABNORMAL HIGH (ref 0.0–0.2)

## 2019-10-05 LAB — BASIC METABOLIC PANEL
Anion gap: 10 (ref 5–15)
BUN: 16 mg/dL (ref 8–23)
CO2: 27 mmol/L (ref 22–32)
Calcium: 8.4 mg/dL — ABNORMAL LOW (ref 8.9–10.3)
Chloride: 101 mmol/L (ref 98–111)
Creatinine, Ser: 0.99 mg/dL (ref 0.44–1.00)
GFR calc Af Amer: >60 mL/min (ref >60)
GFR calc non Af Amer: 52 mL/min — ABNORMAL LOW (ref >60)
Glucose, Bld: 134 mg/dL — ABNORMAL HIGH (ref 70–99)
Potassium: 3.5 mmol/L (ref 3.5–5.1)
Sodium: 138 mmol/L (ref 135–145)

## 2019-10-05 LAB — COMPREHENSIVE METABOLIC PANEL
ALT: 11 U/L (ref 0–44)
AST: 13 U/L — ABNORMAL LOW (ref 15–41)
Albumin: 3 g/dL — ABNORMAL LOW (ref 3.5–5.0)
Alkaline Phosphatase: 52 U/L (ref 38–126)
Anion gap: 9 (ref 5–15)
BUN: 14 mg/dL (ref 8–23)
CO2: 27 mmol/L (ref 22–32)
Calcium: 8.4 mg/dL — ABNORMAL LOW (ref 8.9–10.3)
Chloride: 101 mmol/L (ref 98–111)
Creatinine, Ser: 0.91 mg/dL (ref 0.44–1.00)
GFR calc Af Amer: >60 mL/min (ref >60)
GFR calc non Af Amer: 58 mL/min — ABNORMAL LOW (ref >60)
Glucose, Bld: 125 mg/dL — ABNORMAL HIGH (ref 70–99)
Potassium: 3.8 mmol/L (ref 3.5–5.1)
Sodium: 137 mmol/L (ref 135–145)
Total Bilirubin: 1 mg/dL (ref 0.3–1.2)
Total Protein: 5.1 g/dL — ABNORMAL LOW (ref 6.5–8.1)

## 2019-10-05 LAB — IRON AND TIBC
Iron: 178 ug/dL — ABNORMAL HIGH (ref 28–170)
Saturation Ratios: 44 % — ABNORMAL HIGH (ref 10.4–31.8)
TIBC: 403 ug/dL (ref 250–450)
UIBC: 225 ug/dL

## 2019-10-05 LAB — BPAM FFP
Blood Product Expiration Date: 202101142359
ISSUE DATE / TIME: 202101110219
Unit Type and Rh: 6200

## 2019-10-05 LAB — RETICULOCYTES
Immature Retic Fract: 34.9 % — ABNORMAL HIGH (ref 2.3–15.9)
RBC.: 2.41 MIL/uL — ABNORMAL LOW (ref 3.87–5.11)
Retic Count, Absolute: 84.8 10*3/uL (ref 19.0–186.0)
Retic Ct Pct: 3.5 % — ABNORMAL HIGH (ref 0.4–3.1)

## 2019-10-05 LAB — PREPARE FRESH FROZEN PLASMA: Unit division: 0

## 2019-10-05 LAB — FERRITIN: Ferritin: 33 ng/mL (ref 11–307)

## 2019-10-05 LAB — VITAMIN B12: Vitamin B-12: 853 pg/mL (ref 180–914)

## 2019-10-05 LAB — TECHNOLOGIST SMEAR REVIEW

## 2019-10-05 SURGERY — ESOPHAGOGASTRODUODENOSCOPY (EGD) WITH PROPOFOL
Anesthesia: Monitor Anesthesia Care

## 2019-10-05 MED ORDER — LACTATED RINGERS IV SOLN: INTRAVENOUS | Status: DC | PRN

## 2019-10-05 MED ORDER — SODIUM CHLORIDE 0.9 % IV SOLN: INTRAVENOUS | Status: DC

## 2019-10-05 MED ORDER — LACTATED RINGERS IV SOLN: INTRAVENOUS | Status: DC

## 2019-10-05 MED ORDER — PROPOFOL 500 MG/50ML IV EMUL
INTRAVENOUS | Status: DC | PRN
  Administered 2019-10-05: 50 ug/kg/min via INTRAVENOUS

## 2019-10-05 MED ORDER — LIDOCAINE 2% (20 MG/ML) 5 ML SYRINGE
INTRAMUSCULAR | Status: DC | PRN
  Administered 2019-10-05: 60 mg via INTRAVENOUS

## 2019-10-05 MED ORDER — PROPOFOL 10 MG/ML IV BOLUS
INTRAVENOUS | Status: DC | PRN
  Administered 2019-10-05: 20 mg via INTRAVENOUS
  Administered 2019-10-05: 25 mg via INTRAVENOUS
  Administered 2019-10-05: 20 mg via INTRAVENOUS
  Administered 2019-10-05: 25 mg via INTRAVENOUS

## 2019-10-05 SURGICAL SUPPLY — 15 items

## 2019-10-05 NOTE — Anesthesia Postprocedure Evaluation (Signed)
Anesthesia Post Note  Patient: [name]  Procedure(s) Performed: ESOPHAGOGASTRODUODENOSCOPY (EGD) WITH PROPOFOL (N/A ) HOT HEMOSTASIS (ARGON PLASMA COAGULATION/BICAP) (N/A )     Patient location during evaluation: Endoscopy Anesthesia Type: MAC Level of consciousness: awake and alert Pain management: pain level controlled Vital Signs Assessment: post-procedure vital signs reviewed and stable Respiratory status: spontaneous breathing, nonlabored ventilation, respiratory function stable and patient connected to nasal cannula oxygen Cardiovascular status: stable and blood pressure returned to baseline Postop Assessment: no apparent nausea or vomiting Anesthetic complications: no    Last Vitals:  Vitals:   10/05/19 1531 10/05/19 1540  BP: (!) 106/30 (!) 126/26  Pulse: 63 63  Resp: 15 (!) 23  Temp: 36.9 C   SpO2: 96% 97%    Last Pain:  Vitals:   10/05/19 1531  TempSrc: Oral  PainSc: 0-No pain                 Catalina Gravel

## 2019-10-05 NOTE — Anesthesia Preprocedure Evaluation (Addendum)
Anesthesia Evaluation  Patient identified by MRN, date of birth, ID band Patient awake    Reviewed: Allergy & Precautions, NPO status , Patient's Chart, lab work & pertinent test results, reviewed documented beta blocker date and time   Airway Mallampati: II  TM Distance: <3 FB Neck ROM: Full    Dental  (+) Teeth Intact, Dental Advisory Given   Pulmonary former smoker,    Pulmonary exam normal breath sounds clear to auscultation       Cardiovascular hypertension, Pt. on home beta blockers Normal cardiovascular exam+ dysrhythmias Atrial Fibrillation  Rhythm:Regular Rate:Normal  Echo 09/22/2019:  1. Left ventricular ejection fraction, by visual estimation, is 60 to 65%. The left ventricle has normal function. There is moderately increased left ventricular hypertrophy.  2. Elevated left ventricular end-diastolic pressure.  3. Left ventricular diastolic parameters are consistent with Grade II diastolic dysfunction (pseudonormalization).  4. The left ventricle has no regional wall motion abnormalities.  5. Global right ventricle has normal systolic function.The right ventricular size is normal. No increase in right ventricular wall thickness.  6. Left atrial size was moderately dilated.  7. Right atrial size was normal.  8. The mitral valve is normal in structure. Trivial mitral valve regurgitation. No evidence of mitral stenosis.  9. The tricuspid valve is normal in structure. 10. The aortic valve is normal in structure. Aortic valve regurgitation is mild. Mild to moderate aortic valve sclerosis/calcification without any evidence of aortic stenosis. 11. The pulmonic valve was normal in structure. Pulmonic valve regurgitation is not visualized. 12. Aneurysm of the ascending aorta. 13. There is mild dilatation of the ascending aorta measuring 41 mm. 14. The inferior vena cava is normal in size with greater than 50% respiratory variability,  suggesting right atrial pressure of 3 mmHg.   Neuro/Psych  Headaches, PSYCHIATRIC DISORDERS Depression  Neuromuscular disease    GI/Hepatic Neg liver ROS, GERD  ,  Endo/Other  Obesity   Renal/GU negative Renal ROS     Musculoskeletal  (+) Arthritis ,   Abdominal   Peds  Hematology  (+) Blood dyscrasia (Eliquis; Thrombocytopenia), ,   Anesthesia Other Findings Day of surgery medications reviewed with the patient.  Reproductive/Obstetrics                            Anesthesia Physical Anesthesia Plan  ASA: III  Anesthesia Plan: MAC   Post-op Pain Management:    Induction: Intravenous  PONV Risk Score and Plan: 2 and Propofol infusion and Treatment may vary due to age or medical condition  Airway Management Planned: Nasal Cannula  Additional Equipment:   Intra-op Plan:   Post-operative Plan:   Informed Consent: I have reviewed the patients History and Physical, chart, labs and discussed the procedure including the risks, benefits and alternatives for the proposed anesthesia with the patient or authorized representative who has indicated his/her understanding and acceptance.   Patient has DNR.  Discussed DNR with patient and Suspend DNR.   Dental advisory given  Plan Discussed with: CRNA and Anesthesiologist  Anesthesia Plan Comments: (Discussed risks/benefits/alternatives to MAC sedation including need for ventilatory support, hypotension, need for conversion to general anesthesia.  All patient questions answered.  Patient/guardian wishes to proceed.)       Anesthesia Quick Evaluation

## 2019-10-05 NOTE — Op Note (Signed)
[facility] Patient Name: [name] Procedure Date : 10/05/2019 MRN: [id number] Attending MD: Wonda Horner , MD Date of Birth: 04-08-1933 CSN: [id number] Age: 86 Admit Type: Inpatient Procedure:                Upper GI endoscopy Indications:              Iron deficiency anemia secondary to chronic blood                            loss, Heme positive stool Providers:                Wonda Horner, MD, Glori Bickers, RN, Lazaro Arms,                            Technician Referring MD:              Medicines:                Propofol per Anesthesia Complications:            No immediate complications. Estimated Blood Loss:     Estimated blood loss: none. Procedure:                Pre-Anesthesia Assessment:                           - Prior to the procedure, a History and Physical                            was performed, and patient medications and                            allergies were reviewed. The patient's tolerance of                            previous anesthesia was also reviewed. The risks                            and benefits of the procedure and the sedation                            options and risks were discussed with the patient.                            All questions were answered, and informed consent                            was obtained. Prior Anticoagulants: The patient has                            taken Eliquis (apixaban), last dose was 2 days                            prior to procedure. ASA Grade Assessment: III - A  patient with severe systemic disease. After                            reviewing the risks and benefits, the patient was                            deemed in satisfactory condition to undergo the                            procedure.                           After obtaining informed consent, the endoscope was                            passed under direct vision. Throughout the          procedure, the patient's blood pressure, pulse, and                            oxygen saturations were monitored continuously. The                            [id number]) Olympus gastroscope was                            introduced through the mouth, and advanced to the                            second part of duodenum. The upper GI endoscopy was                            accomplished without difficulty. The patient                            tolerated the procedure well. Scope In: Scope Out: Findings:      Esophagitis was found.      A single small angiodysplastic lesion with no bleeding was found in the       gastric body. Coagulation for hemostasis using argon plasma was       successful.      The examined duodenum was normal. Impression:               - Reflux esophagitis.                           - A single non-bleeding angiodysplastic lesion in                            the stomach. Treated with argon plasma coagulation                            (APC).                           - Normal examined duodenum.                           -  No specimens collected. Recommendation:           - Advance diet as tolerated.                           - Continue present medications. Procedure Code(s):        --- Professional ---                           [phone], Esophagogastroduodenoscopy, flexible,                            transoral; with control of bleeding, any method Diagnosis Code(s):        --- Professional ---                           K21.00, Gastro-esophageal reflux disease with                            esophagitis, without bleeding                           K31.819, Angiodysplasia of stomach and duodenum                            without bleeding                           D50.0, Iron deficiency anemia secondary to blood                            loss (chronic)                           R19.5, Other fecal abnormalities CPT copyright 2019 American Medical  Association. All rights reserved. The codes documented in this report are preliminary and upon coder review may  be revised to meet current compliance requirements. Wonda Horner, MD 10/05/2019 3:32:17 PM This report has been signed electronically. Number of Addenda: 0

## 2019-10-05 NOTE — H&P (Signed)
In endo for egd to evaluate ugi tract for source of anemia and heme positive stool. PE:. No distress Heart RRR Lungs clear Abdomen soft  For EGD

## 2019-10-05 NOTE — Progress Notes (Signed)
Hospitalist progress note   TRACI PEERS [id number] DOB: Oct 13, 1932 DOA: 10/03/2019  PCP: Gayland Curry, DO   Narrative:  86 year old white female [facility] resident P A. fib Mali score >4 on anticoagulation, prior GI bleed 2003, migraines, cataract, BMI >32, multiple pulmonary nodules, thoracic aortic aneurysm without rupture HTN, depression and anxiety, alcohol habituation Seen at PCP office 1/6 found clinically with pallor and also with dark brown stools Recheck of hemoglobin showed it was down from 13 previously to about 7.5  Patient was admitted-Eliquis stopped ibuprofen stop patient was transfused 2 units PRBC GI has consulted and will determine if needs a scope   Data Reviewed:  BUN/creatinine down from 35/1.10--> 16/0.99 LFTs normal Hemoglobin 5.4---2 units PRBC->7.2  WBC 1.9, ANC 1000  platelet 134--121,   Assessment & Plan: Acute GI bleed secondary to Eliquis, ibuprofen n.p.o., PPI GTT at 8 mg, await GI input Start dextrose 50 cc/H monitor trends P A. fib Mali score >4 Holding Eliquis at this time continue Tambocor 50 twice daily, Bystolic 10 daily Diverticulosis, prior GI bleed 2003 Unclear circumstances of prior bleed-monitor trends Relative pancytopenia ANC 1000, white count 1.9, platelet 121-order platelet smear, iron studies, reticulocyte count may need to consult Hematology and follow as OP-do not know if patient is a good candidate for anticoagulation EtOH habituation -tells me she drinks 2 ETOH daily for the past several years-unwilling to change her habits HTN Holding Ava pro 75 for now, Lasix 60 daily-see above Gout Continue allopurinol 100 daily Insomnia Depression anxiety Patient during hospital stay can resume Ambien 5 at bedtime melatonin 5 HF Pulmonary nodules Does need outpatient characterization of the same  SCD, Not ready for d/c--await scope Likely can d/c home am 1/13 if all stable with close Cardiology/Onc Follow  up  Subjective: Well-feels stronger after trasnfusions No cp No swelling No further dark or tarry stool  Consultants:   GI Procedures:   None yet Antimicrobials:   None   Objective: Vitals:   10/04/19 1010 10/04/19 2035 10/05/19 0023 10/05/19 0421  BP: (!) 142/48 (!) 157/54 113/87 (!) 160/55  Pulse:  70 67 71  Resp: 19 17 20  (!) 21  Temp: 98.1 F (36.7 C) 98.7 F (37.1 C) 98.6 F (37 C) 98.6 F (37 C)  TempSrc: Oral Oral Oral Oral  SpO2: 98% 93% 95% 95%  Weight:    85.5 kg    Intake/Output Summary (Last 24 hours) at 10/05/2019 0754 Last data filed at 10/05/2019 0600 Gross per 24 hour  Intake 1627.75 ml  Output -  Net 1627.75 ml   Filed Weights   10/04/19 0204 10/05/19 0421  Weight: 85.1 kg 85.5 kg    Examination: eomi ncat no focal deficit No pallor  No ict abd soft no HSM Cannot appreciate Splenomegaly  no Le edema  ROm intact mood euthymic  Scheduled Meds: . allopurinol  100 mg Oral Daily  . docusate sodium  100 mg Oral Daily  . flecainide  50 mg Oral BID  . Melatonin  4.5 mg Oral QHS  . nebivolol  10 mg Oral Daily  . zolpidem  5 mg Oral QHS   Continuous Infusions: . sodium chloride    . dextrose 50 mL/hr at 10/04/19 0820  . famotidine (PEPCID) IV 20 mg (10/04/19 1209)  . pantoprozole (PROTONIX) infusion 8 mg/hr (10/05/19 0029)     LOS: 2 days   Time spent: [place], MD [facility]  10/05/2019, 7:54 AM

## 2019-10-05 NOTE — Transfer of Care (Signed)
Immediate Anesthesia Transfer of Care Note  Patient: [name]  Procedure(s) Performed: ESOPHAGOGASTRODUODENOSCOPY (EGD) WITH PROPOFOL (N/A ) HOT HEMOSTASIS (ARGON PLASMA COAGULATION/BICAP) (N/A )  Patient Location: Endoscopy Unit  Anesthesia Type:MAC  Level of Consciousness: awake and patient cooperative  Airway & Oxygen Therapy: Patient Spontanous Breathing and Patient connected to nasal cannula oxygen  Post-op Assessment: Report given to RN and Post -op Vital signs reviewed and stable  Post vital signs: Reviewed and stable  Last Vitals:  Vitals Value Taken Time  BP 106/30 10/05/19 1531  Temp    Pulse 63 10/05/19 1531  Resp 15 10/05/19 1531  SpO2 96 % 10/05/19 1531    Last Pain:  Vitals:   10/05/19 1354  TempSrc: Oral  PainSc: 0-No pain      Patients Stated Pain Goal: 0 (76/39/43 2003)  Complications: No apparent anesthesia complications

## 2019-10-05 NOTE — Anesthesia Procedure Notes (Signed)
Procedure Name: MAC Date/Time: 10/05/2019 3:03 PM Performed by: Renato Shin, CRNA Pre-anesthesia Checklist: Patient identified, Emergency Drugs available, Suction available and Patient being monitored Patient Re-evaluated:Patient Re-evaluated prior to induction Oxygen Delivery Method: Nasal cannula Preoxygenation: Pre-oxygenation with 100% oxygen Induction Type: IV induction Placement Confirmation: positive ETCO2 and breath sounds checked- equal and bilateral Dental Injury: Teeth and Oropharynx as per pre-operative assessment

## 2019-10-06 ENCOUNTER — Encounter: Payer: Self-pay | Admitting: Internal Medicine

## 2019-10-06 ENCOUNTER — Encounter: Payer: Self-pay | Admitting: *Deleted

## 2019-10-06 DIAGNOSIS — D709 Neutropenia, unspecified: Secondary | ICD-10-CM

## 2019-10-06 DIAGNOSIS — K922 Gastrointestinal hemorrhage, unspecified: Secondary | ICD-10-CM

## 2019-10-06 LAB — HIV ANTIBODY (ROUTINE TESTING W REFLEX): HIV Screen 4th Generation wRfx: NONREACTIVE

## 2019-10-06 LAB — CBC WITH DIFFERENTIAL/PLATELET
Abs Immature Granulocytes: 0 10*3/uL (ref 0.00–0.07)
Basophils Absolute: 0 10*3/uL (ref 0.0–0.1)
Basophils Relative: 0 %
Eosinophils Absolute: 0 10*3/uL (ref 0.0–0.5)
Eosinophils Relative: 0 %
HCT: 21.1 % — ABNORMAL LOW (ref 36.0–46.0)
Hemoglobin: 6.6 g/dL — CL (ref 12.0–15.0)
Lymphocytes Relative: 48 %
Lymphs Abs: 0.8 10*3/uL (ref 0.7–4.0)
MCH: 30.3 pg (ref 26.0–34.0)
MCHC: 31.3 g/dL (ref 30.0–36.0)
MCV: 96.8 fL (ref 80.0–100.0)
Monocytes Absolute: 0 10*3/uL — ABNORMAL LOW (ref 0.1–1.0)
Monocytes Relative: 2 %
Neutro Abs: 0.9 10*3/uL — ABNORMAL LOW (ref 1.7–7.7)
Neutrophils Relative %: 50 %
Platelets: 128 10*3/uL — ABNORMAL LOW (ref 150–400)
RBC: 2.18 MIL/uL — ABNORMAL LOW (ref 3.87–5.11)
RDW: 18.7 % — ABNORMAL HIGH (ref 11.5–15.5)
WBC: 1.7 10*3/uL — ABNORMAL LOW (ref 4.0–10.5)
nRBC: 4.7 % — ABNORMAL HIGH (ref 0.0–0.2)
nRBC: 5 /100 WBC — ABNORMAL HIGH

## 2019-10-06 LAB — RETICULOCYTES
Immature Retic Fract: 24.1 % — ABNORMAL HIGH (ref 2.3–15.9)
RBC.: 3.16 MIL/uL — ABNORMAL LOW (ref 3.87–5.11)
Retic Count, Absolute: 77.4 10*3/uL (ref 19.0–186.0)
Retic Ct Pct: 2.5 % (ref 0.4–3.1)

## 2019-10-06 LAB — PREPARE RBC (CROSSMATCH)

## 2019-10-06 MED ORDER — FUROSEMIDE 10 MG/ML IJ SOLN
20.0000 mg | Freq: Once | INTRAMUSCULAR | Status: AC
  Administered 2019-10-06: 08:00:00 20 mg via INTRAVENOUS
  Filled 2019-10-06: qty 2

## 2019-10-06 MED ORDER — SODIUM CHLORIDE 0.9% IV SOLUTION: Freq: Once | INTRAVENOUS | Status: DC

## 2019-10-06 MED ORDER — DIPHENHYDRAMINE HCL 25 MG PO CAPS
25.0000 mg | ORAL_CAPSULE | Freq: Once | ORAL | Status: DC
  Filled 2019-10-06: qty 1

## 2019-10-06 MED ORDER — ACETAMINOPHEN 325 MG PO TABS
650.0000 mg | ORAL_TABLET | Freq: Once | ORAL | Status: AC
  Administered 2019-10-07: 22:00:00 650 mg via ORAL
  Filled 2019-10-06: qty 2

## 2019-10-06 NOTE — Progress Notes (Addendum)
Hospitalist progress note   [name] [id number] DOB: Apr 20, 1933 DOA: 10/03/2019  PCP: Gayland Curry, DO   Narrative:  86 year old white female [facility] resident P A. fib Mali score >4 on anticoagulation, prior GI bleed 2003, migraines, cataract, BMI >32, multiple pulmonary nodules, thoracic aortic aneurysm without rupture HTN, depression and anxiety, alcohol habituation Seen at PCP office 1/6 found clinically with pallor and also with dark brown stools Recheck of hemoglobin showed it was down from 13 previously to about 7.5  Patient was admitted-Eliquis stopped ibuprofen stop patient was transfused 2 units PRBC GI has consulted and will determine if needs a scope   Data Reviewed:  BUN/creatinine down from 35/1.10--> 16/0.99 LFTs normal Hemoglobin 5.4---2 units PRBC->7.2 -->6.6 WBC 1.97 ANC 900  platelet 134--121,   Assessment & Plan: Acute GI bleed secondary to Eliquis, ibuprofen Advance diet per GI as tol Defer to GI if and when to resume ELiquis from APC perspective--also duration PPI Hemoglobin down again today--transfusing 2U mor ePRBC P A. fib Mali score >4 Holding Eliquis at this time continue Tambocor 50 twice daily, Bystolic 10 daily Diverticulosis, prior GI bleed 2003 Unclear circumstances of prior bleed-monitor trends Relative pancytopenia Etiology potentially 2/2 ETOH vs Allopurinol suppression ? good candidate for anticoagulation Have asked for Heme an opinion re: work-up for cytopenia EtOH habituation -tells me she drinks 2 ETOH daily for the past several years-unwilling to change her habits HTN Holding Ava pro 75 for now, Lasix 60 daily-see above Gout Continue allopurinol 100 daily--might need dose adjustemnt Insomnia Depression anxiety Patient during hospital stay can resume Ambien 5 at bedtime melatonin 5 HF Pulmonary nodules Does need outpatient characterization of the same  SCD, Not ready for d/c-await work-up if needed from Heme Ask PT to  see--visibly winded today from ambulation just to RR--Lives at [facility]--? Need ? level of care  Subjective:  Some dark tarry stool Being transfused 2 U Visibly winded to RR--usually independent and gets 5 hours of Care at [facility]  Consultants:   GI Procedures:   None yet Antimicrobials:   None  Objective: Vitals:   10/06/19 0440 10/06/19 0456 10/06/19 0511 10/06/19 0745  BP: (!) 155/59  (!) 155/50 (!) 150/57  Pulse: 67  70 65  Resp: 15 (!) 22 (!) 23 19  Temp: 98.1 F (36.7 C)  98 F (36.7 C) 98.2 F (36.8 C)  TempSrc: Oral  Oral Oral  SpO2: 99%  93% 94%  Weight:      Height:        Intake/Output Summary (Last 24 hours) at 10/06/2019 0924 Last data filed at 10/06/2019 0757 Gross per 24 hour  Intake 2260.21 ml  Output --  Net 2260.21 ml   Filed Weights   10/04/19 0204 10/05/19 0421 10/06/19 0436  Weight: 85.1 kg 85.5 kg 84.6 kg    Examination: no focal deficit No pallor  No ict abd soft no HSM--no Splenomegaly  no Le edema  ROm intact mood euthymic  Scheduled Meds: . sodium chloride   Intravenous Once  . sodium chloride   Intravenous Once  . acetaminophen  650 mg Oral Once  . allopurinol  100 mg Oral Daily  . diphenhydrAMINE  25 mg Oral Once  . docusate sodium  100 mg Oral Daily  . flecainide  50 mg Oral BID  . Melatonin  4.5 mg Oral QHS  . nebivolol  10 mg Oral Daily  . zolpidem  5 mg Oral QHS   Continuous Infusions: . famotidine (PEPCID)  IV Stopped (10/05/19 1205)  . pantoprozole (PROTONIX) infusion 8 mg/hr (10/06/19 0600)     LOS: 3 days   Time spent: [place], MD [facility]  10/06/2019, 9:24 AM

## 2019-10-06 NOTE — Progress Notes (Addendum)
[name]   DOB:Jan 29, 1933   LK#:[id number]   DGU#:[id number]  Subjective: [name] is a pleasant 86 year old woman with multiple comorbidities who was admitted on 10/03/2019 for a GI bleed on Xarelto, anemia, with a hemoglobin of 5.4.  She received 2 units of PRBCs, 1 unit of FFP on 1/10-1/11 and her hemoglobin improved to 7.2.  She underwent EGD on 10/05/2019 performed by Dr. Penelope Coop.  Her hemoglobin  It decreased to 6.6 today after a dark stool last night and she received two additional units of blood today.  Also, she has had a declining WBC since admission, along with a declining neutrophil count.  Her ANC today is 0.9, her plt count is also mildly decreased at 128.    Charron notes that she is tired and short of breath.  She says she is ready to feel better.  She is getting out of bed and onto the bedside commode.  She is supposed to work with PT today.  She notes easy bruising.  She denies fever, chills, night sweats, lymphadenopathy.  She is not in any pain.  She notes that she suffers from intermittent nausea that sometimes gives her the feeling of impending doom.  She denies any vomiting.  She has no cough, chest pain, palpitations.  She is without any oral ulcerations, or swelling.  A detailed ROS Was otherwise non contributory.     Objective:  Vitals:   10/06/19 0812 10/06/19 1038  BP: (!) 168/60 (!) 148/57  Pulse: 70 62  Resp: (!) 24 18  Temp: 98.2 F (36.8 C) 98.4 F (36.9 C)  SpO2: 96% 97%    Body mass index is 32.01 kg/m.  Intake/Output Summary (Last 24 hours) at 10/06/2019 1209 Last data filed at 10/06/2019 0757 Gross per 24 hour  Intake 2260.21 ml  Output --  Net 2260.21 ml     Sclerae unicteric  Mask in place  No cervical or supraclavicular adenopathy  Lungs no rales or wheezes  Heart regular rate and rhythm  Abdomen soft, +BS  Neuro nonfocal  Skin: ecchymosis noted on bilateral forearms  Ext: scant BLE  CBG (last 3)  No results for input(s): GLUCAP in the last 72  hours.   Labs:  Lab Results  Component Value Date   WBC 1.7 (L) 10/06/2019   HGB 6.6 (LL) 10/06/2019   HCT 21.1 (L) 10/06/2019   MCV 96.8 10/06/2019   PLT 128 (L) 10/06/2019   NEUTROABS 0.9 (L) 10/06/2019    '@LASTCHEMISTRY' @  Urine Studies No results for input(s): UHGB, CRYS in the last 72 hours.  Invalid input(s): UACOL, UAPR, USPG, UPH, UTP, UGL, UKET, UBIL, UNIT, UROB, [place], UEPI, UWBC, Duwayne [place], [place]  Basic Metabolic Panel: Recent Labs  Lab 10/03/19 1756 10/03/19 1809 10/04/19 0550 10/05/19 0317 10/05/19 1005  NA 137 136 139 138 137  K 3.8 3.9 3.8 3.5 3.8  CL 97* 96* 100 101 101  CO2 27  --  '29 27 27  ' GLUCOSE 160* 154* 133* 134* 125*  BUN 35* 36* 28* 16 14  CREATININE 1.07* 1.10* 1.08* 0.99 0.91  CALCIUM 8.4*  --  8.5* 8.4* 8.4*   GFR Estimated Creatinine Clearance: 48.5 mL/min (by C-G formula based on SCr of 0.91 mg/dL). Liver Function Tests: Recent Labs  Lab 10/03/19 1756 10/04/19 0550 10/05/19 1005  AST 14* 11* 13*  ALT '11 10 11  ' ALKPHOS 58 55 52  BILITOT 1.0 1.0 1.0  PROT 5.4* 5.2* 5.1*  ALBUMIN  3.1* 3.1* 3.0*   Recent Labs  Lab 10/03/19 1756  LIPASE 19   No results for input(s): AMMONIA in the last 168 hours. Coagulation profile Recent Labs  Lab 10/03/19 1756 10/04/19 0550  INR 2.2* 1.6*    CBC: Recent Labs  Lab 10/03/19 1756 10/03/19 1809 10/04/19 0550 10/05/19 0317 10/06/19 0257  WBC 2.7*  --  2.3* 1.9* 1.7*  NEUTROABS 1.8  --   --  1.0* 0.9*  HGB 4.9* 5.4* 7.1* 7.2* 6.6*  HCT 16.4* 16.0* 21.8* 22.8* 21.1*  MCV 99.4  --  92.0 95.4 96.8  PLT 159  --  134* 121* 128*   Cardiac Enzymes: No results for input(s): CKTOTAL, CKMB, CKMBINDEX, TROPONINI in the last 168 hours. BNP: Invalid input(s): POCBNP CBG: No results for input(s): GLUCAP in the last 168 hours. D-Dimer No results for input(s): DDIMER in the last 72 hours. Hgb A1c No results for input(s): HGBA1C in the last 72 hours. Lipid Profile No results  for input(s): CHOL, HDL, LDLCALC, TRIG, CHOLHDL, LDLDIRECT in the last 72 hours. Thyroid function studies No results for input(s): TSH, T4TOTAL, T3FREE, THYROIDAB in the last 72 hours.  Invalid input(s): FREET3 Anemia work up Recent Labs    10/05/19 0819  VITAMINB12 853  FOLATE 9.1  FERRITIN 33  TIBC 403  IRON 178*  RETICCTPCT 3.5*   Microbiology Recent Results (from the past 240 hour(s))  Respiratory Panel by RT PCR (Flu A&B, Covid) - Nasopharyngeal Swab     Status: None   Collection Time: 10/03/19  7:04 PM   Specimen: Nasopharyngeal Swab  Result Value Ref Range Status   SARS Coronavirus 2 by RT PCR NEGATIVE NEGATIVE Final    Comment: (NOTE) SARS-CoV-2 target nucleic acids are NOT DETECTED. The SARS-CoV-2 RNA is generally detectable in upper respiratoy specimens during the acute phase of infection. The lowest concentration of SARS-CoV-2 viral copies this assay can detect is 131 copies/mL. A negative result does not preclude SARS-Cov-2 infection and should not be used as the sole basis for treatment or other patient management decisions. A negative result may occur with  improper specimen collection/handling, submission of specimen other than nasopharyngeal swab, presence of viral mutation(s) within the areas targeted by this assay, and inadequate number of viral copies (<131 copies/mL). A negative result must be combined with clinical observations, patient history, and epidemiological information. The expected result is Negative. Fact Sheet for Patients:  PinkCheek.be Fact Sheet for Healthcare Providers:  GravelBags.it This test is not yet ap proved or cleared by the [place] FDA and  has been authorized for detection and/or diagnosis of SARS-CoV-2 by FDA under an Emergency Use Authorization (EUA). This EUA will remain  in effect (meaning this test can be used) for the duration of the COVID-19 declaration  under Section 564(b)(1) of the Act, 21 U.S.C. section 360bbb-3(b)(1), unless the authorization is terminated or revoked sooner.    Influenza A by PCR NEGATIVE NEGATIVE Final   Influenza B by PCR NEGATIVE NEGATIVE Final    Comment: (NOTE) The Xpert Xpress SARS-CoV-2/FLU/RSV assay is intended as an aid in  the diagnosis of influenza from Nasopharyngeal swab specimens and  should not be used as a sole basis for treatment. Nasal washings and  aspirates are unacceptable for Xpert Xpress SARS-CoV-2/FLU/RSV  testing. Fact Sheet for Patients: PinkCheek.be Fact Sheet for Healthcare Providers: GravelBags.it This test is not yet approved or cleared by the [place] FDA and  has been authorized for detection and/or diagnosis of SARS-CoV-2 by  FDA under an Emergency Use Authorization (EUA). This EUA will remain  in effect (meaning this test can be used) for the duration of the  Covid-19 declaration under Section 564(b)(1) of the Act, 21  U.S.C. section 360bbb-3(b)(1), unless the authorization is  terminated or revoked. Performed at [facility], [place] [address] [place], [place] [address]       Studies:  No results found.  Assessment/Plan: 86 y.o. woman with multiple medical issues admitted with GI bleed on Xarelto and now is moderately neutropenic.    1. Pancytopenia: I talked to Everly about causes of decreased counts.  Her ANC is moderately decreased, and her platelets are mildly decreased.  Causes may include inflammation, infection, ETOH abuse, and a bone marrow disorder.  I ordered an ANA, HIV, and hepatitis C panel be drawn today to evaluate for the first two.  I explained to [facility] the the bone marrow is a factory where all of her blood cells are made, and sometimes if something that is being produced starts to decrease, we have to go to the "factory" to evaluate for why that production is decreased.  To do this, we perform a  procedure called a bone marrow biopsy.  I let her know that at this point, we aren't sure if what is going on is a bone marrow issue, so for now, we will evaluate for other reasons, and monitor her.  We can discuss doing a bone marrow biopsy further and what that entails if needed down the road.  Her decreased counts are likely secondary to her acute illness.  Will get further tests, await those results, and can recheck her counts/f/u with her as outpatient.    I let Noreta know that Dr. Lindi Adie will be by to see her later this afternoon.     Wilber Bihari, NP 10/06/2019  12:09 PM Medical Oncology and Hematology [facility] [address] [place], [place] [address] Tel. [phone]    Fax. [phone]   Attending Note  I personally saw the patient, reviewed the chart and examined the patient. The plan of care was discussed with the patient  . I agree with the assessment and plan as documented above. Thank you very much for the consultation.  1.  Leukopenia: Primarily low neutrophil count, ANC 0.9 I suspect this is related to the acute illness and hospitalization.  Prior to this hospitalization her white count has been fine. We are checking H41, folic acid as well as ANA to rule out any other causes. 2.  Severe anemia due to GI bleed and requiring blood transfusions.  Iron studies have been ordered to see if she needs iron infusions.  She gets plenty of time for the blood transfusions as well. 3.  Thrombocytopenia: Relatively stable and does not need work-up for it.   The low counts are probably related to bone marrow suppression from the acute illness as well as having low reserves given her age and alcoholism. Patient tells me that she is not interested in doing any invasive procedures like bone marrow biopsies. I do not believe we need to even consider doing a bone marrow biopsy at this time.  She can be watched conservatively.  If her hemoglobin stabilizes and she ends up  getting discharged and we can see her in follow-up in 1 to 2 weeks after discharge.

## 2019-10-06 NOTE — Progress Notes (Signed)
Patient had a black-colored stool last night.  Hemoglobin dropped.  She is receiving blood transfusion.  She is in no distress.  She had APC done of a gastric angiodysplasia.  She is on full liquids.  I would continue her on full liquids.  I would continue to hold Eliquis.  Continue PPI therapy.  Observe clinical course.

## 2019-10-07 LAB — TYPE AND SCREEN
ABO/RH(D): O NEG
Antibody Screen: NEGATIVE
Unit division: 0
Unit division: 0
Unit division: 0
Unit division: 0
Unit division: 0
Unit division: 0

## 2019-10-07 LAB — CBC WITH DIFFERENTIAL/PLATELET
Abs Immature Granulocytes: 0.02 10*3/uL (ref 0.00–0.07)
Basophils Absolute: 0 10*3/uL (ref 0.0–0.1)
Basophils Relative: 0 %
Eosinophils Absolute: 0 10*3/uL (ref 0.0–0.5)
Eosinophils Relative: 1 %
HCT: 27.6 % — ABNORMAL LOW (ref 36.0–46.0)
Hemoglobin: 9.2 g/dL — ABNORMAL LOW (ref 12.0–15.0)
Immature Granulocytes: 1 %
Lymphocytes Relative: 46 %
Lymphs Abs: 1 10*3/uL (ref 0.7–4.0)
MCH: 30.4 pg (ref 26.0–34.0)
MCHC: 33.3 g/dL (ref 30.0–36.0)
MCV: 91.1 fL (ref 80.0–100.0)
Monocytes Absolute: 0.2 10*3/uL (ref 0.1–1.0)
Monocytes Relative: 11 %
Neutro Abs: 0.9 10*3/uL — ABNORMAL LOW (ref 1.7–7.7)
Neutrophils Relative %: 41 %
Platelets: 119 10*3/uL — ABNORMAL LOW (ref 150–400)
RBC: 3.03 MIL/uL — ABNORMAL LOW (ref 3.87–5.11)
RDW: 17.9 % — ABNORMAL HIGH (ref 11.5–15.5)
WBC: 2.2 10*3/uL — ABNORMAL LOW (ref 4.0–10.5)
nRBC: 1.4 % — ABNORMAL HIGH (ref 0.0–0.2)

## 2019-10-07 LAB — BPAM RBC
Blood Product Expiration Date: 202101142359
Blood Product Expiration Date: 202101172359
Blood Product Expiration Date: 202102022359
Blood Product Expiration Date: 202102022359
Blood Product Expiration Date: 202102102359
Blood Product Expiration Date: 202102102359
ISSUE DATE / TIME: 202101102010
ISSUE DATE / TIME: 202101102254
ISSUE DATE / TIME: 202101111356
ISSUE DATE / TIME: 202101130451
ISSUE DATE / TIME: 202101130751
Unit Type and Rh: 5100
Unit Type and Rh: 5100
Unit Type and Rh: 9500
Unit Type and Rh: 9500
Unit Type and Rh: 9500
Unit Type and Rh: 9500

## 2019-10-07 LAB — COMPREHENSIVE METABOLIC PANEL
ALT: 11 U/L (ref 0–44)
AST: 13 U/L — ABNORMAL LOW (ref 15–41)
Albumin: 2.9 g/dL — ABNORMAL LOW (ref 3.5–5.0)
Alkaline Phosphatase: 57 U/L (ref 38–126)
Anion gap: 10 (ref 5–15)
BUN: 9 mg/dL (ref 8–23)
CO2: 27 mmol/L (ref 22–32)
Calcium: 8.3 mg/dL — ABNORMAL LOW (ref 8.9–10.3)
Chloride: 99 mmol/L (ref 98–111)
Creatinine, Ser: 0.99 mg/dL (ref 0.44–1.00)
GFR calc Af Amer: >60 mL/min (ref >60)
GFR calc non Af Amer: 52 mL/min — ABNORMAL LOW (ref >60)
Glucose, Bld: 109 mg/dL — ABNORMAL HIGH (ref 70–99)
Potassium: 3.6 mmol/L (ref 3.5–5.1)
Sodium: 136 mmol/L (ref 135–145)
Total Bilirubin: 1.1 mg/dL (ref 0.3–1.2)
Total Protein: 5.1 g/dL — ABNORMAL LOW (ref 6.5–8.1)

## 2019-10-07 LAB — ANTINUCLEAR ANTIBODIES, IFA: ANA Ab, IFA: NEGATIVE

## 2019-10-07 LAB — SARS CORONAVIRUS 2 (TAT 6-24 HRS): SARS Coronavirus 2: NEGATIVE

## 2019-10-07 MED ORDER — HYDRALAZINE HCL 20 MG/ML IJ SOLN
5.0000 mg | Freq: Once | INTRAMUSCULAR | Status: AC
  Administered 2019-10-07: 23:00:00 5 mg via INTRAVENOUS
  Filled 2019-10-07: qty 1

## 2019-10-07 NOTE — Evaluation (Signed)
Physical Therapy Evaluation Patient Details Name: [name] MRN: [id number] DOB: 03-13-1933 Today's Date: 10/07/2019   History of Present Illness  86 year old white female [facility] facility resident P A. fib Mali score >4 on anticoagulation, prior GI bleed 2003, migraines, cataract, BMI >32, multiple pulmonary nodules, thoracic aortic aneurysm without rupture HTN, depression and anxiety, alcohol habituation. Pt admitted for GIB.  Clinical Impression  Pt presents to PT with deficits in functional mobility, gait, balance, endurance, power. Pt is able to ambulate very short household distances with use of RW but fatigues quickly and is shaky and unsteady during ambulation. Pt's endurance and strength deficits place her at a high falls risk, and she is home alone for most hours of the day. Pt will benefit from continued acute PT services to improve LE strength and activity tolerance in an effort to reduce falls risk.    Follow Up Recommendations SNF;Supervision/Assistance - 24 hour    Equipment Recommendations  None recommended by PT(defer to post-acute setting)    Recommendations for Other Services       Precautions / Restrictions Precautions Precautions: Fall Restrictions Weight Bearing Restrictions: No      Mobility  Bed Mobility Overal bed mobility: Needs Assistance Bed Mobility: Sit to Supine;Supine to Sit     Supine to sit: Supervision Sit to supine: Supervision      Transfers Overall transfer level: Needs assistance Equipment used: Rolling walker (2 wheeled) Transfers: Sit to/from Stand Sit to Stand: Supervision            Ambulation/Gait Ambulation/Gait assistance: Supervision;Min guard Gait Distance (Feet): 15 Feet(15' x 2 trials) Assistive device: Rolling walker (2 wheeled) Gait Pattern/deviations: Step-to pattern;Wide base of support Gait velocity: reduced Gait velocity interpretation: <1.8 ft/sec, indicate of risk for recurrent falls General Gait  Details: pt with short step to gait with increased double stance time. shortened step length bilaterally with increased WB through Geiger Mobility    Modified Rankin (Stroke Patients Only)       Balance Overall balance assessment: Needs assistance Sitting-balance support: No upper extremity supported;Feet supported Sitting balance-Leahy Scale: Good Sitting balance - Comments: modI   Standing balance support: Bilateral upper extremity supported Standing balance-Leahy Scale: Good Standing balance comment: close supervision for static standing balance                             Pertinent Vitals/Pain Pain Assessment: No/denies pain    Home Living Family/patient expects to be discharged to:: Other (Comment)(independent living)                 Additional Comments: [facility] in [facility], has aide for 5 hrs/day    Prior Function Level of Independence: Independent with assistive device(s);Needs assistance   Gait / Transfers Assistance Needed: pt is modI with RW in the home, utilizes wheelchair which someone pushes her in for community mobility  ADL's / Homemaking Assistance Needed: Supervision for bathing  Comments: meals delivered, does not drive     Hand Dominance        Extremity/Trunk Assessment   Upper Extremity Assessment Upper Extremity Assessment: Generalized weakness    Lower Extremity Assessment Lower Extremity Assessment: Generalized weakness    Cervical / Trunk Assessment Cervical / Trunk Assessment: Kyphotic  Communication   Communication: No difficulties  Cognition Arousal/Alertness: Awake/alert Behavior During Therapy: WFL for tasks assessed/performed Overall  Cognitive Status: Within Functional Limits for tasks assessed                                        General Comments General comments (skin integrity, edema, etc.): VSS    Exercises      Assessment/Plan    PT Assessment Patient needs continued PT services  PT Problem List Decreased strength;Decreased activity tolerance;Decreased balance;Decreased mobility;Cardiopulmonary status limiting activity       PT Treatment Interventions DME instruction;Gait training;Functional mobility training;Therapeutic activities;Therapeutic exercise;Balance training;Neuromuscular re-education;Patient/family education    PT Goals (Current goals can be found in the Care Plan section)  Acute Rehab PT Goals Patient Stated Goal: To improve strength and walking PT Goal Formulation: With patient Time For Goal Achievement: 10/21/19 Potential to Achieve Goals: Good Additional Goals Additional Goal #1: Pt will maintain dynamic standing balance within 10 inches of her base of support with unilateral UE support of the LRAD, modI.    Frequency Min 2X/week   Barriers to discharge        Co-evaluation               AM-PAC PT "6 Clicks" Mobility  Outcome Measure Help needed turning from your back to your side while in a flat bed without using bedrails?: None Help needed moving from lying on your back to sitting on the side of a flat bed without using bedrails?: None Help needed moving to and from a bed to a chair (including a wheelchair)?: None Help needed standing up from a chair using your arms (e.g., wheelchair or bedside chair)?: None Help needed to walk in hospital room?: A Little Help needed climbing 3-5 steps with a railing? : A Little 6 Click Score: 22    End of Session   Activity Tolerance: Patient limited by fatigue Patient left: in bed;with call bell/phone within reach;with nursing/sitter in room Nurse Communication: Mobility status PT Visit Diagnosis: Muscle weakness (generalized) (M62.81);Difficulty in walking, not elsewhere classified (R26.2);Unsteadiness on feet (R26.81)    Time: [id number] PT Time Calculation (min) (ACUTE ONLY): 19 min   Charges:   PT  Evaluation $PT Eval Moderate Complexity: 1 Mod          Zenaida Niece, PT, DPT Acute Rehabilitation Pager: [phone]   Zenaida Niece 10/07/2019, 10:55 AM

## 2019-10-07 NOTE — Care Management Important Message (Signed)
Important Message  Patient Details  Name: RISA BALLI MRN: [id number] Date of Birth: 1933/04/11   Medicare Important Message Given:  Yes     Shelda Altes 10/07/2019, 1:01 PM

## 2019-10-07 NOTE — TOC Progression Note (Signed)
Transition of Care [facility]) - Progression Note    Patient Details  Name: [name] MRN: [id number] Date of Birth: 31-Aug-1933  Transition of Care [facility]) CM/SW [place], [place] Phone Number: 10/07/2019, 2:35 PM  Clinical Narrative:     [facility] has arranged for the patient to discharge home tomorrow. Patient will receive caregiver from 10am-10pm. They will provide OT/PT for the patient in the home.   CSW spoke with the patient- she is in agreement with the discharge plan.   Patient will need PTAR.  Thurmond Butts, MSW, [place] Clinical Social Worker   Expected Discharge Plan: [place]    Expected Discharge Plan and Services Expected Discharge Plan: [place] In-house Referral: Clinical Social Work     Living arrangements for the past 2 months: Independent Living Facility([facility])                                       Social Determinants of Health (SDOH) Interventions    Readmission Risk Interventions No flowsheet data found.

## 2019-10-07 NOTE — Progress Notes (Signed)
Patient feels pretty good today.  Hemoglobin up to 9.2.  Continue observation.

## 2019-10-07 NOTE — Progress Notes (Signed)
Cardiac monitoring orders were not discontinued. Placed pt back on tele this evening. She did not wish to keep tele monitor on. This RN explained to her that she is progressive care and she received blood today so it would be advisable to keep it on. Pt wished to turn the tele off due to the noise. Paged Baltazar Najjar for orders and placed tele on standby for now. Will continue to monitor.   Fransico Michael, RN

## 2019-10-07 NOTE — Progress Notes (Signed)
Hospitalist progress note   RAHF ROSDAHL [id number] DOB: 09-08-1933 DOA: 10/03/2019  PCP: Gayland Curry, DO   Narrative:  86 year old white female [facility] resident P A. fib Mali score >4 on anticoagulation, prior GI bleed 2003, migraines, cataract, BMI >32, multiple pulmonary nodules, thoracic aortic aneurysm without rupture HTN, depression and anxiety, alcohol habituation Seen at PCP office 1/6 found clinically with pallor and also with dark brown stools Recheck of hemoglobin showed it was down from 13 previously to about 7.5  Patient was admitted-Eliquis stopped ibuprofen stop patient was transfused 2 units PRBC GI has consulted Had scope transfused a couple of times but now needing SNF   Data Reviewed:  BUN/creatinine down from 35/1.10--> 16/0.99 LFTs normal Hemoglobin 5.4---2 units PRBC->7.2 -->6.6 WBC 1.97 ANC 900  platelet 134--121,  EGD 10/05/19 showed single angiodysplastic lesion in stom s/p APC  Assessment & Plan: Acute GI bleed secondary to Eliquis, ibuprofen Advance diet per GI as tol-eliquis holding per GI--cont PPI per gi and duration Hemoglobin stable P A. fib Mali score >4 continue Tambocor 50 twice daily, Bystolic 10 daily Diverticulosis, prior GI bleed 2003 Unclear circumstances of prior bleed-monitor trends Relative pancytopenia Etiology potentially 2/2 ETOH vs Allopurinol suppression ? good candidate for anticoagulation All counts are better-appreciate Onc input--no further work up--OP possible follow ip EtOH habituation -tells me she drinks 2 ETOH daily for the past several years-unwilling to change her habits HTN Holding Ava pro 75 for now, Lasix 60 daily-see above Resume if prn lasix as OP per SNF MD Gout Continue allopurinol 100 daily--might need dose adjustemnt Insomnia Depression anxiety Patient during hospital stay can resume Ambien 5 at bedtime melatonin 5 HF Pulmonary nodules Does need outpatient characterization of the same  SCD,  Not ready for d/c-now needing SNF Need ? level of care  Subjective:  Still weak Need SNF per PT No CP No SOB some transient confusion at nights  Consultants:   GI Procedures:   None yet Antimicrobials:   None  Objective: Vitals:   10/07/19 0425 10/07/19 0735 10/07/19 1000 10/07/19 1202  BP: (!) 161/52 (!) 158/58 (!) 155/78 (!) 108/49  Pulse: 80 72 (!) 34   Resp: 19 20 12    Temp: 98.4 F (36.9 C) 99 F (37.2 C)  98.8 F (37.1 C)  TempSrc: Oral Oral  Oral  SpO2: 96% 98% 100% 97%  Weight:      Height:        Intake/Output Summary (Last 24 hours) at 10/07/2019 1613 Last data filed at 10/06/2019 2200 Gross per 24 hour  Intake 621.15 ml  Output --  Net 621.15 ml   Filed Weights   10/05/19 0421 10/06/19 0436 10/07/19 0423  Weight: 85.5 kg 84.6 kg 86.3 kg    Examination:  no focal deficit No pallor  No ict abd soft no HSM--no Splenomegaly  no Le edema  ROm intact mood euthymic  Scheduled Meds: . sodium chloride   Intravenous Once  . sodium chloride   Intravenous Once  . acetaminophen  650 mg Oral Once  . allopurinol  100 mg Oral Daily  . diphenhydrAMINE  25 mg Oral Once  . docusate sodium  100 mg Oral Daily  . flecainide  50 mg Oral BID  . Melatonin  4.5 mg Oral QHS  . nebivolol  10 mg Oral Daily  . zolpidem  5 mg Oral QHS   Continuous Infusions: . famotidine (PEPCID) IV Stopped (10/07/19 1158)     LOS: 4 days  Time spent: [place], MD [facility]  10/07/2019, 4:13 PM

## 2019-10-07 NOTE — TOC Initial Note (Signed)
Transition of Care [facility]) - Initial/Assessment Note    Patient Details  Name: [name] MRN: [id number] Date of Birth: 1932-12-29  Transition of Care [facility]) CM/SW Contact:    Vinie Sill, [place] Phone Number: 10/07/2019, 2:06 PM  Clinical Narrative:                   Expected Discharge Plan: [name]   CSW visit with the patient art bedside. CSW introduced self and explained role. Patient confirmed she was from [facility] [place]. CSW discussed PT and OT recommendations. Patient states she believes some rehab would be beneficial. Patient agrees with PT recommendation. Patient states no questions or concerns at this time.   CSW contacted [facility] and provided updated - patient wants to discharge to rehab. [facility] will contact CSW once arrangements has been.  CSW requested covid test. Md updated.  Thurmond Butts, MSW, [place] Clinical Social Worker     Patient Goals and CMS Choice Patient states their goals for this hospitalization and ongoing recovery are:: "get rehab until I am able to get my self togther"      Expected Discharge Plan and Services Expected Discharge Plan: [place] In-house Referral: Clinical Social Work     Living arrangements for the past 2 months: Independent Living Facility([facility])                                      Prior Living Arrangements/Services Living arrangements for the past 2 months: Independent Living Facility([facility]) Lives with:: Self Patient language and need for interpreter reviewed:: No        Need for Family Participation in Patient Care: No (Comment) Care giver support system in place?: Yes (comment)   Criminal Activity/Legal Involvement Pertinent to Current Situation/Hospitalization: No - Comment as needed  Activities of Daily Living Home Assistive Devices/Equipment: Environmental consultant (specify type) ADL Screening (condition at time of admission) Patient's cognitive ability  adequate to safely complete daily activities?: Yes Is the patient deaf or have difficulty hearing?: No Does the patient have difficulty seeing, even when wearing glasses/contacts?: No Does the patient have difficulty concentrating, remembering, or making decisions?: No Patient able to express need for assistance with ADLs?: Yes Does the patient have difficulty dressing or bathing?: No Independently performs ADLs?: Yes (appropriate for developmental age) Does the patient have difficulty walking or climbing stairs?: Yes Weakness of Legs: Both Weakness of Arms/Hands: None  Permission Sought/Granted Permission sought to share information with : Family Supports, Customer service manager, Case Optician, dispensing granted to share information with : Yes, Verbal Permission Granted  Share Information with NAME: Margretta Sidle  Permission granted to share info w AGENCY: [facility]  Permission granted to share info w Relationship: Step daughter  Permission granted to share info w Contact Information: [phone]  Emotional Assessment Appearance:: Appears stated age Attitude/Demeanor/Rapport: Engaged, Self-Confident Affect (typically observed): Accepting, Appropriate Orientation: : Oriented to Self, Oriented to Place, Oriented to  Time, Oriented to Situation Alcohol / Substance Use: Not Applicable Psych Involvement: No (comment)  Admission diagnosis:  GI bleeding [K92.2] Upper GI bleed [K92.2] Elevated INR [R79.1] Symptomatic anemia [D64.9] Patient Active Problem List   Diagnosis Date Noted  . Neutropenia ([place])   . GI bleeding 10/03/2019  . Abnormal CT of the chest 04/19/2019  . Chronic rhinitis 11/30/2018  . Aortic atherosclerosis ([place]) 08/26/2018  . Thoracic aortic aneurysm without rupture ([place]) 08/26/2018  . Viral  pneumonia 08/08/2018  . Chronic diastolic heart failure ([place]) 12/10/2017  . Urinary incontinence, mixed 08/18/2017  . Hypercoagulable state due to atrial fibrillation  ([place]) 08/06/2017  . Major depressive disorder with single episode, in full remission ([place]) 08/06/2017  . Gastroesophageal reflux disease 04/29/2017  . Diarrhea of presumed infectious origin 04/29/2017  . Lumbar paraspinal muscle spasm 05/20/2016  . Pruritus 11/08/2013  . Hx of peptic ulcer 06/13/2013  . Hyperglycemia 06/07/2013  . DJD (degenerative joint disease) 06/07/2013  . Hypokalemia 06/06/2013  . Abnormality of gait 06/02/2013  . Unintentional weight loss 03/29/2013  . Cough, persistent 03/29/2013  . Tremor 01/11/2013  . Nausea 01/11/2013  . Depression   . Alcohol dependence, continuous ([place]) 10/28/2012  . Insomnia 10/27/2012  . Paroxysmal atrial fibrillation (H[facility])   . PVC (premature ventricular contraction)   . Essential hypertension 10/08/2012   PCP:  Gayland Curry, DO Pharmacy:   [facility] [place], [place] [address] [place] [address] Phone: [phone] Fax: [phone]  [facility] [place], [place] - [address] AT [address] [place] [address] Phone: [phone] Fax: [phone]     Social Determinants of Health (SDOH) Interventions    Readmission Risk Interventions No flowsheet data found.

## 2019-10-07 NOTE — Evaluation (Addendum)
Occupational Therapy Evaluation Patient Details Name: [name] MRN: [id number] DOB: Dec 26, 1932 Today's Date: 10/07/2019    History of Present Illness 86 year old female [facility] facility resident P A. fib [place] score >4 on anticoagulation, prior GI bleed 2003, migraines, cataract, BMI >32, multiple pulmonary nodules, thoracic aortic aneurysm without rupture HTN, depression and anxiety, alcohol habituation. Pt admitted for GIB.   Clinical Impression   Patient is an 86 year old female that resides at retirement community in duplex that is all one level. Patient reports at baseline she is mostly modified independent with self care, has supervision for bathing and CNA for 5hr/7 days a week who assists with IADLs such as bringing her meals, laundry, transportation, supervision with bathing. Patient reports at baseline she is limited with mobility due to arthritic knees and uses a rolling walker in her house. Currently patient in min guard for functional transfer to bedside chair, supervision for lower body dressing from seated position. Patient reports feeling lower energy than baseline and that if can increase the hours of assist from CNA as needed and is agreeable to home health services. Will continue to follow with acute OT services to maximize patient safety and independence with self care.   Original order for OT splint, chat texted with Dr. Verlon Au and clarified that order should have been for eval/ treat not splint.     Follow Up Recommendations  Home health OT;Supervision - Intermittent    Equipment Recommendations  None recommended by OT       Precautions / Restrictions Precautions Precautions: Fall Restrictions Weight Bearing Restrictions: No      Mobility Bed Mobility Overal bed mobility: Needs Assistance Bed Mobility: Supine to Sit     Supine to sit: Supervision Sit to supine: Supervision      Transfers Overall transfer level: Needs assistance Equipment used:  Rolling walker (2 wheeled) Transfers: Sit to/from Stand Sit to Stand: Min guard              Balance Overall balance assessment: Needs assistance Sitting-balance support: No upper extremity supported;Feet supported Sitting balance-Leahy Scale: Good Sitting balance - Comments: modI   Standing balance support: Bilateral upper extremity supported;During functional activity Standing balance-Leahy Scale: Poor Standing balance comment: reliant on external support                           ADL either performed or assessed with clinical judgement   ADL Overall ADL's : Needs assistance/impaired Eating/Feeding: Independent;Sitting   Grooming: Set up;Sitting   Upper Body Bathing: Set up;Sitting   Lower Body Bathing: Supervison/ safety;Min guard;Sitting/lateral leans;Sit to/from stand   Upper Body Dressing : Set up;Sitting   Lower Body Dressing: Supervision/safety;Min guard;Sitting/lateral leans;Sit to/from stand Lower Body Dressing Details (indicate cue type and reason): seated in chair patient supervision for doff/don socks, min guard in standing for safety Toilet Transfer: Min guard;BSC;RW;Ambulation Toilet Transfer Details (indicate cue type and reason): simulated with chair transfer, min guard for safety/managing lines Toileting- Clothing Manipulation and Hygiene: Min guard;Sitting/lateral lean;Sit to/from stand       Functional mobility during ADLs: Min guard;Rolling walker;Cueing for safety General ADL Comments: patient reports feeling below her baseline/low energy. does not require physical assist for mobility, transfers     Vision Baseline Vision/History: Wears glasses Wears Glasses: At all times              Pertinent Vitals/Pain Pain Assessment: No/denies pain     Hand Dominance Right  Extremity/Trunk Assessment Upper Extremity Assessment Upper Extremity Assessment: Generalized weakness   Lower Extremity Assessment Lower Extremity Assessment:  Defer to PT evaluation   Cervical / Trunk Assessment Cervical / Trunk Assessment: Kyphotic   Communication Communication Communication: No difficulties   Cognition Arousal/Alertness: Awake/alert Behavior During Therapy: WFL for tasks assessed/performed Overall Cognitive Status: Within Functional Limits for tasks assessed                                     General Comments  VSS            Home Living Family/patient expects to be discharged to:: Other (Comment)                                 Additional Comments: pt lives at [facility] in independent living, has aide for 5 hrs/day that provide assist for meals, laundry, supervision for bathing.      Prior Functioning/Environment Level of Independence: Independent with assistive device(s)  Gait / Transfers Assistance Needed: pt is modI with RW in the home, utilizes wheelchair which someone pushes her in for community mobility ADL's / Homemaking Assistance Needed: Supervision for bathing   Comments: meals delivered, does not drive        OT Problem List: Decreased strength;Decreased activity tolerance;Impaired balance (sitting and/or standing);Decreased safety awareness      OT Treatment/Interventions: Self-care/ADL training;Therapeutic exercise;Energy conservation;DME and/or AE instruction;Therapeutic activities;Patient/family education;Balance training    OT Goals(Current goals can be found in the care plan section) Acute Rehab OT Goals Patient Stated Goal: to get back to baseline OT Goal Formulation: With patient Time For Goal Achievement: 10/21/19 Potential to Achieve Goals: Good  OT Frequency: Min 2X/week    AM-PAC OT "6 Clicks" Daily Activity     Outcome Measure Help from another person eating meals?: None Help from another person taking care of personal grooming?: A Little Help from another person toileting, which includes using toliet, bedpan, or urinal?: A Little Help from  another person bathing (including washing, rinsing, drying)?: A Little Help from another person to put on and taking off regular upper body clothing?: A Little Help from another person to put on and taking off regular lower body clothing?: A Little 6 Click Score: 19   End of Session Equipment Utilized During Treatment: Rolling walker Nurse Communication: Mobility status  Activity Tolerance: Patient tolerated treatment well Patient left: in chair;with call bell/phone within reach  OT Visit Diagnosis: Unsteadiness on feet (R26.81);Muscle weakness (generalized) (M62.81);Other abnormalities of gait and mobility (R26.89)                Time: [id number] OT Time Calculation (min): 18 min Charges:  OT General Charges $OT Visit: 1 Visit OT Evaluation $OT Eval Moderate Complexity: [place] OT OT office: [place] 10/07/2019, 12:50 PM

## 2019-10-08 ENCOUNTER — Telehealth: Payer: Self-pay | Admitting: *Deleted

## 2019-10-08 LAB — CBC
HCT: 29.8 % — ABNORMAL LOW (ref 36.0–46.0)
Hemoglobin: 9.6 g/dL — ABNORMAL LOW (ref 12.0–15.0)
MCH: 30 pg (ref 26.0–34.0)
MCHC: 32.2 g/dL (ref 30.0–36.0)
MCV: 93.1 fL (ref 80.0–100.0)
Platelets: 120 10*3/uL — ABNORMAL LOW (ref 150–400)
RBC: 3.2 MIL/uL — ABNORMAL LOW (ref 3.87–5.11)
RDW: 17.9 % — ABNORMAL HIGH (ref 11.5–15.5)
WBC: 2 10*3/uL — ABNORMAL LOW (ref 4.0–10.5)
nRBC: 1.5 % — ABNORMAL HIGH (ref 0.0–0.2)

## 2019-10-08 LAB — HCV RNA QUANT RFLX ULTRA OR GENOTYP

## 2019-10-08 MED ORDER — RIVAROXABAN (XARELTO) VTE STARTER PACK (15 & 20 MG): 5.0000 mg | ORAL_TABLET | Freq: Two times a day (BID) | ORAL | 0 refills | Status: DC

## 2019-10-08 MED ORDER — PANTOPRAZOLE SODIUM 40 MG PO TBEC: 40.0000 mg | DELAYED_RELEASE_TABLET | Freq: Two times a day (BID) | ORAL | 11 refills | Status: DC

## 2019-10-08 MED ORDER — ALUM & MAG HYDROXIDE-SIMETH 200-200-20 MG/5ML PO SUSP
30.0000 mL | Freq: Four times a day (QID) | ORAL | Status: DC | PRN
  Administered 2019-10-08 (×2): 30 mL via ORAL
  Filled 2019-10-08 (×2): qty 30

## 2019-10-08 MED ORDER — PANTOPRAZOLE SODIUM 40 MG PO TBEC: 40.0000 mg | DELAYED_RELEASE_TABLET | Freq: Two times a day (BID) | ORAL | 1 refills | Status: DC

## 2019-10-08 NOTE — Telephone Encounter (Signed)
Martha Curry, DO  [name], [name], [name],  [name] needs a TOC. Caregiver is with her only from 10--3 so appts have to be during that time frame.   Thanks.     I have made the 1st attempt to contact the patient or family member in charge, in order to follow up from recently being discharged from the hospital. I left a message on voicemail but I will make another attempt at a different time.

## 2019-10-08 NOTE — Progress Notes (Signed)
Pt complained mild headache, Tylenol given. Her BP 191/66 mmHg. Notified on- call provider, Jeannette Corpus, NP. Order received, Hydralazine 5 mg IV given, then her BP 138/58 mmHg, HR 60s-70s, with regular rhythm.    10/07/19 2148  Vitals  Temp 98 F (36.7 C)  Temp Source Oral  BP (!) 191/66  MAP (mmHg) 101  BP Location Left Arm  BP Method Automatic  Patient Position (if appropriate) Lying  Pulse Rate 69  Pulse Rate Source Monitor  Resp 18  Level of Consciousness  Level of Consciousness Alert  Oxygen Therapy  SpO2 99 %  O2 Device Room Air   Continue to monitor.  Kennyth Lose, RN

## 2019-10-08 NOTE — Progress Notes (Signed)
The patient is doing well today.  No signs of bleeding.  She is eating.  Hemoglobin stable.  I think she is stable to go home.  I would keep her on acid suppression in the form of a PPI or H2 blocker.  I think she can resume her Eliquis in a couple of days.  We will sign off.  Call us if needed.

## 2019-10-08 NOTE — Progress Notes (Signed)
D/C instructions given to patient. Medications reviewed. All questions answered. IV removed, clean and intact. PTAR to escort pt home.  Clyde Canterbury, RN

## 2019-10-08 NOTE — Discharge Summary (Signed)
Physician Discharge Summary  KEYLY DIA [id number] DOB: November 04, 1932 DOA: 10/03/2019  PCP: Gayland Curry, DO  Admit date: 10/03/2019 Discharge date: 10/08/2019  Time spent: 45 minutes  Recommendations for Outpatient Follow-up:  1. Should resume Xarelto 5 mg twice daily on 1/18 and monitor hemoglobin probably in the next several days subsequent to that on 1/21 to ensure not dropping hemoglobin 2. May need referral to hematology if persistent pancytopenia is noted on repeat CBC consider resumption afebrile 3. Consider resumption Lasix for as needed fluid seems to be gaining fluid in the outpatient-these were held from hospital stay Discharge Diagnoses:  Principal Problem:   GI bleeding Active Problems:   Essential hypertension   Paroxysmal atrial fibrillation (HCC)   Hx of peptic ulcer   Neutropenia ([facility])   Discharge Condition: good  Diet recommendation: regular  Filed Weights   10/06/19 0436 10/07/19 0423 10/08/19 0500  Weight: 84.6 kg 86.3 kg 86 kg    History of present illness:  86 year old white female [facility] resident P A. fib Mali score >4 on anticoagulation, prior GI bleed 2003, migraines, cataract, BMI >32, multiple pulmonary nodules, thoracic aortic aneurysm without rupture HTN, depression and anxiety, alcohol habituation Seen at PCP office 1/6 found clinically with pallor and also with dark brown stools Recheck of hemoglobin showed it was down from 13 previously to about 7.5  Patient was admitted-Eliquis stopped ibuprofen stop patient was transfused 2 units [facility] GI has consulted Had scope transfused a couple of times but now needing SNF  Hospital Course:  Acute GI bleed secondary to Eliquis, ibuprofen Advance diet per GI as tol-eliquis holding per GI- " for couple days" so would resume may be on 1/18-on discharge placing on Protonix for at least 2 months and then can possibly be discontinued P A. fib Mali score >4 continue Tambocor 50 twice daily,  Bystolic 10 daily Diverticulosis, prior GI bleed 2003 Unclear circumstances of prior bleed-monitor trends Relative pancytopenia Etiology potentially 2/2 ETOH vs Allopurinol suppression ? good candidate for anticoagulation All counts are better-appreciate Onc input--no further work up--OP possible follow ip EtOH habituation -tells me she drinks 2 ETOH daily for the past several years-unwilling to change her habits HTN Holding Ava pro 75 for now, Lasix 60 daily-see above Resume if prn lasix as OP per SNF MD Gout Continue allopurinol 100 daily--might need dose adjustemnt Insomnia Depression anxiety Patient during hospital stay can resume Ambien 5 at bedtime melatonin 5 HF Pulmonary nodules Does need outpatient characterization of the same  Procedures: EGD 10/05/19 showed single angiodysplastic lesion in stom s/p APC  Discharge Exam: Vitals:   10/08/19 0451 10/08/19 0807  BP: (!) 150/53 (!) 139/55  Pulse: 73 73  Resp: 18   Temp: 98.9 F (37.2 C) 98.7 F (37.1 C)  SpO2: 93% 96%    General: Doing well sitting up in no distress-awake coherent No pallor no icterus Some abdominal discomfort this morning relieved with Maalox and was told this is expected  EOMI NCAT thick neck S1-S2 no murmur rub or gallop abdomen soft no rebound No lower extremity edema RRR on exam telemetry no A. fib Neurologically intact and seems a little stronger than yesterday   Discharge Instructions   Discharge Instructions    Diet - low sodium heart healthy   Complete by: As directed    Discharge instructions   Complete by: As directed    Do not take Xarelto until 1/18 and if you experience any red stool report this you will notice  some your blood pressure medications as well as some of your diuretics have been discontinued for the time being Dr. Mariea Clonts likely can reevaluate whether you need these in the outpatient setting I expect he will be back up to be able to do things over the next several  days under the guidance of therapy and you will get extra therapy at your facility We would recommend that you get labs in about a week Good luck with your taxes and take care of yourself   Increase activity slowly   Complete by: As directed    Increase activity slowly   Complete by: As directed      Allergies as of 10/08/2019      Reactions   Mirtazapine Other (See Comments)   tremor   Nickel Other (See Comments)   inflammation   Other Itching   Acrylic nails, and trace metals   Prednisone Other (See Comments)   Causes sleep disturbances       Medication List    STOP taking these medications   apixaban 5 MG Tabs tablet Commonly known as: Eliquis   docusate sodium 100 MG capsule Commonly known as: Colace   furosemide 20 MG tablet Commonly known as: LASIX   irbesartan 75 MG tablet Commonly known as: AVAPRO   NEO-SYNEPHRINE NA   ondansetron 4 MG tablet Commonly known as: ZOFRAN   potassium chloride SA 20 MEQ tablet Commonly known as: KLOR-CON   UNABLE TO FIND   vitamin C 100 MG tablet   Vitamin D3 250 MCG (10000 UT) capsule     TAKE these medications   acetaminophen 500 MG tablet Commonly known as: TYLENOL Take 750-1,000 mg by mouth See admin instructions. Take 2 tablets (1000 mg) by mouth every morning and 1 1/2 tablets (750 mg) at night   allopurinol 100 MG tablet Commonly known as: ZYLOPRIM Take 1 tablet (100 mg total) by mouth daily.   flecainide 50 MG tablet Commonly known as: TAMBOCOR TAKE 1 TABLET BY MOUTH TWICE DAILY FOR ATRIAL FIBRILLATION What changed:   how much to take  how to take this  when to take this  additional instructions   Melatonin 5 MG Tabs Take 5 mg by mouth at bedtime.   nebivolol 10 MG tablet Commonly known as: Bystolic Take 1 tablet (10 mg total) by mouth daily.   Rivaroxaban 15 & 20 MG Tbpk Take 5 mg by mouth 2 (two) times daily. Follow package directions: Take one 15mg  tablet by mouth twice a day. On day 22,  switch to one 20mg  tablet once a day. Take with food. Start taking on: October 11, 2019   zolpidem 5 MG tablet Commonly known as: AMBIEN Take 1 tablet (5 mg total) by mouth at bedtime.      Allergies  Allergen Reactions  . Mirtazapine Other (See Comments)    tremor  . Nickel Other (See Comments)    inflammation  . Other Itching    Acrylic nails, and trace metals  . Prednisone Other (See Comments)    Causes sleep disturbances       The results of significant diagnostics from this hospitalization (including imaging, microbiology, ancillary and laboratory) are listed below for reference.    Significant Diagnostic Studies: CT ABDOMEN PELVIS WO CONTRAST  Result Date: 10/03/2019 CLINICAL DATA:  86 year old female with dark tarry stools suggesting GI bleed. Epigastric pain. EXAM: CT ABDOMEN AND PELVIS WITHOUT CONTRAST TECHNIQUE: Multidetector CT imaging of the abdomen and pelvis was performed following the standard  protocol without IV contrast. COMPARISON:  CT chest 04/08/2019. CT Abdomen and Pelvis 05/09/2016 and earlier. FINDINGS: Lower chest: Stable cardiomegaly since 2017. No pericardial or pleural effusion. Mild chronic lung base scarring is stable. Hepatobiliary: Surgically absent gallbladder. Negative noncontrast liver. Pancreas: Chronic fatty atrophy of the pancreas with no mass or inflammation identified. Spleen: Borderline to mild splenomegaly is stable since 2017. Adrenals/Urinary Tract: Normal adrenal glands. Negative noncontrast kidneys. Negative ureters. Unremarkable urinary bladder. No urinary calculus. Stomach/Bowel: The rectum appears to remain normal despite increased presacral stranding since 2017. Redundant sigmoid colon with mild diverticulosis and no active inflammation. Similar appearance of the descending colon. There is some fluid at the splenic flexure and in the transverse colon. Fluid-filled but otherwise negative right colon. Normal appendix on series 3, image 67  tracks to the right pelvic side wall. Decompressed and negative terminal ileum. No dilated small bowel. Negative noncontrast CT appearance of the stomach. There is a 3 centimeter duodenal diverticulum at the junction of the 2nd and 3rd portions (series 6, image 37), but otherwise negative duodenum. No mesenteric stranding identified. Vascular/Lymphatic: Aortoiliac calcified atherosclerosis. Vascular patency is not evaluated in the absence of IV contrast. Reproductive: Negative noncontrast appearance. Other: There is presacral stranding (series 3, image 70) which has mildly increased from 2017. No pelvic free fluid. Musculoskeletal: No acute osseous abnormality identified. IMPRESSION: 1. No acute or inflammatory process identified in the noncontrast Abdomen. A 3 cm diverticulum of the duodenum is chronic and these are generally asymptomatic. 2. No acute finding in the noncontrast Pelvis; nonspecific presacral stranding has mildly increased since 2017 but the nearby rectum remains within normal limits. 3. Aortic Atherosclerosis (ICD10-I70.0). Electronically Signed   By: Genevie Ann M.D.   On: 10/03/2019 22:22   DG Chest Portable 1 View  Result Date: 10/03/2019 CLINICAL DATA:  Fatigue, GI bleed EXAM: PORTABLE CHEST 1 VIEW COMPARISON:  08/08/2018 chest radiograph. FINDINGS: Stable cardiomediastinal silhouette with top-normal heart size and tortuous atherosclerotic thoracic aorta. No pneumothorax. No pleural effusion. Lungs appear clear, with no acute consolidative airspace disease and no pulmonary edema. IMPRESSION: No active disease. Electronically Signed   By: Ilona Sorrel M.D.   On: 10/03/2019 18:19   ECHOCARDIOGRAM COMPLETE  Result Date: 09/22/2019   ECHOCARDIOGRAM REPORT   Patient Name:   GEORGANNA MCCLAVE Date of Exam: 09/22/2019 Medical Rec #:  [id number]     Height:       64.0 in Accession #:    [id number]    Weight:       199.0 lb Date of Birth:  09-16-1933     BSA:          1.95 m Patient Age:    24 years       BP:           137/79 mmHg Patient Gender: F             HR:           68 bpm. Exam Location:  [facility] Procedure: 2D Echo, Cardiac Doppler and Color Doppler Indications:    I50.33 Acute on chronic diastolic (congestive) heart failure  History:        Patient has prior history of Echocardiogram examinations, most                 recent 02/28/2017. Arrythmias:Atrial Fibrillation and PVC,                 Signs/Symptoms:Shortness of Breath; Risk Factors:Hypertension.  Edema. Feet.  Sonographer:    Diamond Nickel RCS Referring Phys: [id number] Abigail Butts  Sonographer Comments: Technically difficult due to patient's discomfort while scanning in the apical view. IMPRESSIONS  1. Left ventricular ejection fraction, by visual estimation, is 60 to 65%. The left ventricle has normal function. There is moderately increased left ventricular hypertrophy.  2. Elevated left ventricular end-diastolic pressure.  3. Left ventricular diastolic parameters are consistent with Grade II diastolic dysfunction (pseudonormalization).  4. The left ventricle has no regional wall motion abnormalities.  5. Global right ventricle has normal systolic function.The right ventricular size is normal. No increase in right ventricular wall thickness.  6. Left atrial size was moderately dilated.  7. Right atrial size was normal.  8. The mitral valve is normal in structure. Trivial mitral valve regurgitation. No evidence of mitral stenosis.  9. The tricuspid valve is normal in structure. 10. The aortic valve is normal in structure. Aortic valve regurgitation is mild. Mild to moderate aortic valve sclerosis/calcification without any evidence of aortic stenosis. 11. The pulmonic valve was normal in structure. Pulmonic valve regurgitation is not visualized. 12. Aneurysm of the ascending aorta. 13. There is mild dilatation of the ascending aorta measuring 41 mm. 14. The inferior vena cava is normal in size with greater than 50% respiratory  variability, suggesting right atrial pressure of 3 mmHg. FINDINGS  Left Ventricle: Left ventricular ejection fraction, by visual estimation, is 60 to 65%. The left ventricle has normal function. The left ventricle has no regional wall motion abnormalities. There is moderately increased left ventricular hypertrophy. Concentric left ventricular hypertrophy. Left ventricular diastolic parameters are consistent with Grade II diastolic dysfunction (pseudonormalization). Elevated left ventricular end-diastolic pressure. Right Ventricle: The right ventricular size is normal. No increase in right ventricular wall thickness. Global RV systolic function is has normal systolic function. Left Atrium: Left atrial size was moderately dilated. Right Atrium: Right atrial size was normal in size Pericardium: There is no evidence of pericardial effusion. Mitral Valve: The mitral valve is normal in structure. Trivial mitral valve regurgitation. No evidence of mitral valve stenosis by observation. Tricuspid Valve: The tricuspid valve is normal in structure. Tricuspid valve regurgitation is mild. Aortic Valve: The aortic valve is normal in structure. Aortic valve regurgitation is mild. Mild to moderate aortic valve sclerosis/calcification is present, without any evidence of aortic stenosis. Pulmonic Valve: The pulmonic valve was normal in structure. Pulmonic valve regurgitation is not visualized. Pulmonic regurgitation is not visualized. Aorta: The aortic root, ascending aorta and aortic arch are all structurally normal, with no evidence of dilitation or obstruction. There is mild dilatation of the ascending aorta measuring 41 mm. There is an aneurysm involving the ascending aorta. Venous: The inferior vena cava is normal in size with greater than 50% respiratory variability, suggesting right atrial pressure of 3 mmHg. IAS/Shunts: No atrial level shunt detected by color flow Doppler. There is no evidence of a patent foramen ovale. No  ventricular septal defect is seen or detected. There is no evidence of an atrial septal defect.  LEFT VENTRICLE PLAX 2D LVIDd:         3.40 cm  Diastology LVIDs:         2.50 cm  LV e' lateral:   11.20 cm/s LV PW:         1.60 cm  LV E/e' lateral: 11.0 LV IVS:        1.40 cm  LV e' medial:    6.64 cm/s LVOT diam:  2.00 cm  LV E/e' medial:  18.5 LV SV:         25 ml LV SV Index:   12.21 LVOT Area:     3.14 cm  RIGHT VENTRICLE RV Basal diam:  2.10 cm LEFT ATRIUM           Index LA diam:      4.70 cm 2.41 cm/m LA Vol (A2C): 58.4 ml 29.92 ml/m LA Vol (A4C): 69.2 ml 35.45 ml/m  AORTIC VALVE LVOT Vmax:   158.00 cm/s LVOT Vmean:  115.000 cm/s LVOT VTI:    3.590 m MV E velocity: 123.00 cm/s 103 cm/s MV A velocity: 952.00 cm/s 70.3 cm/s SHUNTS MV E/A ratio:  0.13        1.5       Systemic VTI:  3.59 m                                      Systemic Diam: 2.00 cm  Ena Dawley MD Electronically signed by Ena Dawley MD Signature Date/Time: 09/22/2019/2:42:32 PM    Final     Microbiology: Recent Results (from the past 240 hour(s))  Respiratory Panel by RT PCR (Flu A&B, Covid) - Nasopharyngeal Swab     Status: None   Collection Time: 10/03/19  7:04 PM   Specimen: Nasopharyngeal Swab  Result Value Ref Range Status   SARS Coronavirus 2 by RT PCR NEGATIVE NEGATIVE Final    Comment: (NOTE) SARS-CoV-2 target nucleic acids are NOT DETECTED. The SARS-CoV-2 RNA is generally detectable in upper respiratoy specimens during the acute phase of infection. The lowest concentration of SARS-CoV-2 viral copies this assay can detect is 131 copies/mL. A negative result does not preclude SARS-Cov-2 infection and should not be used as the sole basis for treatment or other patient management decisions. A negative result may occur with  improper specimen collection/handling, submission of specimen other than nasopharyngeal swab, presence of viral mutation(s) within the areas targeted by this assay, and inadequate  number of viral copies (<131 copies/mL). A negative result must be combined with clinical observations, patient history, and epidemiological information. The expected result is Negative. Fact Sheet for Patients:  PinkCheek.be Fact Sheet for Healthcare Providers:  GravelBags.it This test is not yet ap proved or cleared by the [place] FDA and  has been authorized for detection and/or diagnosis of SARS-CoV-2 by FDA under an Emergency Use Authorization (EUA). This EUA will remain  in effect (meaning this test can be used) for the duration of the COVID-19 declaration under Section 564(b)(1) of the Act, 21 U.S.C. section 360bbb-3(b)(1), unless the authorization is terminated or revoked sooner.    Influenza A by PCR NEGATIVE NEGATIVE Final   Influenza B by PCR NEGATIVE NEGATIVE Final    Comment: (NOTE) The Xpert Xpress SARS-CoV-2/FLU/RSV assay is intended as an aid in  the diagnosis of influenza from Nasopharyngeal swab specimens and  should not be used as a sole basis for treatment. Nasal washings and  aspirates are unacceptable for Xpert Xpress SARS-CoV-2/FLU/RSV  testing. Fact Sheet for Patients: PinkCheek.be Fact Sheet for Healthcare Providers: GravelBags.it This test is not yet approved or cleared by the [place] FDA and  has been authorized for detection and/or diagnosis of SARS-CoV-2 by  FDA under an Emergency Use Authorization (EUA). This EUA will remain  in effect (meaning this test can be used) for the duration of the  Covid-19 declaration  under Section 564(b)(1) of the Act, 21  U.S.C. section 360bbb-3(b)(1), unless the authorization is  terminated or revoked. Performed at [facility], [place] [address], [place], [place] [address]   SARS CORONAVIRUS 2 (TAT 6-24 HRS) Nasopharyngeal Nasopharyngeal Swab     Status: None   Collection Time: 10/07/19   2:20 PM   Specimen: Nasopharyngeal Swab  Result Value Ref Range Status   SARS Coronavirus 2 NEGATIVE NEGATIVE Final    Comment: (NOTE) SARS-CoV-2 target nucleic acids are NOT DETECTED. The SARS-CoV-2 RNA is generally detectable in upper and lower respiratory specimens during the acute phase of infection. Negative results do not preclude SARS-CoV-2 infection, do not rule out co-infections with other pathogens, and should not be used as the sole basis for treatment or other patient management decisions. Negative results must be combined with clinical observations, patient history, and epidemiological information. The expected result is Negative. Fact Sheet for Patients: SugarRoll.be Fact Sheet for Healthcare Providers: https://www.woods-mathews.com/ This test is not yet approved or cleared by the [place] FDA and  has been authorized for detection and/or diagnosis of SARS-CoV-2 by FDA under an Emergency Use Authorization (EUA). This EUA will remain  in effect (meaning this test can be used) for the duration of the COVID-19 declaration under Section 56 4(b)(1) of the Act, 21 U.S.C. section 360bbb-3(b)(1), unless the authorization is terminated or revoked sooner. Performed at [facility], [place] [address] [place], [place] [address]      Labs: Basic Metabolic Panel: Recent Labs  Lab 10/03/19 1756 10/03/19 1756 10/03/19 1809 10/04/19 0550 10/05/19 0317 10/05/19 1005 10/07/19 0237  NA 137   < > 136 139 138 137 136  K 3.8   < > 3.9 3.8 3.5 3.8 3.6  CL 97*   < > 96* 100 101 101 99  CO2 27  --   --  29 27 27 27   GLUCOSE 160*   < > 154* 133* 134* 125* 109*  BUN 35*   < > 36* 28* 16 14 9   CREATININE 1.07*   < > 1.10* 1.08* 0.99 0.91 0.99  CALCIUM 8.4*  --   --  8.5* 8.4* 8.4* 8.3*   < > = values in this interval not displayed.   Liver Function Tests: Recent Labs  Lab 10/03/19 1756 10/04/19 0550 10/05/19 1005 10/07/19 0237   AST 14* 11* 13* 13*  ALT 11 10 11 11   ALKPHOS 58 55 52 57  BILITOT 1.0 1.0 1.0 1.1  PROT 5.4* 5.2* 5.1* 5.1*  ALBUMIN 3.1* 3.1* 3.0* 2.9*   Recent Labs  Lab 10/03/19 1756  LIPASE 19   No results for input(s): AMMONIA in the last 168 hours. CBC: Recent Labs  Lab 10/03/19 1756 10/03/19 1809 10/04/19 0550 10/05/19 0317 10/06/19 0257 10/07/19 0237 10/08/19 0910  WBC 2.7*   < > 2.3* 1.9* 1.7* 2.2* 2.0*  NEUTROABS 1.8  --   --  1.0* 0.9* 0.9*  --   HGB 4.9*   < > 7.1* 7.2* 6.6* 9.2* 9.6*  HCT 16.4*   < > 21.8* 22.8* 21.1* 27.6* 29.8*  MCV 99.4   < > 92.0 95.4 96.8 91.1 93.1  PLT 159   < > 134* 121* 128* 119* 120*   < > = values in this interval not displayed.   Cardiac Enzymes: No results for input(s): CKTOTAL, CKMB, CKMBINDEX, TROPONINI in the last 168 hours. BNP: BNP (last 3 results) Recent Labs    09/22/19 1436  BNP  397.9*    ProBNP (last 3 results) No results for input(s): PROBNP in the last 8760 hours.  CBG: No results for input(s): GLUCAP in the last 168 hours.     Signed:  Nita Sells MD   [facility] 10/08/2019, 11:56 AM

## 2019-10-11 ENCOUNTER — Encounter: Payer: Self-pay | Admitting: Family

## 2019-10-11 MED ORDER — RIVAROXABAN (XARELTO) VTE STARTER PACK (15 & 20 MG): ORAL_TABLET | ORAL | 0 refills | Status: DC

## 2019-10-11 NOTE — Telephone Encounter (Signed)
Scheduled pt for visit at [facility] on 10/12/2018 for TOC. Pt is asking if she should be taking :  Rivaroxaban 15 & 20 MG TBPK [id number]   Order Details Dose: 5 mg Route: Oral Frequency: 2 times daily  Dispense Quantity: 4 each Refills: 0       Sig: Take 5 mg by mouth 2 (two) times daily. Follow package directions: Take one 15mg  tablet by mouth twice a day. On day 22, switch to one 20mg  tablet once a day. Take with food.   Please advise

## 2019-10-11 NOTE — Telephone Encounter (Signed)
Per hospital discharge summary,  Should resume Xarelto 5 mg twice daily on 1/18 and monitor hemoglobin probably in the next several days subsequent to that on 1/21 to ensure not dropping hemoglobin I recommend she resume her xarelto today as directed and that we recheck her cbc on Thursday at [facility].  I will order it at her appt Wednesday there.

## 2019-10-11 NOTE — Telephone Encounter (Signed)
rx sent to pharmacy by e-script Spoke with patient and advised results   

## 2019-10-11 NOTE — Telephone Encounter (Signed)
Transition Care Management Follow-up Telephone Call  Date of discharge and from where: 10/08/2019 [place]  How have you been since you were released from the hospital? Better wants to see Dr. Mariea Clonts  Any questions or concerns? No   Items Reviewed:  Did the pt receive and understand the discharge instructions provided? Yes   Medications obtained and verified? Yes   Any new allergies since your discharge? No   Dietary orders reviewed? Yes  Do you have support at home? Yes   Other (ie: DME, Home Health, etc) Home Aid and PT/OT  Functional Questionnaire: (I = Independent and D = Dependent) ADL's: I with assistance  Bathing/Dressing- I    Meal Prep- D  Eating- I  Maintaining continence- I  Transferring/Ambulation- I with assistance  Managing Meds- I with assistance   Follow up appointments reviewed:    PCP Hospital f/u appt confirmed? Yes  Scheduled to see Dr. Mariea Clonts on 10/13/19 @ 11.  [facility] f/u appt confirmed? No   Are transportation arrangements needed? No   If their condition worsens, is the pt aware to call  their PCP or go to the ED? Yes  Was the patient provided with contact information for the PCP's office or ED? Yes  Was the pt encouraged to call back with questions or concerns? Yes

## 2019-10-11 NOTE — Telephone Encounter (Signed)
I have made the 2nd attempt to contact the patient or family member in charge, in order to follow up from recently being discharged from the hospital. Phone number just rings but I will make another attempt at a different time.

## 2019-10-12 ENCOUNTER — Telehealth: Payer: Self-pay | Admitting: Internal Medicine

## 2019-10-12 DIAGNOSIS — Z9189 Other specified personal risk factors, not elsewhere classified: Secondary | ICD-10-CM | POA: Diagnosis not present

## 2019-10-12 NOTE — Telephone Encounter (Signed)
Yes, I do--she NEEDS the appt.  I'll let [name] know.

## 2019-10-12 NOTE — Telephone Encounter (Signed)
RN with [facility] call to say that pt is on isolation since dc from hospital on 10/10/19. Wants to know if you want to still see her for TOC on 10/13/19?  Please call Mliss Sax to advise Thanks, Lattie Haw

## 2019-10-12 NOTE — Telephone Encounter (Signed)
Patient is scheduled to see Dr. Mariea Clonts at the facility on 10/13/19. She was discharged from the hospital and has no symptoms.

## 2019-10-13 ENCOUNTER — Other Ambulatory Visit: Payer: Self-pay

## 2019-10-13 ENCOUNTER — Encounter: Payer: Self-pay | Admitting: Family

## 2019-10-13 ENCOUNTER — Encounter: Payer: Medicare Other | Admitting: Internal Medicine

## 2019-10-18 DIAGNOSIS — Z20828 Contact with and (suspected) exposure to other viral communicable diseases: Secondary | ICD-10-CM | POA: Diagnosis not present

## 2019-10-18 DIAGNOSIS — Z9189 Other specified personal risk factors, not elsewhere classified: Secondary | ICD-10-CM | POA: Diagnosis not present

## 2019-10-18 NOTE — Progress Notes (Signed)
Cardiology Office Note   Date:  10/25/2019   ID:  [name], DOB Jul 22, 1933, MRN [id number]  PCP:  Gayland Curry, DO  Cardiologist:  Jeanise Durfey [place], MD EP: None  Chief Complaint  Patient presents with  . Follow-up    1 month.      History of Present Illness: [name] is a 86 y.o. female PMH of chronic diastolic CHF, paroxysmal atrial fibrillation, HTN, PVCs, mild-moderate AI, and  diagnosed lung mass c/f malignancy for which she is not undergoing any further work-up/management, who presents for follow-up of her CHF.  She was last evaluated by [place] 09/13/2019, at which time she had complaints of LE edema, SOB, and orthopnea despite low does lasix. She was recommended to increase her lasix to 60mg  daily, monitor weights, and limit salt intake.  Echo i showed EF 60-65%, no RWMA, G2DD, and AV sclerosis and mild AI.Marland Kitchen  She was admitted to the hospital from Jan 10-15 with an upper GI bleed. Dark stools. Hgb dropped to 4.9. she received a total of 6 units of blood. Upper EGD showed a single nonbleeding angioplastic lesion that was cauterized. Lasix and ARB were held. Her Eliquis was held. At DC she was ordered Xarelto but at the PE dose of 15 mg bid.   On follow up today she notes increased edema and some SOB. Lasix was just resumed on Wednesday. She has a very sore mouth. She was previously taking Ibuprofen but has discontinued this.     Past Medical History:  Diagnosis Date  . A-fib ([place])   . Abdominal pain, unspecified site   . Abnormality of gait 06/02/2013  . Arthritis   . Cataracts, bilateral   . Depression   . Diverticulosis of colon (without mention of hemorrhage)   . Diverticulosis of colon (without mention of hemorrhage) 10/27/2012  . Edema 10/27/2012  . GI bleed 06/2002  . Hemorrhage of gastrointestinal tract, unspecified   . History of stomach ulcers   . Hypertension   . Insomnia, unspecified 10/27/2012  . Localized osteoarthrosis not specified whether primary or  secondary, lower leg 05/31/2011  . Migraines   . Nausea alone 10/28/2012  . Obesity, unspecified 10/27/2012  . Osteoarthrosis, unspecified whether generalized or localized, lower leg   . Other acariasis    peripherial neuropathy  . Other and unspecified alcohol dependence, continuous drinking behavior 10/28/2012  . Palpitations   . PVC (premature ventricular contraction)   . Unspecified hereditary and idiopathic peripheral neuropathy   . UTI (lower urinary tract infection)    pt state uti w/ no pain    Past Surgical History:  Procedure Laterality Date  . ARTHROSCOPIC REPAIR ACL    . BREAST BIOPSY  1973   left  . BREAST REDUCTION SURGERY  12/23/2003  . cataracts    . CHOLECYSTECTOMY  2005  . ESOPHAGOGASTRODUODENOSCOPY (EGD) WITH PROPOFOL N/A 10/05/2019   Procedure: ESOPHAGOGASTRODUODENOSCOPY (EGD) WITH PROPOFOL;  Surgeon: Wonda Horner, MD;  Location: [facility] ENDOSCOPY;  Service: Endoscopy;  Laterality: N/A;  . HOT HEMOSTASIS N/A 10/05/2019   Procedure: HOT HEMOSTASIS (ARGON PLASMA COAGULATION/BICAP);  Surgeon: Wonda Horner, MD;  Location: [facility] ENDOSCOPY;  Service: Endoscopy;  Laterality: N/A;  . TONSILLECTOMY AND ADENOIDECTOMY  1941     Current Outpatient Medications  Medication Sig Dispense Refill  . acetaminophen (TYLENOL) 500 MG tablet Take 750-1,000 mg by mouth See admin instructions. Take 2 tablets (1000 mg) by mouth every morning and 1 1/2 tablets (750 mg) at  night    . allopurinol (ZYLOPRIM) 100 MG tablet Take 1 tablet (100 mg total) by mouth daily. 90 tablet 1  . flecainide (TAMBOCOR) 50 MG tablet TAKE 1 TABLET BY MOUTH TWICE DAILY FOR ATRIAL FIBRILLATION 180 tablet 1  . furosemide (LASIX) 40 MG tablet Take 1 tablet (40 mg total) by mouth daily. 30 tablet 3  . lidocaine (XYLOCAINE) 2 % solution Use as directed 15 mLs in the mouth or throat every 4 (four) hours as needed for mouth pain. 100 mL 3  . Melatonin 5 MG TABS Take 5 mg by mouth at bedtime.    . nebivolol (BYSTOLIC) 10 MG tablet  Take 1 tablet (10 mg total) by mouth daily. 90 tablet 1  . ondansetron (ZOFRAN) 4 MG tablet Take 1 tablet (4 mg total) by mouth every 8 (eight) hours as needed for nausea or vomiting. 30 tablet 0  . pantoprazole (PROTONIX) 40 MG tablet Take 1 tablet (40 mg total) by mouth 2 (two) times daily. 60 tablet 11  . zolpidem (AMBIEN) 5 MG tablet Take 1 tablet (5 mg total) by mouth at bedtime. 30 tablet 5  . magic mouthwash SOLN Take 5 ml 4 times a day for 7 days 15 mL 0   No current facility-administered medications for this visit.    Allergies:   Mirtazapine, Nickel, Other, and Prednisone    Social History:  The patient  reports that she quit smoking about 36 years ago. Her smoking use included cigarettes. She has a 30.00 pack-year smoking history. She has never used smokeless tobacco. She reports current alcohol use of about 1.0 - 2.0 standard drinks of alcohol per week. She reports that she does not use drugs.   Family History:  The patient's family history includes Breast cancer in her mother; Hypertension in her father and mother.    ROS:  Please see the history of present illness.   Otherwise, review of systems are positive for none.   All other systems are reviewed and negative.    PHYSICAL EXAM: VS:  BP (!) 116/54   Pulse 74   Temp (!) 97 F (36.1 C)   Ht 5' 7.75" (1.721 m)   Wt 188 lb 12.8 oz (85.6 kg)   LMP  (LMP Unknown)   BMI 28.92 kg/m  , BMI Body mass index is 28.92 kg/m. GEN: Well nourished, well developed, in no acute distress HEENT: sclera anicteric, white thickened plaques of leukoplakia on tongue. Neck: no JVD, carotid bruits, or masses. 1+ edema. Cardiac: RRR; no murmurs, rubs, or gallops,no edema  Respiratory:  clear to auscultation bilaterally, normal work of breathing GI: soft, nontender, nondistended, + BS MS: no deformity or atrophy Skin: warm and dry, no rash Neuro:  Strength and sensation are intact Psych: euthymic mood, full affect   EKG:  EKG is ordered  today. The ekg ordered today demonstrates sinus rhythm with 1st degree AV block, rate 74 bpm, LVH with QRS widening and repolarization abnormality. (LBBB pattern)I have personally reviewed and interpreted this study.    Recent Labs: 09/22/2019: BNP 397.9 10/21/2019: ALT 6; BUN 11; Creat 0.81; Hemoglobin 9.3; Platelets 127; Potassium 3.8; Sodium 139    Lipid Panel    Component Value Date/Time   CHOL 113 09/27/2019 1053   TRIG 129 09/27/2019 1053   HDL 39 (L) 09/27/2019 1053   CHOLHDL 2.9 09/27/2019 1053   LDLCALC 53 09/27/2019 1053      Wt Readings from Last 3 Encounters:  10/25/19 188 lb 12.8  oz (85.6 kg)  10/20/19 192 lb 9.6 oz (87.4 kg)  10/08/19 189 lb 9.5 oz (86 kg)      Other studies Reviewed: Additional studies/ records that were reviewed today include:   Echocardiogram 02/2017: Study Conclusions  - Left ventricle: The cavity size was normal. Wall thickness wasincreased in a pattern of moderate LVH. There was focal basalhypertrophy. Systolic function was normal. The estimated ejectionfraction was in the range of 55% to 60%. Wall motion was normal;there were no regional wall motion abnormalities. Dopplerparameters are consistent with abnormal left ventricularrelaxation (grade 1 diastolic dysfunction). - Aortic valve: Mildly calcified annulus. There was mild to moderate regurgitation. Mitral valve: Valve area by pressure half-time: 1.88 cm^2.  Echo 09/22/19: IMPRESSIONS    1. Left ventricular ejection fraction, by visual estimation, is 60 to 65%. The left ventricle has normal function. There is moderately increased left ventricular hypertrophy.  2. Elevated left ventricular end-diastolic pressure.  3. Left ventricular diastolic parameters are consistent with Grade II diastolic dysfunction (pseudonormalization).  4. The left ventricle has no regional wall motion abnormalities.  5. Global right ventricle has normal systolic function.The right ventricular size  is normal. No increase in right ventricular wall thickness.  6. Left atrial size was moderately dilated.  7. Right atrial size was normal.  8. The mitral valve is normal in structure. Trivial mitral valve regurgitation. No evidence of mitral stenosis.  9. The tricuspid valve is normal in structure. 10. The aortic valve is normal in structure. Aortic valve regurgitation is mild. Mild to moderate aortic valve sclerosis/calcification without any evidence of aortic stenosis. 11. The pulmonic valve was normal in structure. Pulmonic valve regurgitation is not visualized. 12. Aneurysm of the ascending aorta. 13. There is mild dilatation of the ascending aorta measuring 41 mm. 14. The inferior vena cava is normal in size with greater than 50% respiratory variability, suggesting right atrial pressure of 3 mmHg.     ASSESSMENT AND PLAN:  1. Acute on chronic diastolic CHF: Echo in December showed gr 2 diastolic dysfunction. Now mildly volume overloaded due to recent hospitalization with transfusion of 6 units of blood.  - Recommend resuming  lasix 40mg  daily  - Continue nebivolol - ARB discontinued - We discussed the importance of a low sodium diet and monitoring her weight daily.   2. Paroxysmal atrial fibrillation: She is in sinus rhythm today - Continue flecainide for rhythm control - Continue nebivolol for rate control - Continue eliquis for stroke ppx.  - recommend she stop taking Xarelto ( on wrong dose anyway)  3. HTN: BP controlled.  - Continue nebivolol   4. Recent UGI bleed. Hgb improved to 9.3. eliquis resumed. Monitor for recurrent bleeding. If she has recurrent bleeding may need to stay off anticoagulation.    5. PVCs: no complaints of palpitations. No PVCs on EKG today.  - Continue nebivolol  6. Pancytopenia. Per primary care.   7. Lung lesion ? Cancer. Patient does not want this rechecked because she doesn't plan on treating.   8. Leukoplakia. Will try Magic mouthwash  for one week. If no better consider seeing ENT.    Current medicines are reviewed at length with the patient today.  The patient does not have concerns regarding medicines.  The following changes have been made:  no change  Labs/ tests ordered today include:   Orders Placed This Encounter  Procedures  . EKG 12-Lead     Disposition:   FU with Dr. Martinique in  3 months  Signed,  Nashika Coker [place], MD  10/25/2019 12:23 PM

## 2019-10-20 ENCOUNTER — Non-Acute Institutional Stay: Payer: Medicare Other | Admitting: Internal Medicine

## 2019-10-20 ENCOUNTER — Other Ambulatory Visit: Payer: Self-pay

## 2019-10-20 ENCOUNTER — Encounter: Payer: Self-pay | Admitting: Internal Medicine

## 2019-10-20 VITALS — BP 132/78 | HR 70 | Ht 64.0 in | Wt 192.6 lb

## 2019-10-20 DIAGNOSIS — I5032 Chronic diastolic (congestive) heart failure: Secondary | ICD-10-CM

## 2019-10-20 DIAGNOSIS — R110 Nausea: Secondary | ICD-10-CM

## 2019-10-20 DIAGNOSIS — D62 Acute posthemorrhagic anemia: Secondary | ICD-10-CM

## 2019-10-20 DIAGNOSIS — K121 Other forms of stomatitis: Secondary | ICD-10-CM | POA: Diagnosis not present

## 2019-10-20 MED ORDER — FUROSEMIDE 40 MG PO TABS: 40.0000 mg | ORAL_TABLET | Freq: Every day | ORAL | 3 refills | Status: DC

## 2019-10-20 MED ORDER — LIDOCAINE VISCOUS HCL 2 % MT SOLN: 15.0000 mL | OROMUCOSAL | 3 refills | Status: DC | PRN

## 2019-10-20 MED ORDER — ONDANSETRON HCL 4 MG PO TABS: 4.0000 mg | ORAL_TABLET | Freq: Three times a day (TID) | ORAL | 0 refills | Status: DC | PRN

## 2019-10-20 NOTE — Progress Notes (Addendum)
Location:  [facility] Provider: Ioan Landini L. Mariea Clonts, D.O., C.M.D.  Code Status: DNR Goals of Care:  Advanced Directives 10/20/2019  Does Patient Have a Medical Advance Directive? Yes  Type of Paramedic of [place];Living will;Out of facility DNR (pink MOST or yellow form)  Does patient want to make changes to medical advance directive? No - Patient declined  Copy of [place] in Chart? -  Would patient like information on creating a medical advance directive? -  Pre-existing out of facility DNR order (yellow form or pink MOST form) -     Chief Complaint  Patient presents with  . Transitions Of Care    Discharged 1/15/2021Hospital f/u    HPI: Patient is a 86 y.o. female with h/o alcohol use, afib on eliquis, chronic diastolic chf, gerd, depression, low back pain and others seen today for hospital follow-up s/p admission from 1/10-1/15/21 with upper GI bleeding.  I had discovered at one of her visits with me that she'd had anemia noted on labs end of last year with cardiology.  When I rechecked, she'd become more anemic.  She's been on eliquis for afib.  Unfortunately, over the holidays, her hgb continued to drop and when I saw her in Jan, it was in the 7s.  I rechecked to confirm it was really that low and it was about the same.  We were trying to arrange outpatient transfusion b/c she was not wanting hospitalization but she took a turn for the worse.  She'd been having dark tarry stools for two weeks, intake declined, she was nauseous and dyspneic with minimal exertion, very pale so she went to the ED.  She was given protonix IV, FFP and order for 2 units PRBCs after hgb was 4.9!  She actually got transfused twice.  She underwent endoscopy on 1/12 where esophagitis was found and a single small angiodysplastic lesion that was not actively bleeding in her gastric body--it was coagulated.  Her hgb stabilized to 9.6 at d/c on 1/15.  We will recheck  it.  She feels much better.  No longer sob and weak as a dishrag; however, she has developed two ulcerations on her tongue that have now been there two weeks (occurred while hospitalized).  They are gray in color and appear now more like growths on the tongue.  They are painful and burn when she eats or drinks.     Her diuretic was stopped when intake poor during hospitalization and she was dehydrated.  Now swelling is back with a vengeance.  Past Medical History:  Diagnosis Date  . A-fib ([place])   . Abdominal pain, unspecified site   . Abnormality of gait 06/02/2013  . Arthritis   . Cataracts, bilateral   . Depression   . Diverticulosis of colon (without mention of hemorrhage)   . Diverticulosis of colon (without mention of hemorrhage) 10/27/2012  . Edema 10/27/2012  . GI bleed 06/2002  . Hemorrhage of gastrointestinal tract, unspecified   . History of stomach ulcers   . Hypertension   . Insomnia, unspecified 10/27/2012  . Localized osteoarthrosis not specified whether primary or secondary, lower leg 05/31/2011  . Migraines   . Nausea alone 10/28/2012  . Obesity, unspecified 10/27/2012  . Osteoarthrosis, unspecified whether generalized or localized, lower leg   . Other acariasis    peripherial neuropathy  . Other and unspecified alcohol dependence, continuous drinking behavior 10/28/2012  . Palpitations   . PVC (premature ventricular contraction)   .  Unspecified hereditary and idiopathic peripheral neuropathy   . UTI (lower urinary tract infection)    pt state uti w/ no pain    Past Surgical History:  Procedure Laterality Date  . ARTHROSCOPIC REPAIR ACL    . BREAST BIOPSY  1973   left  . BREAST REDUCTION SURGERY  12/23/2003  . cataracts    . CHOLECYSTECTOMY  2005  . ESOPHAGOGASTRODUODENOSCOPY (EGD) WITH PROPOFOL N/A 10/05/2019   Procedure: ESOPHAGOGASTRODUODENOSCOPY (EGD) WITH PROPOFOL;  Surgeon: Wonda Horner, MD;  Location: [facility] ENDOSCOPY;  Service: Endoscopy;  Laterality: N/A;  . HOT  HEMOSTASIS N/A 10/05/2019   Procedure: HOT HEMOSTASIS (ARGON PLASMA COAGULATION/BICAP);  Surgeon: Wonda Horner, MD;  Location: [facility] ENDOSCOPY;  Service: Endoscopy;  Laterality: N/A;  . TONSILLECTOMY AND ADENOIDECTOMY  1941    Allergies  Allergen Reactions  . Mirtazapine Other (See Comments)    tremor  . Nickel Other (See Comments)    inflammation  . Other Itching    Acrylic nails, and trace metals  . Prednisone Other (See Comments)    Causes sleep disturbances     Outpatient Encounter Medications as of 10/20/2019  Medication Sig  . acetaminophen (TYLENOL) 500 MG tablet Take 750-1,000 mg by mouth See admin instructions. Take 2 tablets (1000 mg) by mouth every morning and 1 1/2 tablets (750 mg) at night  . allopurinol (ZYLOPRIM) 100 MG tablet Take 1 tablet (100 mg total) by mouth daily.  . flecainide (TAMBOCOR) 50 MG tablet TAKE 1 TABLET BY MOUTH TWICE DAILY FOR ATRIAL FIBRILLATION  . Melatonin 5 MG TABS Take 5 mg by mouth at bedtime.  . nebivolol (BYSTOLIC) 10 MG tablet Take 1 tablet (10 mg total) by mouth daily.  . pantoprazole (PROTONIX) 40 MG tablet Take 1 tablet (40 mg total) by mouth 2 (two) times daily.  . Rivaroxaban 15 & 20 MG TBPK Follow package directions: Take one 15mg  tablet by mouth twice a day. On day 22, switch to one 20mg  tablet once a day. Take with food.  . zolpidem (AMBIEN) 5 MG tablet Take 1 tablet (5 mg total) by mouth at bedtime.   No facility-administered encounter medications on file as of 10/20/2019.    Review of Systems:  Review of Systems  Constitutional: Positive for malaise/fatigue. Negative for chills and fever.  HENT: Negative for congestion.        Sores on tongue  Eyes: Negative for blurred vision.  Respiratory: Negative for cough and shortness of breath.   Cardiovascular: Negative for chest pain, palpitations and leg swelling.  Gastrointestinal: Positive for nausea. Negative for abdominal pain, blood in stool, constipation, diarrhea, heartburn,  melena and vomiting.       Still occasionally nauseous  Genitourinary: Negative for dysuria.  Musculoskeletal: Positive for back pain. Negative for falls and joint pain.  Skin: Negative for itching and rash.  Neurological: Negative for dizziness and loss of consciousness.  Endo/Heme/Allergies: Bruises/bleeds easily.  Psychiatric/Behavioral: Negative for depression and memory loss. The patient is not nervous/anxious and does not have insomnia.     Health Maintenance  Topic Date Due  . TETANUS/TDAP  09/23/2020  . INFLUENZA VACCINE  Completed  . DEXA SCAN  Completed  . PNA vac Low Risk Adult  Completed    Physical Exam: Vitals:   10/20/19 1524  BP: 132/78  Pulse: 70  SpO2: 97%  Weight: 192 lb 9.6 oz (87.4 kg)  Height: 5\' 4"  (1.626 m)   Body mass index is 33.06 kg/m. Physical Exam Vitals reviewed.  Constitutional:      General: She is not in acute distress.    Appearance: Normal appearance. She is not toxic-appearing.  HENT:     Head: Normocephalic and atraumatic.  Cardiovascular:     Rate and Rhythm: Rhythm irregular.     Heart sounds: Normal heart sounds.  Pulmonary:     Effort: Pulmonary effort is normal.     Breath sounds: Normal breath sounds. No wheezing, rhonchi or rales.  Abdominal:     General: Bowel sounds are normal. There is no distension.     Palpations: Abdomen is soft. There is no mass.     Tenderness: There is no abdominal tenderness. There is no guarding or rebound.  Musculoskeletal:        General: Normal range of motion.     Right lower leg: Edema present.     Left lower leg: Edema present.  Skin:    General: Skin is warm and dry.     Comments: Pallor gone  Neurological:     General: No focal deficit present.     Mental Status: She is alert and oriented to person, place, and time. Mental status is at baseline.     Motor: No weakness.     Gait: Gait abnormal.     Comments: Uses manual wheelchair  Psychiatric:        Mood and Affect: Mood  normal.     Labs reviewed: Basic Metabolic Panel: Recent Labs    10/05/19 0317 10/05/19 1005 10/07/19 0237  NA 138 137 136  K 3.5 3.8 3.6  CL 101 101 99  CO2 27 27 27   GLUCOSE 134* 125* 109*  BUN 16 14 9   CREATININE 0.99 0.91 0.99  CALCIUM 8.4* 8.4* 8.3*   Liver Function Tests: Recent Labs    10/04/19 0550 10/05/19 1005 10/07/19 0237  AST 11* 13* 13*  ALT 10 11 11   ALKPHOS 55 52 57  BILITOT 1.0 1.0 1.1  PROT 5.2* 5.1* 5.1*  ALBUMIN 3.1* 3.0* 2.9*   Recent Labs    10/03/19 1756  LIPASE 19   No results for input(s): AMMONIA in the last 8760 hours. CBC: Recent Labs    10/05/19 0317 10/05/19 0317 10/06/19 0257 10/07/19 0237 10/08/19 0910  WBC 1.9*   < > 1.7* 2.2* 2.0*  NEUTROABS 1.0*  --  0.9* 0.9*  --   HGB 7.2*   < > 6.6* 9.2* 9.6*  HCT 22.8*   < > 21.1* 27.6* 29.8*  MCV 95.4   < > 96.8 91.1 93.1  PLT 121*   < > 128* 119* 120*   < > = values in this interval not displayed.   Lipid Panel: Recent Labs    09/27/19 1053  CHOL 113  HDL 39*  LDLCALC 53  TRIG 129  CHOLHDL 2.9   Lab Results  Component Value Date   HGBA1C 5.6 09/27/2019    Procedures since last visit: CT ABDOMEN PELVIS WO CONTRAST  Result Date: 10/03/2019 CLINICAL DATA:  86 year old female with dark tarry stools suggesting GI bleed. Epigastric pain. EXAM: CT ABDOMEN AND PELVIS WITHOUT CONTRAST TECHNIQUE: Multidetector CT imaging of the abdomen and pelvis was performed following the standard protocol without IV contrast. COMPARISON:  CT chest 04/08/2019. CT Abdomen and Pelvis 05/09/2016 and earlier. FINDINGS: Lower chest: Stable cardiomegaly since 2017. No pericardial or pleural effusion. Mild chronic lung base scarring is stable. Hepatobiliary: Surgically absent gallbladder. Negative noncontrast liver. Pancreas: Chronic fatty atrophy of the pancreas with no mass  or inflammation identified. Spleen: Borderline to mild splenomegaly is stable since 2017. Adrenals/Urinary Tract: Normal adrenal  glands. Negative noncontrast kidneys. Negative ureters. Unremarkable urinary bladder. No urinary calculus. Stomach/Bowel: The rectum appears to remain normal despite increased presacral stranding since 2017. Redundant sigmoid colon with mild diverticulosis and no active inflammation. Similar appearance of the descending colon. There is some fluid at the splenic flexure and in the transverse colon. Fluid-filled but otherwise negative right colon. Normal appendix on series 3, image 67 tracks to the right pelvic side wall. Decompressed and negative terminal ileum. No dilated small bowel. Negative noncontrast CT appearance of the stomach. There is a 3 centimeter duodenal diverticulum at the junction of the 2nd and 3rd portions (series 6, image 37), but otherwise negative duodenum. No mesenteric stranding identified. Vascular/Lymphatic: Aortoiliac calcified atherosclerosis. Vascular patency is not evaluated in the absence of IV contrast. Reproductive: Negative noncontrast appearance. Other: There is presacral stranding (series 3, image 70) which has mildly increased from 2017. No pelvic free fluid. Musculoskeletal: No acute osseous abnormality identified. IMPRESSION: 1. No acute or inflammatory process identified in the noncontrast Abdomen. A 3 cm diverticulum of the duodenum is chronic and these are generally asymptomatic. 2. No acute finding in the noncontrast Pelvis; nonspecific presacral stranding has mildly increased since 2017 but the nearby rectum remains within normal limits. 3. Aortic Atherosclerosis (ICD10-I70.0). Electronically Signed   By: Genevie Ann M.D.   On: 10/03/2019 22:22   DG Chest Portable 1 View  Result Date: 10/03/2019 CLINICAL DATA:  Fatigue, GI bleed EXAM: PORTABLE CHEST 1 VIEW COMPARISON:  08/08/2018 chest radiograph. FINDINGS: Stable cardiomediastinal silhouette with top-normal heart size and tortuous atherosclerotic thoracic aorta. No pneumothorax. No pleural effusion. Lungs appear clear, with  no acute consolidative airspace disease and no pulmonary edema. IMPRESSION: No active disease. Electronically Signed   By: Ilona Sorrel M.D.   On: 10/03/2019 18:19    Assessment/Plan 1. Oral ulceration -she requests these be treated palliatively--does not want to go to ENT for biopsy -it is concerning with her longstanding alcohol dependence -discussed using a straw for beverages and eating softer items, could do smoothies, milkshakes--she agreed to trying this - lidocaine (XYLOCAINE) 2 % solution; Use as directed 15 mLs in the mouth or throat every 4 (four) hours as needed for mouth pain.  Dispense: 100 mL; Refill: 3  2. Acute blood loss anemia -recheck cbc tomorrow -had improved to 9.6 at hospital discharge and she was put on xarelto now in place of eliquis for her afib  3. Chronic diastolic (congestive) heart failure (HCC) - resume lasix at lower dose of 40mg  instead of 60mg  daily--her caregiver was concerned she may not drink enough and I told her to let us know if that happens b/c we may need to reduce the lasix to qod - furosemide (LASIX) 40 MG tablet; Take 1 tablet (40 mg total) by mouth daily.  Dispense: 30 tablet; Refill: 3  4. Nausea -occasionally still feels nauseous which has been longstanding for her--I think it's b/c she does not always eat regularly - ondansetron (ZOFRAN) 4 MG tablet; Take 1 tablet (4 mg total) by mouth every 8 (eight) hours as needed for nausea or vomiting.  Dispense: 30 tablet; Refill: 0  Labs/tests ordered:   Cbc, bmp tomorrow  Next appt:  F/u as needed and certainly at 3 mos.  Eisen Robenson L. Gianah Batt, D.O. [facility] [address], [place] [address] Cell Phone (Mon-Fri 8am-5pm):  [phone]  On Call:  [phone] & follow prompts after 5pm & weekends Office Phone:  [phone] Office Fax:  [phone]

## 2019-10-21 ENCOUNTER — Other Ambulatory Visit: Payer: Medicare Other

## 2019-10-21 ENCOUNTER — Other Ambulatory Visit: Payer: Self-pay

## 2019-10-21 DIAGNOSIS — I10 Essential (primary) hypertension: Secondary | ICD-10-CM | POA: Diagnosis not present

## 2019-10-21 DIAGNOSIS — I700 Atherosclerosis of aorta: Secondary | ICD-10-CM | POA: Diagnosis not present

## 2019-10-21 LAB — COMPLETE METABOLIC PANEL WITH GFR
AG Ratio: 1.8 (calc) (ref 1.0–2.5)
ALT: 6 U/L (ref 6–29)
AST: 11 U/L (ref 10–35)
Albumin: 3.6 g/dL (ref 3.6–5.1)
Alkaline phosphatase (APISO): 79 U/L (ref 37–153)
BUN: 11 mg/dL (ref 7–25)
CO2: 28 mmol/L (ref 20–32)
Calcium: 8.3 mg/dL — ABNORMAL LOW (ref 8.6–10.4)
Chloride: 103 mmol/L (ref 98–110)
Creat: 0.81 mg/dL (ref 0.60–0.88)
GFR, Est African American: 77 mL/min/{1.73_m2} (ref >=60)
GFR, Est Non African American: 67 mL/min/{1.73_m2} (ref >=60)
Globulin: 2 g/dL (calc) (ref 1.9–3.7)
Glucose, Bld: 148 mg/dL — ABNORMAL HIGH (ref 65–139)
Potassium: 3.8 mmol/L (ref 3.5–5.3)
Sodium: 139 mmol/L (ref 135–146)
Total Bilirubin: 1 mg/dL (ref 0.2–1.2)
Total Protein: 5.6 g/dL — ABNORMAL LOW (ref 6.1–8.1)

## 2019-10-21 LAB — CBC WITH DIFFERENTIAL/PLATELET
Absolute Monocytes: 97 cells/uL — ABNORMAL LOW (ref 200–950)
Basophils Absolute: 0 cells/uL (ref 0–200)
Basophils Relative: 0 %
Eosinophils Absolute: 9 cells/uL — ABNORMAL LOW (ref 15–500)
Eosinophils Relative: 0.5 %
HCT: 28.5 % — ABNORMAL LOW (ref 35.0–45.0)
Hemoglobin: 9.3 g/dL — ABNORMAL LOW (ref 11.7–15.5)
Lymphs Abs: 1017 cells/uL (ref 850–3900)
MCH: 30.7 pg (ref 27.0–33.0)
MCHC: 32.6 g/dL (ref 32.0–36.0)
MCV: 94.1 fL (ref 80.0–100.0)
MPV: 9.4 fL (ref 7.5–12.5)
Monocytes Relative: 5.4 %
Neutro Abs: 677 cells/uL — ABNORMAL LOW (ref 1500–7800)
Neutrophils Relative %: 37.6 %
Platelets: 127 10*3/uL — ABNORMAL LOW (ref 140–400)
RBC: 3.03 10*6/uL — ABNORMAL LOW (ref 3.80–5.10)
RDW: 17.6 % — ABNORMAL HIGH (ref 11.0–15.0)
Total Lymphocyte: 56.5 %
WBC: 1.8 10*3/uL — ABNORMAL LOW (ref 3.8–10.8)

## 2019-10-21 NOTE — Progress Notes (Signed)
Her anemia is stable at present.  She also has low white cells and platelets (pancytopenia).  I'm concerned this may be related to the lung mass.  We will continue to monitor.  Hopefully, her tongue sores are doing better with the mouthwash I ordered.

## 2019-10-25 ENCOUNTER — Encounter: Payer: Self-pay | Admitting: Cardiology

## 2019-10-25 ENCOUNTER — Other Ambulatory Visit: Payer: Self-pay

## 2019-10-25 ENCOUNTER — Ambulatory Visit (INDEPENDENT_AMBULATORY_CARE_PROVIDER_SITE_OTHER): Payer: Medicare Other | Admitting: Cardiology

## 2019-10-25 VITALS — BP 116/54 | HR 74 | Temp 97.0°F | Ht 67.75 in | Wt 188.8 lb

## 2019-10-25 DIAGNOSIS — I480 Paroxysmal atrial fibrillation: Secondary | ICD-10-CM

## 2019-10-25 DIAGNOSIS — I5032 Chronic diastolic (congestive) heart failure: Secondary | ICD-10-CM

## 2019-10-25 DIAGNOSIS — K922 Gastrointestinal hemorrhage, unspecified: Secondary | ICD-10-CM

## 2019-10-25 DIAGNOSIS — K1321 Leukoplakia of oral mucosa, including tongue: Secondary | ICD-10-CM | POA: Diagnosis not present

## 2019-10-25 DIAGNOSIS — I5033 Acute on chronic diastolic (congestive) heart failure: Secondary | ICD-10-CM

## 2019-10-25 DIAGNOSIS — I10 Essential (primary) hypertension: Secondary | ICD-10-CM

## 2019-10-25 MED ORDER — MAGIC MOUTHWASH: 5.0000 mL | Freq: Four times a day (QID) | ORAL | Status: DC

## 2019-10-25 MED ORDER — MAGIC MOUTHWASH: ORAL | 0 refills | Status: DC

## 2019-10-25 NOTE — Patient Instructions (Signed)
Stop taking Xarelto  Resume Eliquis 5 mg bid tomorrow  Magic mouthwash 5 cc four times daily for one week.

## 2019-10-29 DIAGNOSIS — R2689 Other abnormalities of gait and mobility: Secondary | ICD-10-CM | POA: Diagnosis not present

## 2019-10-29 DIAGNOSIS — R601 Generalized edema: Secondary | ICD-10-CM | POA: Diagnosis not present

## 2019-10-29 DIAGNOSIS — M79604 Pain in right leg: Secondary | ICD-10-CM | POA: Diagnosis not present

## 2019-10-29 DIAGNOSIS — M79605 Pain in left leg: Secondary | ICD-10-CM | POA: Diagnosis not present

## 2019-10-29 DIAGNOSIS — I5032 Chronic diastolic (congestive) heart failure: Secondary | ICD-10-CM | POA: Diagnosis not present

## 2019-10-29 DIAGNOSIS — K922 Gastrointestinal hemorrhage, unspecified: Secondary | ICD-10-CM | POA: Diagnosis not present

## 2019-10-29 DIAGNOSIS — R278 Other lack of coordination: Secondary | ICD-10-CM | POA: Diagnosis not present

## 2019-10-29 DIAGNOSIS — M6389 Disorders of muscle in diseases classified elsewhere, multiple sites: Secondary | ICD-10-CM | POA: Diagnosis not present

## 2019-10-29 DIAGNOSIS — I480 Paroxysmal atrial fibrillation: Secondary | ICD-10-CM | POA: Diagnosis not present

## 2019-10-29 DIAGNOSIS — I10 Essential (primary) hypertension: Secondary | ICD-10-CM | POA: Diagnosis not present

## 2019-10-29 DIAGNOSIS — D62 Acute posthemorrhagic anemia: Secondary | ICD-10-CM | POA: Diagnosis not present

## 2019-11-01 DIAGNOSIS — I5032 Chronic diastolic (congestive) heart failure: Secondary | ICD-10-CM | POA: Diagnosis not present

## 2019-11-01 DIAGNOSIS — R2689 Other abnormalities of gait and mobility: Secondary | ICD-10-CM | POA: Diagnosis not present

## 2019-11-01 DIAGNOSIS — M79604 Pain in right leg: Secondary | ICD-10-CM | POA: Diagnosis not present

## 2019-11-01 DIAGNOSIS — D62 Acute posthemorrhagic anemia: Secondary | ICD-10-CM | POA: Diagnosis not present

## 2019-11-01 DIAGNOSIS — M6389 Disorders of muscle in diseases classified elsewhere, multiple sites: Secondary | ICD-10-CM | POA: Diagnosis not present

## 2019-11-01 DIAGNOSIS — R278 Other lack of coordination: Secondary | ICD-10-CM | POA: Diagnosis not present

## 2019-11-02 ENCOUNTER — Telehealth: Payer: Self-pay | Admitting: *Deleted

## 2019-11-02 DIAGNOSIS — R2689 Other abnormalities of gait and mobility: Secondary | ICD-10-CM | POA: Diagnosis not present

## 2019-11-02 DIAGNOSIS — I5032 Chronic diastolic (congestive) heart failure: Secondary | ICD-10-CM | POA: Diagnosis not present

## 2019-11-02 DIAGNOSIS — R278 Other lack of coordination: Secondary | ICD-10-CM | POA: Diagnosis not present

## 2019-11-02 DIAGNOSIS — K121 Other forms of stomatitis: Secondary | ICD-10-CM

## 2019-11-02 DIAGNOSIS — M79604 Pain in right leg: Secondary | ICD-10-CM | POA: Diagnosis not present

## 2019-11-02 DIAGNOSIS — D62 Acute posthemorrhagic anemia: Secondary | ICD-10-CM | POA: Diagnosis not present

## 2019-11-02 DIAGNOSIS — M6389 Disorders of muscle in diseases classified elsewhere, multiple sites: Secondary | ICD-10-CM | POA: Diagnosis not present

## 2019-11-02 NOTE — Telephone Encounter (Signed)
Patient notified and agreed.  

## 2019-11-02 NOTE — Telephone Encounter (Signed)
Patient called requesting referral to ENT for the sore in her mouth. Stated it is no better. The numbing gel is not working and her pain is a 7-8/10. Wants referral placed to ENT. Please Advise.

## 2019-11-02 NOTE — Telephone Encounter (Signed)
I'm sorry to hear that the mouthwashes and numbing gel have not helped and the ulcers are still there.  I placed the referral.

## 2019-11-03 DIAGNOSIS — D62 Acute posthemorrhagic anemia: Secondary | ICD-10-CM | POA: Diagnosis not present

## 2019-11-03 DIAGNOSIS — R2689 Other abnormalities of gait and mobility: Secondary | ICD-10-CM | POA: Diagnosis not present

## 2019-11-03 DIAGNOSIS — R278 Other lack of coordination: Secondary | ICD-10-CM | POA: Diagnosis not present

## 2019-11-03 DIAGNOSIS — M79604 Pain in right leg: Secondary | ICD-10-CM | POA: Diagnosis not present

## 2019-11-03 DIAGNOSIS — M6389 Disorders of muscle in diseases classified elsewhere, multiple sites: Secondary | ICD-10-CM | POA: Diagnosis not present

## 2019-11-03 DIAGNOSIS — I5032 Chronic diastolic (congestive) heart failure: Secondary | ICD-10-CM | POA: Diagnosis not present

## 2019-11-04 ENCOUNTER — Other Ambulatory Visit: Payer: Self-pay

## 2019-11-04 ENCOUNTER — Other Ambulatory Visit (HOSPITAL_COMMUNITY)
Admission: RE | Admit: 2019-11-04 | Discharge: 2019-11-04 | Disposition: A | Discharge status: Home / Self Care | Payer: Medicare Other | Source: Ambulatory Visit | Attending: Otolaryngology | Admitting: Otolaryngology

## 2019-11-04 ENCOUNTER — Encounter (INDEPENDENT_AMBULATORY_CARE_PROVIDER_SITE_OTHER): Payer: Self-pay | Admitting: Otolaryngology

## 2019-11-04 ENCOUNTER — Ambulatory Visit (INDEPENDENT_AMBULATORY_CARE_PROVIDER_SITE_OTHER): Payer: Medicare Other | Admitting: Otolaryngology

## 2019-11-04 VITALS — Temp 97.5°F

## 2019-11-04 DIAGNOSIS — J029 Acute pharyngitis, unspecified: Secondary | ICD-10-CM | POA: Diagnosis not present

## 2019-11-04 DIAGNOSIS — K148 Other diseases of tongue: Secondary | ICD-10-CM

## 2019-11-04 DIAGNOSIS — K140 Glossitis: Secondary | ICD-10-CM | POA: Diagnosis not present

## 2019-11-04 NOTE — Progress Notes (Signed)
HPI: [name] is a 86 y.o. female who presents is referred by Dr. Mariea Clonts for evaluation of chronic sore throat and weight loss.  She has been recently discharged from [facility] following acute onset of anemia secondary to upper GI bleed from the stomach.  She has been on Eliquis.  She received several units of transfusion and had upper GI endoscopy that revealed an apparent bleeding site in the stomach.  Following her discharge from the hospital she has had a chronic sore tongue on the right side where she noticed initially some ulcers.  She thought this might have been related to the upper endoscopy.  She has also had a chronic cough for several years. She lives at [facility] and is followed at [facility].. She has been using viscous lidocaine as well as a mouthwash with nystatin for the past week.  Past Medical History:  Diagnosis Date  . A-fib ([place])   . Abdominal pain, unspecified site   . Abnormality of gait 06/02/2013  . Arthritis   . Cataracts, bilateral   . Depression   . Diverticulosis of colon (without mention of hemorrhage)   . Diverticulosis of colon (without mention of hemorrhage) 10/27/2012  . Edema 10/27/2012  . GI bleed 06/2002  . Hemorrhage of gastrointestinal tract, unspecified   . History of stomach ulcers   . Hypertension   . Insomnia, unspecified 10/27/2012  . Localized osteoarthrosis not specified whether primary or secondary, lower leg 05/31/2011  . Migraines   . Nausea alone 10/28/2012  . Obesity, unspecified 10/27/2012  . Osteoarthrosis, unspecified whether generalized or localized, lower leg   . Other acariasis    peripherial neuropathy  . Other and unspecified alcohol dependence, continuous drinking behavior 10/28/2012  . Palpitations   . PVC (premature ventricular contraction)   . Unspecified hereditary and idiopathic peripheral neuropathy   . UTI (lower urinary tract infection)    pt state uti w/ no pain   Past Surgical History:  Procedure Laterality  Date  . ARTHROSCOPIC REPAIR ACL    . BREAST BIOPSY  1973   left  . BREAST REDUCTION SURGERY  12/23/2003  . cataracts    . CHOLECYSTECTOMY  2005  . ESOPHAGOGASTRODUODENOSCOPY (EGD) WITH PROPOFOL N/A 10/05/2019   Procedure: ESOPHAGOGASTRODUODENOSCOPY (EGD) WITH PROPOFOL;  Surgeon: Wonda Horner, MD;  Location: [facility] ENDOSCOPY;  Service: Endoscopy;  Laterality: N/A;  . HOT HEMOSTASIS N/A 10/05/2019   Procedure: HOT HEMOSTASIS (ARGON PLASMA COAGULATION/BICAP);  Surgeon: Wonda Horner, MD;  Location: [facility] ENDOSCOPY;  Service: Endoscopy;  Laterality: N/A;  . TONSILLECTOMY AND ADENOIDECTOMY  1941   Social History   Socioeconomic History  . Marital status: Widowed    Spouse name: Not on file  . Number of children: Not on file  . Years of education: Not on file  . Highest education level: Not on file  Occupational History  . Occupation: retired Marine scientist  Tobacco Use  . Smoking status: Former Smoker    Packs/day: 1.00    Years: 30.00    Pack years: 30.00    Types: Cigarettes    Quit date: 10/09/1983    Years since quitting: 36.0  . Smokeless tobacco: Never Used  Substance and Sexual Activity  . Alcohol use: Yes    Alcohol/week: 1.0 - 2.0 standard drinks    Types: 1 - 2 Shots of liquor per week    Comment: 2 DRINKS A DAY - daily  scotch   . Drug use: No  . Sexual activity:  Never  Other Topics Concern  . Not on file  Social History Narrative   Lives at [facility], in [place] -2004   Stopped smoking 1985   POA, DNR, Living Will   Walks with walker   Alcohol scotch daily    Exercise none   Social Determinants of Health   Financial Resource Strain:   . Difficulty of Paying Living Expenses: Not on file  Food Insecurity:   . Worried About Charity fundraiser in the Last Year: Not on file  . Ran Out of Food in the Last Year: Not on file  Transportation Needs:   . Lack of Transportation (Medical): Not on file  . Lack of Transportation (Non-Medical): Not on file  Physical Activity:    . Days of Exercise per Week: Not on file  . Minutes of Exercise per Session: Not on file  Stress:   . Feeling of Stress : Not on file  Social Connections:   . Frequency of Communication with Friends and Family: Not on file  . Frequency of Social Gatherings with Friends and Family: Not on file  . Attends Religious Services: Not on file  . Active Member of Clubs or Organizations: Not on file  . Attends Archivist Meetings: Not on file  . Marital Status: Not on file   Family History  Problem Relation Age of Onset  . Breast cancer Mother   . Hypertension Mother   . Hypertension Father   . Allergic rhinitis Neg Hx   . Asthma Neg Hx    Allergies  Allergen Reactions  . Mirtazapine Other (See Comments)    tremor  . Nickel Other (See Comments)    inflammation  . Other Itching    Acrylic nails, and trace metals  . Prednisone Other (See Comments)    Causes sleep disturbances    Prior to Admission medications   Medication Sig Start Date End Date Taking? Authorizing Provider  acetaminophen (TYLENOL) 500 MG tablet Take 750-1,000 mg by mouth See admin instructions. Take 2 tablets (1000 mg) by mouth every morning and 1 1/2 tablets (750 mg) at night   Yes [provider]  allopurinol (ZYLOPRIM) 100 MG tablet Take 1 tablet (100 mg total) by mouth daily. 09/27/19  Yes Reed, Tiffany L, DO  flecainide (TAMBOCOR) 50 MG tablet TAKE 1 TABLET BY MOUTH TWICE DAILY FOR ATRIAL FIBRILLATION 06/29/19  Yes Reed, Tiffany L, DO  furosemide (LASIX) 40 MG tablet Take 1 tablet (40 mg total) by mouth daily. 10/20/19  Yes Reed, Tiffany L, DO  lidocaine (XYLOCAINE) 2 % solution Use as directed 15 mLs in the mouth or throat every 4 (four) hours as needed for mouth pain. 10/20/19  Yes Reed, Tiffany L, DO  magic mouthwash SOLN Take 5 ml 4 times a day for 7 days 10/25/19  Yes Martinique, Peter M, MD  Melatonin 5 MG TABS Take 5 mg by mouth at bedtime.   Yes [provider]  nebivolol (BYSTOLIC) 10 MG  tablet Take 1 tablet (10 mg total) by mouth daily. 06/29/19  Yes Reed, Tiffany L, DO  ondansetron (ZOFRAN) 4 MG tablet Take 1 tablet (4 mg total) by mouth every 8 (eight) hours as needed for nausea or vomiting. 10/20/19  Yes Reed, Tiffany L, DO  pantoprazole (PROTONIX) 40 MG tablet Take 1 tablet (40 mg total) by mouth 2 (two) times daily. 10/08/19 10/07/20 Yes Nita Sells, MD  zolpidem (AMBIEN) 5 MG tablet Take 1 tablet (5 mg  total) by mouth at bedtime. 09/29/19  Yes Reed, Tiffany L, DO     Positive ROS: Otherwise negative  All other systems have been reviewed and were otherwise negative with the exception of those mentioned in the HPI and as above.  Physical Exam: Constitutional: Alert, well-appearing, no acute distress. Ears: External ears without lesions or tenderness. Ear canals are clear bilaterally with intact, clear TMs.  Nasal: External nose without lesions. Septum with minimal deformity.. Clear nasal passages with no signs of infection. Oral: Lips and gums without lesions.  She has 2 sores on the right lateral tongue that bleed easily.  The larger more inferior sore measures approximately 2 cm size and a smaller sore just above the larger 1 measures approximately 1 cm size.  These are very painful to palpation.  They have apparently been present since her discharge.  More posterior oropharynx was clear.  Fiberoptic laryngoscopy through the left nostril revealed a clear nasopharynx the base of tongue vallecula and epiglottis were normal.  The vocal cords were normal bilaterally with normal vocal cord mobility.  Both piriform sinuses were clear.. Neck: No adenopathy noted in the right neck. Respiratory: Breathing comfortably  Skin: No facial/neck lesions or rash noted.  Tongue biopsy  Date/Time: 11/04/2019 9:22 PM Performed by: Rozetta Nunnery, MD Authorized by: Rozetta Nunnery, MD   Consent:    Consent obtained:  Verbal   Consent given by:  Patient   Risks  discussed:  Pain Sedation:    Sedation type:  None Anesthesia:    Anesthesia method:  Local infiltration   Local anesthetic:  Lidocaine 2% WITH epi Procedure Details:    Location:  Tongue Post-procedure details:    Patient tolerance of procedure:  Tolerated well, no immediate complications Comments:     The tongue was sprayed topically with Cetacaine.  The right lateral tongue was then injected with 3 cc of Xylocaine with epinephrine for local anesthetic.  A biopsy was obtained from the smaller more superior lesion on the right lateral tongue and was sent to pathology.  She had minimal bleeding which was controlled with silver nitrate.  She tolerated this well. Laryngoscopy  Date/Time: 11/04/2019 9:27 PM Performed by: Rozetta Nunnery, MD Authorized by: Rozetta Nunnery, MD   Consent:    Consent obtained:  Verbal   Consent given by:  Patient   Risks discussed:  Pain Procedure details:    Indications: direct visualization of the upper aerodigestive tract     Medication:  Afrin Mouth:    Oropharynx: normal     Vallecula: normal     Base of tongue: normal     Epiglottis: normal   Throat:    True vocal cords: normal   Comments:     Fiberoptic laryngoscopy was performed to rule out any other etiology of chronic sore throat.  Upper airway was normal to evaluation except for the right lateral tongue sores    Assessment: Chronic right lateral tongue sores which have been becoming worse with present therapy.  Plan: A a biopsy of the right lateral tongue sore was performed in the office today and sent to pathology. I prescribed amoxicillin 500 mg 3 times daily for [redacted] week along with some samples of Cepacol mouth lozenges.  She will continue with the viscous lidocaine. Also gave her a prescription for Vicodin 5 mg 1-2 every 6 hours as needed pain. She will call us next Tuesday concerning results of the pathology and plan further therapy depending on results  of the  pathology.   Radene Journey, MD   CC:

## 2019-11-05 DIAGNOSIS — R2689 Other abnormalities of gait and mobility: Secondary | ICD-10-CM | POA: Diagnosis not present

## 2019-11-05 DIAGNOSIS — I5032 Chronic diastolic (congestive) heart failure: Secondary | ICD-10-CM | POA: Diagnosis not present

## 2019-11-05 DIAGNOSIS — D62 Acute posthemorrhagic anemia: Secondary | ICD-10-CM | POA: Diagnosis not present

## 2019-11-05 DIAGNOSIS — R278 Other lack of coordination: Secondary | ICD-10-CM | POA: Diagnosis not present

## 2019-11-05 DIAGNOSIS — M79604 Pain in right leg: Secondary | ICD-10-CM | POA: Diagnosis not present

## 2019-11-05 DIAGNOSIS — M6389 Disorders of muscle in diseases classified elsewhere, multiple sites: Secondary | ICD-10-CM | POA: Diagnosis not present

## 2019-11-09 DIAGNOSIS — M6389 Disorders of muscle in diseases classified elsewhere, multiple sites: Secondary | ICD-10-CM | POA: Diagnosis not present

## 2019-11-09 DIAGNOSIS — R278 Other lack of coordination: Secondary | ICD-10-CM | POA: Diagnosis not present

## 2019-11-09 DIAGNOSIS — D62 Acute posthemorrhagic anemia: Secondary | ICD-10-CM | POA: Diagnosis not present

## 2019-11-09 DIAGNOSIS — I5032 Chronic diastolic (congestive) heart failure: Secondary | ICD-10-CM | POA: Diagnosis not present

## 2019-11-09 DIAGNOSIS — R2689 Other abnormalities of gait and mobility: Secondary | ICD-10-CM | POA: Diagnosis not present

## 2019-11-09 DIAGNOSIS — M79604 Pain in right leg: Secondary | ICD-10-CM | POA: Diagnosis not present

## 2019-11-09 LAB — SURGICAL PATHOLOGY

## 2019-11-10 ENCOUNTER — Encounter: Payer: Self-pay | Admitting: Internal Medicine

## 2019-11-10 ENCOUNTER — Non-Acute Institutional Stay: Payer: Medicare Other | Admitting: Internal Medicine

## 2019-11-10 ENCOUNTER — Other Ambulatory Visit: Payer: Self-pay

## 2019-11-10 VITALS — BP 118/68 | HR 66 | Temp 99.7°F | Ht 68.0 in | Wt 181.3 lb

## 2019-11-10 DIAGNOSIS — F339 Major depressive disorder, recurrent, unspecified: Secondary | ICD-10-CM

## 2019-11-10 DIAGNOSIS — G4700 Insomnia, unspecified: Secondary | ICD-10-CM | POA: Diagnosis not present

## 2019-11-10 DIAGNOSIS — K121 Other forms of stomatitis: Secondary | ICD-10-CM

## 2019-11-10 DIAGNOSIS — R0600 Dyspnea, unspecified: Secondary | ICD-10-CM | POA: Diagnosis not present

## 2019-11-10 DIAGNOSIS — R0609 Other forms of dyspnea: Secondary | ICD-10-CM

## 2019-11-10 MED ORDER — HYDROCODONE-ACETAMINOPHEN 5-325 MG PO TABS: 1.0000 | ORAL_TABLET | Freq: Four times a day (QID) | ORAL | 0 refills | Status: DC | PRN

## 2019-11-10 MED ORDER — MIRTAZAPINE 7.5 MG PO TABS: 7.5000 mg | ORAL_TABLET | Freq: Every day | ORAL | 3 refills | Status: DC

## 2019-11-10 NOTE — Progress Notes (Signed)
Location:  [place] of Service:  Clinic (12)  Provider: Meron Bocchino L. Mariea Clonts, D.O., C.M.D.  Code Status: DNR Goals of Care: comfort-based Advanced Directives 11/10/2019  Does Patient Have a Medical Advance Directive? Yes  Type of Advance Directive Out of facility DNR (pink MOST or yellow form);Healthcare Power of Attorney  Does patient want to make changes to medical advance directive? No - Patient declined  Copy of [place] in Chart? Yes - validated most recent copy scanned in chart (See row information)  Would patient like information on creating a medical advance directive? -  Pre-existing out of facility DNR order (yellow form or pink MOST form) -     Chief Complaint  Patient presents with  . Medical Management of Chronic Issues    Follow up appointment , results from [address], Lucia Gaskins     HPI: Patient is a 86 y.o. female seen today for an acute visit for continued pain related to her tongue ulcers, poor intake and depression.  OT had been working with her at home and sent me a message and spoke with me about concerns about her mood.  Pt did not bring it up today, but did admit "why wouldn't I be depressed?"  She can't even enjoy her scotch (2 nightly) lately b/c she hurts to drink it.  I had previously suggested straws to help her with all of her liquids, but she says she likes the taste in her mouth especially of her scotch.  She notes that the gel, lidocaine mouthwash and magic mouthwash have not really helped.  She's been gargling with chloraseptic instead which helps more than anything else.  Dr. Lucia Gaskins had biopsied the one ulcer and it came back benign inflammation fortunately (I personally reviewed the path report).  He has advised that they will eventually resolve, but meanwhile, she's losing weight.  She had 1/2 an egg sandwich for lunch today.  The hydrocodone takes the edge off slightly, but that's it.  She does want to have a few on hand for when  it's really severe. We had been considering a rehab stay for pain mgt, but she is not ready to pursue that and wants to see if the new product that Dr. Lucia Gaskins suggested that has been ordered online will be more helpful than everything else.  She has made arrangements that if she ever has to permanently move to SNF, her Perrin Smack will go to her stepdaughter b/c she does not think he will be happy in a small room when he likes to go out.    She's also been noted to be dyspneic on exertion when she gets up to answer the door when nursing visits her or OT.  Of note, she does not generally walk outside of then.  She uses a manual wheelchair and has caregivers who move her.    Past Medical History:  Diagnosis Date  . A-fib ([place])   . Abdominal pain, unspecified site   . Abnormality of gait 06/02/2013  . Arthritis   . Cataracts, bilateral   . Depression   . Diverticulosis of colon (without mention of hemorrhage)   . Diverticulosis of colon (without mention of hemorrhage) 10/27/2012  . Edema 10/27/2012  . GI bleed 06/2002  . Hemorrhage of gastrointestinal tract, unspecified   . History of stomach ulcers   . Hypertension   . Insomnia, unspecified 10/27/2012  . Localized osteoarthrosis not specified whether primary or secondary, lower leg 05/31/2011  . Migraines   .  Nausea alone 10/28/2012  . Obesity, unspecified 10/27/2012  . Osteoarthrosis, unspecified whether generalized or localized, lower leg   . Other acariasis    peripherial neuropathy  . Other and unspecified alcohol dependence, continuous drinking behavior 10/28/2012  . Palpitations   . PVC (premature ventricular contraction)   . Unspecified hereditary and idiopathic peripheral neuropathy   . UTI (lower urinary tract infection)    pt state uti w/ no pain    Past Surgical History:  Procedure Laterality Date  . ARTHROSCOPIC REPAIR ACL    . BREAST BIOPSY  1973   left  . BREAST REDUCTION SURGERY  12/23/2003  . cataracts    . CHOLECYSTECTOMY  2005  .  ESOPHAGOGASTRODUODENOSCOPY (EGD) WITH PROPOFOL N/A 10/05/2019   Procedure: ESOPHAGOGASTRODUODENOSCOPY (EGD) WITH PROPOFOL;  Surgeon: Wonda Horner, MD;  Location[facility] ENDOSCOPY;  Service: Endoscopy;  Laterality: N/A;  . HOT HEMOSTASIS N/A 10/05/2019   Procedure: HOT HEMOSTASIS (ARGON PLASMA COAGULATION/BICAP);  Surgeon: Wonda Horner, MD;  Location: [facility] ENDOSCOPY;  Service: Endoscopy;  Laterality: N/A;  . TONSILLECTOMY AND ADENOIDECTOMY  1941    Allergies  Allergen Reactions  . Mirtazapine Other (See Comments)    tremor  . Nickel Other (See Comments)    inflammation  . Other Itching    Acrylic nails, and trace metals  . Prednisone Other (See Comments)    Causes sleep disturbances     Outpatient Encounter Medications as of 11/10/2019  Medication Sig  . acetaminophen (TYLENOL) 500 MG tablet Take 750-1,000 mg by mouth See admin instructions. Take 2 tablets (1000 mg) by mouth every morning and 1 1/2 tablets (750 mg) at night  . allopurinol (ZYLOPRIM) 100 MG tablet Take 1 tablet (100 mg total) by mouth daily.  Marland Kitchen amoxicillin (AMOXIL) 500 MG capsule Take 500 mg by mouth 3 (three) times daily.  . flecainide (TAMBOCOR) 50 MG tablet TAKE 1 TABLET BY MOUTH TWICE DAILY FOR ATRIAL FIBRILLATION  . furosemide (LASIX) 40 MG tablet Take 1 tablet (40 mg total) by mouth daily.  Marland Kitchen HYDROcodone-acetaminophen (NORCO/VICODIN) 5-325 MG tablet Take 1-2 tablets by mouth every 6 (six) hours as needed.  . lidocaine (XYLOCAINE) 2 % solution Use as directed 15 mLs in the mouth or throat every 4 (four) hours as needed for mouth pain.  . magic mouthwash SOLN Take 5 ml 4 times a day for 7 days  . Melatonin 5 MG TABS Take 5 mg by mouth at bedtime.  . nebivolol (BYSTOLIC) 10 MG tablet Take 1 tablet (10 mg total) by mouth daily.  . ondansetron (ZOFRAN) 4 MG tablet Take 1 tablet (4 mg total) by mouth every 8 (eight) hours as needed for nausea or vomiting.  . pantoprazole (PROTONIX) 40 MG tablet Take 1 tablet (40 mg total) by  mouth 2 (two) times daily.  Marland Kitchen zolpidem (AMBIEN) 5 MG tablet Take 1 tablet (5 mg total) by mouth at bedtime.   No facility-administered encounter medications on file as of 11/10/2019.    Review of Systems:  Review of Systems  Constitutional: Positive for malaise/fatigue and weight loss. Negative for chills and fever.  HENT:       Ulcers on tongue (one was removed during biopsy, other has become flat and even more gray in color)  Eyes: Negative for blurred vision.  Respiratory: Positive for shortness of breath. Negative for cough, sputum production and wheezing.   Cardiovascular: Positive for leg swelling. Negative for chest pain and palpitations.  Gastrointestinal: Negative for abdominal pain, blood in stool,  constipation, diarrhea and melena.  Genitourinary: Negative for dysuria.  Musculoskeletal: Positive for back pain. Negative for falls.  Neurological: Negative for dizziness and loss of consciousness.  Endo/Heme/Allergies: Bruises/bleeds easily.  Psychiatric/Behavioral: Positive for depression. The patient has insomnia. The patient is not nervous/anxious.        She feels like her mind is going    Health Maintenance  Topic Date Due  . TETANUS/TDAP  09/23/2020  . INFLUENZA VACCINE  Completed  . DEXA SCAN  Completed  . PNA vac Low Risk Adult  Completed    Physical Exam: Vitals:   11/10/19 1518  BP: 118/68  Pulse: 66  Temp: 99.7 F (37.6 C)  Weight: 181 lb 4.8 oz (82.2 kg)  Height: 5\' 8"  (1.727 m)  PF: 96 L/min   Body mass index is 27.57 kg/m. Physical Exam Vitals reviewed. Nursing note reviewed: OT messages and IL nurse messages reviewed.  Constitutional:      Appearance: Normal appearance.  HENT:     Head: Normocephalic and atraumatic.     Mouth/Throat:     Comments: Ulcers on tongue (one was removed during biopsy, other has become flat and even more gray in color)  Cardiovascular:     Rate and Rhythm: Normal rate and regular rhythm.     Pulses: Normal pulses.       Heart sounds: Normal heart sounds.  Pulmonary:     Effort: Pulmonary effort is normal.     Breath sounds: Normal breath sounds. No rales.  Musculoskeletal:        General: Normal range of motion.     Right lower leg: Edema present.     Left lower leg: Edema present.  Skin:    General: Skin is warm and dry.     Comments: Ecchymoses right leg, pitting edema of both legs  Neurological:     General: No focal deficit present.     Mental Status: She is alert and oriented to person, place, and time.  Psychiatric:     Comments: Affect more flat than usual; negative about things and not laughing and being as sarcastic and dry      Labs reviewed: Basic Metabolic Panel: Recent Labs    10/05/19 1005 10/07/19 0237 10/21/19 1057  NA 137 136 139  K 3.8 3.6 3.8  CL 101 99 103  CO2 27 27 28   GLUCOSE 125* 109* 148*  BUN 14 9 11   CREATININE 0.91 0.99 0.81  CALCIUM 8.4* 8.3* 8.3*   Liver Function Tests: Recent Labs    10/04/19 0550 10/04/19 0550 10/05/19 1005 10/07/19 0237 10/21/19 1057  AST 11*   < > 13* 13* 11  ALT 10   < > 11 11 6   ALKPHOS 55  --  52 57  --   BILITOT 1.0   < > 1.0 1.1 1.0  PROT 5.2*   < > 5.1* 5.1* 5.6*  ALBUMIN 3.1*  --  3.0* 2.9*  --    < > = values in this interval not displayed.   Recent Labs    10/03/19 1756  LIPASE 19   No results for input(s): AMMONIA in the last 8760 hours. CBC: Recent Labs    10/06/19 0257 10/06/19 0257 10/07/19 0237 10/08/19 0910 10/21/19 1057  WBC 1.7*   < > 2.2* 2.0* 1.8*  NEUTROABS 0.9*  --  0.9*  --  677*  HGB 6.6*   < > 9.2* 9.6* 9.3*  HCT 21.1*   < > 27.6* 29.8* 28.5*  MCV 96.8   < > 91.1 93.1 94.1  PLT 128*   < > 119* 120* 127*   < > = values in this interval not displayed.   Lipid Panel: Recent Labs    09/27/19 1053  CHOL 113  HDL 39*  LDLCALC 53  TRIG 129  CHOLHDL 2.9   Lab Results  Component Value Date   HGBA1C 5.6 09/27/2019    Assessment/Plan 1. Insomnia, unspecified type -will try  her now on remeron for mood, appetite and sleep - mirtazapine (REMERON) 7.5 MG tablet; Take 1 tablet (7.5 mg total) by mouth at bedtime.  Dispense: 30 tablet; Refill: 3  2. Depression, recurrent ([place]) - due to declining health and inability to enjoy things she typically did like eating good food and drinking her scotch - mirtazapine (REMERON) 7.5 MG tablet; Take 1 tablet (7.5 mg total) by mouth at bedtime.  Dispense: 30 tablet; Refill: 3  3. Oral ulceration -try new formula Dr. Lucia Gaskins recommended that will be shipped to her - HYDROcodone-acetaminophen (NORCO/VICODIN) 5-325 MG tablet; Take 1-2 tablets by mouth every 6 (six) hours as needed.  Dispense: 30 tablet; Refill: 0 as backup for severe pain -continue to use straw for most things to avoid contact with sore areas -fortunately, these were determined to be benign and should heal -ok to use chloraseptic in the interim also   4. Dyspnea on exertion -has a lung mass that could not be biopsied due to location and comfort-based goals--this may be underlying all of the above and causing some degree of failure to thrive -she is on lasix 40mg  daily and edema is better than it had been before that became a routine med -some of edema now may be due to malnutrition for the past 5-6 weeks -sats at rest are normal and she does not typically do any physical activity at home (has not been for a long time now)  Labs/tests ordered:  No new Next appt:  PRN   Taziyah Iannuzzi L. Mc Hollen, D.O. [facility] [address], [place] [address] Cell Phone (Mon-Fri 8am-5pm):  [phone] On Call:  [phone] & follow prompts after 5pm & weekends Office Phone:  [phone] Office Fax:  [phone]

## 2019-11-11 ENCOUNTER — Encounter: Payer: Self-pay | Admitting: Internal Medicine

## 2019-11-15 ENCOUNTER — Other Ambulatory Visit (INDEPENDENT_AMBULATORY_CARE_PROVIDER_SITE_OTHER): Payer: Self-pay

## 2019-11-15 MED ORDER — AMOXICILLIN 500 MG PO CAPS: 500.0000 mg | ORAL_CAPSULE | Freq: Two times a day (BID) | ORAL | 0 refills | Status: AC

## 2019-11-16 DIAGNOSIS — D62 Acute posthemorrhagic anemia: Secondary | ICD-10-CM | POA: Diagnosis not present

## 2019-11-16 DIAGNOSIS — M6389 Disorders of muscle in diseases classified elsewhere, multiple sites: Secondary | ICD-10-CM | POA: Diagnosis not present

## 2019-11-16 DIAGNOSIS — R278 Other lack of coordination: Secondary | ICD-10-CM | POA: Diagnosis not present

## 2019-11-16 DIAGNOSIS — I5032 Chronic diastolic (congestive) heart failure: Secondary | ICD-10-CM | POA: Diagnosis not present

## 2019-11-16 DIAGNOSIS — R2689 Other abnormalities of gait and mobility: Secondary | ICD-10-CM | POA: Diagnosis not present

## 2019-11-16 DIAGNOSIS — M79604 Pain in right leg: Secondary | ICD-10-CM | POA: Diagnosis not present

## 2019-11-19 DIAGNOSIS — R278 Other lack of coordination: Secondary | ICD-10-CM | POA: Diagnosis not present

## 2019-11-19 DIAGNOSIS — R2689 Other abnormalities of gait and mobility: Secondary | ICD-10-CM | POA: Diagnosis not present

## 2019-11-19 DIAGNOSIS — D62 Acute posthemorrhagic anemia: Secondary | ICD-10-CM | POA: Diagnosis not present

## 2019-11-19 DIAGNOSIS — M79604 Pain in right leg: Secondary | ICD-10-CM | POA: Diagnosis not present

## 2019-11-19 DIAGNOSIS — I5032 Chronic diastolic (congestive) heart failure: Secondary | ICD-10-CM | POA: Diagnosis not present

## 2019-11-19 DIAGNOSIS — M6389 Disorders of muscle in diseases classified elsewhere, multiple sites: Secondary | ICD-10-CM | POA: Diagnosis not present

## 2019-11-23 DIAGNOSIS — M6389 Disorders of muscle in diseases classified elsewhere, multiple sites: Secondary | ICD-10-CM | POA: Diagnosis not present

## 2019-11-23 DIAGNOSIS — R2689 Other abnormalities of gait and mobility: Secondary | ICD-10-CM | POA: Diagnosis not present

## 2019-11-23 DIAGNOSIS — M79605 Pain in left leg: Secondary | ICD-10-CM | POA: Diagnosis not present

## 2019-11-23 DIAGNOSIS — I10 Essential (primary) hypertension: Secondary | ICD-10-CM | POA: Diagnosis not present

## 2019-11-23 DIAGNOSIS — M79604 Pain in right leg: Secondary | ICD-10-CM | POA: Diagnosis not present

## 2019-11-23 DIAGNOSIS — R601 Generalized edema: Secondary | ICD-10-CM | POA: Diagnosis not present

## 2019-11-23 DIAGNOSIS — R278 Other lack of coordination: Secondary | ICD-10-CM | POA: Diagnosis not present

## 2019-11-23 DIAGNOSIS — K922 Gastrointestinal hemorrhage, unspecified: Secondary | ICD-10-CM | POA: Diagnosis not present

## 2019-11-23 DIAGNOSIS — I480 Paroxysmal atrial fibrillation: Secondary | ICD-10-CM | POA: Diagnosis not present

## 2019-11-26 ENCOUNTER — Non-Acute Institutional Stay (SKILLED_NURSING_FACILITY): Payer: Medicare Other | Admitting: Adult Health

## 2019-11-26 ENCOUNTER — Encounter: Payer: Self-pay | Admitting: Adult Health

## 2019-11-26 DIAGNOSIS — K5901 Slow transit constipation: Secondary | ICD-10-CM

## 2019-11-26 DIAGNOSIS — K922 Gastrointestinal hemorrhage, unspecified: Secondary | ICD-10-CM

## 2019-11-26 DIAGNOSIS — R5381 Other malaise: Secondary | ICD-10-CM

## 2019-11-26 DIAGNOSIS — R7611 Nonspecific reaction to tuberculin skin test without active tuberculosis: Secondary | ICD-10-CM | POA: Diagnosis not present

## 2019-11-26 DIAGNOSIS — I480 Paroxysmal atrial fibrillation: Secondary | ICD-10-CM | POA: Diagnosis not present

## 2019-11-26 DIAGNOSIS — D61818 Other pancytopenia: Secondary | ICD-10-CM | POA: Diagnosis not present

## 2019-11-26 DIAGNOSIS — R634 Abnormal weight loss: Secondary | ICD-10-CM | POA: Diagnosis not present

## 2019-11-26 DIAGNOSIS — D649 Anemia, unspecified: Secondary | ICD-10-CM | POA: Diagnosis not present

## 2019-11-26 DIAGNOSIS — K121 Other forms of stomatitis: Secondary | ICD-10-CM

## 2019-11-26 DIAGNOSIS — I5032 Chronic diastolic (congestive) heart failure: Secondary | ICD-10-CM | POA: Diagnosis not present

## 2019-11-26 DIAGNOSIS — I10 Essential (primary) hypertension: Secondary | ICD-10-CM | POA: Diagnosis not present

## 2019-11-26 DIAGNOSIS — K1230 Oral mucositis (ulcerative), unspecified: Secondary | ICD-10-CM | POA: Diagnosis not present

## 2019-11-26 NOTE — Progress Notes (Addendum)
.  Location:  [facility]   Place of Service:  SNF (31) Provider:   Christy , ANP [facility] [phone]  Reed, Tiffany L, DO  Patient Care Team: Reed, Tiffany L, DO as PCP - General (Geriatric Medicine) Jordan, Peter M, MD as PCP - Cardiology (Cardiology) Jordan, Peter M, MD as Attending Physician (Cardiology) Community, [facility] Athar, Saima, MD as Consulting Physician (Neurology)  Extended Emergency Contact Information Primary Emergency Contact: Vaughan,Debra  [place] Home Phone: [phone] Mobile Phone: [phone] Relation: Relative Secondary Emergency Contact: Fain,Linda  [place] Home Phone: [phone] Mobile Phone: [phone] Relation: None  Code Status: DNR Goals of care: Advanced Directive information Advanced Directives 11/26/2019  Does Patient Have a Medical Advance Directive? Yes  Type of Advance Directive Healthcare Power of Attorney;Living will;Out of facility DNR (pink MOST or yellow form)  Does patient want to make changes to medical advance directive? No - Patient declined  Copy of Healthcare Power of Attorney in Chart? Yes - validated most recent copy scanned in chart (See row information)  Would patient like information on creating a medical advance directive? -  Pre-existing out of facility DNR order (yellow form or pink MOST form) -     Chief Complaint  Patient presents with  . Acute Visit    constipation and oral pain    HPI:  Pt is a 86 y.o. female seen today for complaints of constipation and oral pain. She was admitted on 11/25/19 due to weakness and [place] needs [place] [facility]. She has a hx of afib, arthritis, depression, smoking quite in 1985 with lung nodules (declined workup), GIB, HTN, insomnia, migraines, neuropathy, gout, ETOH abuse, low back pain,  and CHF.   She reports pain due to oral ulcerations. These were first  evaluated on 1/27 by Dr. Reed. ENT performed a bx of the tongue lesions which showed ulcerated and inflamed tissue with granulation and necroinflammatory debris. No malignancy (report 2/11).  She has tried lidocaine, magic mouth wash, cepecol lozenges with no benefit. Also tried a week of amoxicillin with no benefit. She is eating less and is losing weight. She is deconditioned and has more sob on exertion. No increased in edema or cp. OF note she was seen in the hospital for upper GIB which was thought to be due to eliquis and ibuprofen use (10/03/19-10/08/19).  EGD on 10/05/19 showed a single angiodysplastic lesion in the stomach s/p coagulation with argon plasma. Also esophagitis was noted and she was prescribed protonix.  Hgb was 4.9 on admission to the hospital and she did received a blood transfusion. Last Hgb was 9.3 on 1/28.  She is noted to have pancytopenia with a WBC of 1.8, plt 127.  There was a question of if she needed bone marrow bx but she declined and did not want a workup.   She reports constipation with only small bms each day and feels impacted. No abd pain, nausea, vomiting, or bleeding. At one time she said she weighed in the 190s but is now 183 lbs. She is drinking supplements as food is hard to chew due to the mouth ulcers which are not improving. During her hospitalization they checked HIV, B12, Hep C which were all not remarkable. She is on isolation here and awaiting results of a covid swab done for routine reasons.  Wt Readings from Last 3 Encounters:  11/26/19 183 lb (83 kg)  11/10/19 181 lb 4.8 oz (  82.2 kg)  10/25/19 188 lb 12.8 oz (85.6 kg)    Of note in July of 2020 she had a CT of the chest done where nodules were noted to both lungs. She declined any workup regarding this issue. She does not have any hemoptysis or chest pain. DOE noted but correlates with her weight loss and decreased intake.  Past Medical History:  Diagnosis Date  . A-fib (HCC)   . Abdominal pain,  unspecified site   . Abnormality of gait 06/02/2013  . Arthritis   . Cataracts, bilateral   . Depression   . Diverticulosis of colon (without mention of hemorrhage)   . Diverticulosis of colon (without mention of hemorrhage) 10/27/2012  . Edema 10/27/2012  . GI bleed 06/2002  . Hemorrhage of gastrointestinal tract, unspecified   . History of stomach ulcers   . Hypertension   . Insomnia, unspecified 10/27/2012  . Localized osteoarthrosis not specified whether primary or secondary, lower leg 05/31/2011  . Migraines   . Nausea alone 10/28/2012  . Obesity, unspecified 10/27/2012  . Osteoarthrosis, unspecified whether generalized or localized, lower leg   . Other acariasis    peripherial neuropathy  . Other and unspecified alcohol dependence, continuous drinking behavior 10/28/2012  . Palpitations   . PVC (premature ventricular contraction)   . Unspecified hereditary and idiopathic peripheral neuropathy   . UTI (lower urinary tract infection)    pt state uti w/ no pain   Past Surgical History:  Procedure Laterality Date  . ARTHROSCOPIC REPAIR ACL    . BREAST BIOPSY  1973   left  . BREAST REDUCTION SURGERY  12/23/2003  . cataracts    . CHOLECYSTECTOMY  2005  . ESOPHAGOGASTRODUODENOSCOPY (EGD) WITH PROPOFOL N/A 10/05/2019   Procedure: ESOPHAGOGASTRODUODENOSCOPY (EGD) WITH PROPOFOL;  Surgeon: Ganem, Salem F, MD;  Location: [facility];  Service: Endoscopy;  Laterality: N/A;  . HOT HEMOSTASIS N/A 10/05/2019   Procedure: HOT HEMOSTASIS (ARGON PLASMA COAGULATION/BICAP);  Surgeon: Ganem, Salem F, MD;  Location: [facility];  Service: Endoscopy;  Laterality: N/A;  . TONSILLECTOMY AND ADENOIDECTOMY  1941    Allergies  Allergen Reactions  . Nickel Other (See Comments)    inflammation  . Other Itching    Acrylic nails, and trace metals  . Prednisone Other (See Comments)    Causes sleep disturbances     Outpatient Encounter Medications as of 11/26/2019  Medication Sig  . acetaminophen (TYLENOL) 500  MG tablet Take 750-1,000 mg by mouth See admin instructions. Take 2 tablets (1000 mg) by mouth every morning and 1 1/2 tablets (750 mg) at night  . allopurinol (ZYLOPRIM) 100 MG tablet Take 1 tablet (100 mg total) by mouth daily.  . apixaban (ELIQUIS) 2.5 MG TABS tablet Take 2.5 mg by mouth 2 (two) times daily.  . flecainide (TAMBOCOR) 50 MG tablet TAKE 1 TABLET BY MOUTH TWICE DAILY FOR ATRIAL FIBRILLATION  . furosemide (LASIX) 40 MG tablet Take 1 tablet (40 mg total) by mouth daily.  . HYDROcodone-acetaminophen (NORCO/VICODIN) 5-325 MG tablet Take 1-2 tablets by mouth every 6 (six) hours as needed.  . lidocaine (XYLOCAINE) 2 % solution Use as directed 15 mLs in the mouth or throat every 4 (four) hours as needed for mouth pain.  . Melatonin 5 MG TABS Take 5 mg by mouth at bedtime.  . mirtazapine (REMERON) 7.5 MG tablet Take 1 tablet (7.5 mg total) by mouth at bedtime.  . nebivolol (BYSTOLIC) 10 MG tablet Take 1 tablet (10 mg total) by mouth daily.  .   ondansetron (ZOFRAN) 4 MG tablet Take 1 tablet (4 mg total) by mouth every 8 (eight) hours as needed for nausea or vomiting.  . pantoprazole (PROTONIX) 40 MG tablet Take 1 tablet (40 mg total) by mouth 2 (two) times daily.  . polyethylene glycol (MIRALAX / GLYCOLAX) 17 g packet Take 17 g by mouth daily.  Marland Kitchen triamcinolone (KENALOG) 0.1 % paste Use as directed 1 application in the mouth or throat 2 (two) times daily.  Marland Kitchen zolpidem (AMBIEN) 5 MG tablet Take 1 tablet (5 mg total) by mouth at bedtime.  . [DISCONTINUED] amoxicillin (AMOXIL) 500 MG capsule Take 500 mg by mouth 3 (three) times daily.  . [DISCONTINUED] magic mouthwash SOLN Take 5 ml 4 times a day for 7 days   No facility-administered encounter medications on file as of 11/26/2019.    Review of Systems  Constitutional: Positive for activity change, appetite change, fatigue and unexpected weight change. Negative for chills, diaphoresis and fever.  HENT: Positive for mouth sores, sore throat and  trouble swallowing. Negative for congestion, nosebleeds, postnasal drip, rhinorrhea, sinus pain, tinnitus and voice change.   Respiratory: Positive for shortness of breath. Negative for cough and wheezing.   Cardiovascular: Negative for chest pain, palpitations and leg swelling.  Gastrointestinal: Positive for constipation. Negative for abdominal distention, abdominal pain, blood in stool, diarrhea, nausea, rectal pain and vomiting.  Genitourinary: Negative for difficulty urinating, dysuria, hematuria, vaginal bleeding and vaginal discharge.  Musculoskeletal: Positive for gait problem (uses walker). Negative for arthralgias, back pain, joint swelling and myalgias.  Skin: Positive for pallor. Negative for rash.  Neurological: Positive for weakness. Negative for dizziness, tremors, seizures, syncope, facial asymmetry, speech difficulty, light-headedness, numbness and headaches.  Psychiatric/Behavioral: Negative for agitation, behavioral problems and confusion.    Immunization History  Administered Date(s) Administered  . Influenza Split 07/23/2013  . Influenza Whole 06/23/2012  . Influenza, High Dose Seasonal PF 07/19/2016, 05/23/2017, 07/02/2019  . Influenza,inj,Quad PF,6+ Mos 06/23/2015, 07/17/2018  . Influenza-Unspecified 07/11/2014  . Pneumococcal Conjugate-13 10/04/2015  . Pneumococcal Polysaccharide-23 09/23/2010  . Td 09/23/2010  . Zoster 09/23/1998   Pertinent  Health Maintenance Due  Topic Date Due  . INFLUENZA VACCINE  Completed  . DEXA SCAN  Completed  . PNA vac Low Risk Adult  Completed   Fall Risk  11/10/2019 10/20/2019 09/29/2019 08/27/2019 08/05/2019  Falls in the past year? 0 0 0 0 0  Comment - - - - -  Number falls in past yr: 0 0 0 - 0  Injury with Fall? 0 0 0 0 0  Comment - - - - -  Risk for fall due to : - - - - -  Follow up - - - - -   Functional Status Survey:    Vitals:   11/26/19 1011  BP: 127/68  Pulse: 75  Resp: 18  Temp: (!) 97.3 F (36.3 C)  SpO2:  90%  Weight: 183 lb (83 kg)   Body mass index is 27.83 kg/m. Physical Exam Vitals and nursing note reviewed.  Constitutional:      General: She is not in acute distress.    Appearance: She is not diaphoretic.  HENT:     Head: Normocephalic and atraumatic.     Nose: Nose normal. No congestion.     Mouth/Throat:     Mouth: Mucous membranes are moist.     Pharynx: No oropharyngeal exudate.     Comments: Multiple raised white patches to the tongue. NO redness or drainage. Mildly tender  to touch.  Eyes:     Conjunctiva/sclera: Conjunctivae normal.     Pupils: Pupils are equal, round, and reactive to light.  Neck:     Vascular: No JVD.  Cardiovascular:     Rate and Rhythm: Normal rate and regular rhythm.     Heart sounds: Murmur present.  Pulmonary:     Effort: Pulmonary effort is normal. No respiratory distress.     Breath sounds: Normal breath sounds. No wheezing.  Abdominal:     General: Bowel sounds are normal. There is no distension.     Palpations: Abdomen is soft. There is no mass.     Tenderness: There is no abdominal tenderness. There is no right CVA tenderness or left CVA tenderness.  Musculoskeletal:     Cervical back: Tenderness present. No rigidity.     Right lower leg: No edema.     Left lower leg: No edema.  Lymphadenopathy:     Cervical: No cervical adenopathy.  Skin:    General: Skin is warm and dry.     Coloration: Skin is pale.     Findings: Bruising present.  Neurological:     Mental Status: She is alert and oriented to person, place, and time.  Psychiatric:        Mood and Affect: Mood normal.     Labs reviewed: Recent Labs    10/05/19 1005 10/07/19 0237 10/21/19 1057  NA 137 136 139  K 3.8 3.6 3.8  CL 101 99 103  CO2 27 27 28  GLUCOSE 125* 109* 148*  BUN 14 9 11  CREATININE 0.91 0.99 0.81  CALCIUM 8.4* 8.3* 8.3*   Recent Labs    10/04/19 0550 10/04/19 0550 10/05/19 1005 10/07/19 0237 10/21/19 1057  AST 11*   < > 13* 13* 11  ALT  10   < > 11 11 6  ALKPHOS 55  --  52 57  --   BILITOT 1.0   < > 1.0 1.1 1.0  PROT 5.2*   < > 5.1* 5.1* 5.6*  ALBUMIN 3.1*  --  3.0* 2.9*  --    < > = values in this interval not displayed.   Recent Labs    10/06/19 0257 10/06/19 0257 10/07/19 0237 10/08/19 0910 10/21/19 1057  WBC 1.7*   < > 2.2* 2.0* 1.8*  NEUTROABS 0.9*  --  0.9*  --  677*  HGB 6.6*   < > 9.2* 9.6* 9.3*  HCT 21.1*   < > 27.6* 29.8* 28.5*  MCV 96.8   < > 91.1 93.1 94.1  PLT 128*   < > 119* 120* 127*   < > = values in this interval not displayed.   Lab Results  Component Value Date   TSH 1.215 06/05/2013   Lab Results  Component Value Date   HGBA1C 5.6 09/27/2019   Lab Results  Component Value Date   CHOL 113 09/27/2019   HDL 39 (L) 09/27/2019   LDLCALC 53 09/27/2019   TRIG 129 09/27/2019   CHOLHDL 2.9 09/27/2019    Significant Diagnostic Results in last 30 days:  No results found.  Assessment/Plan 1. Oral ulceration ? If this is due to possibly inflammatory bowel disease with the upper GIB or other inflammatory condition, however the CT of the abd done on 10/03/19 did not show any inflammatory process.  Will try triamcinolone paste 0.1% bid for 7 days. ? If she would benefit from prednisone but will hold off for now til we know   her hgb and other labs. Also may continue lidocaine and norco as needed for pain.  CBC BMP ESR CRP  2. Slow transit constipation Begin Miralax 17 grams qd with 6-8 oz fluid Give Dulcolax supp today if no bm try fleets enema  3. Weight loss Due to decreased intake and possible underlying etiology. Nutritional consult placed.   4. Pancytopenia ([place]) ? Due to ETOH vs allopurinol vs bone marrow disease vs inflammatory bowel disease. Declined bone marrow bx. Will recheck labs and if CBC continues to show abnormalities may consider discontinuing the allopurinol.   5. Upper GI bleed Continue protonix 40 mg bid. Avoid nsaids. Follow up cbc ordered  6. Chronic diastolic heart  failure (HCC) Compensated Continue Lasix 40 mg qd.   7. Essential hypertension Controlled. Continue current medications.   8. Paroxysmal atrial fibrillation (HCC) Continue Eliquis 2.5 mg bid and flecainide 50 mg bid   9. Physical deconditioning Due to decreased intake and anemia. PT and OT ordered.     Family/ staff Communication: discussed with the resident and nurse.   Labs/tests ordered:  CBC BMP ESR CRP

## 2019-11-27 DIAGNOSIS — I2510 Atherosclerotic heart disease of native coronary artery without angina pectoris: Secondary | ICD-10-CM | POA: Diagnosis not present

## 2019-11-27 DIAGNOSIS — K1230 Oral mucositis (ulcerative), unspecified: Secondary | ICD-10-CM | POA: Diagnosis not present

## 2019-11-27 DIAGNOSIS — M2550 Pain in unspecified joint: Secondary | ICD-10-CM | POA: Diagnosis not present

## 2019-11-27 DIAGNOSIS — E7800 Pure hypercholesterolemia, unspecified: Secondary | ICD-10-CM | POA: Diagnosis not present

## 2019-11-27 LAB — BASIC METABOLIC PANEL
BUN: 33 — AB (ref 4–21)
CO2: 27 — AB (ref 13–22)
Chloride: 94 — AB (ref 99–108)
Creatinine: 1 (ref 0.5–1.1)
Glucose: 163
Potassium: 4 (ref 3.4–5.3)
Sodium: 136 — AB (ref 137–147)

## 2019-11-27 LAB — CBC AND DIFFERENTIAL
HCT: 18 — AB (ref 36–46)
Hemoglobin: 6.1 — AB (ref 12.0–16.0)
Neutrophils Absolute: 0
Platelets: 109 — AB (ref 150–399)
WBC: 1.5

## 2019-11-27 LAB — POCT ERYTHROCYTE SEDIMENTATION RATE, NON-AUTOMATED: Sed Rate: 107

## 2019-11-27 LAB — CBC: RBC: 1.76 — AB (ref 3.87–5.11)

## 2019-11-29 ENCOUNTER — Encounter: Payer: Self-pay | Admitting: Adult Health

## 2019-11-29 ENCOUNTER — Non-Acute Institutional Stay (SKILLED_NURSING_FACILITY): Payer: Medicare Other | Admitting: Adult Health

## 2019-11-29 DIAGNOSIS — D61818 Other pancytopenia: Secondary | ICD-10-CM | POA: Diagnosis not present

## 2019-11-29 DIAGNOSIS — I480 Paroxysmal atrial fibrillation: Secondary | ICD-10-CM

## 2019-11-29 DIAGNOSIS — K5901 Slow transit constipation: Secondary | ICD-10-CM | POA: Diagnosis not present

## 2019-11-29 DIAGNOSIS — D649 Anemia, unspecified: Secondary | ICD-10-CM

## 2019-11-29 DIAGNOSIS — K121 Other forms of stomatitis: Secondary | ICD-10-CM

## 2019-11-29 DIAGNOSIS — Z9189 Other specified personal risk factors, not elsewhere classified: Secondary | ICD-10-CM | POA: Diagnosis not present

## 2019-11-29 DIAGNOSIS — K922 Gastrointestinal hemorrhage, unspecified: Secondary | ICD-10-CM

## 2019-11-29 DIAGNOSIS — D464 Refractory anemia, unspecified: Secondary | ICD-10-CM | POA: Diagnosis not present

## 2019-11-29 MED ORDER — OXYCODONE-ACETAMINOPHEN 5-325 MG PO TABS: 1.0000 | ORAL_TABLET | Freq: Four times a day (QID) | ORAL | 0 refills | Status: DC | PRN

## 2019-11-29 NOTE — Progress Notes (Signed)
Location:  Occupational psychologist of Service:  SNF (31) Provider:   Cindi Carbon, ANP [place] [phone]   Gayland Curry, DO  Patient Care Team: Gayland Curry, DO as PCP - General (Geriatric Medicine) Martinique, Peter M, MD as PCP - Cardiology (Cardiology) [place], Peter M, MD as Attending Physician (Cardiology) [facility] Rose[facility]ella [place], MD as Consulting Physician (Neurology)  Extended Emergency Contact Information Primary Emergency Contact: [place] Phone: [phone] Mobile Phone: [phone] Relation: Relative Secondary Emergency Contact: [place] of [place] Phone: [phone] Mobile Phone: [phone] Relation: None  Code Status:  DNR Goals of care: Advanced Directive information Advanced Directives 11/26/2019  Does Patient Have a Medical Advance Directive? Yes  Type of Paramedic of [place];Living will;Out of facility DNR (pink MOST or yellow form)  Does patient want to make changes to medical advance directive? No - Patient declined  Copy of [place] in Chart? Yes - validated most recent copy scanned in chart (See row information)  Would patient like information on creating a medical advance directive? -  Pre-existing out of facility DNR order (yellow form or pink MOST form) -     Chief Complaint  Patient presents with  . Acute Visit    anemia, oral pain    HPI:  Pt is a 86 y.o. female seen today for an acute visit for anemia and oral pain. She is currently in rehab at [facility] due to weakness, decreased intake, and oral pain. See note on 3/5.  She was placed on oxygen over the weekend due to sats in the high 80s.  Now she is 94% on 2 liters. She denies any sob, cough, cp, sputum production or fever. Her Hgb was 6.1 on 3/5 and it was recommended that she go to the ER but she declined.  We have tried  to set up an outpatient apt but could not get one until 3/17.   Oral lesion bx showed an inflammatory process. 11/26/19: ESR 107 CRP >5.31  She has tried norco and triamcinolone paste for oral pain and throat pain with ulceration without relief. She continues to take in only boost as solid food is   Also reports hard stools and needs more laxatives. No rectal bleeding noted. No abd pain or fever. Having small hard bms each day.   She is concerned about her bystolic causing her diastolic bp to be low in the 50's at this time with no dizziness or syncope.      Past Medical History:  Diagnosis Date  . A-fib ([place])   . Abdominal pain, unspecified site   . Abnormality of gait 06/02/2013  . Arthritis   . Cataracts, bilateral   . Depression   . Diverticulosis of colon (without mention of hemorrhage)   . Diverticulosis of colon (without mention of hemorrhage) 10/27/2012  . Edema 10/27/2012  . GI bleed 06/2002  . Hemorrhage of gastrointestinal tract, unspecified   . History of stomach ulcers   . Hypertension   . Insomnia, unspecified 10/27/2012  . Localized osteoarthrosis not specified whether primary or secondary, lower leg 05/31/2011  . Migraines   . Nausea alone 10/28/2012  . Obesity, unspecified 10/27/2012  . Osteoarthrosis, unspecified whether generalized or localized, lower leg   . Other acariasis    peripherial neuropathy  . Other and unspecified alcohol dependence, continuous drinking behavior 10/28/2012  . Palpitations   . PVC (  premature ventricular contraction)   . Unspecified hereditary and idiopathic peripheral neuropathy   . UTI (lower urinary tract infection)    pt state uti w/ no pain   Past Surgical History:  Procedure Laterality Date  . ARTHROSCOPIC REPAIR ACL    . BREAST BIOPSY  1973   left  . BREAST REDUCTION SURGERY  12/23/2003  . cataracts    . CHOLECYSTECTOMY  2005  . ESOPHAGOGASTRODUODENOSCOPY (EGD) WITH PROPOFOL N/A 10/05/2019   Procedure: ESOPHAGOGASTRODUODENOSCOPY (EGD)  WITH PROPOFOL;  Surgeon: Wonda Horner, MD;  Location: [facility] ENDOSCOPY;  Service: Endoscopy;  Laterality: N/A;  . HOT HEMOSTASIS N/A 10/05/2019   Procedure: HOT HEMOSTASIS (ARGON PLASMA COAGULATION/BICAP);  Surgeon: Wonda Horner, MD;  Location: [facility] ENDOSCOPY;  Service: Endoscopy;  Laterality: N/A;  . TONSILLECTOMY AND ADENOIDECTOMY  1941    Allergies  Allergen Reactions  . Nickel Other (See Comments)    inflammation  . Other Itching    Acrylic nails, and trace metals  . Prednisone Other (See Comments)    Causes sleep disturbances     Outpatient Encounter Medications as of 11/29/2019  Medication Sig  . nebivolol (BYSTOLIC) 5 MG tablet Take 5 mg by mouth daily.  Marland Kitchen oxyCODONE-acetaminophen (PERCOCET/ROXICET) 5-325 MG tablet Take 1-2 tablets by mouth every 6 (six) hours as needed for severe pain. 1 tab for moderate pain and 2 tabs for severe pain  . senna-docusate (SENOKOT-S) 8.6-50 MG tablet Take 2 tablets by mouth 2 (two) times daily.  . [DISCONTINUED] oxyCODONE-acetaminophen (PERCOCET/ROXICET) 5-325 MG tablet Take 1-2 tablets by mouth every 6 (six) hours as needed for severe pain. 1 tab for moderate pain and 2 tabs for severe pain  . acetaminophen (TYLENOL) 500 MG tablet Take 750-1,000 mg by mouth See admin instructions. Take 2 tablets (1000 mg) by mouth every morning and 1 1/2 tablets (750 mg) at night  . apixaban (ELIQUIS) 2.5 MG TABS tablet Take 2.5 mg by mouth 2 (two) times daily.  . flecainide (TAMBOCOR) 50 MG tablet TAKE 1 TABLET BY MOUTH TWICE DAILY FOR ATRIAL FIBRILLATION  . furosemide (LASIX) 40 MG tablet Take 1 tablet (40 mg total) by mouth daily.  Marland Kitchen lidocaine (XYLOCAINE) 2 % solution Use as directed 15 mLs in the mouth or throat every 4 (four) hours as needed for mouth pain.  . Melatonin 5 MG TABS Take 5 mg by mouth at bedtime.  . mirtazapine (REMERON) 7.5 MG tablet Take 1 tablet (7.5 mg total) by mouth at bedtime.  . ondansetron (ZOFRAN) 4 MG tablet Take 1 tablet (4 mg total) by  mouth every 8 (eight) hours as needed for nausea or vomiting.  . pantoprazole (PROTONIX) 40 MG tablet Take 1 tablet (40 mg total) by mouth 2 (two) times daily.  . polyethylene glycol (MIRALAX / GLYCOLAX) 17 g packet Take 17 g by mouth daily.  Marland Kitchen triamcinolone (KENALOG) 0.1 % paste Use as directed 1 application in the mouth or throat 2 (two) times daily.  Marland Kitchen zolpidem (AMBIEN) 5 MG tablet Take 1 tablet (5 mg total) by mouth at bedtime.  . [DISCONTINUED] allopurinol (ZYLOPRIM) 100 MG tablet Take 1 tablet (100 mg total) by mouth daily.  . [DISCONTINUED] HYDROcodone-acetaminophen (NORCO/VICODIN) 5-325 MG tablet Take 1-2 tablets by mouth every 6 (six) hours as needed.  . [DISCONTINUED] nebivolol (BYSTOLIC) 10 MG tablet Take 1 tablet (10 mg total) by mouth daily.   No facility-administered encounter medications on file as of 11/29/2019.    Review of Systems  Constitutional: Positive for activity  change, appetite change, fatigue and unexpected weight change. Negative for chills, diaphoresis and fever.  HENT: Positive for sore throat and trouble swallowing. Negative for congestion, postnasal drip, rhinorrhea, sinus pressure and sneezing.        Painful oral lesions.   Respiratory: Negative for cough, shortness of breath and wheezing.   Cardiovascular: Negative for chest pain, palpitations and leg swelling.  Gastrointestinal: Positive for constipation. Negative for abdominal distention, abdominal pain, anal bleeding, blood in stool, diarrhea, nausea and rectal pain.  Genitourinary: Negative for difficulty urinating, dysuria and hematuria.  Musculoskeletal: Positive for gait problem.  Skin: Negative for rash and wound.  Neurological: Positive for weakness.  Psychiatric/Behavioral: Negative for agitation, behavioral problems and confusion.    Immunization History  Administered Date(s) Administered  . Influenza Split 07/23/2013  . Influenza Whole 06/23/2012  . Influenza, High Dose Seasonal PF  07/19/2016, 05/23/2017, 07/02/2019  . Influenza,inj,Quad PF,6+ Mos 06/23/2015, 07/17/2018  . Influenza-Unspecified 07/11/2014  . Pneumococcal Conjugate-13 10/04/2015  . Pneumococcal Polysaccharide-23 09/23/2010  . Td 09/23/2010  . Zoster 09/23/1998   Pertinent  Health Maintenance Due  Topic Date Due  . INFLUENZA VACCINE  Completed  . DEXA SCAN  Completed  . PNA vac Low Risk Adult  Completed   Fall Risk  11/10/2019 10/20/2019 09/29/2019 08/27/2019 08/05/2019  Falls in the past year? 0 0 0 0 0  Comment - - - - -  Number falls in past yr: 0 0 0 - 0  Injury with Fall? 0 0 0 0 0  Comment - - - - -  Risk for fall due to : - - - - -  Follow up - - - - -   Functional Status Survey:    Vitals:   11/29/19 1136  BP: (!) 123/56  Pulse: 67  Resp: 20  Temp: 97.7 F (36.5 C)  SpO2: 94%   There is no height or weight on file to calculate BMI. Physical Exam Vitals and nursing note reviewed.  Constitutional:      General: She is not in acute distress.    Appearance: She is not diaphoretic.  HENT:     Head: Normocephalic and atraumatic.     Mouth/Throat:     Mouth: Mucous membranes are moist.     Pharynx: No oropharyngeal exudate.     Comments: White plague oral lesions noted to tongue. Painful to touch. Neck:     Vascular: No JVD.  Cardiovascular:     Rate and Rhythm: Normal rate and regular rhythm.     Heart sounds: No murmur.  Pulmonary:     Effort: Pulmonary effort is normal. No respiratory distress.     Breath sounds: Normal breath sounds. No wheezing.  Abdominal:     General: Bowel sounds are normal. There is no distension.     Palpations: Abdomen is soft.  Musculoskeletal:     Cervical back: No rigidity or tenderness.     Right lower leg: No edema.     Left lower leg: No edema.  Lymphadenopathy:     Cervical: No cervical adenopathy.  Skin:    General: Skin is warm and dry.     Coloration: Skin is pale.     Findings: Bruising (BUE) present.  Neurological:     Mental  Status: She is alert and oriented to person, place, and time.  Psychiatric:     Comments: angry     Labs reviewed: Recent Labs    10/05/19 1005 10/07/19 0237 10/21/19 1057  NA  137 136 139  K 3.8 3.6 3.8  CL 101 99 103  CO2 _0 GLUCOSE 125* 109* 148*  BUN _1 CREATININE 0.91 0.99 0.81  CALCIUM 8.4* 8.3* 8.3*   Recent Labs    10/04/19 0550 10/04/19 0550 10/05/19 1005 10/07/19 0237 10/21/19 1057  AST 11*   < > 13* 13* 11  ALT 10   < > _2 ALKPHOS 55  --  52 57  --   BILITOT 1.0   < > 1.0 1.1 1.0  PROT 5.2*   < > 5.1* 5.1* 5.6*  ALBUMIN 3.1*  --  3.0* 2.9*  --    < > = values in this interval not displayed.   Recent Labs    10/06/19 0257 10/06/19 0257 10/07/19 0237 10/08/19 0910 10/21/19 1057  WBC 1.7*   < > 2.2* 2.0* 1.8*  NEUTROABS 0.9*  --  0.9*  --  677*  HGB 6.6*   < > 9.2* 9.6* 9.3*  HCT 21.1*   < > 27.6* 29.8* 28.5*  MCV 96.8   < > 91.1 93.1 94.1  PLT 128*   < > 119* 120* 127*   < > = values in this interval not displayed.   Lab Results  Component Value Date   TSH 1.215 06/05/2013   Lab Results  Component Value Date   HGBA1C 5.6 09/27/2019   Lab Results  Component Value Date   CHOL 113 09/27/2019   HDL 39 (L) 09/27/2019   LDLCALC 53 09/27/2019   TRIG 129 09/27/2019   CHOLHDL 2.9 09/27/2019    Significant Diagnostic Results in last 30 days:  No results found.  Assessment/Plan 1. Oral ulceration Unclear etiology. ? IBD, Bechets, or other inflammatory process due to bx result and elevated ESR/CRP. Prednisone 40 mg qd x 4 days, 30 mg qd x 3 days, 20 mg qd x 2 days, 10 mg qd x 1 day then d/c Change norco to percocet due to unmet pain relief.  D/C tylenol   2. Anemia, unspecified type Needs transfusion but declined ER visit. Infusion clinic not available til 3/17.  She really does not want to go to the ER but I do not want her to wait until 3/17 due to her hgb and need for oxygen at this time. Will recheck CBC stat to verify  result before sending out again. If we can not get an apt sooner and her hgb is indeded 6.1 she will need to go to the ER.   3. Pancytopenia ([place]) Unclear etiology as she declined a bone marrow bx. Will stop allopurinol due to possible s/e.  4. Slow transit constipation Start Senna s 2 tab bid and continue miralax 17 grams qd   5. Paroxysmal atrial fibrillation ([facility]) Reduce Bystolic to 5 mg qd due to low diastolic  Continue flecainide for rate control. Off eliquis at this time due to dropping hgb.   6. Upper GI bleed No obvious bleeding at this point  Continue protonix 40 mg bid. Avoid nsaids. Continue to hold eliquis due to dropping hgb. Hemoccult x 3     Family/ staff Communication: resident and Dr. Mariea Clonts, along with coordination with staff at [facility]  Labs/tests ordered:  CBC stat

## 2019-11-30 ENCOUNTER — Other Ambulatory Visit: Payer: Self-pay

## 2019-11-30 ENCOUNTER — Encounter (HOSPITAL_COMMUNITY): Payer: Self-pay

## 2019-11-30 ENCOUNTER — Inpatient Hospital Stay (HOSPITAL_COMMUNITY)
Admission: EM | Admit: 2019-11-30 | Discharge: 2019-12-02 | DRG: 812 | Disposition: A | Discharge status: Skilled Nursing Facility | Payer: Medicare Other | Source: Skilled Nursing Facility | Attending: Internal Medicine | Admitting: Internal Medicine

## 2019-11-30 DIAGNOSIS — G4700 Insomnia, unspecified: Secondary | ICD-10-CM

## 2019-11-30 DIAGNOSIS — D61818 Other pancytopenia: Secondary | ICD-10-CM | POA: Diagnosis present

## 2019-11-30 DIAGNOSIS — R531 Weakness: Secondary | ICD-10-CM | POA: Diagnosis not present

## 2019-11-30 DIAGNOSIS — K121 Other forms of stomatitis: Secondary | ICD-10-CM | POA: Diagnosis present

## 2019-11-30 DIAGNOSIS — Z20822 Contact with and (suspected) exposure to covid-19: Secondary | ICD-10-CM | POA: Diagnosis present

## 2019-11-30 DIAGNOSIS — F329 Major depressive disorder, single episode, unspecified: Secondary | ICD-10-CM | POA: Diagnosis present

## 2019-11-30 DIAGNOSIS — Z8249 Family history of ischemic heart disease and other diseases of the circulatory system: Secondary | ICD-10-CM | POA: Diagnosis not present

## 2019-11-30 DIAGNOSIS — I5032 Chronic diastolic (congestive) heart failure: Secondary | ICD-10-CM | POA: Diagnosis present

## 2019-11-30 DIAGNOSIS — D649 Anemia, unspecified: Secondary | ICD-10-CM | POA: Diagnosis present

## 2019-11-30 DIAGNOSIS — Z9049 Acquired absence of other specified parts of digestive tract: Secondary | ICD-10-CM | POA: Diagnosis not present

## 2019-11-30 DIAGNOSIS — M1710 Unilateral primary osteoarthritis, unspecified knee: Secondary | ICD-10-CM | POA: Diagnosis present

## 2019-11-30 DIAGNOSIS — K209 Esophagitis, unspecified without bleeding: Secondary | ICD-10-CM | POA: Diagnosis present

## 2019-11-30 DIAGNOSIS — M2550 Pain in unspecified joint: Secondary | ICD-10-CM | POA: Diagnosis not present

## 2019-11-30 DIAGNOSIS — E669 Obesity, unspecified: Secondary | ICD-10-CM | POA: Diagnosis present

## 2019-11-30 DIAGNOSIS — M1990 Unspecified osteoarthritis, unspecified site: Secondary | ICD-10-CM | POA: Diagnosis present

## 2019-11-30 DIAGNOSIS — I480 Paroxysmal atrial fibrillation: Secondary | ICD-10-CM | POA: Diagnosis present

## 2019-11-30 DIAGNOSIS — R55 Syncope and collapse: Secondary | ICD-10-CM

## 2019-11-30 DIAGNOSIS — K219 Gastro-esophageal reflux disease without esophagitis: Secondary | ICD-10-CM | POA: Diagnosis present

## 2019-11-30 DIAGNOSIS — F32A Depression, unspecified: Secondary | ICD-10-CM | POA: Diagnosis present

## 2019-11-30 DIAGNOSIS — F419 Anxiety disorder, unspecified: Secondary | ICD-10-CM | POA: Diagnosis present

## 2019-11-30 DIAGNOSIS — Z87891 Personal history of nicotine dependence: Secondary | ICD-10-CM | POA: Diagnosis not present

## 2019-11-30 DIAGNOSIS — Z79891 Long term (current) use of opiate analgesic: Secondary | ICD-10-CM

## 2019-11-30 DIAGNOSIS — D500 Iron deficiency anemia secondary to blood loss (chronic): Secondary | ICD-10-CM | POA: Diagnosis present

## 2019-11-30 DIAGNOSIS — I469 Cardiac arrest, cause unspecified: Secondary | ICD-10-CM | POA: Diagnosis not present

## 2019-11-30 DIAGNOSIS — K921 Melena: Secondary | ICD-10-CM | POA: Diagnosis present

## 2019-11-30 DIAGNOSIS — Z79899 Other long term (current) drug therapy: Secondary | ICD-10-CM | POA: Diagnosis not present

## 2019-11-30 DIAGNOSIS — Z7952 Long term (current) use of systemic steroids: Secondary | ICD-10-CM

## 2019-11-30 DIAGNOSIS — Z91048 Other nonmedicinal substance allergy status: Secondary | ICD-10-CM

## 2019-11-30 DIAGNOSIS — Z8711 Personal history of peptic ulcer disease: Secondary | ICD-10-CM | POA: Diagnosis not present

## 2019-11-30 DIAGNOSIS — I10 Essential (primary) hypertension: Secondary | ICD-10-CM | POA: Diagnosis present

## 2019-11-30 DIAGNOSIS — Z7401 Bed confinement status: Secondary | ICD-10-CM | POA: Diagnosis not present

## 2019-11-30 DIAGNOSIS — K5909 Other constipation: Secondary | ICD-10-CM | POA: Diagnosis present

## 2019-11-30 DIAGNOSIS — R0902 Hypoxemia: Secondary | ICD-10-CM | POA: Diagnosis not present

## 2019-11-30 DIAGNOSIS — I110 Hypertensive heart disease with heart failure: Secondary | ICD-10-CM | POA: Diagnosis present

## 2019-11-30 DIAGNOSIS — D7589 Other specified diseases of blood and blood-forming organs: Secondary | ICD-10-CM | POA: Diagnosis present

## 2019-11-30 DIAGNOSIS — I959 Hypotension, unspecified: Secondary | ICD-10-CM | POA: Diagnosis not present

## 2019-11-30 DIAGNOSIS — Z6827 Body mass index (BMI) 27.0-27.9, adult: Secondary | ICD-10-CM

## 2019-11-30 DIAGNOSIS — D539 Nutritional anemia, unspecified: Secondary | ICD-10-CM | POA: Diagnosis present

## 2019-11-30 LAB — URINALYSIS, ROUTINE W REFLEX MICROSCOPIC
Bacteria, UA: NONE SEEN
Bilirubin Urine: NEGATIVE
Glucose, UA: NEGATIVE mg/dL
Hgb urine dipstick: NEGATIVE
Ketones, ur: NEGATIVE mg/dL
Nitrite: NEGATIVE
Protein, ur: NEGATIVE mg/dL
Specific Gravity, Urine: 1.008 (ref 1.005–1.030)
pH: 7 (ref 5.0–8.0)

## 2019-11-30 LAB — COMPREHENSIVE METABOLIC PANEL
ALT: 13 U/L (ref 0–44)
AST: 18 U/L (ref 15–41)
Albumin: 3.6 g/dL (ref 3.5–5.0)
Alkaline Phosphatase: 71 U/L (ref 38–126)
Anion gap: 13 (ref 5–15)
BUN: 25 mg/dL — ABNORMAL HIGH (ref 8–23)
CO2: 27 mmol/L (ref 22–32)
Calcium: 9.4 mg/dL (ref 8.9–10.3)
Chloride: 96 mmol/L — ABNORMAL LOW (ref 98–111)
Creatinine, Ser: 0.97 mg/dL (ref 0.44–1.00)
GFR calc Af Amer: >60 mL/min (ref >60)
GFR calc non Af Amer: 54 mL/min — ABNORMAL LOW (ref >60)
Glucose, Bld: 194 mg/dL — ABNORMAL HIGH (ref 70–99)
Potassium: 4 mmol/L (ref 3.5–5.1)
Sodium: 136 mmol/L (ref 135–145)
Total Bilirubin: 0.8 mg/dL (ref 0.3–1.2)
Total Protein: 6.3 g/dL — ABNORMAL LOW (ref 6.5–8.1)

## 2019-11-30 LAB — APTT: aPTT: 32 seconds (ref 24–36)

## 2019-11-30 LAB — PROTIME-INR
INR: 1.1 (ref 0.8–1.2)
INR: 1.1 (ref 0.8–1.2)
Prothrombin Time: 13.8 seconds (ref 11.4–15.2)
Prothrombin Time: 14.3 seconds (ref 11.4–15.2)

## 2019-11-30 LAB — VITAMIN B12: Vitamin B-12: 1454 pg/mL — ABNORMAL HIGH (ref 180–914)

## 2019-11-30 LAB — CBC
HCT: 17.6 % — ABNORMAL LOW (ref 36.0–46.0)
Hemoglobin: 5.7 g/dL — CL (ref 12.0–15.0)
MCH: 34.8 pg — ABNORMAL HIGH (ref 26.0–34.0)
MCHC: 32.4 g/dL (ref 30.0–36.0)
MCV: 107.3 fL — ABNORMAL HIGH (ref 80.0–100.0)
Platelets: 130 10*3/uL — ABNORMAL LOW (ref 150–400)
RBC: 1.64 MIL/uL — ABNORMAL LOW (ref 3.87–5.11)
RDW: 24 % — ABNORMAL HIGH (ref 11.5–15.5)
WBC: 1.3 10*3/uL — CL (ref 4.0–10.5)
nRBC: 3 % — ABNORMAL HIGH (ref 0.0–0.2)

## 2019-11-30 LAB — MAGNESIUM: Magnesium: 1.8 mg/dL (ref 1.7–2.4)

## 2019-11-30 LAB — IRON AND TIBC
Iron: 178 ug/dL — ABNORMAL HIGH (ref 28–170)
Saturation Ratios: 53 % — ABNORMAL HIGH (ref 10.4–31.8)
TIBC: 333 ug/dL (ref 250–450)
UIBC: 155 ug/dL

## 2019-11-30 LAB — PREPARE RBC (CROSSMATCH)

## 2019-11-30 LAB — FERRITIN: Ferritin: 158 ng/mL (ref 11–307)

## 2019-11-30 LAB — TSH: TSH: 1.348 u[IU]/mL (ref 0.350–4.500)

## 2019-11-30 LAB — POC OCCULT BLOOD, ED: Fecal Occult Bld: NEGATIVE

## 2019-11-30 LAB — SARS CORONAVIRUS 2 (TAT 6-24 HRS): SARS Coronavirus 2: NEGATIVE

## 2019-11-30 LAB — ETHANOL: Alcohol, Ethyl (B): <10 mg/dL (ref <10)

## 2019-11-30 MED ORDER — ONDANSETRON HCL 4 MG PO TABS: 4.0000 mg | ORAL_TABLET | Freq: Four times a day (QID) | ORAL | Status: DC | PRN

## 2019-11-30 MED ORDER — FLECAINIDE ACETATE 50 MG PO TABS
50.0000 mg | ORAL_TABLET | Freq: Two times a day (BID) | ORAL | Status: DC
  Administered 2019-11-30 – 2019-12-02 (×4): 50 mg via ORAL
  Filled 2019-11-30 (×4): qty 1

## 2019-11-30 MED ORDER — PANTOPRAZOLE SODIUM 40 MG PO TBEC
40.0000 mg | DELAYED_RELEASE_TABLET | Freq: Two times a day (BID) | ORAL | Status: DC
  Administered 2019-11-30 – 2019-12-02 (×4): 40 mg via ORAL
  Filled 2019-11-30 (×4): qty 1

## 2019-11-30 MED ORDER — ACETAMINOPHEN 325 MG PO TABS: 650.0000 mg | ORAL_TABLET | Freq: Four times a day (QID) | ORAL | Status: DC | PRN

## 2019-11-30 MED ORDER — MIRTAZAPINE 15 MG PO TABS
7.5000 mg | ORAL_TABLET | Freq: Every day | ORAL | Status: DC
  Administered 2019-11-30 – 2019-12-01 (×2): 7.5 mg via ORAL
  Filled 2019-11-30 (×2): qty 1

## 2019-11-30 MED ORDER — ACETAMINOPHEN 650 MG RE SUPP: 650.0000 mg | Freq: Four times a day (QID) | RECTAL | Status: DC | PRN

## 2019-11-30 MED ORDER — ONDANSETRON HCL 4 MG/2ML IJ SOLN: 4.0000 mg | Freq: Four times a day (QID) | INTRAMUSCULAR | Status: DC | PRN

## 2019-11-30 MED ORDER — PREDNISONE 20 MG PO TABS: 20.0000 mg | ORAL_TABLET | ORAL | Status: DC

## 2019-11-30 MED ORDER — ZOLPIDEM TARTRATE 5 MG PO TABS
5.0000 mg | ORAL_TABLET | Freq: Every day | ORAL | Status: DC
  Administered 2019-11-30 – 2019-12-01 (×2): 5 mg via ORAL
  Filled 2019-11-30 (×2): qty 1

## 2019-11-30 MED ORDER — PREDNISONE 20 MG PO TABS
40.0000 mg | ORAL_TABLET | Freq: Every day | ORAL | Status: AC
  Administered 2019-12-01 – 2019-12-02 (×2): 40 mg via ORAL
  Filled 2019-11-30 (×2): qty 2

## 2019-11-30 MED ORDER — MELATONIN 3 MG PO TABS
6.0000 mg | ORAL_TABLET | Freq: Every day | ORAL | Status: DC
  Administered 2019-11-30 – 2019-12-01 (×2): 6 mg via ORAL
  Filled 2019-11-30 (×2): qty 2

## 2019-11-30 MED ORDER — FUROSEMIDE 40 MG PO TABS
40.0000 mg | ORAL_TABLET | Freq: Every day | ORAL | Status: DC
  Administered 2019-12-01 – 2019-12-02 (×2): 40 mg via ORAL
  Filled 2019-11-30: qty 2
  Filled 2019-11-30: qty 1

## 2019-11-30 MED ORDER — OXYCODONE-ACETAMINOPHEN 5-325 MG PO TABS
1.0000 | ORAL_TABLET | Freq: Four times a day (QID) | ORAL | Status: DC | PRN
  Administered 2019-11-30 – 2019-12-01 (×2): 2 via ORAL
  Administered 2019-12-01 (×3): 1 via ORAL
  Administered 2019-12-01 – 2019-12-02 (×2): 2 via ORAL
  Filled 2019-11-30 (×3): qty 2
  Filled 2019-11-30: qty 1
  Filled 2019-11-30: qty 2
  Filled 2019-11-30 (×2): qty 1

## 2019-11-30 MED ORDER — SODIUM CHLORIDE 0.9 % IV SOLN: 10.0000 mL/h | Freq: Once | INTRAVENOUS | Status: DC

## 2019-11-30 MED ORDER — TRIAMCINOLONE ACETONIDE 0.1 % MT PSTE
1.0000 "application " | PASTE | Freq: Two times a day (BID) | OROMUCOSAL | Status: DC
  Administered 2019-12-01 – 2019-12-02 (×4): 1 via OROMUCOSAL
  Filled 2019-11-30 (×2): qty 5

## 2019-11-30 MED ORDER — SENNOSIDES-DOCUSATE SODIUM 8.6-50 MG PO TABS
2.0000 | ORAL_TABLET | Freq: Two times a day (BID) | ORAL | Status: DC
  Administered 2019-11-30 – 2019-12-02 (×4): 2 via ORAL
  Filled 2019-11-30 (×4): qty 2

## 2019-11-30 MED ORDER — MAGIC MOUTHWASH W/LIDOCAINE
5.0000 mL | Freq: Four times a day (QID) | ORAL | Status: DC
  Administered 2019-11-30 – 2019-12-02 (×6): 5 mL via ORAL
  Filled 2019-11-30 (×10): qty 5

## 2019-11-30 MED ORDER — PHENOL 1.4 % MT LIQD
1.0000 | OROMUCOSAL | Status: DC | PRN
  Administered 2019-12-01: 01:00:00 1 via OROMUCOSAL
  Filled 2019-11-30 (×2): qty 177

## 2019-11-30 MED ORDER — PREDNISONE 20 MG PO TABS: 30.0000 mg | ORAL_TABLET | Freq: Every day | ORAL | Status: DC

## 2019-11-30 MED ORDER — BISACODYL 10 MG RE SUPP: 10.0000 mg | RECTAL | Status: DC | PRN

## 2019-11-30 MED ORDER — PREDNISONE 10 MG PO TABS: 10.0000 mg | ORAL_TABLET | Freq: Every day | ORAL | Status: DC

## 2019-11-30 MED ORDER — PREDNISONE 20 MG PO TABS: 20.0000 mg | ORAL_TABLET | Freq: Every day | ORAL | Status: DC

## 2019-11-30 MED ORDER — NEBIVOLOL HCL 10 MG PO TABS
10.0000 mg | ORAL_TABLET | Freq: Every day | ORAL | Status: DC
  Administered 2019-12-01 – 2019-12-02 (×2): 10 mg via ORAL
  Filled 2019-11-30 (×2): qty 1

## 2019-11-30 MED ORDER — POLYETHYLENE GLYCOL 3350 17 G PO PACK
17.0000 g | PACK | Freq: Every day | ORAL | Status: DC
  Administered 2019-12-01 – 2019-12-02 (×2): 17 g via ORAL
  Filled 2019-11-30 (×2): qty 1

## 2019-11-30 NOTE — Progress Notes (Addendum)
HEMATOLOGY-ONCOLOGY PROGRESS NOTE  SUBJECTIVE: Ms. [name] presented to the emergency room from the skilled nursing facility secondary to anemia.  She was previously seen by hematology for pancytopenia during her last hospitalization in January 2021.  Causes of her pancytopenia were thought to be related to inflammation versus infection versus alcohol abuse versus a bone marrow disorder.  Her biggest issue today is ongoing ulcers in her mouth since her prior discharge.  Reports that she has been treated with 2 rounds of amoxicillin with really no improvement.  States that these hurt all the time.  She has not noticed any recent bleeding and stool for occult blood was checked here in the ER and was negative.  She reports some shortness of breath with exertion.  She reports that she bruises easily simply when brushing her skin up against something.  The patient is somewhat upset about being admitted and wants to go back to the skilled facility.  REVIEW OF SYSTEMS:   Constitutional: Denies fevers, chills  Eyes: Denies blurriness of vision Ears, nose, mouth, throat, and face: Mouth ulcers which have been present for over a month Respiratory: Reports shortness of breath with exertion Cardiovascular: Denies palpitation, chest discomfort Gastrointestinal:  Denies nausea, heartburn or change in bowel habits Skin: Denies abnormal skin rashes Lymphatics: Bruises easily. Neurological:Denies numbness, tingling or new weaknesses Behavioral/Psych: Mood is stable, no new changes  Extremities: No lower extremity edema All other systems were reviewed with the patient and are negative.  I have reviewed the past medical history, past surgical history, social history and family history with the patient and they are unchanged from previous note.   PHYSICAL EXAMINATION:  Vitals:   11/30/19 1545 11/30/19 1600  BP: (!) 154/53 (!) 142/58  Pulse: 80 78  Resp: (!) 21 20  Temp:    SpO2: 91% 94%   Filed Weights   11/30/19 1211  Weight: 81.2 kg    Intake/Output from previous day: No intake/output data recorded.  GENERAL:alert, no distress and comfortable SKIN: Multiple ecchymoses over her bilateral arms EYES: normal, Conjunctiva are pink and non-injected, sclera clear OROPHARYNX: Leukoplakia noted on both sides of her tongue LUNGS: clear to auscultation and percussion with normal breathing effort HEART: regular rate & rhythm and no murmurs and no lower extremity edema ABDOMEN:abdomen soft, non-tender and normal bowel sounds Musculoskeletal:no cyanosis of digits and no clubbing  NEURO: alert & oriented x 3 with fluent speech, no focal motor/sensory deficits  LABORATORY DATA:  I have reviewed the data as listed CMP Latest Ref Rng & Units 11/30/2019 11/27/2019 10/21/2019  Glucose 70 - 99 mg/dL 194(H) - 148(H)  BUN 8 - 23 mg/dL 25(H) 33(A) 11  Creatinine 0.44 - 1.00 mg/dL 0.97 1.0 0.81  Sodium 135 - 145 mmol/L 136 136(A) 139  Potassium 3.5 - 5.1 mmol/L 4.0 4.0 3.8  Chloride 98 - 111 mmol/L 96(L) 94(A) 103  CO2 22 - 32 mmol/L 27 27(A) 28  Calcium 8.9 - 10.3 mg/dL 9.4 - 8.3(L)  Total Protein 6.5 - 8.1 g/dL 6.3(L) - 5.6(L)  Total Bilirubin 0.3 - 1.2 mg/dL 0.8 - 1.0  Alkaline Phos 38 - 126 U/L 71 - -  AST 15 - 41 U/L 18 - 11  ALT 0 - 44 U/L 13 - 6    Lab Results  Component Value Date   WBC 1.3 (LL) 11/30/2019   HGB 5.7 (LL) 11/30/2019   HCT 17.6 (L) 11/30/2019   MCV 107.3 (H) 11/30/2019   PLT 130 (L) 11/30/2019  NEUTROABS 0 11/27/2019    No results found.  ASSESSMENT AND PLAN: 1.  Pancytopenia 2.  Oral ulcers of unclear etiology -prior biopsy showed inflammation 3.  Paroxysmal atrial fibrillation 4.  Grade 2 diastolic congestive heart failure 5.  Hypertension 6.  History of GI bleed  -CBC results have been discussed with the patient.  She has macrocytic anemia with neutropenia and mild thrombocytopenia.  Recommend PRBC transfusion to keep her hemoglobin above 8.  Discussed proceeding  with a bone marrow biopsy for further evaluation due to concern for MDS.  The patient is very clear that she does not want to proceed with a bone marrow biopsy given her age and other comorbid conditions.  However, she is agreeable to close monitoring at the cancer center with routine lab work and transfusions as needed to avoid having to come to the ER when she developed symptomatic anemia.  We will arrange for outpatient follow-up at the cancer center. -For her oral ulcerations, will order Magic mouthwash.   LOS: 0 days   Mikey Bussing, DNP, AGPCNP-BC, AOCNP 11/30/19  Addendum  I have seen the patient, examined her. I agree with the assessment and and plan and have edited the notes.   Chart reviewed, pt was last seen by our service Dr. Lindi Adie 2 months ago during last admission for GI bleeding and anemia, but has not been followed in our clinic. 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 No evidence of B12 or folate deficiency. TSH normal. I think the etiology of her macrocytosis is not clear, severe degree of anemia is also out of portion of her GI bleeding, plus her leukopenia and thormbocytopenia, I recommend a bone marrow biopsy. However pt clearly declined BM biopsy, but open to supportive care. I will set up her lab, f/u and blood transfusion as needed in our office. I will inform Dr. Lindi Adie.   Truitt Merle  11/30/2019

## 2019-11-30 NOTE — ED Provider Notes (Addendum)
[facility] EMERGENCY DEPARTMENT Provider Note   CSN: [id number] Arrival date & time: 11/30/19  1211     History Chief Complaint  Patient presents with  . Anemia    [name] is a 86 y.o. female.  Patient with hx afib, anemia, presents from [facility] via EMS with general weakness, lightheaded when stands, and worsening anemia with hemoglobin 6 on recheck labs. Patient notes symptoms gradual onset a week ago, moderate, persistent, felt worse today, worse when standing or with activity. No syncope, trauma or fall. No chest pain. Dyspnea w exertion. No abd pain or nvd. Denies any recent blood loss, vaginal bleeding, rectal bleeding or melena. Notes indicate pt with pancytopenia in past few months - no prior eval/bone marrow - pts allopurinol was recently discontinued as was her eliquis.   The history is provided by the patient and the EMS personnel.  Anemia Pertinent negatives include no chest pain, no abdominal pain, no headaches and no shortness of breath.       Past Medical History:  Diagnosis Date  . A-fib ([place])   . Abdominal pain, unspecified site   . Abnormality of gait 06/02/2013  . Arthritis   . Cataracts, bilateral   . Depression   . Diverticulosis of colon (without mention of hemorrhage)   . Diverticulosis of colon (without mention of hemorrhage) 10/27/2012  . Edema 10/27/2012  . GI bleed 06/2002  . Hemorrhage of gastrointestinal tract, unspecified   . History of stomach ulcers   . Hypertension   . Insomnia, unspecified 10/27/2012  . Localized osteoarthrosis not specified whether primary or secondary, lower leg 05/31/2011  . Migraines   . Nausea alone 10/28/2012  . Obesity, unspecified 10/27/2012  . Osteoarthrosis, unspecified whether generalized or localized, lower leg   . Other acariasis    peripherial neuropathy  . Other and unspecified alcohol dependence, continuous drinking behavior 10/28/2012  . Palpitations   . PVC (premature ventricular contraction)    . Unspecified hereditary and idiopathic peripheral neuropathy   . UTI (lower urinary tract infection)    pt state uti w/ no pain    Patient Active Problem List   Diagnosis Date Noted  . Symptomatic anemia 11/30/2019  . Pancytopenia ([place]) 11/30/2019  . Macrocytosis 11/30/2019  . Neutropenia ([place])   . GI bleeding 10/03/2019  . Abnormal CT of the chest 04/19/2019  . Chronic rhinitis 11/30/2018  . Aortic atherosclerosis ([place]) 08/26/2018  . Thoracic aortic aneurysm without rupture ([place]) 08/26/2018  . Chronic diastolic heart failure ([place]) 12/10/2017  . Urinary incontinence, mixed 08/18/2017  . Hypercoagulable state due to atrial fibrillation ([place]) 08/06/2017  . Major depressive disorder with single episode, in full remission ([place]) 08/06/2017  . Gastroesophageal reflux disease 04/29/2017  . Diarrhea of presumed infectious origin 04/29/2017  . Lumbar paraspinal muscle spasm 05/20/2016  . Pruritus 11/08/2013  . Hx of peptic ulcer 06/13/2013  . Hyperglycemia 06/07/2013  . DJD (degenerative joint disease) 06/07/2013  . Hypokalemia 06/06/2013  . Abnormality of gait 06/02/2013  . Unintentional weight loss 03/29/2013  . Cough, persistent 03/29/2013  . Tremor 01/11/2013  . Nausea 01/11/2013  . Depression   . Alcohol dependence, continuous ([place]) 10/28/2012  . Insomnia 10/27/2012  . Paroxysmal atrial fibrillation ([facility])   . PVC (premature ventricular contraction)   . Essential hypertension 10/08/2012    Past Surgical History:  Procedure Laterality Date  . ARTHROSCOPIC REPAIR ACL    . BREAST BIOPSY  1973   left  . BREAST REDUCTION SURGERY  12/23/2003  . cataracts    . CHOLECYSTECTOMY  2005  . ESOPHAGOGASTRODUODENOSCOPY (EGD) WITH PROPOFOL N/A 10/05/2019   Procedure: ESOPHAGOGASTRODUODENOSCOPY (EGD) WITH PROPOFOL;  Surgeon: Wonda Horner, MD;  Location: [facility] ENDOSCOPY;  Service: Endoscopy;  Laterality: N/A;  . HOT HEMOSTASIS N/A 10/05/2019   Procedure: HOT HEMOSTASIS (ARGON PLASMA  COAGULATION/BICAP);  Surgeon: Wonda Horner, MD;  Location: [facility] ENDOSCOPY;  Service: Endoscopy;  Laterality: N/A;  . TONSILLECTOMY AND ADENOIDECTOMY  1941     OB History    Gravida  0   Para  0   Term  0   Preterm  0   AB  0   Living  0     SAB  0   TAB  0   Ectopic  0   Multiple  0   Live Births  0           Family History  Problem Relation Age of Onset  . Breast cancer Mother   . Hypertension Mother   . Hypertension Father   . Allergic rhinitis Neg Hx   . Asthma Neg Hx     Social History   Tobacco Use  . Smoking status: Former Smoker    Packs/day: 1.00    Years: 30.00    Pack years: 30.00    Types: Cigarettes    Quit date: 10/09/1983    Years since quitting: 36.1  . Smokeless tobacco: Never Used  Substance Use Topics  . Alcohol use: Yes    Alcohol/week: 1.0 - 2.0 standard drinks    Types: 1 - 2 Shots of liquor per week    Comment: 2 DRINKS A DAY - daily  scotch   . Drug use: No    Home Medications Prior to Admission medications   Medication Sig Start Date End Date Taking? Authorizing Provider  acetaminophen (TYLENOL) 500 MG tablet Take 750-1,000 mg by mouth See admin instructions. Take two tablets by mouth every morning then take 1 1/2 tablet at night   Yes [provider]  allopurinol (ZYLOPRIM) 100 MG tablet Take 100 mg by mouth daily.   Yes [provider]  bisacodyl (DULCOLAX) 10 MG suppository Place 10 mg rectally as needed for moderate constipation.   Yes [provider]  flecainide (TAMBOCOR) 50 MG tablet TAKE 1 TABLET BY MOUTH TWICE DAILY FOR ATRIAL FIBRILLATION 06/29/19  Yes Reed, Tiffany L, DO  furosemide (LASIX) 40 MG tablet Take 1 tablet (40 mg total) by mouth daily. 10/20/19  Yes Reed, Tiffany L, DO  HYDROcodone-acetaminophen (NORCO/VICODIN) 5-325 MG tablet Take 2 tablets by mouth every 6 (six) hours as needed for moderate pain.   Yes [provider]  Melatonin 5 MG TABS Take 5 mg by mouth at  bedtime.   Yes [provider]  mirtazapine (REMERON) 7.5 MG tablet Take 1 tablet (7.5 mg total) by mouth at bedtime. 11/10/19  Yes Reed, Tiffany L, DO  nebivolol (BYSTOLIC) 10 MG tablet Take 10 mg by mouth daily.   Yes [provider]  ondansetron (ZOFRAN) 4 MG tablet Take 1 tablet (4 mg total) by mouth every 8 (eight) hours as needed for nausea or vomiting. 10/20/19  Yes Reed, Tiffany L, DO  oxyCODONE-acetaminophen (PERCOCET/ROXICET) 5-325 MG tablet Take 1-2 tablets by mouth every 6 (six) hours as needed for severe pain. 1 tab for moderate pain and 2 tabs for severe pain 11/29/19  Yes Wert, Margreta Journey, NP  pantoprazole (PROTONIX) 40 MG tablet Take 1 tablet (40 mg total) by  mouth 2 (two) times daily. 10/08/19 10/07/20 Yes Nita Sells, MD  phenol (CHLORASEPTIC) 1.4 % LIQD Use as directed 1 spray in the mouth or throat as needed for throat irritation / pain.   Yes [provider]  polyethylene glycol (MIRALAX / GLYCOLAX) 17 g packet Take 17 g by mouth daily.   Yes [provider]  predniSONE (DELTASONE) 20 MG tablet Take 20 mg by mouth See admin instructions. Give 40mg  everyday X4 days, then 30mg  qday X3days , then 20mg  qd X2days then 10mg  qd X1 then d/c (Take w/ food)   Yes [provider]  senna-docusate (SENOKOT-S) 8.6-50 MG tablet Take 2 tablets by mouth 2 (two) times daily.   Yes [provider]  sodium chloride (OCEAN) 0.65 % SOLN nasal spray Place 1 spray into both nostrils 3 (three) times daily as needed for congestion.   Yes [provider]  triamcinolone (KENALOG) 0.1 % paste Use as directed 1 application in the mouth or throat 2 (two) times daily.   Yes [provider]  zolpidem (AMBIEN) 5 MG tablet Take 1 tablet (5 mg total) by mouth at bedtime. 09/29/19  Yes Reed, Tiffany L, DO  lidocaine (XYLOCAINE) 2 % solution Use as directed 15 mLs in the mouth or throat every 4 (four) hours as needed for mouth pain. Patient not  taking: Reported on 11/30/2019 10/20/19   Hollace Kinnier L, DO    Allergies    Nickel and Other  Review of Systems   Review of Systems  Constitutional: Negative for fever.  HENT: Negative for sore throat.   Eyes: Negative for redness.  Respiratory: Negative for cough and shortness of breath.   Cardiovascular: Negative for chest pain.  Gastrointestinal: Negative for abdominal pain, blood in stool and vomiting.  Genitourinary: Negative for flank pain and vaginal bleeding.  Musculoskeletal: Negative for back pain and neck pain.  Skin: Negative for rash.  Neurological: Positive for weakness and light-headedness. Negative for headaches.  Hematological: Does not bruise/bleed easily.  Psychiatric/Behavioral: Negative for confusion.    Physical Exam Updated Vital Signs BP (!) 147/60   Pulse 81   Temp 98.4 F (36.9 C)   Resp 16   Ht 1.829 m (6')   Wt 81.2 kg   LMP  (LMP Unknown)   SpO2 95%   BMI 24.28 kg/m   Physical Exam Vitals and nursing note reviewed.  Constitutional:      Appearance: Normal appearance. She is well-developed.  HENT:     Head: Atraumatic.     Nose: Nose normal.     Mouth/Throat:     Mouth: Mucous membranes are moist.  Eyes:     General: No scleral icterus.    Conjunctiva/sclera: Conjunctivae normal.     Pupils: Pupils are equal, round, and reactive to light.  Neck:     Trachea: No tracheal deviation.  Cardiovascular:     Rate and Rhythm: Normal rate and regular rhythm.     Pulses: Normal pulses.     Heart sounds: Normal heart sounds. No murmur. No friction rub. No gallop.   Pulmonary:     Effort: Pulmonary effort is normal. No respiratory distress.     Breath sounds: Normal breath sounds.  Abdominal:     General: Bowel sounds are normal. There is no distension.     Palpations: Abdomen is soft.     Tenderness: There is no abdominal tenderness. There is no guarding.  Genitourinary:    Comments: No cva tenderness. Brown stool,  heme negative.   Musculoskeletal:        General: No swelling.     Cervical back: Normal range of motion and neck supple. No rigidity. No muscular tenderness.  Skin:    General: Skin is warm and dry.     Findings: No rash.  Neurological:     Mental Status: She is alert.     Comments: Alert, speech normal.   Psychiatric:        Mood and Affect: Mood normal.     ED Results / Procedures / Treatments   Labs (all labs ordered are listed, but only abnormal results are displayed) Results for orders placed or performed during the hospital encounter of 11/30/19  Comprehensive metabolic panel  Result Value Ref Range   Sodium 136 135 - 145 mmol/L   Potassium 4.0 3.5 - 5.1 mmol/L   Chloride 96 (L) 98 - 111 mmol/L   CO2 27 22 - 32 mmol/L   Glucose, Bld 194 (H) 70 - 99 mg/dL   BUN 25 (H) 8 - 23 mg/dL   Creatinine, Ser 0.97 0.44 - 1.00 mg/dL   Calcium 9.4 8.9 - 10.3 mg/dL   Total Protein 6.3 (L) 6.5 - 8.1 g/dL   Albumin 3.6 3.5 - 5.0 g/dL   AST 18 15 - 41 U/L   ALT 13 0 - 44 U/L   Alkaline Phosphatase 71 38 - 126 U/L   Total Bilirubin 0.8 0.3 - 1.2 mg/dL   GFR calc non Af Amer 54 (L) >60 mL/min   GFR calc Af Amer >60 >60 mL/min   Anion gap 13 5 - 15  CBC  Result Value Ref Range   WBC 1.3 (LL) 4.0 - 10.5 K/uL   RBC 1.64 (L) 3.87 - 5.11 MIL/uL   Hemoglobin 5.7 (LL) 12.0 - 15.0 g/dL   HCT 17.6 (L) 36.0 - 46.0 %   MCV 107.3 (H) 80.0 - 100.0 fL   MCH 34.8 (H) 26.0 - 34.0 pg   MCHC 32.4 30.0 - 36.0 g/dL   RDW 24.0 (H) 11.5 - 15.5 %   Platelets 130 (L) 150 - 400 K/uL   nRBC 3.0 (H) 0.0 - 0.2 %  Protime-INR - (order if Patient is taking Coumadin / Warfarin)  Result Value Ref Range   Prothrombin Time 13.8 11.4 - 15.2 seconds   INR 1.1 0.8 - 1.2  POC occult blood, ED  Result Value Ref Range   Fecal Occult Bld NEGATIVE NEGATIVE  Type and screen [place]  Result Value Ref Range   ABO/RH(D) O NEG    Antibody Screen NEG    Sample Expiration 12/03/2019,2359    Unit Number  [id number]    Blood Component Type RED CELLS,LR    Unit division 00    Status of Unit ISSUED    Transfusion Status OK TO TRANSFUSE    Crossmatch Result      Compatible Performed at [facility], [address] [place], [place] [address]    Unit Number [id number]    Blood Component Type RED CELLS,LR    Unit division 00    Status of Unit ALLOCATED    Transfusion Status OK TO TRANSFUSE    Crossmatch Result Compatible   Prepare RBC  Result Value Ref Range   Order Confirmation      ORDER PROCESSED BY BLOOD BANK Performed at [facility], [place] [address], [place], [place] [address]   BPAM RBC  Result Value Ref Range  ISSUE DATE / TIME [id number]    Blood Product Unit Number [id number]    PRODUCT CODE [id number]    Unit Type and Rh 9500    Blood Product Expiration Date GS:2702325    Blood Product Unit Number [id number]    PRODUCT CODE [id number]    Unit Type and Rh 9500    Blood Product Expiration Date GS:2702325     EKG EKG Interpretation  Date/Time:  Tuesday November 30 2019 12:12:53 EST Ventricular Rate:  73 PR Interval:  230 QRS Duration: 136 QT Interval:  410 QTC Calculation: 451 R Axis:   -41 Text Interpretation: Sinus rhythm with 1st degree A-V block Left axis deviation Left bundle branch block No significant change since last tracing Confirmed by Lajean Saver [phone]) on 11/30/2019 1:35:36 PM   Radiology No results found.  Procedures Procedures (including critical care time)  Medications Ordered in ED Medications  0.9 %  sodium chloride infusion (10 mL/hr Intravenous Not Given 11/30/19 1404)  acetaminophen (TYLENOL) tablet 650 mg (has no administration in time range)    Or  acetaminophen (TYLENOL) suppository 650 mg (has no administration in time range)  ondansetron (ZOFRAN) tablet 4 mg (has no administration in time range)    Or  ondansetron (ZOFRAN) injection 4 mg (has no administration in time range)  oxyCODONE-acetaminophen  (PERCOCET/ROXICET) 5-325 MG per tablet 1-2 tablet (has no administration in time range)  nebivolol (BYSTOLIC) tablet 10 mg (has no administration in time range)  mirtazapine (REMERON) tablet 7.5 mg (has no administration in time range)  zolpidem (AMBIEN) tablet 5 mg (has no administration in time range)  predniSONE (DELTASONE) tablet 20 mg (has no administration in time range)  bisacodyl (DULCOLAX) suppository 10 mg (has no administration in time range)  polyethylene glycol (MIRALAX / GLYCOLAX) packet 17 g (has no administration in time range)  senna-docusate (Senokot-S) tablet 2 tablet (has no administration in time range)  Melatonin TABS 5 mg (has no administration in time range)  phenol (CHLORASEPTIC) mouth spray 1 spray (has no administration in time range)  triamcinolone (KENALOG) 0.1 % paste 1 application (has no administration in time range)  flecainide (TAMBOCOR) tablet 50 mg (has no administration in time range)  furosemide (LASIX) tablet 40 mg (has no administration in time range)    ED Course  I have reviewed the triage vital signs and the nursing notes.  Pertinent labs & imaging results that were available during my care of the patient were reviewed by me and considered in my medical decision making (see chart for details).    MDM Rules/Calculators/A&P                      Iv ns. Continuous pulse ox and monitor. o2 [place]. Stat labs.   Reviewed nursing notes and prior charts for additional history. Prior hgb 6.  Labs reviewed/interpreted by me - hgb very low, 5.7. wbc and plt also low.   Transfuse 2 units prbcs.   Hospitalists consulted for admission. Discussed pt - they will admit, and request hem consult. Hem consulted.   1530, no call back from hematology - will re-page.   Recheck pt, abd soft nt, feels sl improved.   CRITICAL CARE RE: severe, symptomatic anemia requiring emergent transfusion prbc, pancytopenia.  Performed by: Mirna Mires Total critical care  time: 40 minutes Critical care time was exclusive of separately billable procedures and treating other patients. Critical care was necessary to treat or prevent imminent or life-threatening deterioration. Critical care  was time spent personally by me on the following activities: development of treatment plan with patient and/or surrogate as well as nursing, discussions with consultants, evaluation of patient's response to treatment, examination of patient, obtaining history from patient or surrogate, ordering and performing treatments and interventions, ordering and review of laboratory studies, ordering and review of radiographic studies, pulse oximetry and re-evaluation of patient's condition.  Discussed pt hematology - she indicates she will place some additional lab orders, will consult on patient, and discuss possible BM biopsy w pt.    Final Clinical Impression(s) / ED Diagnoses Final diagnoses:  Symptomatic anemia  Pancytopenia ([place])  Near syncope    Rx / DC Orders ED Discharge Orders    None         Lajean Saver, MD 11/30/19 1551

## 2019-11-30 NOTE — H&P (Addendum)
History and Physical    [name] DXI:[id number] DOB: 05/20/1933 DOA: 11/30/2019  PCP: Gayland Curry, DO  Patient coming from: [facility] skilled nursing facility I have personally briefly reviewed patient's old medical records in [place]  Chief Complaint: Generalized weakness  HPI: [name] is a 86 y.o. female with medical history significant of A. fib with RVR on Eliquis, depression/anxiety, history of upper GI bleed, congestive heart failure, chronic constipation, hypertension, diverticulosis presents to ER due to generalized weakness, dizziness.  During my encounter: Patient was upset about she being admitted.  Patient tells me that she admitted in January with melena and she had upper GI endoscopy and since then she had oral ulcers which is not going away.  She thinks that her oral ulcers is due to unsanitized scope.  She does not want to be admitted and wishes to go to SNF.  I discussed with her in details regarding risks to go SNF with her low hemoglobin and pancytopenia and subsequently she agreed for the admission.  Patient was on Eliquis due to A. fib which was held couple of days ago at SNF due to pancytopenia.  She denies history of hematemesis, melena, hematuria, hemoptysis, epistaxis, abdominal pain, headache, chest pain, shortness of breath, palpitation.  Reports nausea, chronic constipation and decreased appetite due to oral ulcers.  She tells me that she has been using Chloraseptic oral spray to help with oral ulcers.    She had upper GI endoscopy on 10/05/2019 which showed esophagitis. She had oral lesion biopsy which showed an inflammatory process.  ESR elevated at 107, CRP: >5.31.  Patient started on tapering dose of prednisone at SNF yesterday and Norco was switched to Percocet for oral pain.    No history of smoking, illicit drug use however drinks 2 ounces of alcohol every night.  Please note that patient also has left upper lobe nodule for which she did  not want any intervention.  ED Course: Upon arrival: Patient's vital signs stable, WBC: 1.3, hemoglobin 5.7, platelet count 130, occult blood negative, she is afebrile.  EDP consulted oncology for further evaluation.  [facility] hospitalist consulted for the admission.    Review of Systems: As per HPI otherwise negative.    Past Medical History:  Diagnosis Date  . A-fib ([place])   . Abdominal pain, unspecified site   . Abnormality of gait 06/02/2013  . Arthritis   . Cataracts, bilateral   . Depression   . Diverticulosis of colon (without mention of hemorrhage)   . Diverticulosis of colon (without mention of hemorrhage) 10/27/2012  . Edema 10/27/2012  . GI bleed 06/2002  . Hemorrhage of gastrointestinal tract, unspecified   . History of stomach ulcers   . Hypertension   . Insomnia, unspecified 10/27/2012  . Localized osteoarthrosis not specified whether primary or secondary, lower leg 05/31/2011  . Migraines   . Nausea alone 10/28/2012  . Obesity, unspecified 10/27/2012  . Osteoarthrosis, unspecified whether generalized or localized, lower leg   . Other acariasis    peripherial neuropathy  . Other and unspecified alcohol dependence, continuous drinking behavior 10/28/2012  . Palpitations   . PVC (premature ventricular contraction)   . Unspecified hereditary and idiopathic peripheral neuropathy   . UTI (lower urinary tract infection)    pt state uti w/ no pain    Past Surgical History:  Procedure Laterality Date  . ARTHROSCOPIC REPAIR ACL    . BREAST BIOPSY  1973   left  . BREAST REDUCTION  SURGERY  12/23/2003  . cataracts    . CHOLECYSTECTOMY  2005  . ESOPHAGOGASTRODUODENOSCOPY (EGD) WITH PROPOFOL N/A 10/05/2019   Procedure: ESOPHAGOGASTRODUODENOSCOPY (EGD) WITH PROPOFOL;  Surgeon: Wonda Horner, MD;  Location: [facility] ENDOSCOPY;  Service: Endoscopy;  Laterality: N/A;  . HOT HEMOSTASIS N/A 10/05/2019   Procedure: HOT HEMOSTASIS (ARGON PLASMA COAGULATION/BICAP);  Surgeon: Wonda Horner, MD;   Location: [facility] ENDOSCOPY;  Service: Endoscopy;  Laterality: N/A;  . TONSILLECTOMY AND ADENOIDECTOMY  1941     reports that she quit smoking about 36 years ago. Her smoking use included cigarettes. She has a 30.00 pack-year smoking history. She has never used smokeless tobacco. She reports current alcohol use of about 1.0 - 2.0 standard drinks of alcohol per week. She reports that she does not use drugs.  Allergies  Allergen Reactions  . Nickel Other (See Comments)    inflammation  . Other Itching    Acrylic nails, and trace metals    Family History  Problem Relation Age of Onset  . Breast cancer Mother   . Hypertension Mother   . Hypertension Father   . Allergic rhinitis Neg Hx   . Asthma Neg Hx     Prior to Admission medications   Medication Sig Start Date End Date Taking? Authorizing Provider  acetaminophen (TYLENOL) 500 MG tablet Take 750-1,000 mg by mouth See admin instructions. Take two tablets by mouth every morning then take 1 1/2 tablet at night   Yes [provider]  allopurinol (ZYLOPRIM) 100 MG tablet Take 100 mg by mouth daily.   Yes [provider]  bisacodyl (DULCOLAX) 10 MG suppository Place 10 mg rectally as needed for moderate constipation.   Yes [provider]  flecainide (TAMBOCOR) 50 MG tablet TAKE 1 TABLET BY MOUTH TWICE DAILY FOR ATRIAL FIBRILLATION 06/29/19  Yes Reed, Tiffany L, DO  furosemide (LASIX) 40 MG tablet Take 1 tablet (40 mg total) by mouth daily. 10/20/19  Yes Reed, Tiffany L, DO  HYDROcodone-acetaminophen (NORCO/VICODIN) 5-325 MG tablet Take 2 tablets by mouth every 6 (six) hours as needed for moderate pain.   Yes [provider]  Melatonin 5 MG TABS Take 5 mg by mouth at bedtime.   Yes [provider]  mirtazapine (REMERON) 7.5 MG tablet Take 1 tablet (7.5 mg total) by mouth at bedtime. 11/10/19  Yes Reed, Tiffany L, DO  nebivolol (BYSTOLIC) 10 MG tablet Take 10 mg by mouth daily.   Yes [provider]  ondansetron (ZOFRAN) 4 MG tablet Take 1 tablet (4 mg total) by mouth every 8 (eight) hours as needed for nausea or vomiting. 10/20/19  Yes Reed, Tiffany L, DO  oxyCODONE-acetaminophen (PERCOCET/ROXICET) 5-325 MG tablet Take 1-2 tablets by mouth every 6 (six) hours as needed for severe pain. 1 tab for moderate pain and 2 tabs for severe pain 11/29/19  Yes Wert, Margreta Journey, NP  pantoprazole (PROTONIX) 40 MG tablet Take 1 tablet (40 mg total) by mouth 2 (two) times daily. 10/08/19 10/07/20 Yes Nita Sells, MD  phenol (CHLORASEPTIC) 1.4 % LIQD Use as directed 1 spray in the mouth or throat as needed for throat irritation / pain.   Yes [provider]  polyethylene glycol (MIRALAX / GLYCOLAX) 17 g packet Take 17 g by mouth daily.   Yes [provider]  predniSONE (DELTASONE) 20 MG tablet Take 20 mg by mouth See admin instructions. Give 15m everyday X4 days, then 365mqday X3days , then 2054md X2days then 29m30m X1  then d/c (Take w/ food)   Yes [provider]  senna-docusate (SENOKOT-S) 8.6-50 MG tablet Take 2 tablets by mouth 2 (two) times daily.   Yes [provider]  sodium chloride (OCEAN) 0.65 % SOLN nasal spray Place 1 spray into both nostrils 3 (three) times daily as needed for congestion.   Yes [provider]  triamcinolone (KENALOG) 0.1 % paste Use as directed 1 application in the mouth or throat 2 (two) times daily.   Yes [provider]  zolpidem (AMBIEN) 5 MG tablet Take 1 tablet (5 mg total) by mouth at bedtime. 09/29/19  Yes Reed, Tiffany L, DO  lidocaine (XYLOCAINE) 2 % solution Use as directed 15 mLs in the mouth or throat every 4 (four) hours as needed for mouth pain. Patient not taking: Reported on 11/30/2019 10/20/19   Gayland Curry, DO    Physical Exam: Vitals:   11/30/19 1445 11/30/19 1455 11/30/19 1500 11/30/19 1515  BP: (!) 135/43 (!) 135/43 (!) 138/59 (!) 147/60  Pulse: 76 77 78 81  Resp: '13 16 16 16  ' Temp:  98.1 F  (36.7 C)  98.4 F (36.9 C)  TempSrc:      SpO2: 93% 94% 95%   Weight:      Height:        Constitutional: NAD, calm, comfortable Eyes: PERRL, lids and conjunctivae: Pale ENMT: Mucous membranes are moist. Posterior pharynx clear of any exudate or lesions.Normal dentition.  Leukoplakia noted on both sides of her tongue. Neck: normal, supple, no masses, no thyromegaly Respiratory: clear to auscultation bilaterally, no wheezing, no crackles. Normal respiratory effort. No accessory muscle use.  Cardiovascular: Regular rate and rhythm, no murmurs / rubs / gallops. No extremity edema. 2+ pedal pulses. No carotid bruits.  Abdomen: no tenderness, no masses palpated. No hepatosplenomegaly. Bowel sounds positive.  Musculoskeletal: no clubbing / cyanosis. No joint deformity upper and lower extremities. Good ROM, no contractures. Normal muscle tone.  Skin: n multiple bruises noted on both arms  neurologic: CN 2-12 grossly intact. Sensation intact, DTR normal. Strength 5/5 in all 4.  Psychiatric: Normal judgment and insight. Alert and oriented x 3. Normal mood.    Labs on Admission: I have personally reviewed following labs and imaging studies  CBC: Recent Labs  Lab 11/27/19 0000 11/30/19 1237  WBC 1.5 1.3*  NEUTROABS 0  --   HGB 6.1* 5.7*  HCT 18* 17.6*  MCV  --  107.3*  PLT 109* 122*   Basic Metabolic Panel: Recent Labs  Lab 11/27/19 0000 11/30/19 1237  NA 136* 136  K 4.0 4.0  CL 94* 96*  CO2 27* 27  GLUCOSE  --  194*  BUN 33* 25*  CREATININE 1.0 0.97  CALCIUM  --  9.4   GFR: Estimated Creatinine Clearance: 49.8 mL/min (by C-G formula based on SCr of 0.97 mg/dL). Liver Function Tests: Recent Labs  Lab 11/30/19 1237  AST 18  ALT 13  ALKPHOS 71  BILITOT 0.8  PROT 6.3*  ALBUMIN 3.6   No results for input(s): LIPASE, AMYLASE in the last 168 hours. No results for input(s): AMMONIA in the last 168 hours. Coagulation Profile: Recent Labs  Lab 11/30/19 1237  INR 1.1    Cardiac Enzymes: No results for input(s): CKTOTAL, CKMB, CKMBINDEX, TROPONINI in the last 168 hours. BNP (last 3 results) No results for input(s): PROBNP in the last 8760 hours. HbA1C: No results for input(s): HGBA1C in the last 72 hours. CBG: No results for input(s):  GLUCAP in the last 168 hours. Lipid Profile: No results for input(s): CHOL, HDL, LDLCALC, TRIG, CHOLHDL, LDLDIRECT in the last 72 hours. Thyroid Function Tests: No results for input(s): TSH, T4TOTAL, FREET4, T3FREE, THYROIDAB in the last 72 hours. Anemia Panel: No results for input(s): VITAMINB12, FOLATE, FERRITIN, TIBC, IRON, RETICCTPCT in the last 72 hours. Urine analysis:    Component Value Date/Time   COLORURINE YELLOW 08/08/2018 1159   APPEARANCEUR CLOUDY (A) 08/08/2018 1159   LABSPEC <1.005 (L) 08/08/2018 1159   PHURINE 6.0 08/08/2018 1159   GLUCOSEU NEGATIVE 08/08/2018 1159   HGBUR SMALL (A) 08/08/2018 1159   BILIRUBINUR NEGATIVE 08/08/2018 1159   BILIRUBINUR Neg 09/08/2017 1557   KETONESUR NEGATIVE 08/08/2018 1159   PROTEINUR NEGATIVE 08/08/2018 1159   UROBILINOGEN 0.2 09/08/2017 1557   UROBILINOGEN 0.2 12/02/2014 1242   NITRITE POSITIVE (A) 08/08/2018 1159   LEUKOCYTESUR TRACE (A) 08/08/2018 1159    Radiological Exams on Admission: No results found.  EKG: Sinus rhythm with 1st degree A-V block, Left axis deviation, Left bundle branch block. No significant change since last tracing  Assessment/Plan Principal Problem:   Symptomatic anemia Active Problems:   Essential hypertension   Paroxysmal atrial fibrillation (HCC)   Depression   DJD (degenerative joint disease)   Chronic diastolic CHF (congestive heart failure) (HCC)   Pancytopenia (HCC)   Macrocytosis   Chronic constipation   Esophagitis    Symptomatic anemia: -Unknown underlying etiology?  No melena. -Patient presented with generalized weakness, dizziness. -Hemoglobin 5.7.  Patient received 1 unit of PRBC in ED.  Hemoccult is  negative. -Monitor H&H closely.  Transfuse as needed.  Pancytopenia: -Unknown etiology, could be bone marrow suppression versus alcohol abuse? -EDP consulted heme oncologist-await recommendation. -We will check ANA, dsDNA.  Oral ulcers: -Unknown etiology.  Patient also have pancytopenia-could be autoimmune process-IBD? -Continue Chloraseptic liquid. -Continue prednisone tapering dose.  Paroxysmal A. fib: Rate controlled -Hold Eliquis for now.  Continue flecainide and Bystolic.  On telemetry.  Monitor heart rate closely.  Grade 2 diastolic congestive heart failure: -Reviewed echo from 09/22/2019.  Preserved ejection fraction of 60 to 65% -Continue Lasix.  Strict INO's and daily weight. -Monitor electrolytes.  Hypertension: Stable -Continue Bystolic, Lasix, monitor blood pressure closely  Macrocytosis: -Could be due to chronic alcohol use -Check TSH, B12, folate  History of upper GI bleed: Reviewed upper GI endoscopy from 10/05/2019 which showed esophagitis.  Continue PPI twice daily.  Avoid NSAIDs.  Depression/anxiety: Continue Remeron and Ambien  Tonic constipation: Continue home meds.  DVT prophylaxis: TED/SCD, no chemical prophylaxis due to low hemoglobin  code Status: Full code  family Communication: None present at bedside.  Plan of care discussed with patient in length and she verbalized understanding and agreed with it. Disposition Plan: To be determined Consults called: Heme oncology by EDP  admission status: Inpatient  Mckinley Jewel MD [facility] Pager [phone]  If 7PM-7AM, please contact night-coverage [email or web address] Password [facility]  11/30/2019, 4:03 PM

## 2019-11-30 NOTE — ED Triage Notes (Signed)
Pt from [place] with PTAR due to hgb of 5.8. Pt pale, c.o feeling weak all over. VSS. Pt a.o

## 2019-12-01 LAB — COMPREHENSIVE METABOLIC PANEL
ALT: 11 U/L (ref 0–44)
AST: 17 U/L (ref 15–41)
Albumin: 3.1 g/dL — ABNORMAL LOW (ref 3.5–5.0)
Alkaline Phosphatase: 60 U/L (ref 38–126)
Anion gap: 9 (ref 5–15)
BUN: 27 mg/dL — ABNORMAL HIGH (ref 8–23)
CO2: 29 mmol/L (ref 22–32)
Calcium: 9.1 mg/dL (ref 8.9–10.3)
Chloride: 99 mmol/L (ref 98–111)
Creatinine, Ser: 1 mg/dL (ref 0.44–1.00)
GFR calc Af Amer: 60 mL/min — ABNORMAL LOW (ref >60)
GFR calc non Af Amer: 52 mL/min — ABNORMAL LOW (ref >60)
Glucose, Bld: 117 mg/dL — ABNORMAL HIGH (ref 70–99)
Potassium: 3.8 mmol/L (ref 3.5–5.1)
Sodium: 137 mmol/L (ref 135–145)
Total Bilirubin: 1 mg/dL (ref 0.3–1.2)
Total Protein: 5.5 g/dL — ABNORMAL LOW (ref 6.5–8.1)

## 2019-12-01 LAB — CBC
HCT: 21.7 % — ABNORMAL LOW (ref 36.0–46.0)
Hemoglobin: 7.3 g/dL — ABNORMAL LOW (ref 12.0–15.0)
MCH: 33 pg (ref 26.0–34.0)
MCHC: 33.6 g/dL (ref 30.0–36.0)
MCV: 98.2 fL (ref 80.0–100.0)
Platelets: 104 10*3/uL — ABNORMAL LOW (ref 150–400)
RBC: 2.21 MIL/uL — ABNORMAL LOW (ref 3.87–5.11)
RDW: 21.7 % — ABNORMAL HIGH (ref 11.5–15.5)
WBC: 1.4 10*3/uL — CL (ref 4.0–10.5)
nRBC: 2.8 % — ABNORMAL HIGH (ref 0.0–0.2)

## 2019-12-01 LAB — HEMOGLOBIN AND HEMATOCRIT, BLOOD
HCT: 28.5 % — ABNORMAL LOW (ref 36.0–46.0)
Hemoglobin: 9.6 g/dL — ABNORMAL LOW (ref 12.0–15.0)

## 2019-12-01 LAB — ANTI-DNA ANTIBODY, DOUBLE-STRANDED: ds DNA Ab: <1 IU/mL (ref 0–9)

## 2019-12-01 LAB — FOLATE RBC
Folate, Hemolysate: 179 ng/mL
Folate, RBC: 909 ng/mL (ref >498)
Hematocrit: 19.7 % — ABNORMAL LOW (ref 34.0–46.6)

## 2019-12-01 LAB — MRSA PCR SCREENING: MRSA by PCR: NEGATIVE

## 2019-12-01 LAB — ANA: Anti Nuclear Antibody (ANA): NEGATIVE

## 2019-12-01 LAB — PREPARE RBC (CROSSMATCH)

## 2019-12-01 MED ORDER — SODIUM CHLORIDE 0.9% IV SOLUTION: Freq: Once | INTRAVENOUS | Status: DC

## 2019-12-01 MED ORDER — ENSURE ENLIVE PO LIQD
237.0000 mL | Freq: Two times a day (BID) | ORAL | Status: DC
  Administered 2019-12-01 – 2019-12-02 (×2): 237 mL via ORAL

## 2019-12-01 MED ORDER — BOOST / RESOURCE BREEZE PO LIQD CUSTOM: 1.0000 | Freq: Three times a day (TID) | ORAL | Status: DC

## 2019-12-01 NOTE — TOC Initial Note (Signed)
Transition of Care [facility]) - Initial/Assessment Note    Patient Details  Name: [name] MRN: [id number] Date of Birth: December 16, 1932  [facility]) CM/SW Contact:    Alberteen Sam, LCSW Phone Number: 12/01/2019, 1:51 PM  Clinical Narrative:                  CSW met with patient at bedside for discharge planning, she reports plan is to return to [facility] and states no family to update on dc plan. No questions or concerns at this time.   CSW has reached out to [facility] to inform of possible discharge tomorrow.   Expected Discharge Plan: [place] Barriers to Discharge: Continued Medical Work up   Patient Goals and CMS Choice Patient states their goals for this hospitalization and ongoing recovery are:: to go back to [employer].gov Compare Post Acute Care list provided to:: Patient Choice offered to / list presented to : Patient  Expected Discharge Plan and Services Expected Discharge Plan: [place] Acute Care Choice: [place] Living arrangements for the past 2 months: Skilled Nursing Facility(from [facility])                                      Prior Living Arrangements/Services Living arrangements for the past 2 months: Skilled Nursing Facility(from [facility]) Lives with:: Self Patient language and need for interpreter reviewed:: Yes        Need for Family Participation in Patient Care: No (Comment) Care giver support system in place?: Yes (comment)   Criminal Activity/Legal Involvement Pertinent to Current Situation/Hospitalization: No - Comment as needed  Activities of Daily Living Home Assistive Devices/Equipment: Cane (specify quad or straight), Wheelchair ADL Screening (condition at time of admission) Patient's cognitive ability adequate to safely complete daily activities?: Yes Is the patient deaf or have difficulty hearing?: No Does the patient have difficulty  seeing, even when wearing glasses/contacts?: No Does the patient have difficulty concentrating, remembering, or making decisions?: No Patient able to express need for assistance with ADLs?: Yes Does the patient have difficulty dressing or bathing?: No Independently performs ADLs?: Yes (appropriate for developmental age) Does the patient have difficulty walking or climbing stairs?: Yes Weakness of Legs: Both Weakness of Arms/Hands: None  Permission Sought/Granted Permission sought to share information with : Case Manager, Investment banker, corporate granted to share info w AGENCY: [facility]        Emotional Assessment Appearance:: Appears stated age Attitude/Demeanor/Rapport: Gracious Affect (typically observed): Calm Orientation: : Oriented to Self, Oriented to Place, Oriented to  Time, Oriented to Situation Alcohol / Substance Use: Not Applicable Psych Involvement: No (comment)  Admission diagnosis:  Near syncope [R55] Esophagitis [K20.90] Pancytopenia ([place]) [D61.818] Symptomatic anemia [D64.9] Patient Active Problem List   Diagnosis Date Noted  . Symptomatic anemia 11/30/2019  . Pancytopenia ([place]) 11/30/2019  . Macrocytosis 11/30/2019  . Chronic constipation 11/30/2019  . Esophagitis 11/30/2019  . Neutropenia ([place])   . GI bleeding 10/03/2019  . Abnormal CT of the chest 04/19/2019  . Chronic rhinitis 11/30/2018  . Aortic atherosclerosis ([place]) 08/26/2018  . Thoracic aortic aneurysm without rupture ([place]) 08/26/2018  . Chronic diastolic CHF (congestive heart failure) ([place]) 12/10/2017  . Urinary incontinence, mixed 08/18/2017  . Hypercoagulable state due to atrial fibrillation ([place]) 08/06/2017  . Major depressive disorder with single  episode, in full remission ([place]) 08/06/2017  . Gastroesophageal reflux disease 04/29/2017  . Diarrhea of presumed infectious origin 04/29/2017  . Lumbar paraspinal muscle spasm 05/20/2016  . Pruritus 11/08/2013  . Hx  of peptic ulcer 06/13/2013  . Hyperglycemia 06/07/2013  . DJD (degenerative joint disease) 06/07/2013  . Hypokalemia 06/06/2013  . Abnormality of gait 06/02/2013  . Unintentional weight loss 03/29/2013  . Cough, persistent 03/29/2013  . Tremor 01/11/2013  . Nausea 01/11/2013  . Depression   . Alcohol dependence, continuous ([place]) 10/28/2012  . Insomnia 10/27/2012  . Paroxysmal atrial fibrillation (HCC)   . PVC (premature ventricular contraction)   . Essential hypertension 10/08/2012   PCP:  Gayland Curry, DO Pharmacy:   [facility] [place], [place] [address] AT [address] [place] [address] Phone: [phone] Fax: [phone]  [facility] - [place], [place] - [address] AT [place] [address] [place] [address] Phone: [phone] Fax: [phone]  [place], [place] - [address] [place] [address] Phone: [phone] Fax: [phone]     Social Determinants of Health (SDOH) Interventions    Readmission Risk Interventions No flowsheet data found.

## 2019-12-01 NOTE — NC FL2 (Signed)
[place] MEDICAID FL2 LEVEL OF CARE SCREENING TOOL     IDENTIFICATION  Patient Name: [name] Birthdate: May 16, 1933 Sex: female Admission Date (Current Location): 11/30/2019  [facility] and [place] Number:  Herbalist and Address:  [facility] [place] [address], [place], [place] [address]      Provider Number: [id number]  Attending Physician Name and Address:  Dessa Phi, DO  Relative Name and Phone Number:  Hilda Blades (daughter) [phone]    Current Level of Care: Hospital Recommended Level of Care: [place] Prior Approval Number:    Date Approved/Denied:   PASRR Number: [id number]  Discharge Plan: SNF    Current Diagnoses: Patient Active Problem List   Diagnosis Date Noted  . Symptomatic anemia 11/30/2019  . Pancytopenia ([place]) 11/30/2019  . Macrocytosis 11/30/2019  . Chronic constipation 11/30/2019  . Esophagitis 11/30/2019  . Neutropenia ([place])   . GI bleeding 10/03/2019  . Abnormal CT of the chest 04/19/2019  . Chronic rhinitis 11/30/2018  . Aortic atherosclerosis ([place]) 08/26/2018  . Thoracic aortic aneurysm without rupture ([place]) 08/26/2018  . Chronic diastolic CHF (congestive heart failure) ([place]) 12/10/2017  . Urinary incontinence, mixed 08/18/2017  . Hypercoagulable state due to atrial fibrillation ([place]) 08/06/2017  . Major depressive disorder with single episode, in full remission ([place]) 08/06/2017  . Gastroesophageal reflux disease 04/29/2017  . Diarrhea of presumed infectious origin 04/29/2017  . Lumbar paraspinal muscle spasm 05/20/2016  . Pruritus 11/08/2013  . Hx of peptic ulcer 06/13/2013  . Hyperglycemia 06/07/2013  . DJD (degenerative joint disease) 06/07/2013  . Hypokalemia 06/06/2013  . Abnormality of gait 06/02/2013  . Unintentional weight loss 03/29/2013  . Cough, persistent 03/29/2013  . Tremor 01/11/2013  . Nausea 01/11/2013  . Depression   . Alcohol dependence, continuous ([place])  10/28/2012  . Insomnia 10/27/2012  . Paroxysmal atrial fibrillation (HCC)   . PVC (premature ventricular contraction)   . Essential hypertension 10/08/2012    Orientation RESPIRATION BLADDER Height & Weight     Self, Time, Situation, Place  Normal Incontinent, External catheter Weight: 181 lb 8 oz (82.3 kg) Height:  5\' 8"  (172.7 cm)  BEHAVIORAL SYMPTOMS/MOOD NEUROLOGICAL BOWEL NUTRITION STATUS      Continent Diet(see discharge summary)  AMBULATORY STATUS COMMUNICATION OF NEEDS Skin   Extensive Assist Verbally Other (Comment)(ecchymosis right arm and left leg)                       Personal Care Assistance Level of Assistance  Bathing, Feeding, Dressing, Total care Bathing Assistance: Limited assistance Feeding assistance: Independent Dressing Assistance: Limited assistance Total Care Assistance: Maximum assistance   Functional Limitations Info  Sight, Hearing, Speech Sight Info: Adequate Hearing Info: Adequate Speech Info: Adequate    SPECIAL CARE FACTORS FREQUENCY  PT (By licensed PT), OT (By licensed OT)     PT Frequency: min 5x weekly OT Frequency: min 5x weekly            Contractures Contractures Info: Not present    Additional Factors Info  Code Status, Allergies Code Status Info: full Allergies Info: Nickel, acrylic nails and trace metals           Current Medications (12/01/2019):  This is the current hospital active medication list Current Facility-Administered Medications  Medication Dose Route Frequency Provider Last Rate Last Admin  . 0.9 %  sodium chloride infusion (Manually program via Guardrails IV Fluids)   Intravenous Once Dessa Phi, DO      .  0.9 %  sodium chloride infusion  10 mL/hr Intravenous Once Lajean Saver, MD      . acetaminophen (TYLENOL) tablet 650 mg  650 mg Oral Q6H PRN Pahwani, Rinka R, MD       Or  . acetaminophen (TYLENOL) suppository 650 mg  650 mg Rectal Q6H PRN Pahwani, Rinka R, MD      . bisacodyl (DULCOLAX)  suppository 10 mg  10 mg Rectal PRN Pahwani, Rinka R, MD      . feeding supplement (ENSURE ENLIVE) (ENSURE ENLIVE) liquid 237 mL  237 mL Oral BID BM Dessa Phi, DO      . flecainide (TAMBOCOR) tablet 50 mg  50 mg Oral Q12H Pahwani, Rinka R, MD   50 mg at 12/01/19 0928  . furosemide (LASIX) tablet 40 mg  40 mg Oral Daily Pahwani, Rinka R, MD   40 mg at 12/01/19 0929  . magic mouthwash w/lidocaine  5 mL Oral QID Mikey Bussing R, NP   5 mL at 12/01/19 1018  . Melatonin TABS 6 mg  6 mg Oral QHS Pahwani, Rinka R, MD   6 mg at 11/30/19 2234  . mirtazapine (REMERON) tablet 7.5 mg  7.5 mg Oral QHS Pahwani, Rinka R, MD   7.5 mg at 11/30/19 2230  . nebivolol (BYSTOLIC) tablet 10 mg  10 mg Oral Daily Pahwani, Rinka R, MD   10 mg at 12/01/19 0929  . ondansetron (ZOFRAN) tablet 4 mg  4 mg Oral Q6H PRN Pahwani, Rinka R, MD       Or  . ondansetron (ZOFRAN) injection 4 mg  4 mg Intravenous Q6H PRN Pahwani, Rinka R, MD      . oxyCODONE-acetaminophen (PERCOCET/ROXICET) 5-325 MG per tablet 1-2 tablet  1-2 tablet Oral Q6H PRN Pahwani, Michell Heinrich, MD   2 tablet at 12/01/19 0706  . pantoprazole (PROTONIX) EC tablet 40 mg  40 mg Oral BID Pahwani, Rinka R, MD   40 mg at 12/01/19 0929  . phenol (CHLORASEPTIC) mouth spray 1 spray  1 spray Mouth/Throat PRN Pahwani, Rinka R, MD   1 spray at 12/01/19 0127  . polyethylene glycol (MIRALAX / GLYCOLAX) packet 17 g  17 g Oral Daily Pahwani, Rinka R, MD   17 g at 12/01/19 1308  . predniSONE (DELTASONE) tablet 40 mg  40 mg Oral Q breakfast Pahwani, Rinka R, MD   40 mg at 12/01/19 0928   Followed by  . [START ON 12/03/2019] predniSONE (DELTASONE) tablet 30 mg  30 mg Oral Q breakfast Pahwani, Rinka R, MD       Followed by  . [START ON 12/06/2019] predniSONE (DELTASONE) tablet 20 mg  20 mg Oral Q breakfast Pahwani, Rinka R, MD       Followed by  . [START ON 12/08/2019] predniSONE (DELTASONE) tablet 10 mg  10 mg Oral Q breakfast Pahwani, Rinka R, MD      . senna-docusate (Senokot-S)  tablet 2 tablet  2 tablet Oral BID Pahwani, Michell Heinrich, MD   2 tablet at 12/01/19 0929  . triamcinolone (KENALOG) 0.1 % paste 1 application  1 application Mouth/Throat BID Pahwani, Rinka R, MD   1 application at [id number]  . zolpidem (AMBIEN) tablet 5 mg  5 mg Oral QHS Pahwani, Rinka R, MD   5 mg at 11/30/19 2121     Discharge Medications: Please see discharge summary for a list of discharge medications.  Relevant Imaging Results:  Relevant Lab Results:   Additional Information SSN: [id number]  Alberteen Sam, LCSW

## 2019-12-01 NOTE — Progress Notes (Addendum)
Paged Choi at [phone].  Pt. requesting Ensure and Soft diet instead of full liquid.   Two ulcers under right side of tongue, one ulcer under left side of tongue.   Spoke with Maylene Roes via phone at 1345 and informed patient prefers to disimpact self; RN will observe.  Paged Choi 1400. post-transfusion H&H? Transfusion complete at 1400.

## 2019-12-01 NOTE — Evaluation (Signed)
Physical Therapy Evaluation Patient Details Name: [name] MRN: [id number] DOB: 08/22/1933 Today's Date: 12/01/2019   History of Present Illness  Robyn D Furmanski is a 86 y.o.femalewith medical history significant ofA. fib with RVR on Eliquis, depression/anxiety, history of upper GI bleed, congestive heart failure, chronic constipation, hypertension, diverticulosis presents to ER due to generalized weakness, dizziness. Patient was on Eliquis due to A. fib which was held couple of days ago at SNF due to pancytopenia. She denies history of hematemesis, melena, hematuria, hemoptysis, epistaxis, abdominal pain, headache, chest pain, shortness of breath, palpitation. Reports nausea, chronic constipation and decreased appetite due to oral ulcers.   Clinical Impression  Pt admitted with above diagnosis. Pt was able to pivot to 3N1 and back with min guard assist to supervision. Pt was at SNF getting therapy and this PT feels that she needs to return to gain strength and endurance prior to d/c back to I living.  Pt currently with functional limitations due to the deficits listed below (see PT Problem List). Pt will benefit from skilled PT to increase their independence and safety with mobility to allow discharge to the venue listed below.      Follow Up Recommendations SNF;Supervision/Assistance - 24 hour    Equipment Recommendations  None recommended by PT    Recommendations for Other Services       Precautions / Restrictions Precautions Precautions: Fall Restrictions Weight Bearing Restrictions: No      Mobility  Bed Mobility Overal bed mobility: Independent                Transfers Overall transfer level: Needs assistance Equipment used: None Transfers: Sit to/from Stand;Stand Pivot Transfers Sit to Stand: Supervision Stand pivot transfers: Supervision       General transfer comment: Pt didnt need assist to stand or to pivot to 3N1 as pt asked to use bathroom. Pt able  to clean self.  Pt then transferred back to bed as she states she is too tired to ambulate today.   Ambulation/Gait             General Gait Details: NT today  Stairs            Wheelchair Mobility    Modified Rankin (Stroke Patients Only)       Balance Overall balance assessment: Needs assistance Sitting-balance support: No upper extremity supported;Feet supported Sitting balance-Leahy Scale: Fair     Standing balance support: No upper extremity supported;During functional activity Standing balance-Leahy Scale: Fair Standing balance comment: Pt can stand without UE support and without physical assist                             Pertinent Vitals/Pain Pain Assessment: No/denies pain    Home Living Family/patient expects to be discharged to:: Skilled nursing facility Living Arrangements: Alone Available Help at Discharge: Available PRN/intermittently(had sitter 10 hours day M-Sun ) Type of Home: Independent living facility Home Access: Level entry     Home Layout: One level Home Equipment: Wheelchair - manual;Bedside commode;Walker - 4 wheels Additional Comments: pt lives at [facility] in independent living, has aide for 5 hrs/day that provide assist for meals, laundry, supervision for bathing.    Prior Function Level of Independence: Independent with assistive device(s);Needs assistance   Gait / Transfers Assistance Needed: pt is modI with RW in the home, utilizes wheelchair which someone pushes her in for community mobility  ADL's / Homemaking Assistance Needed: Supervision for  bathing  Comments: meals delivered, does not drive     Hand Dominance   Dominant Hand: Right    Extremity/Trunk Assessment   Upper Extremity Assessment Upper Extremity Assessment: Defer to OT evaluation    Lower Extremity Assessment Lower Extremity Assessment: Generalized weakness    Cervical / Trunk Assessment Cervical / Trunk Assessment: Normal   Communication   Communication: No difficulties  Cognition Arousal/Alertness: Awake/alert Behavior During Therapy: WFL for tasks assessed/performed Overall Cognitive Status: Within Functional Limits for tasks assessed                                        General Comments      Exercises     Assessment/Plan    PT Assessment    PT Problem List         PT Treatment Interventions      PT Goals (Current goals can be found in the Care Plan section)       Frequency Min 2X/week   Barriers to discharge        Co-evaluation               AM-PAC PT "6 Clicks" Mobility  Outcome Measure Help needed turning from your back to your side while in a flat bed without using bedrails?: None Help needed moving from lying on your back to sitting on the side of a flat bed without using bedrails?: None Help needed moving to and from a bed to a chair (including a wheelchair)?: A Little Help needed standing up from a chair using your arms (e.g., wheelchair or bedside chair)?: A Little Help needed to walk in hospital room?: A Little Help needed climbing 3-5 steps with a railing? : A Lot 6 Click Score: 19    End of Session Equipment Utilized During Treatment: Gait belt(3LO2 ) Activity Tolerance: Patient limited by fatigue Patient left: in bed;with call bell/phone within reach;with nursing/sitter in room(nurse giving pt her meds) Nurse Communication: Mobility status PT Visit Diagnosis: Unsteadiness on feet (R26.81);Muscle weakness (generalized) (M62.81)    Time: [id number] PT Time Calculation (min) (ACUTE ONLY): 17 min   Charges:   PT Evaluation $PT Eval Moderate Complexity: 1 Mod          Cortnie W,PT Acute Rehabilitation Services Pager:  [phone]  Office:  [place] 12/01/2019, 1:25 PM

## 2019-12-01 NOTE — Progress Notes (Signed)
Patient declined IV reinsertion since she plans to be discharged tomorrow. Night RN notified.

## 2019-12-01 NOTE — Progress Notes (Signed)
   12/01/19 0908  Restrictions  Weight Bearing Restrictions No  PT Recommendation  Follow Up Recommendations SNF;Supervision/Assistance - 24 hour  PT equipment None recommended by PT  Full note to follow. Pt needs to continue SNF on d/c with therapy to eventually return back to I living. Thanks.  Farrah W,PT Acute Rehabilitation Services Pager:  [phone]  Office:  [phone]

## 2019-12-01 NOTE — Progress Notes (Signed)
PROGRESS NOTE    [name]  [id number] DOB: 16-May-1933 DOA: 11/30/2019 PCP: Gayland Curry, DO     Brief Narrative:  [name] is a 86 y.o. female with medical history significant of A. fib with RVR on Eliquis, depression/anxiety, history of upper GI bleed, congestive heart failure, chronic constipation, hypertension, diverticulosis presents to ER due to generalized weakness, dizziness. Patient was on Eliquis due to A. fib which was held couple of days ago at SNF due to pancytopenia.  She denies history of hematemesis, melena, hematuria, hemoptysis, epistaxis, abdominal pain, headache, chest pain, shortness of breath, palpitation.  Reports nausea, chronic constipation and decreased appetite due to oral ulcers.    She had upper GI endoscopy on 10/05/2019 which showed esophagitis. She had oral lesion biopsy which showed an inflammatory process.  ESR elevated at 107, CRP: >5.31.  Patient started on tapering dose of prednisone at SNF and Norco was switched to Percocet for oral pain.    Oncology/hematology was consulted due to patient's pancytopenia.  She was recommended to transfuse to keep her hemoglobin >8.  She was recommended to undergo bone marrow biopsy which patient declined.  She states that even if the results came back positive for disease, she would not want to pursue any treatments.  New events last 24 hours / Subjective: Wants to try a liquid/soft diet.  States that Magic mouthwash is helping with the oral ulcer pain.  Assessment & Plan:   Principal Problem:   Symptomatic anemia Active Problems:   Essential hypertension   Paroxysmal atrial fibrillation (HCC)   Depression   DJD (degenerative joint disease)   Chronic diastolic CHF (congestive heart failure) (HCC)   Pancytopenia (HCC)   Macrocytosis   Chronic constipation   Esophagitis    Symptomatic anemia with pancytopenia -Unknown etiology.  FOBT negative.  Patient has declined further evaluation with bone  marrow biopsy at this time.  Transfused 2 unit packed red blood cell 3/9 -Transfuse to maintain hemoglobin >8.  Transfuse 1 additional unit today is 3/10  Oral ulcers -Continue prednisone taper, chloraseptic spray, Magic mouthwash   Paroxysmal A. Fib -Hold Eliquis for now.  Continue flecainide and Bystolic  Grade 2 diastolic congestive heart failure -Echo from 09/22/2019.  Preserved ejection fraction of 60 to 65% -Continue Lasix  Hypertension -Continue Bystolic, Lasix  History of upper GI bleed -Upper GI endoscopy from 10/05/2019 which showed esophagitis.  Continue PPI twice daily.  Avoid NSAIDs  Depression/anxiety -Continue Remeron and Ambien   DVT prophylaxis: Eliquis on hold Code Status: Full code Family Communication: None at baseline Disposition Plan:  . Patient is from SNF prior to admission. . Currently in-hospital treatment needed due to blood transfusion. . Suspect patient will discharge back to SNF 3/11 as long as CBC remains stable   Consultants:   Hematology/oncology  Procedures:   None  Antimicrobials:  Anti-infectives (From admission, onward)   None        Objective: Vitals:   12/01/19 0816 12/01/19 1045 12/01/19 1054 12/01/19 1110  BP: (!) 128/55 (!) 116/43 (!) 116/43 (!) 117/50  Pulse: 66 65 65 61  Resp: _0 Temp: 97.8 F (36.6 C)  (!) 97.5 F (36.4 C) 97.7 F (36.5 C)  TempSrc: Oral  Oral Oral  SpO2: 92% 96% 94% 94%  Weight:      Height:        Intake/Output Summary (Last 24 hours) at 12/01/2019 1146 Last data filed at 12/01/2019 0516 Gross per 24  hour  Intake 2005 ml  Output 300 ml  Net 1705 ml   Filed Weights   11/30/19 1211 11/30/19 2335 12/01/19 0516  Weight: 81.2 kg 83.2 kg 82.3 kg    Examination:  General exam: Appears calm and comfortable  Mouth: Oral ulcers on right side of tongue  Respiratory system: Clear to auscultation. Respiratory effort normal. No respiratory distress. No conversational dyspnea.    Cardiovascular system: S1 & S2 heard, RRR. No murmurs. No pedal edema. Gastrointestinal system: Abdomen is nondistended, soft and nontender. Normal bowel sounds heard. Central nervous system: Alert and oriented. No focal neurological deficits. Speech clear.  Extremities: Symmetric in appearance  Skin: No rashes, lesions or ulcers on exposed skin  Psychiatry: Judgement and insight appear normal. Mood & affect appropriate.   Data Reviewed: I have personally reviewed following labs and imaging studies  CBC: Recent Labs  Lab 11/27/19 0000 11/30/19 1237 12/01/19 0435  WBC 1.5 1.3* 1.4*  NEUTROABS 0  --   --   HGB 6.1* 5.7* 7.3*  HCT 18* 17.6* 21.7*  MCV  --  107.3* 98.2  PLT 109* 130* 546*   Basic Metabolic Panel: Recent Labs  Lab 11/27/19 0000 11/30/19 1237 11/30/19 1639 12/01/19 0435  NA 136* 136  --  137  K 4.0 4.0  --  3.8  CL 94* 96*  --  99  CO2 27* 27  --  29  GLUCOSE  --  194*  --  117*  BUN 33* 25*  --  27*  CREATININE 1.0 0.97  --  1.00  CALCIUM  --  9.4  --  9.1  MG  --   --  1.8  --    GFR: Estimated Creatinine Clearance: 47.1 mL/min (by C-G formula based on SCr of 1 mg/dL). Liver Function Tests: Recent Labs  Lab 11/30/19 1237 12/01/19 0435  AST 18 17  ALT 13 11  ALKPHOS 71 60  BILITOT 0.8 1.0  PROT 6.3* 5.5*  ALBUMIN 3.6 3.1*   No results for input(s): LIPASE, AMYLASE in the last 168 hours. No results for input(s): AMMONIA in the last 168 hours. Coagulation Profile: Recent Labs  Lab 11/30/19 1237 11/30/19 1639  INR 1.1 1.1   Cardiac Enzymes: No results for input(s): CKTOTAL, CKMB, CKMBINDEX, TROPONINI in the last 168 hours. BNP (last 3 results) No results for input(s): PROBNP in the last 8760 hours. HbA1C: No results for input(s): HGBA1C in the last 72 hours. CBG: No results for input(s): GLUCAP in the last 168 hours. Lipid Profile: No results for input(s): CHOL, HDL, LDLCALC, TRIG, CHOLHDL, LDLDIRECT in the last 72 hours. Thyroid  Function Tests: Recent Labs    11/30/19 1639  TSH 1.348   Anemia Panel: Recent Labs    11/30/19 1639  VITAMINB12 1,454*  FERRITIN 158  TIBC 333  IRON 178*   Sepsis Labs: No results for input(s): PROCALCITON, LATICACIDVEN in the last 168 hours.  Recent Results (from the past 240 hour(s))  SARS CORONAVIRUS 2 (TAT 6-24 HRS) Nasopharyngeal Nasopharyngeal Swab     Status: None   Collection Time: 11/30/19  4:39 PM   Specimen: Nasopharyngeal Swab  Result Value Ref Range Status   SARS Coronavirus 2 NEGATIVE NEGATIVE Final    Comment: (NOTE) SARS-CoV-2 target nucleic acids are NOT DETECTED. The SARS-CoV-2 RNA is generally detectable in upper and lower respiratory specimens during the acute phase of infection. Negative results do not preclude SARS-CoV-2 infection, do not rule out co-infections with other pathogens, and should  not be used as the sole basis for treatment or other patient management decisions. Negative results must be combined with clinical observations, patient history, and epidemiological information. The expected result is Negative. Fact Sheet for Patients: https://www.fda.gov/media/138098/download Fact Sheet for Healthcare Providers: https://www.fda.gov/media/138095/download This test is not yet approved or cleared by the [place] FDA and  has been authorized for detection and/or diagnosis of SARS-CoV-2 by FDA under an Emergency Use Authorization (EUA). This EUA will remain  in effect (meaning this test can be used) for the duration of the COVID-19 declaration under Section 56 4(b)(1) of the Act, 21 U.S.C. section 360bbb-3(b)(1), unless the authorization is terminated or revoked sooner. Performed at [facility], [address] [place], [place] [address]   MRSA PCR Screening     Status: None   Collection Time: 12/01/19  4:47 AM   Specimen: Nasal Mucosa; Nasopharyngeal  Result Value Ref Range Status   MRSA by PCR NEGATIVE NEGATIVE Final     Comment:        The GeneXpert MRSA Assay (FDA approved for NASAL specimens only), is one component of a comprehensive MRSA colonization surveillance program. It is not intended to diagnose MRSA infection nor to guide or monitor treatment for MRSA infections. Performed at  [facility], [address] [place], [place] [address]       Radiology Studies: No results found.    Scheduled Meds: . sodium chloride   Intravenous Once  . feeding supplement (ENSURE ENLIVE)  237 mL Oral BID BM  . flecainide  50 mg Oral Q12H  . furosemide  40 mg Oral Daily  . magic mouthwash w/lidocaine  5 mL Oral QID  . Melatonin  6 mg Oral QHS  . mirtazapine  7.5 mg Oral QHS  . nebivolol  10 mg Oral Daily  . pantoprazole  40 mg Oral BID  . polyethylene glycol  17 g Oral Daily  . predniSONE  40 mg Oral Q breakfast   Followed by  . [START ON 12/03/2019] predniSONE  30 mg Oral Q breakfast   Followed by  . [START ON 12/06/2019] predniSONE  20 mg Oral Q breakfast   Followed by  . [START ON 12/08/2019] predniSONE  10 mg Oral Q breakfast  . senna-docusate  2 tablet Oral BID  . triamcinolone  1 application Mouth/Throat BID  . zolpidem  5 mg Oral QHS   Continuous Infusions: . sodium chloride       LOS: 1 day      Time spent: 35 minutes    , DO [facility] 12/01/2019, 11:46 AM   Available via Epic secure chat 7am-7pm After these hours, please refer to coverage provider listed on [email or web address]  

## 2019-12-01 NOTE — Plan of Care (Signed)
  Problem: Activity: Goal: Capacity to carry out activities will improve Outcome: Progressing   Problem: Activity: Goal: Risk for activity intolerance will decrease Outcome: Progressing   Problem: Safety: Goal: Ability to remain free from injury will improve Outcome: Progressing   

## 2019-12-02 LAB — TYPE AND SCREEN
ABO/RH(D): O NEG
Antibody Screen: NEGATIVE
Unit division: 0
Unit division: 0
Unit division: 0

## 2019-12-02 LAB — CBC
HCT: 25.9 % — ABNORMAL LOW (ref 36.0–46.0)
Hemoglobin: 8.7 g/dL — ABNORMAL LOW (ref 12.0–15.0)
MCH: 32.3 pg (ref 26.0–34.0)
MCHC: 33.6 g/dL (ref 30.0–36.0)
MCV: 96.3 fL (ref 80.0–100.0)
Platelets: 92 10*3/uL — ABNORMAL LOW (ref 150–400)
RBC: 2.69 MIL/uL — ABNORMAL LOW (ref 3.87–5.11)
RDW: 21.4 % — ABNORMAL HIGH (ref 11.5–15.5)
WBC: 1.7 10*3/uL — ABNORMAL LOW (ref 4.0–10.5)
nRBC: 4.2 % — ABNORMAL HIGH (ref 0.0–0.2)

## 2019-12-02 LAB — BPAM RBC
Blood Product Expiration Date: 202103222359
Blood Product Expiration Date: 202104152359
Blood Product Expiration Date: 202104152359
ISSUE DATE / TIME: 202103091448
ISSUE DATE / TIME: 202103091642
ISSUE DATE / TIME: 202103101047
Unit Type and Rh: 9500
Unit Type and Rh: 9500
Unit Type and Rh: 9500

## 2019-12-02 MED ORDER — MAGIC MOUTHWASH W/LIDOCAINE: 5.0000 mL | Freq: Four times a day (QID) | ORAL | 0 refills | Status: DC

## 2019-12-02 MED ORDER — OXYCODONE-ACETAMINOPHEN 5-325 MG PO TABS: 1.0000 | ORAL_TABLET | Freq: Four times a day (QID) | ORAL | 0 refills | Status: DC | PRN

## 2019-12-02 MED ORDER — ZOLPIDEM TARTRATE 5 MG PO TABS: 5.0000 mg | ORAL_TABLET | Freq: Every day | ORAL | 0 refills | Status: DC

## 2019-12-02 NOTE — TOC Transition Note (Signed)
Transition of Care [facility]) - CM/SW Discharge Note   Patient Details  Name: [name] MRN: [id number] Date of Birth: Aug 27, 1933  [facility] of [facility] CM/SW Contact:  Alberteen Sam, LCSW Phone Number: 12/02/2019, 9:40 AM   Clinical Narrative:     Patient will DC to: [facility] Anticipated DC date: 12/02/19 Family notified: patient will inform Transport [id number]  Per MD patient ready for DC to [facility] . RN, patient, patient's family, and facility notified of DC. Discharge Summary sent to facility. RN given number for report  [phone] [facility] Room 128. DC packet on chart. Ambulance transport requested for patient.  CSW signing off.  [place], [place]   Final next level of care: Skilled Nursing Facility Barriers to Discharge: No Barriers Identified   Patient Goals and CMS Choice Patient states their goals for this hospitalization and ongoing recovery are:: return to [facility].gov Compare Post Acute Care list provided to:: Patient Choice offered to / list presented to : Patient  Discharge Placement PASRR number recieved: 12/01/19            Patient chooses bed at: [facility] Patient to be transferred to facility by: [facility] Name of family member notified: patient will inform Patient and family notified of of transfer: 12/02/19  Discharge Plan and Services     Post Acute Care Choice: [facility]                               Social Determinants of Health (SDOH) Interventions     Readmission Risk Interventions No flowsheet data found.

## 2019-12-02 NOTE — Progress Notes (Addendum)
Discharged to [facility] SNF.Report given to [facility]. Awaiting transport.

## 2019-12-02 NOTE — Discharge Summary (Signed)
Physician Discharge Summary  [name] WGN:[id number] DOB: 11-07-1932 DOA: 11/30/2019  PCP: Gayland Curry, DO  Admit date: 11/30/2019 Discharge date: 12/02/2019  [facility] From[facility]  Disposition:  SNF   Recommendations for Outpatient Follow-up:  1. Follow up with PCP in 1 week 2. Follow up with Dr. Lindi Adie as outpatient  3. Would recommend outpatient palliative care to follow up with patient. She is not interested in any further work up for anemia, nor returning back to hospital for treatment.   Discharge Condition: Stable CODE STATUS: Full  Diet recommendation: Regular   Brief/Interim Summary: [name] is a 86 y.o.femalewith medical history significant ofA. fib with RVR on Eliquis, depression/anxiety, history of upper GI bleed, congestive heart failure, chronic constipation, hypertension, diverticulosis presents to ER due to generalized weakness, dizziness. Patient was on Eliquis due to A. fib which was held couple of days ago at SNF due to pancytopenia. She denies history of hematemesis, melena, hematuria, hemoptysis, epistaxis, abdominal pain, headache, chest pain, shortness of breath, palpitation. Reports nausea, chronic constipation and decreased appetite due to oral ulcers.   She had upper GI endoscopy on 10/05/2019 which showed esophagitis. She had oral lesion biopsy which showed an inflammatory process. ESR elevated at 107, CRP: >5.31.Patient started on tapering dose of prednisone at SNF and Norco was switched to Percocet for oral pain.   Oncology/hematology was consulted due to patient's pancytopenia.  She was recommended to transfuse to keep her hemoglobin >8.  She was recommended to undergo bone marrow biopsy which patient declined.  She states that even if the results came back positive for disease, she would not want to pursue any treatments.  Discharge Diagnoses:  Principal Problem:   Symptomatic anemia Active Problems:   Essential hypertension   Paroxysmal atrial fibrillation (HCC)   Depression   DJD (degenerative joint disease)   Chronic diastolic CHF (congestive heart failure) (HCC)   Pancytopenia (HCC)   Macrocytosis   Chronic constipation   Esophagitis   Symptomatic anemia with pancytopenia -Unknown etiology.  FOBT negative.  Patient has declined further evaluation with bone marrow biopsy at this time.  Transfused 2 unit packed red blood cell 3/9, 1 unit packed red blood cell 3/10 -Transfuse to maintain hemoglobin >8 -Supportive care, follow up with Hematology as outpatient   Oral ulcers -Continue prednisone taper, chloraseptic spray, Magic mouthwash   Paroxysmal A. Fib -Resume Eliquis. Continue flecainide and Bystolic  Grade 2 diastolic congestive heart failure -Echo from 09/22/2019. Preserved ejection fraction of 60 to 65% -Continue Lasix  Hypertension -Continue Bystolic, Lasix  History of upper GI bleed -Upper GI endoscopy from 10/05/2019 which showed esophagitis. Continue PPI twice daily. Avoid NSAIDs  Depression/anxiety -Continue Remeron and Ambien   Discharge Instructions  Discharge Instructions    Increase activity slowly   Complete by: As directed      Allergies as of 12/02/2019      Reactions   Nickel Other (See Comments)   inflammation   Other Itching   Acrylic nails, and trace metals      Medication List    STOP taking these medications   HYDROcodone-acetaminophen 5-325 MG tablet Commonly known as: NORCO/VICODIN     TAKE these medications   acetaminophen 500 MG tablet Commonly known as: TYLENOL Take 750-1,000 mg by mouth See admin instructions. Take two tablets by mouth every morning then take 1 1/2 tablet at night   allopurinol 100 MG tablet Commonly known as: ZYLOPRIM Take 100 mg by mouth daily.  bisacodyl 10 MG suppository Commonly known as: DULCOLAX Place 10 mg rectally as needed for moderate constipation.   Chloraseptic 1.4 % Liqd Generic drug: phenol Use as  directed 1 spray in the mouth or throat as needed for throat irritation / pain.   flecainide 50 MG tablet Commonly known as: TAMBOCOR TAKE 1 TABLET BY MOUTH TWICE DAILY FOR ATRIAL FIBRILLATION   furosemide 40 MG tablet Commonly known as: LASIX Take 1 tablet (40 mg total) by mouth daily.   lidocaine 2 % solution Commonly known as: XYLOCAINE Use as directed 15 mLs in the mouth or throat every 4 (four) hours as needed for mouth pain.   magic mouthwash w/lidocaine Soln Take 5 mLs by mouth 4 (four) times daily.   Melatonin 5 MG Tabs Take 5 mg by mouth at bedtime.   mirtazapine 7.5 MG tablet Commonly known as: REMERON Take 1 tablet (7.5 mg total) by mouth at bedtime.   nebivolol 10 MG tablet Commonly known as: BYSTOLIC Take 10 mg by mouth daily.   ondansetron 4 MG tablet Commonly known as: Zofran Take 1 tablet (4 mg total) by mouth every 8 (eight) hours as needed for nausea or vomiting.   oxyCODONE-acetaminophen 5-325 MG tablet Commonly known as: PERCOCET/ROXICET Take 1-2 tablets by mouth every 6 (six) hours as needed for severe pain. 1 tab for moderate pain and 2 tabs for severe pain   pantoprazole 40 MG tablet Commonly known as: Protonix Take 1 tablet (40 mg total) by mouth 2 (two) times daily.   polyethylene glycol 17 g packet Commonly known as: MIRALAX / GLYCOLAX Take 17 g by mouth daily.   predniSONE 20 MG tablet Commonly known as: DELTASONE Take 20 mg by mouth See admin instructions. Give '40mg'$  everyday X4 days, then '30mg'$  qday X3days , then '20mg'$  qd X2days then '10mg'$  qd X1 then d/c (Take w/ food)   senna-docusate 8.6-50 MG tablet Commonly known as: Senokot-S Take 2 tablets by mouth 2 (two) times daily.   sodium chloride 0.65 % Soln nasal spray Commonly known as: OCEAN Place 1 spray into both nostrils 3 (three) times daily as needed for congestion.   triamcinolone 0.1 % paste Commonly known as: KENALOG Use as directed 1 application in the mouth or throat 2 (two)  times daily.   zolpidem 5 MG tablet Commonly known as: AMBIEN Take 1 tablet (5 mg total) by mouth at bedtime.      Follow-up Information    Reed, Tiffany L, DO. Schedule an appointment as soon as possible for a visit in 1 week(s).   Specialty: Geriatric Medicine Contact information: [address]. [place] [address] [phone]        Nicholas Lose, MD. Schedule an appointment as soon as possible for a visit in 1 week(s).   Specialty: Hematology and Oncology Contact information: [place] [address] [phone]          Allergies  Allergen Reactions  . Nickel Other (See Comments)    inflammation  . Other Itching    Acrylic nails, and trace metals    Consultations:  Heme/Oncology   Procedures/Studies: No results found.     Discharge Exam: Vitals:   12/02/19 0500 12/02/19 0826  BP: (!) 140/58 (!) 138/46  Pulse: 68 67  Resp: 20   Temp: 98 F (36.7 C)   SpO2:      General: Pt is alert, awake, not in acute distress Mouth: +oral ulcers present  Cardiovascular: RRR, S1/S2 +, no  edema Respiratory: CTA bilaterally, no wheezing, no rhonchi, no respiratory distress, no conversational dyspnea  Abdominal: Soft, NT, ND, bowel sounds + Extremities: no edema, no cyanosis Psych: Normal mood and affect, stable judgement and insight     The results of significant diagnostics from this hospitalization (including imaging, microbiology, ancillary and laboratory) are listed below for reference.     Microbiology: Recent Results (from the past 240 hour(s))  SARS CORONAVIRUS 2 (TAT 6-24 HRS) Nasopharyngeal Nasopharyngeal Swab     Status: None   Collection Time: 11/30/19  4:39 PM   Specimen: Nasopharyngeal Swab  Result Value Ref Range Status   SARS Coronavirus 2 NEGATIVE NEGATIVE Final    Comment: (NOTE) SARS-CoV-2 target nucleic acids are NOT DETECTED. The SARS-CoV-2 RNA is generally detectable in upper and lower respiratory  specimens during the acute phase of infection. Negative results do not preclude SARS-CoV-2 infection, do not rule out co-infections with other pathogens, and should not be used as the sole basis for treatment or other patient management decisions. Negative results must be combined with clinical observations, patient history, and epidemiological information. The expected result is Negative. Fact Sheet for Patients: SugarRoll.be Fact Sheet for Healthcare Providers: https://www.woods-mathews.com/ This test is not yet approved or cleared by the [place] FDA and  has been authorized for detection and/or diagnosis of SARS-CoV-2 by FDA under an Emergency Use Authorization (EUA). This EUA will remain  in effect (meaning this test can be used) for the duration of the COVID-19 declaration under Section 56 4(b)(1) of the Act, 21 U.S.C. section 360bbb-3(b)(1), unless the authorization is terminated or revoked sooner. Performed at [facility], [place] [address] [place], [place] [address]   MRSA PCR Screening     Status: None   Collection Time: 12/01/19  4:47 AM   Specimen: Nasal Mucosa; Nasopharyngeal  Result Value Ref Range Status   MRSA by PCR NEGATIVE NEGATIVE Final    Comment:        The GeneXpert MRSA Assay (FDA approved for NASAL specimens only), is one component of a comprehensive MRSA colonization surveillance program. It is not intended to diagnose MRSA infection nor to guide or monitor treatment for MRSA infections. Performed at [facility], [place] [address] [place], [place] [address]      Labs: BNP (last 3 results) Recent Labs    09/22/19 1436  BNP 440.1*   Basic Metabolic Panel: Recent Labs  Lab 11/27/19 0000 11/30/19 1237 11/30/19 1639 12/01/19 0435  NA 136* 136  --  137  K 4.0 4.0  --  3.8  CL 94* 96*  --  99  CO2 27* 27  --  29  GLUCOSE  --  194*  --  117*  BUN 33* 25*  --  27*  CREATININE 1.0 0.97   --  1.00  CALCIUM  --  9.4  --  9.1  MG  --   --  1.8  --    Liver Function Tests: Recent Labs  Lab 11/30/19 1237 12/01/19 0435  AST 18 17  ALT 13 11  ALKPHOS 71 60  BILITOT 0.8 1.0  PROT 6.3* 5.5*  ALBUMIN 3.6 3.1*   No results for input(s): LIPASE, AMYLASE in the last 168 hours. No results for input(s): AMMONIA in the last 168 hours. CBC: Recent Labs  Lab 11/27/19 0000 11/27/19 0000 11/30/19 1237 11/30/19 1639 12/01/19 0435 12/01/19 1539 12/02/19 0523  WBC 1.5  --  1.3*  --  1.4*  --  1.7*  NEUTROABS 0  --   --   --   --   --   --   HGB 6.1*  --  5.7*  --  7.3* 9.6* 8.7*  HCT 18*   < > 17.6* 19.7* 21.7* 28.5* 25.9*  MCV  --   --  107.3*  --  98.2  --  96.3  PLT 109*  --  130*  --  104*  --  92*   < > = values in this interval not displayed.   Cardiac Enzymes: No results for input(s): CKTOTAL, CKMB, CKMBINDEX, TROPONINI in the last 168 hours. BNP: Invalid input(s): POCBNP CBG: No results for input(s): GLUCAP in the last 168 hours. D-Dimer No results for input(s): DDIMER in the last 72 hours. Hgb A1c No results for input(s): HGBA1C in the last 72 hours. Lipid Profile No results for input(s): CHOL, HDL, LDLCALC, TRIG, CHOLHDL, LDLDIRECT in the last 72 hours. Thyroid function studies Recent Labs    11/30/19 1639  TSH 1.348   Anemia work up Recent Labs    11/30/19 1639  VITAMINB12 1,454*  FERRITIN 158  TIBC 333  IRON 178*   Urinalysis    Component Value Date/Time   COLORURINE YELLOW 11/30/2019 1700   APPEARANCEUR CLEAR 11/30/2019 1700   LABSPEC 1.008 11/30/2019 1700   PHURINE 7.0 11/30/2019 1700   GLUCOSEU NEGATIVE 11/30/2019 1700   HGBUR NEGATIVE 11/30/2019 1700   BILIRUBINUR NEGATIVE 11/30/2019 1700   BILIRUBINUR Neg 09/08/2017 1557   KETONESUR NEGATIVE 11/30/2019 1700   PROTEINUR NEGATIVE 11/30/2019 1700   UROBILINOGEN 0.2 09/08/2017 1557   UROBILINOGEN 0.2 12/02/2014 1242   NITRITE NEGATIVE 11/30/2019 1700   LEUKOCYTESUR TRACE (A)  11/30/2019 1700   Sepsis Labs Invalid input(s): PROCALCITONIN,  WBC,  LACTICIDVEN Microbiology Recent Results (from the past 240 hour(s))  SARS CORONAVIRUS 2 (TAT 6-24 HRS) Nasopharyngeal Nasopharyngeal Swab     Status: None   Collection Time: 11/30/19  4:39 PM   Specimen: Nasopharyngeal Swab  Result Value Ref Range Status   SARS Coronavirus 2 NEGATIVE NEGATIVE Final    Comment: (NOTE) SARS-CoV-2 target nucleic acids are NOT DETECTED. The SARS-CoV-2 RNA is generally detectable in upper and lower respiratory specimens during the acute phase of infection. Negative results do not preclude SARS-CoV-2 infection, do not rule out co-infections with other pathogens, and should not be used as the sole basis for treatment or other patient management decisions. Negative results must be combined with clinical observations, patient history, and epidemiological information. The expected result is Negative. Fact Sheet for Patients: SugarRoll.be Fact Sheet for Healthcare Providers: https://www.woods-mathews.com/ This test is not yet approved or cleared by the [place] FDA and  has been authorized for detection and/or diagnosis of SARS-CoV-2 by FDA under an Emergency Use Authorization (EUA). This EUA will remain  in effect (meaning this test can be used) for the duration of the COVID-19 declaration under Section 56 4(b)(1) of the Act, 21 U.S.C. section 360bbb-3(b)(1), unless the authorization is terminated or revoked sooner. Performed at [facility], [place] [address] [place], [place] [address]   MRSA PCR Screening     Status: None   Collection Time: 12/01/19  4:47 AM   Specimen: Nasal Mucosa; Nasopharyngeal  Result Value Ref Range Status   MRSA by PCR NEGATIVE NEGATIVE Final    Comment:        The GeneXpert MRSA Assay (FDA approved for NASAL specimens only), is one component of a comprehensive MRSA colonization surveillance program.  It is  not intended to diagnose MRSA infection nor to guide or monitor treatment for MRSA infections. Performed at [facility], [place] [address] [place], [place] [address]      Patient was seen and examined on the day of discharge and was found to be in stable condition. Time coordinating discharge: 35 minutes including assessment and coordination of care, as well as examination of the patient.   SIGNED:  Dessa Phi, DO [facility] 12/02/2019, 9:05 AM

## 2019-12-03 ENCOUNTER — Other Ambulatory Visit: Payer: Self-pay | Admitting: *Deleted

## 2019-12-03 DIAGNOSIS — D649 Anemia, unspecified: Secondary | ICD-10-CM

## 2019-12-06 ENCOUNTER — Non-Acute Institutional Stay (SKILLED_NURSING_FACILITY): Payer: Medicare Other | Admitting: Adult Health

## 2019-12-06 ENCOUNTER — Other Ambulatory Visit: Payer: Self-pay | Admitting: Adult Health

## 2019-12-06 ENCOUNTER — Encounter: Payer: Self-pay | Admitting: Adult Health

## 2019-12-06 DIAGNOSIS — R638 Other symptoms and signs concerning food and fluid intake: Secondary | ICD-10-CM | POA: Diagnosis not present

## 2019-12-06 DIAGNOSIS — K209 Esophagitis, unspecified without bleeding: Secondary | ICD-10-CM

## 2019-12-06 DIAGNOSIS — K5909 Other constipation: Secondary | ICD-10-CM

## 2019-12-06 DIAGNOSIS — Z66 Do not resuscitate: Secondary | ICD-10-CM

## 2019-12-06 DIAGNOSIS — M352 Behcet's disease: Secondary | ICD-10-CM

## 2019-12-06 DIAGNOSIS — R531 Weakness: Secondary | ICD-10-CM

## 2019-12-06 DIAGNOSIS — F321 Major depressive disorder, single episode, moderate: Secondary | ICD-10-CM | POA: Diagnosis not present

## 2019-12-06 DIAGNOSIS — I480 Paroxysmal atrial fibrillation: Secondary | ICD-10-CM | POA: Diagnosis not present

## 2019-12-06 DIAGNOSIS — D61818 Other pancytopenia: Secondary | ICD-10-CM | POA: Diagnosis not present

## 2019-12-06 DIAGNOSIS — I5032 Chronic diastolic (congestive) heart failure: Secondary | ICD-10-CM

## 2019-12-06 MED ORDER — MORPHINE SULFATE (CONCENTRATE) 20 MG/ML PO SOLN: 5.0000 mg | ORAL | 0 refills | Status: DC | PRN

## 2019-12-06 MED ORDER — MORPHINE SULFATE (CONCENTRATE) 20 MG/ML PO SOLN: 5.0000 mg | ORAL | 0 refills | Status: AC | PRN

## 2019-12-06 NOTE — Progress Notes (Signed)
Nurse Tammy reports that 5 mg of Roxanol was not enough to control [name]'s pain. Roxanol was increased to 5 mg for moderate pain and 10 mg for severe pain q 4 hrs prn due to oral and genital pain.

## 2019-12-06 NOTE — ACP (Advance Care Planning) (Signed)
[name] is experiencing severe pain and reduced quality of life due to oral and genital lesions, along with a pancytopenia. She does not want treatment for the pancytopenia which may likely be bone marrow disease. She would like treatment for the oral and genital lesions which could be due to Behcets disease. Her goals of care are comfort based. She is a DNR and wishes never to go back to the hospital again. She is rapidly declining and may be appropriate for hospice in the near future.

## 2019-12-06 NOTE — Progress Notes (Signed)
Location:  [facility]up[facility] psychologist of Service:  SNF (31) Provider:   Cindi Carbon, ANP [place] [phone]  Gayland Curry, DO  Patient Care Team: Gayland Curry, DO as PCP - General (Geriatric Medicine) [place], Peter M, MD as PCP - Cardiology (Cardiology) [place], Peter M, MD as Attending Physician (Cardiology) [facility] Rosezella [place], MD as Consulting Physician (Neurology)  Extended Emergency Contact Information Primary Emergency Contact: [place] of [place] Phone: [phone] Mobile Phone: [phone] Relation: Relative Secondary Emergency Contact: [place] of [place] Phone: [phone] Mobile Phone: [phone] Relation: None  Code Status: DNR  Goals of care: Advanced Directive information Advanced Directives 11/30/2019  Does Patient Have a Medical Advance Directive? Yes  Type of Advance Directive -  Does patient want to make changes to medical advance directive? -  Copy of [place] in Chart? -  Would patient like information on creating a medical advance directive? -  Pre-existing out of facility DNR order (yellow form or pink MOST form) -     Chief Complaint  Patient presents with  . Hospitalization Follow-up    oral pain, genital pain    HPI:  Pt is a 86 y.o. female seen today for oral and genital pain s/p hospitalization 11/30/19-12/02/19 due to symptomatic anemia. Hgb was 5.7 on 3/9, along with WBC of 1.3, MCV 107.3, Plt 130.  FOBT was negative. She was transfused with 3 units of PRBCs and recommended a bone marrow bx which she declined because she would not want treatment for the condition. It was recommended that she follow up with the cancer center and received transfusions to maintain the Hgb >8 in the outpatient setting. Prior to this hospitalization she has a prior hospitalization for GIB and underwent endoscopy on 1/12 which  showed esophagitis and single angiodysplastic lesion in the stomach s/p coagulation with argon plasma.    For the past two months she has been experiencing painful ulcers to her mouth and throat. She has not been eating well and losing weight. She has chronic back pain but no painful or swollen joints or rashes. She reports no change in vision but sees shadows sometimes and has a hx of visual migraines. She had a bx of the oral lesion which showed inflamed tissue with granulation and necroinflammatory debris. She has tried magic mouth wash, triamcinolone paste, and lidocaine with no relief. She tried prednisone taper on 3/8 and has found no relief. As of two days ago (3/13) she has a painful lesions to the labia area and one to the rectal area that are dark and tender. Noted 11/26/19: ESR 107 CRP >5.31  She feels swollen and has gas pain. LBM 3/14.  She is using percocet for pain with no relief. Feels weaker and requires more assistance.   Afib rate is controlled. Not on eliquis at this time. No active bleeding. Vitals ok.  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 Seen by Dr. Christinia Gully with no bx recommended due to her goals of care.   Past Medical History:  Diagnosis Date  . A-fib ([place])   . Abdominal pain, unspecified site   . Abnormality of gait 06/02/2013  . Arthritis   . Cataracts, bilateral   . Depression   . Diverticulosis of colon (without mention of hemorrhage)   . Diverticulosis of colon (without mention of hemorrhage) 10/27/2012  . Edema 10/27/2012  . GI bleed 06/2002  .  Hemorrhage of gastrointestinal tract, unspecified   . History of stomach ulcers   . Hypertension   . Insomnia, unspecified 10/27/2012  . Localized osteoarthrosis not specified whether primary or secondary, lower leg 05/31/2011  . Migraines   . Nausea alone 10/28/2012  . Obesity, unspecified 10/27/2012  . Osteoarthrosis, unspecified whether generalized or localized, lower leg   . Other  acariasis    peripherial neuropathy  . Other and unspecified alcohol dependence, continuous drinking behavior 10/28/2012  . Palpitations   . PVC (premature ventricular contraction)   . Unspecified hereditary and idiopathic peripheral neuropathy   . UTI (lower urinary tract infection)    pt state uti w/ no pain   Past Surgical History:  Procedure Laterality Date  . ARTHROSCOPIC REPAIR ACL    . BREAST BIOPSY  1973   left  . BREAST REDUCTION SURGERY  12/23/2003  . cataracts    . CHOLECYSTECTOMY  2005  . ESOPHAGOGASTRODUODENOSCOPY (EGD) WITH PROPOFOL N/A 10/05/2019   Procedure: ESOPHAGOGASTRODUODENOSCOPY (EGD) WITH PROPOFOL;  Surgeon: Wonda Horner, MD;  Location[facility] ENDOSCOPY;  Service: Endoscopy;  Laterality: N/A;  . HOT HEMOSTASIS N/A 10/05/2019   Procedure: HOT HEMOSTASIS (ARGON PLASMA COAGULATION/BICAP);  Surgeon: Wonda Horner, MD;  Location: [facility] ENDOSCOPY;  Service: Endoscopy;  Laterality: N/A;  . TONSILLECTOMY AND ADENOIDECTOMY  1941    Allergies  Allergen Reactions  . Nickel Other (See Comments)    inflammation  . Other Itching    Acrylic nails, and trace metals    Outpatient Encounter Medications as of 12/06/2019  Medication Sig  . Colchicine 0.6 MG CAPS Take 0.6 mg by mouth in the morning and at bedtime.  . lactose free nutrition (BOOST) LIQD Take 237 mLs by mouth daily.  Marland Kitchen lidocaine (XYLOCAINE) 2 % jelly Place 1 application into the urethra 3 (three) times daily as needed.  Marland Kitchen morphine (ROXANOL) 20 MG/ML concentrated solution Take 0.25 mLs (5 mg total) by mouth every 4 (four) hours as needed for severe pain.  . nebivolol (BYSTOLIC) 5 MG tablet Take 5 mg by mouth daily.  . sucralfate (CARAFATE) 1 GM/10ML suspension Take 1 g by mouth 4 (four) times daily -  with meals and at bedtime.  . [DISCONTINUED] morphine (ROXANOL) 20 MG/ML concentrated solution Take 5 mg by mouth every 4 (four) hours as needed for severe pain.  . bisacodyl (DULCOLAX) 10 MG suppository Place 10 mg  rectally as needed for moderate constipation.  . flecainide (TAMBOCOR) 50 MG tablet TAKE 1 TABLET BY MOUTH TWICE DAILY FOR ATRIAL FIBRILLATION  . furosemide (LASIX) 40 MG tablet Take 1 tablet (40 mg total) by mouth daily.  Marland Kitchen lidocaine (XYLOCAINE) 2 % solution Use as directed 15 mLs in the mouth or throat every 4 (four) hours as needed for mouth pain. (Patient not taking: Reported on 11/30/2019)  . Melatonin 5 MG TABS Take 5 mg by mouth at bedtime.  . mirtazapine (REMERON) 7.5 MG tablet Take 1 tablet (7.5 mg total) by mouth at bedtime.  . ondansetron (ZOFRAN) 4 MG tablet Take 1 tablet (4 mg total) by mouth every 8 (eight) hours as needed for nausea or vomiting.  . pantoprazole (PROTONIX) 40 MG tablet Take 1 tablet (40 mg total) by mouth 2 (two) times daily.  . phenol (CHLORASEPTIC) 1.4 % LIQD Use as directed 1 spray in the mouth or throat as needed for throat irritation / pain.  . polyethylene glycol (MIRALAX / GLYCOLAX) 17 g packet Take 17 g by mouth daily.  . predniSONE (  DELTASONE) 20 MG tablet Take 20 mg by mouth See admin instructions. Give 16m everyday X4 days, then 314mqday X3days , then 2024md X2days then 63m33m X1 then d/c (Take w/ food)  . senna-docusate (SENOKOT-S) 8.6-50 MG tablet Take 2 tablets by mouth 2 (two) times daily.  . sodium chloride (OCEAN) 0.65 % SOLN nasal spray Place 1 spray into both nostrils 3 (three) times daily as needed for congestion.  . zoMarland Kitchenpidem (AMBIEN) 5 MG tablet Take 1 tablet (5 mg total) by mouth at bedtime.  . [DISCONTINUED] acetaminophen (TYLENOL) 500 MG tablet Take 750-1,000 mg by mouth See admin instructions. Take two tablets by mouth every morning then take 1 1/2 tablet at night  . [DISCONTINUED] allopurinol (ZYLOPRIM) 100 MG tablet Take 100 mg by mouth daily.  . [DISCONTINUED] magic mouthwash w/lidocaine SOLN Take 5 mLs by mouth 4 (four) times daily.  . [DISCONTINUED] nebivolol (BYSTOLIC) 10 MG tablet Take 10 mg by mouth daily.  . [DISCONTINUED]  oxyCODONE-acetaminophen (PERCOCET/ROXICET) 5-325 MG tablet Take 1-2 tablets by mouth every 6 (six) hours as needed for severe pain. 1 tab for moderate pain and 2 tabs for severe pain  . [DISCONTINUED] triamcinolone (KENALOG) 0.1 % paste Use as directed 1 application in the mouth or throat 2 (two) times daily.   No facility-administered encounter medications on file as of 12/06/2019.    Review of Systems  Constitutional: Positive for activity change, appetite change, fatigue and unexpected weight change. Negative for chills, diaphoresis and fever.  HENT: Positive for mouth sores, sore throat and trouble swallowing. Negative for congestion, postnasal drip and rhinorrhea.   Respiratory: Negative for cough, shortness of breath and wheezing.   Cardiovascular: Positive for leg swelling. Negative for chest pain and palpitations.  Gastrointestinal: Positive for abdominal distention (mild ) and abdominal pain (bloating ). Negative for constipation and diarrhea.  Genitourinary: Negative for decreased urine volume, difficulty urinating, dysuria, hematuria and pelvic pain.  Musculoskeletal: Positive for back pain (chronic lumbar. wants different mattress ) and gait problem. Negative for arthralgias, joint swelling and myalgias.  Skin:       Rectal, vaginal, and oral lesions   Neurological: Positive for weakness. Negative for dizziness, tremors, seizures, syncope, facial asymmetry, speech difficulty, light-headedness, numbness and headaches.  Psychiatric/Behavioral: Positive for dysphoric mood. Negative for agitation, behavioral problems, confusion and sleep disturbance.    Immunization History  Administered Date(s) Administered  . Influenza Split 07/23/2013  . Influenza Whole 06/23/2012  . Influenza, High Dose Seasonal PF 07/19/2016, 05/23/2017, 07/02/2019  . Influenza,inj,Quad PF,6+ Mos 06/23/2015, 07/17/2018  . Influenza-Unspecified 07/11/2014  . Pneumococcal Conjugate-13 10/04/2015  . Pneumococcal  Polysaccharide-23 09/23/2010  . Td 09/23/2010  . Zoster 09/23/1998   Pertinent  Health Maintenance Due  Topic Date Due  . INFLUENZA VACCINE  Completed  . DEXA SCAN  Completed  . PNA vac Low Risk Adult  Completed   Fall Risk  11/10/2019 10/20/2019 09/29/2019 08/27/2019 08/05/2019  Falls in the past year? 0 0 0 0 0  Comment - - - - -  Number falls in past yr: 0 0 0 - 0  Injury with Fall? 0 0 0 0 0  Comment - - - - -  Risk for fall due to : - - - - -  Follow up - - - - -   Functional Status Survey:    Vitals:   12/06/19 1056  BP: 128/64  Pulse: 100  Resp: 20  Temp: 98.3 F (36.8 C)  SpO2: 92%  There is no height or weight on file to calculate BMI. Physical Exam Vitals and nursing note reviewed.  Constitutional:      General: She is not in acute distress.    Appearance: She is not diaphoretic.  HENT:     Head: Normocephalic and atraumatic.     Nose: Nose normal. No congestion.     Mouth/Throat:     Mouth: Mucous membranes are moist.     Pharynx: No oropharyngeal exudate.     Comments: White lesions with surrounding erythema to both sides of the tongue. Tongue is swollen . Eyes:     General:        Right eye: No discharge.        Left eye: No discharge.     Conjunctiva/sclera: Conjunctivae normal.     Pupils: Pupils are equal, round, and reactive to light.  Neck:     Vascular: No JVD.  Cardiovascular:     Rate and Rhythm: Normal rate and regular rhythm.     Heart sounds: Murmur (3/6. louder than usual) present.  Pulmonary:     Effort: Pulmonary effort is normal. No respiratory distress.     Breath sounds: Normal breath sounds. No wheezing.  Abdominal:     General: Bowel sounds are normal. There is distension (mild).     Palpations: Abdomen is soft. There is no mass.     Tenderness: There is no abdominal tenderness. There is no right CVA tenderness or left CVA tenderness.  Genitourinary:    Comments: Dark purple/black lesion to left labia area with swelling and  tenderness. Dark purple lesion to rectal area as well. Musculoskeletal:     Cervical back: No rigidity or tenderness.     Right lower leg: Edema (+1) present.     Left lower leg: Edema (+1) present.  Lymphadenopathy:     Cervical: No cervical adenopathy.  Skin:    General: Skin is warm and dry.     Coloration: Skin is pale.     Findings: Bruising (arms and legs) present.  Neurological:     Mental Status: She is alert and oriented to person, place, and time.  Psychiatric:        Mood and Affect: Mood normal.     Labs reviewed: Recent Labs    10/21/19 1057 10/21/19 1057 11/27/19 0000 11/30/19 1237 11/30/19 1639 12/01/19 0435  NA 139  --  136* 136  --  137  K 3.8   < > 4.0 4.0  --  3.8  CL 103   < > 94* 96*  --  99  CO2 28   < > 27* 27  --  29  GLUCOSE 148*  --   --  194*  --  117*  BUN 11  --  33* 25*  --  27*  CREATININE 0.81  --  1.0 0.97  --  1.00  CALCIUM 8.3*  --   --  9.4  --  9.1  MG  --   --   --   --  1.8  --    < > = values in this interval not displayed.   Recent Labs    10/07/19 0237 10/07/19 0237 10/21/19 1057 11/30/19 1237 12/01/19 0435  AST 13*   < > _0 ALT 11   < > _1 ALKPHOS 57  --   --  71 60  BILITOT 1.1   < > 1.0 0.8 1.0  PROT 5.1*   < >  5.6* 6.3* 5.5*  ALBUMIN 2.9*  --   --  3.6 3.1*   < > = values in this interval not displayed.   Recent Labs    09/29/19 0000 10/07/19 0237 10/08/19 0910 10/21/19 1057 10/21/19 1057 11/27/19 0000 11/30/19 1237 11/30/19 1639 12/01/19 0435 12/01/19 1539 12/02/19 0523  WBC   < > 2.2*   < > 1.8*   < > 1.5 1.3*  --  1.4*  --  1.7*  NEUTROABS  --  0.9*  --  677*  --  0  --   --   --   --   --   HGB   < > 9.2*   < > 9.3*   < > 6.1* 5.7*   < > 7.3* 9.6* 8.7*  HCT   < > 27.6*   < > 28.5*   < > 18* 17.6*   < > 21.7* 28.5* 25.9*  MCV  --  91.1   < > 94.1   < >  --  107.3*  --  98.2  --  96.3  PLT   < > 119*   < > 127*   < > 109* 130*  --  104*  --  92*   < > = values in this interval not  displayed.   Lab Results  Component Value Date   TSH 1.348 11/30/2019   Lab Results  Component Value Date   HGBA1C 5.6 09/27/2019   Lab Results  Component Value Date   CHOL 113 09/27/2019   HDL 39 (L) 09/27/2019   LDLCALC 53 09/27/2019   TRIG 129 09/27/2019   CHOLHDL 2.9 09/27/2019    Significant Diagnostic Results in last 30 days:  No results found.  Assessment/Plan 1. Behcet's disease ([place]) Due to oral and genital lesions with elevated ESR , CRP, and inflammation on bx Lesions and pain worsening. Referral to Rheumatology. Colchicine 0.6 mg bid (monitored CBC, may need to change) Complete prednisone taper Carafate 1 gram QID scheduled swish in mouth and swallow. Separate 2 hrs from other meds.  Lidocaine jelly to genital lesions tid prn pain. Continue lidocaine mouth wash as needed. D/C magic mouthwash. Due to severe pain and discomfort not relieved with percocet begin Roxanol 5 mg q 4 prn pain  2. Pancytopenia ([place]) F/U with Heme/onc if able  See advanced care planning  3. Weakness Due to acute illness and pancytopenia.   4. Decreased oral intake Due to oral pain. Labs ordered to assess renal function and electrolytes. Continues on daily boost supplement.  5. Chronic diastolic CHF (congestive heart failure) ([place]) Appears more edematous but lungs are clear. Nurse to obtain weight and report to [facility].  Did not further diurese due to decreased intake. Continue on Lasix 40 mg qd at this time. She has a chronic heart murmur by her account that sounds worse today on exam when compared to last week.   6. Esophagitis Continue protonix 40 mg bid   7. Chronic constipation Continue miralax and senna s. May need to hold if diarrhea ensues after colchicine Ordered simethicone for gas pain.  8. Current moderate episode of major depressive disorder without prior episode (HCC) Continue Remeron 7.5 mg qhs  9. Paroxysmal atrial fibrillation (HCC) Rate is at 100 at this time  murmur is louder, continues on Flecainide and will need to be monitored. Off Eliquis right now, per discharge summary meds from the hospital, however, notes say that it can be restarted likely due to heme neg  stools in the hospital. CBC ordered, Dr Mariea Clonts to f/u in the am.   10. Advanced care planning/discussion Ms. Winton is experiencing severe pain and reduced quality of life due to oral and genital lesions, along with a pancytopenia. She does not want treatment for the pancytopenia which may likely be bone marrow disease. She also has lung nodules concerning for lung ca and does not want a work up for this issue either.  She would like treatment for the oral and genital lesions which could be due to Behcets disease. Her goals of care are comfort based. She is a DNR and wishes never to go back to the hospital again. She is rapidly declining and may be appropriate for hospice in the near future. We have provided a most form for her to fill out and return.  Counseling and coordination 30 minutes was spent in counseling and coordination.   Family/ staff Communication: discussed with resident   Labs/tests ordered:  CBC BMP

## 2019-12-07 ENCOUNTER — Encounter: Payer: Self-pay | Admitting: Internal Medicine

## 2019-12-07 ENCOUNTER — Telehealth: Payer: Self-pay | Admitting: Hematology and Oncology

## 2019-12-07 ENCOUNTER — Non-Acute Institutional Stay (SKILLED_NURSING_FACILITY): Payer: Medicare Other | Admitting: Internal Medicine

## 2019-12-07 DIAGNOSIS — K922 Gastrointestinal hemorrhage, unspecified: Secondary | ICD-10-CM

## 2019-12-07 DIAGNOSIS — Z7189 Other specified counseling: Secondary | ICD-10-CM

## 2019-12-07 DIAGNOSIS — D61818 Other pancytopenia: Secondary | ICD-10-CM | POA: Diagnosis not present

## 2019-12-07 DIAGNOSIS — R627 Adult failure to thrive: Secondary | ICD-10-CM

## 2019-12-07 DIAGNOSIS — R52 Pain, unspecified: Secondary | ICD-10-CM

## 2019-12-07 DIAGNOSIS — K209 Esophagitis, unspecified without bleeding: Secondary | ICD-10-CM

## 2019-12-07 DIAGNOSIS — M352 Behcet's disease: Secondary | ICD-10-CM

## 2019-12-07 DIAGNOSIS — D649 Anemia, unspecified: Secondary | ICD-10-CM | POA: Diagnosis not present

## 2019-12-07 NOTE — Progress Notes (Signed)
Patient ID: [name], female   DOB: 1933/03/05, 86 y.o.   MRN: [id number]  Provider:  Rexene [name], D.O., C.[name].D. Location:  Artist of Service:  SNF (31)  PCP: Martha Curry, DO Patient Care Team: Martha Curry, DO as PCP - General (Geriatric Medicine) Martinique, Peter M, MD as PCP - Cardiology (Cardiology) Martin[place], Peter M, MD as Attending Physician (Cardiology) Community, Martha Rosezella [place], MD as Consulting Physician (Neurology)  Extended Emergency Contact Information Primary Emergency Contact: [name] [place] Phone: [phone] [place] Phone: [phone] Relation: Relative Secondary Emergency Contact: [name] Phone: [phone] [place] Phone: [phone] Relation: None  Code Status: DNR Goals of Care: Advanced Directive information Advanced Directives 12/08/2019  Does Patient Have a Medical Advance Directive? Yes  Type of Paramedic of Ward;Out of facility DNR (pink MOST or yellow form);Living will  Does patient want to make changes to medical advance directive? No - Patient declined  Copy of [place] in Chart? Yes - validated most recent copy scanned in chart (See row information)  Would patient like information on creating a medical advance directive? -  Pre-existing out of facility DNR order (yellow form or pink MOST form) Pink MOST/Yellow Form most recent copy in chart - Physician notified to receive inpatient order      Chief Complaint  Patient presents with  . New Admit To SNF    New Admission  to SNF     HPI: Patient is a 86 y.o. female seen today for admission to [facility] [place] home due to failure to thrive related to ulcerations in her mouth and now new ulcerations of her labia and rectum.  She was recently hospitalized with GI bleeding felt to be due to esophagitis as no  other source could be identified.  She had pancytopenia and was seen by hematology who recommended bone marrow biopsy which she refused b/c she would not pursue treatment for any pathology of her bone marrow with chemotherapy anyway.  She has long expressed comfort care wishes since a lung mass was noted on a screening CT.    Unfortunately, [name] has declined tremendously over the past couple of months.  During her hospital stay with the two endoscopies, she developed the oral ulcerations and her intake has remained poor.  All treatments we've tried to help with her pain have really had limited success:  Peridex, chloraseptic, various other canker sore treatments, magic mouthwash, lidocaine mouthwash, tylenol, nsaids, hydrocodone.  She's even been unable to enjoy her two glasses of scotch nightly.    NP had seen her when nursing noted she had developed a new ulceration of her labia--this has grown from 1.3 to 2.5cm and she now has another small ulcer over a hemorrhoid.  They have a foul necrotic odor and are draining some yellow liquid.  She feels nauseous and has even vomited some today.    She has been giving away her jewelry and gotten her affairs in order.    When I saw her, she had been researching Behcet's disease and wanted to see rheum and be treated for it.  She seemed frustrated and in denial at that point about her prognosis.  She was receiving percocet and struggling to swallow it.  She'd been getting zofran for nausea.  Past Medical History:  Diagnosis Date  . A-fib ([place])   . Abdominal pain,  unspecified site   . Abnormality of gait 06/02/2013  . Arthritis   . Cataracts, bilateral   . Depression   . Diverticulosis of colon (without mention of hemorrhage)   . Diverticulosis of colon (without mention of hemorrhage) 10/27/2012  . Edema 10/27/2012  . GI bleed 06/2002  . Hemorrhage of gastrointestinal tract, unspecified   . History of stomach ulcers   . Hypertension   . Insomnia,  unspecified 10/27/2012  . Localized osteoarthrosis not specified whether primary or secondary, lower leg 05/31/2011  . Migraines   . Nausea alone 10/28/2012  . Obesity, unspecified 10/27/2012  . Osteoarthrosis, unspecified whether generalized or localized, lower leg   . Other acariasis    peripherial neuropathy  . Other and unspecified alcohol dependence, continuous drinking behavior 10/28/2012  . Palpitations   . PVC (premature ventricular contraction)   . Unspecified hereditary and idiopathic peripheral neuropathy   . UTI (lower urinary tract infection)    pt state uti w/ no pain   Past Surgical History:  Procedure Laterality Date  . ARTHROSCOPIC REPAIR ACL    . BREAST BIOPSY  1973   left  . BREAST REDUCTION SURGERY  12/23/2003  . cataracts    . CHOLECYSTECTOMY  2005  . ESOPHAGOGASTRODUODENOSCOPY (EGD) WITH PROPOFOL N/A 10/05/2019   Procedure: ESOPHAGOGASTRODUODENOSCOPY (EGD) WITH PROPOFOL;  Surgeon: Wonda Horner, MD;  Location[facility] ENDOSCOPY;  Service: Endoscopy;  Laterality: N/A;  . HOT HEMOSTASIS N/A 10/05/2019   Procedure: HOT HEMOSTASIS (ARGON PLASMA COAGULATION/BICAP);  Surgeon: Wonda Horner, MD;  Location: [facility] ENDOSCOPY;  Service: Endoscopy;  Laterality: N/A;  . TONSILLECTOMY AND ADENOIDECTOMY  1941    Social History   Socioeconomic History  . Marital status: Widowed    Spouse name: Not on file  . Number of children: Not on file  . Years of education: Not on file  . Highest education level: Not on file  Occupational History  . Occupation: retired Marine scientist  Tobacco Use  . Smoking status: Former Smoker    Packs/day: 1.00    Years: 30.00    Pack years: 30.00    Types: Cigarettes    Quit date: 10/09/1983    Years since quitting: 36.2  . Smokeless tobacco: Never Used  Substance and Sexual Activity  . Alcohol use: Yes    Alcohol/week: 1.0 - 2.0 standard drinks    Types: 1 - 2 Shots of liquor per week    Comment: 2 DRINKS A DAY - daily  scotch   . Drug use: No  . Sexual  activity: Never  Other Topics Concern  . Not on file  Social History Narrative   Lives at [place], in [place] -2004   Stopped smoking 1985   POA, DNR, Living Will   Walks with walker   Alcohol scotch daily    Exercise none   Social Determinants of Health   Financial Resource Strain:   . Difficulty of Paying Living Expenses:   Food Insecurity:   . Worried About Charity fundraiser in the Last Year:   . Arboriculturist in the Last Year:   Transportation Needs:   . Film/video editor (Medical):   Marland Kitchen Lack of Transportation (Non-Medical):   Physical Activity:   . Days of Exercise per Week:   . Minutes of Exercise per Session:   Stress:   . Feeling of Stress :   Social Connections:   . Frequency of Communication with Friends and Family:   .  Frequency of Social Gatherings with Friends and Family:   . Attends Religious Services:   . Active Member of Clubs or Organizations:   . Attends Archivist Meetings:   Marland Kitchen Marital Status:     reports that she quit smoking about 36 years ago. Her smoking use included cigarettes. She has a 30.00 pack-year smoking history. She has never used smokeless tobacco. She reports current alcohol use of about 1.0 - 2.0 standard drinks of alcohol per week. She reports that she does not use drugs.  Functional Status Survey:    Family History  Problem Relation Age of Onset  . Breast cancer Mother   . Hypertension Mother   . Hypertension Father   . Allergic rhinitis Neg Hx   . Asthma Neg Hx     Health Maintenance  Topic Date Due  . TETANUS/TDAP  09/23/2020  . INFLUENZA VACCINE  Completed  . DEXA SCAN  Completed  . PNA vac Low Risk Adult  Completed    Allergies  Allergen Reactions  . Nickel Other (See Comments)    inflammation  . Other Itching    Acrylic nails, and trace metals    Outpatient Encounter Medications as of 12/07/2019  Medication Sig  . lactose free nutrition (BOOST) LIQD Take 237 mLs by mouth daily.  Marland Kitchen  lidocaine (XYLOCAINE) 2 % jelly Place 1 application into the urethra 3 (three) times daily as needed.  Marland Kitchen morphine (ROXANOL) 20 MG/ML concentrated solution Take 0.25-0.5 mLs (5-10 mg total) by mouth every 4 (four) hours as needed for severe pain. 5 mg for moderate pain,  10 mg for severe pain  . [DISCONTINUED] bisacodyl (DULCOLAX) 10 MG suppository Place 10 mg rectally as needed for moderate constipation.  . [DISCONTINUED] Colchicine 0.6 MG CAPS Take 0.6 mg by mouth in the morning and at bedtime.  . [DISCONTINUED] furosemide (LASIX) 40 MG tablet Take 1 tablet (40 mg total) by mouth daily.  . [DISCONTINUED] lidocaine (XYLOCAINE) 2 % solution Use as directed 15 mLs in the mouth or throat every 4 (four) hours as needed for mouth pain.  . [DISCONTINUED] polyethylene glycol (MIRALAX / GLYCOLAX) 17 g packet Take 17 g by mouth daily.  . [DISCONTINUED] Simethicone 125 MG CAPS Take 125 mg by mouth 3 (three) times daily as needed.  . [DISCONTINUED] sucralfate (CARAFATE) 1 GM/10ML suspension Take 1 g by mouth 4 (four) times daily -  with meals and at bedtime.  . [DISCONTINUED] flecainide (TAMBOCOR) 50 MG tablet TAKE 1 TABLET BY MOUTH TWICE DAILY FOR ATRIAL FIBRILLATION  . [DISCONTINUED] Melatonin 5 MG TABS Take 5 mg by mouth at bedtime.  . [DISCONTINUED] mirtazapine (REMERON) 7.5 MG tablet Take 1 tablet (7.5 mg total) by mouth at bedtime.  . [DISCONTINUED] nebivolol (BYSTOLIC) 5 MG tablet Take 5 mg by mouth daily.  . [DISCONTINUED] ondansetron (ZOFRAN) 4 MG tablet Take 1 tablet (4 mg total) by mouth every 8 (eight) hours as needed for nausea or vomiting.  . [DISCONTINUED] pantoprazole (PROTONIX) 40 MG tablet Take 1 tablet (40 mg total) by mouth 2 (two) times daily.  . [DISCONTINUED] phenol (CHLORASEPTIC) 1.4 % LIQD Use as directed 1 spray in the mouth or throat as needed for throat irritation / pain.  . [DISCONTINUED] predniSONE (DELTASONE) 20 MG tablet Take 20 mg by mouth See admin instructions. Give 62m  everyday X4 days, then 353mqday X3days , then 2037md X2days then 36m24m X1 then d/c (Take w/ food)  . [DISCONTINUED] senna-docusate (SENOKOT-S) 8.6-50 MG  tablet Take 2 tablets by mouth 2 (two) times daily.  . [DISCONTINUED] sodium chloride (OCEAN) 0.65 % SOLN nasal spray Place 1 spray into both nostrils 3 (three) times daily as needed for congestion.  . [DISCONTINUED] zolpidem (AMBIEN) 5 MG tablet Take 1 tablet (5 mg total) by mouth at bedtime.   No facility-administered encounter medications on file as of 12/07/2019.    Review of Systems  Constitutional: Positive for malaise/fatigue and weight loss. Negative for chills and fever.  HENT: Negative for congestion.        Pain on tongue from ulcerations  Eyes: Positive for blurred vision and photophobia.  Respiratory: Negative for cough and shortness of breath.   Cardiovascular: Negative for chest pain, palpitations and leg swelling.  Gastrointestinal: Positive for abdominal pain, nausea and vomiting. Negative for blood in stool and melena.       Had diarrhea, but not currently; nauseous; hemorrhoids, ulcer at rectum and labia  Genitourinary: Negative for dysuria.  Musculoskeletal: Negative for back pain and falls.  Skin: Positive for rash.  Neurological: Positive for weakness. Negative for dizziness, loss of consciousness and headaches.  Endo/Heme/Allergies: Bruises/bleeds easily.  Psychiatric/Behavioral: Positive for depression. Negative for memory loss. The patient is nervous/anxious and has insomnia.     Vitals:   12/07/19 1035  BP: (!) 125/51  Pulse: 80  Temp: 99.3 F (37.4 C)  SpO2: 95%  Weight: 187 lb 6.6 oz (85 kg)  Height: '5\' 7"'  (1.702 [name])   Body mass index is 29.35 kg/[name]. Physical Exam Vitals and nursing note reviewed.  Constitutional:      General: She is in acute distress.     Appearance: She is ill-appearing.     Comments: Pale, grayish skin tone, weak  HENT:     Head: Normocephalic and atraumatic.     Right  Ear: External ear normal.     Left Ear: External ear normal.     Nose: Nose normal.     Mouth/Throat:     Comments: Two large gray ulcerations of tongue Eyes:     Extraocular Movements: Extraocular movements intact.     Conjunctiva/sclera: Conjunctivae normal.     Pupils: Pupils are equal, round, and reactive to light.  Cardiovascular:     Rate and Rhythm: Rhythm irregular.  Pulmonary:     Effort: Pulmonary effort is normal.     Breath sounds: Normal breath sounds.  Abdominal:     General: Bowel sounds are normal. There is no distension.     Palpations: Abdomen is soft. There is no mass.     Tenderness: There is abdominal tenderness.  Musculoskeletal:        General: Normal range of motion.     Right lower leg: No edema.     Left lower leg: No edema.  Lymphadenopathy:     Cervical: No cervical adenopathy.  Skin:    Capillary Refill: Capillary refill takes less than 2 seconds.     Comments: Large necrotic ulceration of labia with severe swelling of labia, second small laceration again with necrotic area over hemorrhoid at rectum; draining light yellow foul smelling discharge from both and making her extremely uncomfortable when moved  Neurological:     General: No focal deficit present.     Cranial Nerves: No cranial nerve deficit.     Motor: Weakness present.  Psychiatric:     Comments: Down, angry, frustrated     Labs reviewed: Basic Metabolic Panel: Recent Labs    10/21/19 1057 10/21/19 1057  11/27/19 0000 11/30/19 1237 11/30/19 1639 12/01/19 0435  NA 139  --  136* 136  --  137  K 3.8   < > 4.0 4.0  --  3.8  CL 103   < > 94* 96*  --  99  CO2 28   < > 27* 27  --  29  GLUCOSE 148*  --   --  194*  --  117*  BUN 11  --  33* 25*  --  27*  CREATININE 0.81  --  1.0 0.97  --  1.00  CALCIUM 8.3*  --   --  9.4  --  9.1  MG  --   --   --   --  1.8  --    < > = values in this interval not displayed.   Liver Function Tests: Recent Labs    10/07/19 0237 10/07/19 0237  10/21/19 1057 11/30/19 1237 12/01/19 0435  AST 13*   < > '11 18 17  ' ALT 11   < > '6 13 11  ' ALKPHOS 57  --   --  71 60  BILITOT 1.1   < > 1.0 0.8 1.0  PROT 5.1*   < > 5.6* 6.3* 5.5*  ALBUMIN 2.9*  --   --  3.6 3.1*   < > = values in this interval not displayed.   Recent Labs    10/03/19 1756  LIPASE 19   No results for input(s): AMMONIA in the last 8760 hours. CBC: Recent Labs    09/29/19 0000 10/07/19 0237 10/08/19 0910 10/21/19 1057 10/21/19 1057 11/27/19 0000 11/30/19 1237 11/30/19 1639 12/01/19 0435 12/01/19 1539 12/02/19 0523  WBC   < > 2.2*   < > 1.8*   < > 1.5 1.3*  --  1.4*  --  1.7*  NEUTROABS  --  0.9*  --  677*  --  0  --   --   --   --   --   HGB   < > 9.2*   < > 9.3*   < > 6.1* 5.7*   < > 7.3* 9.6* 8.7*  HCT   < > 27.6*   < > 28.5*   < > 18* 17.6*   < > 21.7* 28.5* 25.9*  MCV  --  91.1   < > 94.1   < >  --  107.3*  --  98.2  --  96.3  PLT   < > 119*   < > 127*   < > 109* 130*  --  104*  --  92*   < > = values in this interval not displayed.   Cardiac Enzymes: No results for input(s): CKTOTAL, CKMB, CKMBINDEX, TROPONINI in the last 8760 hours. BNP: Invalid input(s): POCBNP Lab Results  Component Value Date   HGBA1C 5.6 09/27/2019   Lab Results  Component Value Date   TSH 1.348 11/30/2019   Lab Results  Component Value Date   VITAMINB12 1,454 (H) 11/30/2019   Lab Results  Component Value Date   FOLATE 9.1 10/05/2019   Lab Results  Component Value Date   IRON 178 (H) 11/30/2019   TIBC 333 11/30/2019   FERRITIN 158 11/30/2019    Assessment/Plan 1. Behcet's disease ([place]) Unfortunately, resident has declined so much with her multiple hospitalizations recently and GI bleeding, that I don't anticipate any of the treatments that rheum would offer her could be of benefit at this point or tolerated (TNF alpha inhibitors, interferon) and  already been put on colchicine and prednisone--she also may not be eligible with her labs as they've been  (pancytopenia for which heme/onc recommended bone marrow biopsy and she politely declined) -roxanol added for pain  She also has a lung mass that she opted not to have biopsied.  At this point, her prognosis appears too poor to make it out to an appt 3/25 or 4/29 with rheum AND she was too weak to do a whirlpool bath today let alone sit up and go out to an appt.    I'[name] waiting on her labs to return and she was wanting this information herself this morning to see if they were continuing to worsen.    When I have this info, I'd like to meet with her again.  She was not willing to say she did not want to go to the hospital at this point until she knew what rheum would say.    2. Pancytopenia ([place]) -likely related to underllying bone marrow pathology which may be source of ulcerations, as Martha -she refused bone marrow biopsy in favor of comfort measures  3. Upper GI bleed -suspect ongoing, but waiting on repeat cbc she wanted done--it will help [place] confirm prognosis and she wants that info  4. Esophagitis -was felt to cause #3, try phenergan for nausea  5. Failure to thrive in adult -appears she is nearing end of life and we'll meet again when I receive the rest of the information she's wanting to have  6. Uncontrolled pain -add roxanol for pain in place of percocet as she cannot swallow the pills now  7.  ACP:  28 mins spent discussing with her, caregiver, nursing re: goals at this point as discussed in detail above  Family/ staff Communication: discussed with snf nurse, nurse managers, DON  Labs/tests ordered: cbc, bmp pending  Jeryl Wilbourn L. Chancy Claros, D.O. [facility] [address], [place] [address] Cell Phone (Mon-Fri 8am-5pm):  [phone] On Call:  [phone] & follow prompts after 5pm & weekends Office Phone:  [phone] Office Fax:  [phone]

## 2019-12-07 NOTE — Telephone Encounter (Signed)
Scheduled per 3/12 staff msg. Called and spoke with Clayburn Pert at [id number] 3/22 appt

## 2019-12-08 ENCOUNTER — Encounter: Payer: Self-pay | Admitting: Internal Medicine

## 2019-12-08 ENCOUNTER — Encounter: Payer: Medicare Other | Admitting: Internal Medicine

## 2019-12-08 ENCOUNTER — Other Ambulatory Visit: Payer: Self-pay

## 2019-12-08 ENCOUNTER — Non-Acute Institutional Stay (SKILLED_NURSING_FACILITY): Payer: Medicare Other | Admitting: Internal Medicine

## 2019-12-08 DIAGNOSIS — M1990 Unspecified osteoarthritis, unspecified site: Secondary | ICD-10-CM | POA: Diagnosis not present

## 2019-12-08 DIAGNOSIS — Z8719 Personal history of other diseases of the digestive system: Secondary | ICD-10-CM | POA: Diagnosis not present

## 2019-12-08 DIAGNOSIS — R531 Weakness: Secondary | ICD-10-CM | POA: Diagnosis not present

## 2019-12-08 DIAGNOSIS — R638 Other symptoms and signs concerning food and fluid intake: Secondary | ICD-10-CM

## 2019-12-08 DIAGNOSIS — F323 Major depressive disorder, single episode, severe with psychotic features: Secondary | ICD-10-CM | POA: Diagnosis not present

## 2019-12-08 DIAGNOSIS — I110 Hypertensive heart disease with heart failure: Secondary | ICD-10-CM | POA: Diagnosis not present

## 2019-12-08 DIAGNOSIS — D500 Iron deficiency anemia secondary to blood loss (chronic): Secondary | ICD-10-CM | POA: Diagnosis not present

## 2019-12-08 DIAGNOSIS — R634 Abnormal weight loss: Secondary | ICD-10-CM | POA: Diagnosis not present

## 2019-12-08 DIAGNOSIS — Z7189 Other specified counseling: Secondary | ICD-10-CM | POA: Diagnosis not present

## 2019-12-08 DIAGNOSIS — D61818 Other pancytopenia: Secondary | ICD-10-CM | POA: Diagnosis not present

## 2019-12-08 DIAGNOSIS — K209 Esophagitis, unspecified without bleeding: Secondary | ICD-10-CM

## 2019-12-08 DIAGNOSIS — G4733 Obstructive sleep apnea (adult) (pediatric): Secondary | ICD-10-CM | POA: Diagnosis not present

## 2019-12-08 DIAGNOSIS — Z515 Encounter for palliative care: Secondary | ICD-10-CM

## 2019-12-08 DIAGNOSIS — K5909 Other constipation: Secondary | ICD-10-CM

## 2019-12-08 DIAGNOSIS — M352 Behcet's disease: Secondary | ICD-10-CM

## 2019-12-08 DIAGNOSIS — I4891 Unspecified atrial fibrillation: Secondary | ICD-10-CM | POA: Diagnosis not present

## 2019-12-08 DIAGNOSIS — R911 Solitary pulmonary nodule: Secondary | ICD-10-CM | POA: Diagnosis not present

## 2019-12-08 DIAGNOSIS — G43909 Migraine, unspecified, not intractable, without status migrainosus: Secondary | ICD-10-CM | POA: Diagnosis not present

## 2019-12-08 DIAGNOSIS — Z6829 Body mass index (BMI) 29.0-29.9, adult: Secondary | ICD-10-CM | POA: Diagnosis not present

## 2019-12-08 DIAGNOSIS — Z8679 Personal history of other diseases of the circulatory system: Secondary | ICD-10-CM | POA: Diagnosis not present

## 2019-12-08 DIAGNOSIS — F1011 Alcohol abuse, in remission: Secondary | ICD-10-CM | POA: Diagnosis not present

## 2019-12-08 DIAGNOSIS — I509 Heart failure, unspecified: Secondary | ICD-10-CM | POA: Diagnosis not present

## 2019-12-08 DIAGNOSIS — G629 Polyneuropathy, unspecified: Secondary | ICD-10-CM | POA: Diagnosis not present

## 2019-12-08 NOTE — Progress Notes (Signed)
Location:  Occupational psychologist of Service:  SNF (31) Provider:  Nettye Flegal L. Mariea Clonts, D.O., C.M.D.  Gayland Curry, DO  Patient Care Team: Gayland Curry, DO as PCP - General (Geriatric Medicine) Martinique, Peter M, MD as PCP - Cardiology (Cardiology) Martinique, Peter M, MD as Attending Physician (Cardiology) [facility], [facility] Rosezella [place], MD as Consulting Physician (Neurology)  Extended Emergency Contact Information Primary Emergency Contact: Darden Palmer [place] of [place] Phone: [phone] Mobile Phone: [phone] Relation: Relative Secondary Emergency Contact: [place] Phone: [phone] Mobile Phone: [phone] Relation: None  Code Status:  DNR, comfort measures Goals of care: Advanced Directive information Advanced Directives 12/08/2019  Does Patient Have a Medical Advance Directive? Yes  Type of Paramedic of [place];Out of facility DNR (pink MOST or yellow form);Living will  Does patient want to make changes to medical advance directive? No - Patient declined  Copy of [place] in Chart? Yes - validated most recent copy scanned in chart (See row information)  Would patient like information on creating a medical advance directive? -  Pre-existing out of facility DNR order (yellow form or pink MOST form) Pink MOST/Yellow Form most recent copy in chart - Physician notified to receive inpatient order   Chief Complaint  Patient presents with  . Acute Visit    labs, rheum appt and prognosis    HPI:  Pt is a 86 y.o. female seen today for an acute visit to discuss persistent anemia on labs (only improve to 7 range after 2 units previously given), that rheum appt not an option due to her severe weakness and overall prognosis at this point, and discuss hospice and more comfort measures.    [name] was quite accepting of the information above when  we met.  She can barely change positions in bed now w/o assistance, has had no food and very little to drink.  She remains nauseous despite zofran and phenergan.  She is still in intense pain in her mouth.  I explained that she could not possibly tolerate there therapies used for Behcets in her current state if she does indeed have this disease.  She accepts this and agrees with hospice and actually asked for Doctor Death by name.  She just wants the pain to go away.  She is ready to go.  She asked me to call her step-daughter and tell her about her prognosis, as well.  When I visited her again after clinic, she asked me to call her attorney and let her know that she was going to pass in the next few days so she could have everything in order.    Past Medical History:  Diagnosis Date  . A-fib ([place])   . Abdominal pain, unspecified site   . Abnormality of gait 06/02/2013  . Arthritis   . Cataracts, bilateral   . Depression   . Diverticulosis of colon (without mention of hemorrhage)   . Diverticulosis of colon (without mention of hemorrhage) 10/27/2012  . Edema 10/27/2012  . GI bleed 06/2002  . Hemorrhage of gastrointestinal tract, unspecified   . History of stomach ulcers   . Hypertension   . Insomnia, unspecified 10/27/2012  . Localized osteoarthrosis not specified whether primary or secondary, lower leg 05/31/2011  . Migraines   . Nausea alone 10/28/2012  . Obesity, unspecified 10/27/2012  . Osteoarthrosis, unspecified whether generalized or localized, lower leg   . Other acariasis  peripherial neuropathy  . Other and unspecified alcohol dependence, continuous drinking behavior 10/28/2012  . Palpitations   . PVC (premature ventricular contraction)   . Unspecified hereditary and idiopathic peripheral neuropathy   . UTI (lower urinary tract infection)    pt state uti w/ no pain   Past Surgical History:  Procedure Laterality Date  . ARTHROSCOPIC REPAIR ACL    . BREAST BIOPSY  1973   left  .  BREAST REDUCTION SURGERY  12/23/2003  . cataracts    . CHOLECYSTECTOMY  2005  . ESOPHAGOGASTRODUODENOSCOPY (EGD) WITH PROPOFOL N/A 10/05/2019   Procedure: ESOPHAGOGASTRODUODENOSCOPY (EGD) WITH PROPOFOL;  Surgeon: Wonda Horner, MD;  Location: [facility] ENDOSCOPY;  Service: Endoscopy;  Laterality: N/A;  . HOT HEMOSTASIS N/A 10/05/2019   Procedure: HOT HEMOSTASIS (ARGON PLASMA COAGULATION/BICAP);  Surgeon: Wonda Horner, MD;  Location: [facility] ENDOSCOPY;  Service: Endoscopy;  Laterality: N/A;  . TONSILLECTOMY AND ADENOIDECTOMY  1941    Allergies  Allergen Reactions  . Nickel Other (See Comments)    inflammation  . Other Itching    Acrylic nails, and trace metals    Outpatient Encounter Medications as of 12/08/2019  Medication Sig  . bisacodyl (DULCOLAX) 10 MG suppository Place 10 mg rectally as needed for moderate constipation.  . lactose free nutrition (BOOST) LIQD Take 237 mLs by mouth daily.  Marland Kitchen lidocaine (XYLOCAINE) 2 % jelly Place 1 application into the urethra 3 (three) times daily as needed.  Marland Kitchen morphine (ROXANOL) 20 MG/ML concentrated solution Take 0.25-0.5 mLs (5-10 mg total) by mouth every 4 (four) hours as needed for severe pain. 5 mg for moderate pain,  10 mg for severe pain   No facility-administered encounter medications on file as of 12/08/2019.    Review of Systems  Constitutional: Positive for malaise/fatigue. Negative for chills and fever.  HENT: Negative for hearing loss.        Severe mouth pain  Eyes: Positive for photophobia.  Respiratory: Negative for cough and shortness of breath.   Cardiovascular: Negative for chest pain, palpitations and leg swelling.  Gastrointestinal: Positive for abdominal pain, nausea and vomiting. Negative for blood in stool, constipation, diarrhea and melena.  Genitourinary: Positive for dysuria.  Musculoskeletal: Negative for falls and joint pain.  Skin: Positive for rash.  Neurological: Negative for dizziness and loss of consciousness.    Endo/Heme/Allergies: Bruises/bleeds easily.  Psychiatric/Behavioral: Positive for depression. Negative for memory loss. The patient has insomnia.     Immunization History  Administered Date(s) Administered  . Influenza Split 07/23/2013  . Influenza Whole 06/23/2012  . Influenza, High Dose Seasonal PF 07/19/2016, 05/23/2017, 07/02/2019  . Influenza,inj,Quad PF,6+ Mos 06/23/2015, 07/17/2018  . Influenza-Unspecified 07/11/2014  . Pneumococcal Conjugate-13 10/04/2015  . Pneumococcal Polysaccharide-23 09/23/2010  . Td 09/23/2010  . Zoster 09/23/1998   Pertinent  Health Maintenance Due  Topic Date Due  . INFLUENZA VACCINE  Completed  . DEXA SCAN  Completed  . PNA vac Low Risk Adult  Completed   Fall Risk  12/08/2019 11/10/2019 10/20/2019 09/29/2019 08/27/2019  Falls in the past year? - 0 0 0 0  Comment - - - - -  Number falls in past yr: 0 0 0 0 -  Injury with Fall? 0 0 0 0 0  Comment - - - - -  Risk for fall due to : - - - - -  Follow up - - - - -   Functional Status Survey:    Vitals:   12/08/19 1733  BP: (!) 113/55  Pulse: 79  Resp: 16  Temp: (!) 96 F (35.6 C)  SpO2: 95%  Weight: 187 lb 6.4 oz (85 kg)  Height: '5\' 7"'  (1.702 m)   Body mass index is 29.35 kg/m. Physical Exam Vitals reviewed.  Constitutional:      Appearance: She is ill-appearing.     Comments: A bit less distressed with morphine, but still admitting to considerable mouth and peri-area pain  HENT:     Mouth/Throat:     Comments: Gray tongue ulcerations Eyes:     Extraocular Movements: Extraocular movements intact.     Conjunctiva/sclera: Conjunctivae normal.     Pupils: Pupils are equal, round, and reactive to light.  Cardiovascular:     Rate and Rhythm: Normal rate and regular rhythm.     Pulses: Normal pulses.     Heart sounds: Normal heart sounds.  Pulmonary:     Effort: Pulmonary effort is normal.     Breath sounds: Normal breath sounds. No wheezing, rhonchi or rales.  Abdominal:      General: Bowel sounds are normal. There is no distension.     Palpations: Abdomen is soft. There is no mass.     Tenderness: There is no abdominal tenderness. There is no guarding or rebound.  Musculoskeletal:     Right lower leg: No edema.     Left lower leg: No edema.  Skin:    Comments: Enlarging ulcerations of labia, rectum and swelling of labia, foul odor remains  Neurological:     Motor: Weakness present.     Gait: Gait abnormal.  Psychiatric:     Comments: Calmer and at ease after knowing the information she wanted     Labs reviewed: Recent Labs    10/21/19 1057 10/21/19 1057 11/27/19 0000 11/30/19 1237 11/30/19 1639 12/01/19 0435  NA 139  --  136* 136  --  137  K 3.8   < > 4.0 4.0  --  3.8  CL 103   < > 94* 96*  --  99  CO2 28   < > 27* 27  --  29  GLUCOSE 148*  --   --  194*  --  117*  BUN 11  --  33* 25*  --  27*  CREATININE 0.81  --  1.0 0.97  --  1.00  CALCIUM 8.3*  --   --  9.4  --  9.1  MG  --   --   --   --  1.8  --    < > = values in this interval not displayed.   Recent Labs    10/07/19 0237 10/07/19 0237 10/21/19 1057 11/30/19 1237 12/01/19 0435  AST 13*   < > '11 18 17  ' ALT 11   < > '6 13 11  ' ALKPHOS 57  --   --  71 60  BILITOT 1.1   < > 1.0 0.8 1.0  PROT 5.1*   < > 5.6* 6.3* 5.5*  ALBUMIN 2.9*  --   --  3.6 3.1*   < > = values in this interval not displayed.   Recent Labs    09/29/19 0000 10/07/19 0237 10/08/19 0910 10/21/19 1057 10/21/19 1057 11/27/19 0000 11/30/19 1237 11/30/19 1639 12/01/19 0435 12/01/19 1539 12/02/19 0523  WBC   < > 2.2*   < > 1.8*   < > 1.5 1.3*  --  1.4*  --  1.7*  NEUTROABS  --  0.9*  --  677*  --  0  --   --   --   --   --   HGB   < > 9.2*   < > 9.3*   < > 6.1* 5.7*   < > 7.3* 9.6* 8.7*  HCT   < > 27.6*   < > 28.5*   < > 18* 17.6*   < > 21.7* 28.5* 25.9*  MCV  --  91.1   < > 94.1   < >  --  107.3*  --  98.2  --  96.3  PLT   < > 119*   < > 127*   < > 109* 130*  --  104*  --  92*   < > = values in this  interval not displayed.   Lab Results  Component Value Date   TSH 1.348 11/30/2019   Lab Results  Component Value Date   HGBA1C 5.6 09/27/2019   Lab Results  Component Value Date   CHOL 113 09/27/2019   HDL 39 (L) 09/27/2019   LDLCALC 53 09/27/2019   TRIG 129 09/27/2019   CHOLHDL 2.9 09/27/2019    Significant Diagnostic Results in last 30 days:  No results found.  Assessment/Plan 1. ACP (advance care planning) -spent 30 mins today with Mrs. Rada during lunch and after clinic reviewing above -fortunately with increased morphine, hospice coming and adding haldol, we were able to get her comfortable when I saw her end of day she was much more peaceful  2. End of life care -morphine increased, haldol added, zofran resumed due to rectal lesion pain with phenergan supp  3. 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 Weakness -severe, comfort measures in place with prognosis of hours to days now  5. Decreased oral intake -no food intake now, minimal liquids, continue comfort care  6. Esophagitis -cont zofran as per hospice  7. Chronic constipation -dulcolax supp given earlier with relief  Family/ staff Communication: discussed with step-daughter  [name], D.O. [facility] [address], [place] [address] Cell Phone (Mon-Fri 8am-5pm):  [phone] On Call:  [phone] & follow prompts after 5pm & weekends Office Phone:  [phone] Office Fax:  [phone]

## 2019-12-09 ENCOUNTER — Ambulatory Visit: Payer: Medicare Other | Admitting: Internal Medicine

## 2019-12-09 NOTE — Progress Notes (Signed)
This encounter was created in error - please disregard.

## 2019-12-10 ENCOUNTER — Ambulatory Visit: Payer: Medicare Other | Admitting: Podiatry

## 2019-12-13 ENCOUNTER — Other Ambulatory Visit: Payer: Medicare Other

## 2019-12-13 ENCOUNTER — Inpatient Hospital Stay: Payer: Medicare Other | Admitting: Hematology and Oncology

## 2019-12-13 ENCOUNTER — Ambulatory Visit: Payer: Medicare Other

## 2019-12-13 NOTE — Progress Notes (Signed)
A user error has taken place: encounter opened in error, closed for administrative reasons.

## 2019-12-14 ENCOUNTER — Other Ambulatory Visit: Payer: Self-pay | Admitting: Internal Medicine

## 2019-12-14 DIAGNOSIS — I10 Essential (primary) hypertension: Secondary | ICD-10-CM

## 2019-12-23 DEATH — deceased

## 2019-12-27 ENCOUNTER — Other Ambulatory Visit: Payer: Self-pay | Admitting: Family

## 2019-12-27 DIAGNOSIS — G4700 Insomnia, unspecified: Secondary | ICD-10-CM

## 2020-01-25 NOTE — Progress Notes (Deleted)
Cardiology Office Note   Date:  01/25/2020   ID:  [name], DOB [birth year], MRN [id number]  PCP:  Gayland Curry, DO  Cardiologist:  Raechel Marcos [place], MD EP: None  No chief complaint on file.     History of Present Illness: [name] is a 86 y.o. female PMH of chronic diastolic CHF, paroxysmal atrial fibrillation, HTN, PVCs, mild-moderate AI, and  diagnosed lung mass c/f malignancy for which she is not undergoing any further work-up/management, who presents for follow-up of her CHF.  She was last evaluated by [name] 09/13/2019, at which time she had complaints of LE edema, SOB, and orthopnea despite low does lasix. She was recommended to increase her lasix to 75m daily, monitor weights, and limit salt intake.  Echo i showed EF 60-65%, no RWMA, G2DD, and AV sclerosis and mild AI..Marland Kitchen She was admitted to the hospital from Jan 10-15, 2021 with an upper GI bleed. Dark stools. Hgb dropped to 4.9. she received a total of 6 units of blood. Upper EGD showed a single nonbleeding angioplastic lesion that was cauterized. Lasix and ARB were held. Her Eliquis was held. At DC she was ordered Xarelto but at the PE dose of 15 mg bid. Later as outpatient we changed this to Eliquis.   She was admitted in March with pancytopenia. Hgb down to 5.7. WBC and plts also reduced. Stool heme negative. Seen by oncology and bone marrow biopsy recommended but patient declined. Transfused with goal Hgb >8. Also being treated for oral ulcers.  On follow up today she notes increased edema and some SOB. Lasix was just resumed on Wednesday. She has a very sore mouth. She was previously taking Ibuprofen but has discontinued this.     Past Medical History:  Diagnosis Date  . [place]   . Abdominal pain, unspecified site   . Abnormality of gait 06/02/2013  . Arthritis   . Cataracts, bilateral   . Depression   . Diverticulosis of colon (without mention of hemorrhage)   . Diverticulosis of colon (without  mention of hemorrhage) 10/27/2012  . Edema 10/27/2012  . GI bleed 06/2002  . Hemorrhage of gastrointestinal tract, unspecified   . History of stomach ulcers   . Hypertension   . Insomnia, unspecified 10/27/2012  . Localized osteoarthrosis not specified whether primary or secondary, lower leg 05/31/2011  . Migraines   . Nausea alone 10/28/2012  . Obesity, unspecified 10/27/2012  . Osteoarthrosis, unspecified whether generalized or localized, lower leg   . Other acariasis    peripherial neuropathy  . Other and unspecified alcohol dependence, continuous drinking behavior 10/28/2012  . Palpitations   . PVC (premature ventricular contraction)   . Unspecified hereditary and idiopathic peripheral neuropathy   . UTI (lower urinary tract infection)    pt state uti w/ no pain    Past Surgical History:  Procedure Laterality Date  . ARTHROSCOPIC REPAIR ACL    . BREAST BIOPSY  1973   left  . BREAST REDUCTION SURGERY  12/23/2003  . cataracts    . CHOLECYSTECTOMY  2005  . ESOPHAGOGASTRODUODENOSCOPY (EGD) WITH PROPOFOL N/A 10/05/2019   Procedure: ESOPHAGOGASTRODUODENOSCOPY (EGD) WITH PROPOFOL;  Surgeon: GWonda Horner MD;  [facility];  Service: Endoscopy;  Laterality: N/A;  . HOT HEMOSTASIS N/A 10/05/2019   Procedure: HOT HEMOSTASIS (ARGON PLASMA COAGULATION/BICAP);  Surgeon: GWonda Horner MD;  [facility];  Service: Endoscopy;  Laterality: N/A;  . TONSILLECTOMY AND ADENOIDECTOMY  1941  Current Outpatient Medications  Medication Sig Dispense Refill  . bisacodyl (DULCOLAX) 10 MG suppository Place 10 mg rectally as needed for moderate constipation.    . haloperidol (HALDOL) 5 MG tablet Take 5 mg by mouth every 6 (six) hours as needed.    . lactose free nutrition (BOOST) LIQD Take 237 mLs by mouth daily.    Marland Kitchen lidocaine (XYLOCAINE) 2 % jelly Place 1 application into the urethra 3 (three) times daily as needed.    Marland Kitchen morphine (ROXANOL) 20 MG/ML concentrated solution Take 0.25-0.5 mLs  (5-10 mg total) by mouth every 4 (four) hours as needed for severe pain. 5 mg for moderate pain,  10 mg for severe pain 30 mL 0  . promethazine (PHENERGAN) 25 MG suppository Place 25 mg rectally every 6 (six) hours as needed.     No current facility-administered medications for this visit.    Allergies:   Nickel and Other    Social History:  The patient  reports that she quit smoking about 36 years ago. Her smoking use included cigarettes. She has a 30.00 pack-year smoking history. She has never used smokeless tobacco. She reports current alcohol use of about 1.0 - 2.0 standard drinks of alcohol per week. She reports that she does not use drugs.   Family History:  The patient's family history includes Breast cancer in her mother; Hypertension in her father and mother.    ROS:  Please see the history of present illness.   Otherwise, review of systems are positive for none.   All other systems are reviewed and negative.    PHYSICAL EXAM: VS:  LMP  (LMP Unknown)  , BMI There is no height or weight on file to calculate BMI. GEN: Well nourished, well developed, in no acute distress HEENT: sclera anicteric, white thickened plaques of leukoplakia on tongue. Neck: no JVD, carotid bruits, or masses. 1+ edema. Cardiac: RRR; no murmurs, rubs, or gallops,no edema  Respiratory:  clear to auscultation bilaterally, normal work of breathing GI: soft, nontender, nondistended, + BS MS: no deformity or atrophy Skin: warm and dry, no rash Neuro:  Strength and sensation are intact Psych: euthymic mood, full affect   EKG:  EKG is ordered today. The ekg ordered today demonstrates sinus rhythm with 1st degree AV block, rate 74 bpm, LVH with QRS widening and repolarization abnormality. (LBBB pattern)I have personally reviewed and interpreted this study.    Recent Labs: 09/22/2019: BNP 397.9 11/30/2019: Magnesium 1.8; TSH 1.348 12/01/2019: ALT 11; BUN 27; Creatinine, Ser 1.00; Potassium 3.8; Sodium  137 12/02/2019: Hemoglobin 8.7; Platelets 92    Lipid Panel    Component Value Date/Time   CHOL 113 09/27/2019 1053   TRIG 129 09/27/2019 1053   HDL 39 (L) 09/27/2019 1053   CHOLHDL 2.9 09/27/2019 1053   LDLCALC 53 09/27/2019 1053      Wt Readings from Last 3 Encounters:  12/08/19 187 lb 6.4 oz (85 kg)  12/08/19 187 lb 6.4 oz (85 kg)  12/07/19 187 lb 6.6 oz (85 kg)      Other studies Reviewed: Additional studies/ records that were reviewed today include:   Echocardiogram 02/2017: Study Conclusions  - Left ventricle: The cavity size was normal. Wall thickness wasincreased in a pattern of moderate LVH. There was focal basalhypertrophy. Systolic function was normal. The estimated ejectionfraction was in the range of 55% to 60%. Wall motion was normal;there were no regional wall motion abnormalities. Dopplerparameters are consistent with abnormal left ventricularrelaxation (grade 1 diastolic dysfunction). -  Aortic valve: Mildly calcified annulus. There was mild to moderate regurgitation. Mitral valve: Valve area by pressure half-time: 1.88 cm^2.  Echo 09/22/19: IMPRESSIONS    1. Left ventricular ejection fraction, by visual estimation, is 60 to 65%. The left ventricle has normal function. There is moderately increased left ventricular hypertrophy.  2. Elevated left ventricular end-diastolic pressure.  3. Left ventricular diastolic parameters are consistent with Grade II diastolic dysfunction (pseudonormalization).  4. The left ventricle has no regional wall motion abnormalities.  5. Global right ventricle has normal systolic function.The right ventricular size is normal. No increase in right ventricular wall thickness.  6. Left atrial size was moderately dilated.  7. Right atrial size was normal.  8. The mitral valve is normal in structure. Trivial mitral valve regurgitation. No evidence of mitral stenosis.  9. The tricuspid valve is normal in structure. 10. The  aortic valve is normal in structure. Aortic valve regurgitation is mild. Mild to moderate aortic valve sclerosis/calcification without any evidence of aortic stenosis. 11. The pulmonic valve was normal in structure. Pulmonic valve regurgitation is not visualized. 12. Aneurysm of the ascending aorta. 13. There is mild dilatation of the ascending aorta measuring 41 mm. 14. The inferior vena cava is normal in size with greater than 50% respiratory variability, suggesting right atrial pressure of 3 mmHg.     ASSESSMENT AND PLAN:  1. Acute on chronic diastolic CHF: Echo in December showed gr 2 diastolic dysfunction. Now mildly volume overloaded due to recent hospitalization with transfusion of 6 units of blood.  - Recommend resuming  lasix 65m daily  - Continue nebivolol - ARB discontinued - We discussed the importance of a low sodium diet and monitoring her weight daily.   2. Paroxysmal atrial fibrillation: She is in sinus rhythm today - Continue flecainide for rhythm control - Continue nebivolol for rate control - Continue eliquis for stroke ppx.  - recommend she stop taking Xarelto ( on wrong dose anyway)  3. HTN: BP controlled.  - Continue nebivolol   4. History of  UGI bleed. Hgb improved to 9.3. eliquis resumed. Monitor for recurrent bleeding. If she has recurrent bleeding may need to stay off anticoagulation.    5. PVCs: no complaints of palpitations. No PVCs on EKG today.  - Continue nebivolol  6. Pancytopenia. Per primary care.   7. Lung lesion ? Cancer. Patient does not want this rechecked because she doesn't plan on treating.   8. Leukoplakia. Will try Magic mouthwash for one week. If no better consider seeing ENT.    Current medicines are reviewed at length with the patient today.  The patient does not have concerns regarding medicines.  The following changes have been made:  no change  Labs/ tests ordered today include:   No orders of the defined types were  placed in this encounter.    Disposition:   FU with Dr. JMartiniquein  3 months  Signed, Tinna Kolker JMartinique MD  01/25/2020 10:21 AM

## 2020-01-31 ENCOUNTER — Ambulatory Visit: Payer: Medicare Other | Admitting: Cardiology

## 2020-08-08 ENCOUNTER — Encounter: Payer: Medicare Other | Admitting: Nurse Practitioner
